# Patient Record
Sex: Male | Born: 1950
Health system: Southern US, Community
[De-identification: ages and names within clinical notes are randomized; demographics above are authoritative.]

## PROBLEM LIST (undated history)

## (undated) ENCOUNTER — Emergency Department (HOSPITAL_BASED_OUTPATIENT_CLINIC_OR_DEPARTMENT_OTHER): Admission: EM | Payer: Medicare HMO

## (undated) DIAGNOSIS — F419 Anxiety disorder, unspecified: Secondary | ICD-10-CM

## (undated) DIAGNOSIS — I10 Essential (primary) hypertension: Secondary | ICD-10-CM

## (undated) DIAGNOSIS — Z8673 Personal history of transient ischemic attack (TIA), and cerebral infarction without residual deficits: Secondary | ICD-10-CM

## (undated) DIAGNOSIS — K5792 Diverticulitis of intestine, part unspecified, without perforation or abscess without bleeding: Secondary | ICD-10-CM

## (undated) DIAGNOSIS — B029 Zoster without complications: Secondary | ICD-10-CM

## (undated) DIAGNOSIS — G5601 Carpal tunnel syndrome, right upper limb: Secondary | ICD-10-CM

## (undated) DIAGNOSIS — R519 Headache, unspecified: Secondary | ICD-10-CM

## (undated) DIAGNOSIS — Z8719 Personal history of other diseases of the digestive system: Secondary | ICD-10-CM

## (undated) DIAGNOSIS — K602 Anal fissure, unspecified: Secondary | ICD-10-CM

## (undated) DIAGNOSIS — J309 Allergic rhinitis, unspecified: Secondary | ICD-10-CM

## (undated) DIAGNOSIS — M542 Cervicalgia: Secondary | ICD-10-CM

## (undated) DIAGNOSIS — K219 Gastro-esophageal reflux disease without esophagitis: Secondary | ICD-10-CM

## (undated) DIAGNOSIS — G629 Polyneuropathy, unspecified: Secondary | ICD-10-CM

## (undated) DIAGNOSIS — R51 Headache: Secondary | ICD-10-CM

## (undated) DIAGNOSIS — Z87442 Personal history of urinary calculi: Secondary | ICD-10-CM

## (undated) DIAGNOSIS — E039 Hypothyroidism, unspecified: Secondary | ICD-10-CM

## (undated) DIAGNOSIS — G8929 Other chronic pain: Secondary | ICD-10-CM

## (undated) DIAGNOSIS — G950 Syringomyelia and syringobulbia: Secondary | ICD-10-CM

## (undated) DIAGNOSIS — G4486 Cervicogenic headache: Secondary | ICD-10-CM

## (undated) DIAGNOSIS — K8020 Calculus of gallbladder without cholecystitis without obstruction: Secondary | ICD-10-CM

## (undated) DIAGNOSIS — S83207A Unspecified tear of unspecified meniscus, current injury, left knee, initial encounter: Secondary | ICD-10-CM

## (undated) DIAGNOSIS — M47812 Spondylosis without myelopathy or radiculopathy, cervical region: Secondary | ICD-10-CM

## (undated) DIAGNOSIS — N400 Enlarged prostate without lower urinary tract symptoms: Secondary | ICD-10-CM

## (undated) HISTORY — DX: Diverticulitis of intestine, part unspecified, without perforation or abscess without bleeding: K57.92

## (undated) HISTORY — DX: Hypothyroidism, unspecified: E03.9

## (undated) HISTORY — DX: Anal fissure, unspecified: K60.2

## (undated) HISTORY — DX: Cervicogenic headache: G44.86

## (undated) HISTORY — DX: Zoster without complications: B02.9

## (undated) HISTORY — DX: Headache: R51

## (undated) HISTORY — DX: Anxiety disorder, unspecified: F41.9

## (undated) HISTORY — DX: Calculus of gallbladder without cholecystitis without obstruction: K80.20

## (undated) HISTORY — DX: Other chronic pain: G89.29

## (undated) HISTORY — DX: Carpal tunnel syndrome, right upper limb: G56.01

## (undated) HISTORY — DX: Headache, unspecified: R51.9

## (undated) HISTORY — PX: COLONOSCOPY: SHX174

## (undated) HISTORY — PX: CARDIAC CATHETERIZATION: SHX172

---

## 1990-09-27 HISTORY — PX: HEMORRHOID SURGERY: SHX153

## 2000-01-07 ENCOUNTER — Ambulatory Visit (HOSPITAL_COMMUNITY): Admission: RE | Admit: 2000-01-07 | Discharge: 2000-01-07 | Payer: Self-pay | Admitting: Family Medicine

## 2000-01-07 ENCOUNTER — Encounter: Payer: Self-pay | Admitting: Family Medicine

## 2000-05-10 ENCOUNTER — Observation Stay (HOSPITAL_COMMUNITY): Admission: EM | Admit: 2000-05-10 | Discharge: 2000-05-11 | Payer: Self-pay | Admitting: Emergency Medicine

## 2000-05-10 ENCOUNTER — Encounter: Payer: Self-pay | Admitting: Urology

## 2000-05-10 ENCOUNTER — Encounter: Payer: Self-pay | Admitting: Emergency Medicine

## 2000-05-10 HISTORY — PX: OTHER SURGICAL HISTORY: SHX169

## 2001-04-30 ENCOUNTER — Emergency Department (HOSPITAL_COMMUNITY): Admission: EM | Admit: 2001-04-30 | Discharge: 2001-04-30 | Payer: Self-pay

## 2002-01-22 ENCOUNTER — Encounter: Admission: RE | Admit: 2002-01-22 | Discharge: 2002-02-05 | Payer: Self-pay | Admitting: Orthopedic Surgery

## 2003-01-02 ENCOUNTER — Ambulatory Visit (HOSPITAL_COMMUNITY): Admission: RE | Admit: 2003-01-02 | Discharge: 2003-01-02 | Payer: Self-pay | Admitting: Family Medicine

## 2003-01-02 ENCOUNTER — Encounter: Payer: Self-pay | Admitting: Family Medicine

## 2003-01-30 ENCOUNTER — Encounter (INDEPENDENT_AMBULATORY_CARE_PROVIDER_SITE_OTHER): Payer: Self-pay | Admitting: *Deleted

## 2003-01-30 ENCOUNTER — Ambulatory Visit (HOSPITAL_COMMUNITY): Admission: RE | Admit: 2003-01-30 | Discharge: 2003-01-31 | Payer: Self-pay | Admitting: *Deleted

## 2003-01-30 HISTORY — PX: LAPAROSCOPIC CHOLECYSTECTOMY: SUR755

## 2005-05-03 ENCOUNTER — Ambulatory Visit: Payer: Self-pay | Admitting: Internal Medicine

## 2005-06-15 ENCOUNTER — Ambulatory Visit: Payer: Self-pay | Admitting: Internal Medicine

## 2005-06-29 ENCOUNTER — Ambulatory Visit: Payer: Self-pay | Admitting: Gastroenterology

## 2005-07-14 ENCOUNTER — Ambulatory Visit: Payer: Self-pay | Admitting: Internal Medicine

## 2005-07-14 ENCOUNTER — Ambulatory Visit: Admission: RE | Admit: 2005-07-14 | Discharge: 2005-07-14 | Payer: Self-pay | Admitting: Internal Medicine

## 2005-07-23 ENCOUNTER — Ambulatory Visit: Payer: Self-pay | Admitting: Gastroenterology

## 2005-07-28 LAB — HM COLONOSCOPY

## 2005-08-02 ENCOUNTER — Ambulatory Visit: Payer: Self-pay | Admitting: Gastroenterology

## 2006-05-06 ENCOUNTER — Ambulatory Visit: Payer: Self-pay | Admitting: Cardiology

## 2009-03-24 ENCOUNTER — Encounter: Admission: RE | Admit: 2009-03-24 | Discharge: 2009-06-22 | Payer: Self-pay | Admitting: Family Medicine

## 2010-01-15 ENCOUNTER — Ambulatory Visit: Payer: Self-pay | Admitting: Surgery

## 2010-01-15 ENCOUNTER — Encounter (INDEPENDENT_AMBULATORY_CARE_PROVIDER_SITE_OTHER): Payer: Self-pay | Admitting: Emergency Medicine

## 2010-01-15 ENCOUNTER — Ambulatory Visit: Payer: Self-pay | Admitting: Cardiology

## 2010-01-15 ENCOUNTER — Observation Stay (HOSPITAL_COMMUNITY): Admission: EM | Admit: 2010-01-15 | Discharge: 2010-01-16 | Payer: Self-pay | Admitting: Emergency Medicine

## 2010-01-16 ENCOUNTER — Encounter (INDEPENDENT_AMBULATORY_CARE_PROVIDER_SITE_OTHER): Payer: Self-pay | Admitting: Emergency Medicine

## 2010-01-16 HISTORY — PX: TRANSTHORACIC ECHOCARDIOGRAM: SHX275

## 2010-02-17 ENCOUNTER — Observation Stay (HOSPITAL_COMMUNITY): Admission: EM | Admit: 2010-02-17 | Discharge: 2010-02-19 | Payer: Self-pay | Admitting: Emergency Medicine

## 2010-02-17 ENCOUNTER — Ambulatory Visit: Payer: Self-pay | Admitting: Cardiovascular Disease

## 2010-08-03 ENCOUNTER — Ambulatory Visit (HOSPITAL_COMMUNITY)
Admission: RE | Admit: 2010-08-03 | Discharge: 2010-08-03 | Payer: Self-pay | Source: Home / Self Care | Admitting: Nephrology

## 2010-11-24 DIAGNOSIS — R519 Headache, unspecified: Secondary | ICD-10-CM | POA: Insufficient documentation

## 2010-11-24 DIAGNOSIS — G479 Sleep disorder, unspecified: Secondary | ICD-10-CM | POA: Insufficient documentation

## 2010-11-24 DIAGNOSIS — I679 Cerebrovascular disease, unspecified: Secondary | ICD-10-CM | POA: Insufficient documentation

## 2010-11-24 DIAGNOSIS — R51 Headache: Secondary | ICD-10-CM | POA: Insufficient documentation

## 2010-11-24 DIAGNOSIS — G950 Syringomyelia and syringobulbia: Secondary | ICD-10-CM | POA: Insufficient documentation

## 2010-11-24 DIAGNOSIS — M47812 Spondylosis without myelopathy or radiculopathy, cervical region: Secondary | ICD-10-CM | POA: Insufficient documentation

## 2010-11-24 DIAGNOSIS — R209 Unspecified disturbances of skin sensation: Secondary | ICD-10-CM | POA: Insufficient documentation

## 2010-12-11 ENCOUNTER — Emergency Department (HOSPITAL_COMMUNITY): Payer: 59

## 2010-12-11 ENCOUNTER — Emergency Department (HOSPITAL_COMMUNITY)
Admission: EM | Admit: 2010-12-11 | Discharge: 2010-12-12 | Disposition: A | Discharge status: Home / Self Care | Payer: 59 | Attending: Emergency Medicine | Admitting: Emergency Medicine

## 2010-12-11 DIAGNOSIS — R279 Unspecified lack of coordination: Secondary | ICD-10-CM | POA: Insufficient documentation

## 2010-12-11 DIAGNOSIS — R42 Dizziness and giddiness: Secondary | ICD-10-CM | POA: Insufficient documentation

## 2010-12-11 DIAGNOSIS — Z8673 Personal history of transient ischemic attack (TIA), and cerebral infarction without residual deficits: Secondary | ICD-10-CM | POA: Insufficient documentation

## 2010-12-11 DIAGNOSIS — Z79899 Other long term (current) drug therapy: Secondary | ICD-10-CM | POA: Insufficient documentation

## 2010-12-11 DIAGNOSIS — E039 Hypothyroidism, unspecified: Secondary | ICD-10-CM | POA: Insufficient documentation

## 2010-12-11 DIAGNOSIS — I10 Essential (primary) hypertension: Secondary | ICD-10-CM | POA: Insufficient documentation

## 2010-12-11 DIAGNOSIS — Z7982 Long term (current) use of aspirin: Secondary | ICD-10-CM | POA: Insufficient documentation

## 2010-12-11 DIAGNOSIS — K219 Gastro-esophageal reflux disease without esophagitis: Secondary | ICD-10-CM | POA: Insufficient documentation

## 2010-12-11 HISTORY — DX: Essential (primary) hypertension: I10

## 2010-12-11 LAB — CBC
HCT: 45.4 % (ref 39.0–52.0)
Hemoglobin: 15.9 g/dL (ref 13.0–17.0)
MCH: 32.2 pg (ref 26.0–34.0)
MCHC: 35 g/dL (ref 30.0–36.0)
MCV: 91.9 fL (ref 78.0–100.0)
Platelets: 193 10*3/uL (ref 150–400)
RBC: 4.94 MIL/uL (ref 4.22–5.81)
RDW: 12.3 % (ref 11.5–15.5)
WBC: 6.5 K/uL (ref 4.0–10.5)

## 2010-12-11 LAB — BASIC METABOLIC PANEL
BUN: 12 mg/dL (ref 6–23)
CO2: 29 mEq/L (ref 19–32)
Chloride: 107 mEq/L (ref 96–112)
Creatinine, Ser: 1.28 mg/dL (ref 0.4–1.5)
Glucose, Bld: 111 mg/dL — ABNORMAL HIGH (ref 70–99)

## 2010-12-11 LAB — DIFFERENTIAL
Basophils Absolute: 0.1 K/uL (ref 0.0–0.1)
Basophils Relative: 1 % (ref 0–1)
Eosinophils Absolute: 0.2 10*3/uL (ref 0.0–0.7)
Eosinophils Relative: 2 % (ref 0–5)
Lymphocytes Relative: 31 % (ref 12–46)
Lymphs Abs: 2 10*3/uL (ref 0.7–4.0)
Monocytes Absolute: 0.7 K/uL (ref 0.1–1.0)
Monocytes Relative: 10 % (ref 3–12)
Neutro Abs: 3.7 K/uL (ref 1.7–7.7)
Neutrophils Relative %: 56 % (ref 43–77)

## 2010-12-11 LAB — APTT: aPTT: 28 s (ref 24–37)

## 2010-12-11 LAB — BASIC METABOLIC PANEL WITH GFR
Calcium: 9.1 mg/dL (ref 8.4–10.5)
GFR calc Af Amer: >60 mL/min (ref >60)
GFR calc non Af Amer: 58 mL/min — ABNORMAL LOW (ref >60)
Potassium: 3.8 meq/L (ref 3.5–5.1)
Sodium: 141 meq/L (ref 135–145)

## 2010-12-11 LAB — PROTIME-INR
INR: 0.99 (ref 0.00–1.49)
Prothrombin Time: 13.3 s (ref 11.6–15.2)

## 2010-12-12 ENCOUNTER — Encounter (HOSPITAL_COMMUNITY): Payer: Self-pay | Admitting: Radiology

## 2010-12-12 MED ORDER — IOHEXOL 350 MG/ML SOLN
50.0000 mL | Freq: Once | INTRAVENOUS | Status: AC | PRN
  Administered 2010-12-12: 50 mL via INTRAVENOUS

## 2010-12-14 LAB — BASIC METABOLIC PANEL
BUN: 14 mg/dL (ref 6–23)
CO2: 28 mEq/L (ref 19–32)
Calcium: 9 mg/dL (ref 8.4–10.5)
Calcium: 9.3 mg/dL (ref 8.4–10.5)
Creatinine, Ser: 1.36 mg/dL (ref 0.4–1.5)
Creatinine, Ser: 1.39 mg/dL (ref 0.4–1.5)
GFR calc non Af Amer: 52 mL/min — ABNORMAL LOW (ref >60)
GFR calc non Af Amer: 54 mL/min — ABNORMAL LOW (ref >60)
Glucose, Bld: 101 mg/dL — ABNORMAL HIGH (ref 70–99)
Glucose, Bld: 122 mg/dL — ABNORMAL HIGH (ref 70–99)
Potassium: 4 mEq/L (ref 3.5–5.1)
Sodium: 139 mEq/L (ref 135–145)
Sodium: 142 mEq/L (ref 135–145)

## 2010-12-14 LAB — DIFFERENTIAL
Basophils Absolute: 0 10*3/uL (ref 0.0–0.1)
Basophils Relative: 1 % (ref 0–1)
Monocytes Absolute: 0.6 10*3/uL (ref 0.1–1.0)
Neutro Abs: 5.4 10*3/uL (ref 1.7–7.7)
Neutrophils Relative %: 65 % (ref 43–77)

## 2010-12-14 LAB — CBC
HCT: 48.4 % (ref 39.0–52.0)
Hemoglobin: 16.5 g/dL (ref 13.0–17.0)
Hemoglobin: 16.6 g/dL (ref 13.0–17.0)
Hemoglobin: 17.6 g/dL — ABNORMAL HIGH (ref 13.0–17.0)
MCHC: 34.9 g/dL (ref 30.0–36.0)
MCHC: 35.2 g/dL (ref 30.0–36.0)
MCV: 99 fL (ref 78.0–100.0)
RBC: 4.78 MIL/uL (ref 4.22–5.81)
RDW: 12.6 % (ref 11.5–15.5)
WBC: 8.9 10*3/uL (ref 4.0–10.5)

## 2010-12-14 LAB — TROPONIN I: Troponin I: 0.01 ng/mL (ref 0.00–0.06)

## 2010-12-14 LAB — POCT CARDIAC MARKERS: CKMB, poc: <1 ng/mL — ABNORMAL LOW (ref 1.0–8.0)

## 2010-12-14 LAB — PROTIME-INR: INR: 1.01 (ref 0.00–1.49)

## 2010-12-14 LAB — TSH: TSH: 10.241 u[IU]/mL — ABNORMAL HIGH (ref 0.350–4.500)

## 2010-12-14 LAB — CARDIAC PANEL(CRET KIN+CKTOT+MB+TROPI)
Relative Index: 0.8 (ref 0.0–2.5)
Relative Index: 0.9 (ref 0.0–2.5)
Total CK: 120 U/L (ref 7–232)
Troponin I: 0.01 ng/mL (ref 0.00–0.06)

## 2010-12-14 LAB — LIPID PANEL
Cholesterol: 168 mg/dL (ref 0–200)
HDL: 36 mg/dL — ABNORMAL LOW (ref >39)

## 2010-12-14 LAB — HEPARIN LEVEL (UNFRACTIONATED): Heparin Unfractionated: 0.31 IU/mL (ref 0.30–0.70)

## 2010-12-14 LAB — CK TOTAL AND CKMB (NOT AT ARMC): Relative Index: 0.8 (ref 0.0–2.5)

## 2010-12-15 LAB — COMPREHENSIVE METABOLIC PANEL
AST: 19 U/L (ref 0–37)
BUN: 10 mg/dL (ref 6–23)
CO2: 27 mEq/L (ref 19–32)
Chloride: 104 mEq/L (ref 96–112)
Creatinine, Ser: 1.37 mg/dL (ref 0.4–1.5)
GFR calc non Af Amer: 53 mL/min — ABNORMAL LOW (ref >60)
Glucose, Bld: 102 mg/dL — ABNORMAL HIGH (ref 70–99)
Total Bilirubin: 1.5 mg/dL — ABNORMAL HIGH (ref 0.3–1.2)

## 2010-12-15 LAB — URINALYSIS, ROUTINE W REFLEX MICROSCOPIC
Hgb urine dipstick: NEGATIVE
Nitrite: NEGATIVE
Specific Gravity, Urine: 1.01 (ref 1.005–1.030)
Urobilinogen, UA: 0.2 mg/dL (ref 0.0–1.0)
pH: 7 (ref 5.0–8.0)

## 2010-12-15 LAB — DIFFERENTIAL
Basophils Absolute: 0 10*3/uL (ref 0.0–0.1)
Basophils Relative: 0 % (ref 0–1)
Lymphocytes Relative: 17 % (ref 12–46)
Monocytes Relative: 9 % (ref 3–12)
Neutro Abs: 7.3 10*3/uL (ref 1.7–7.7)
Neutrophils Relative %: 74 % (ref 43–77)

## 2010-12-15 LAB — CK TOTAL AND CKMB (NOT AT ARMC)
CK, MB: 2.1 ng/mL (ref 0.3–4.0)
Relative Index: 1.5 (ref 0.0–2.5)
Total CK: 139 U/L (ref 7–232)

## 2010-12-15 LAB — BASIC METABOLIC PANEL
CO2: 33 mEq/L — ABNORMAL HIGH (ref 19–32)
Calcium: 10.2 mg/dL (ref 8.4–10.5)
Chloride: 113 mEq/L — ABNORMAL HIGH (ref 96–112)
Creatinine, Ser: 1.34 mg/dL (ref 0.4–1.5)
GFR calc non Af Amer: 55 mL/min — ABNORMAL LOW (ref >60)
Potassium: 4.4 mEq/L (ref 3.5–5.1)

## 2010-12-15 LAB — CBC
Hemoglobin: 16.7 g/dL (ref 13.0–17.0)
MCHC: 34.9 g/dL (ref 30.0–36.0)
RBC: 4.91 MIL/uL (ref 4.22–5.81)

## 2010-12-15 LAB — GLUCOSE, CAPILLARY: Glucose-Capillary: 119 mg/dL — ABNORMAL HIGH (ref 70–99)

## 2010-12-15 LAB — LIPID PANEL
Cholesterol: 157 mg/dL (ref 0–200)
LDL Cholesterol: 98 mg/dL (ref 0–99)
Triglycerides: 123 mg/dL (ref <150)

## 2010-12-15 LAB — APTT: aPTT: 27 seconds (ref 24–37)

## 2010-12-15 LAB — TROPONIN I: Troponin I: 0.01 ng/mL (ref 0.00–0.06)

## 2011-01-18 ENCOUNTER — Other Ambulatory Visit (INDEPENDENT_AMBULATORY_CARE_PROVIDER_SITE_OTHER): Payer: 59

## 2011-01-18 ENCOUNTER — Encounter: Payer: Self-pay | Admitting: Endocrinology

## 2011-01-18 ENCOUNTER — Ambulatory Visit (INDEPENDENT_AMBULATORY_CARE_PROVIDER_SITE_OTHER): Payer: 59 | Admitting: Endocrinology

## 2011-01-18 DIAGNOSIS — E039 Hypothyroidism, unspecified: Secondary | ICD-10-CM

## 2011-01-18 DIAGNOSIS — I10 Essential (primary) hypertension: Secondary | ICD-10-CM

## 2011-01-18 DIAGNOSIS — K219 Gastro-esophageal reflux disease without esophagitis: Secondary | ICD-10-CM | POA: Insufficient documentation

## 2011-01-18 DIAGNOSIS — G459 Transient cerebral ischemic attack, unspecified: Secondary | ICD-10-CM | POA: Insufficient documentation

## 2011-01-18 NOTE — Patient Instructions (Addendum)
A thyroid blood test are being ordered for you today.  please call [phone] to hear your test results. Here is a letter to request a 24-hr urine to check for 'adrenaline."  Please try to do on a day when you are most likely to have the symptoms, or high-blood pressure.  You may need to do this several times in order to find the problem, if you have it.  Return here as needed. (update: i left message on phone-tree:  rx as we discussed)

## 2011-01-18 NOTE — Progress Notes (Signed)
  Subjective:    Patient ID: Jacob Kerr, male    DOB: [birth year], 60 y.o.   MRN: [id number]  HPI Pt states approx 20 years h/o hypothyroidism.  The synthroid was most recently increased approx 1 month ago.  Pt states 1 year ago, he had a 20-minute episode of visual loss in both eyes, and assoc htn.  The Crist Infante has not recurred, but he continues to have labile htn.  Past Medical History  Diagnosis Date  . Hypertension   . TIA (transient ischemic attack)    Past Surgical History  Procedure Date  . Cholecystectomy 1998?  Marland Kitchen Hemorrhoid surgery 1992  . Kidney stone surgery 2001    reports that he has never smoked. He does not have any smokeless tobacco history on file. He reports that he drinks alcohol. He reports that he does not use illicit drugs. 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 Allergies  Allergen Reactions  . Sudafed (Pseudoephedrine Hcl)     Increased heart rate    Review of Systems denies flushing, pallor, n/v, syncope, diarrhea, weight loss, sob, birthmarks, palpitations, fever, sob, easy bruising, and rhinorrhea.  He has intermittent headache (no help with labetalol).  He had a syncopal episode a few mos ago.  He has intermittent chest pain (cardiac w/u was neg).  He has anxiety and excessive diaphoresis.  He has chronic low-back pain.       Objective:   Physical Exam VS: see vs page GEN: no distress HEAD: head: no deformity eyes: no periorbital swelling, no proptosis external nose and ears are normal mouth: no lesion seen NECK: supple, thyroid is not enlarged CHEST WALL: no deformity BREASTS:  No gynecomastia CV: reg rate and rhythm, no murmur ABD: abdomen is soft, nontender.  no hepatosplenomegaly.  not distended.  no hernia MUSCULOSKELETAL: muscle bulk  and strength are grossly normal.  no obvious joint swelling.  gait is normal and steady EXTEMITIES: no deformity.  no ulcer on the feet.  feet are of normal color and temp.  no edema PULSES: dorsalis pedis intact bilat.  no carotid bruit NEURO:  cn 2-12 grossly intact.   readily moves all 4's.  sensation is intact to touch on the feet.  No tremor.  SKIN:  Normal texture and temperature.  No rash or suspicious lesion is visible.  There is mild vitiligo on the hands.  Not diaphoretic. NODES:  None palpable at the neck PSYCH: alert, oriented x3.  Does not appear anxious nor depressed  Lab Results  Component Value Date   TSH 0.92 01/18/2011      Assessment & Plan:  Hypothyroidism.   Well-replaced. Htn, labile.  Possibly due to anxiety. Headache, uncertain etiology

## 2011-02-12 NOTE — Op Note (Signed)
[name], Jacob Kerr               ACCOUNT NO.:  [id number]   MEDICAL RECORD NO.:  [id number]          PATIENT TYPE:  OUT   LOCATION:  CARD                         FACILITY:  [facility]   PHYSICIAN:  Oley Balm. Sung Amabile, M.D. [facility] OF BIRTH:  06/08/1951   DATE OF PROCEDURE:  07/14/2005  DATE OF DISCHARGE:  07/14/2005                                 OPERATIVE REPORT   PROCEDURE:  Cardiopulmonary stress test.   INDICATION FOR TESTING:  Dyspnea.   DESCRIPTION OF PROCEDURE:  Cardiopulmonary stress testing was for performed  on a graded treadmill. Testing was stopped due to chest pain and heart rate  goal. Effort was maximal. At peak exercise, oxygen uptake was 31.2  mL/kg/minute or 101% of predicted maximum indicating normal exercise  tolerance.   At peak exercise, heart rate was 175 beats per minute or 104% of predicted  maximum indicating that cardiovascular limitation was reached. Oxygen pulse  was normal suggesting normal stroke volume. Blood pressure response was  normal. EKG tracings revealed no arrhythmias. There were nonspecific ST and  T-wave abnormalities noted during exercise.   At peak exercise, minute ventilation was 105 liters per minute or 71% of  maximum voluntary ventilation indicating that ventilatory reserve remained.  Gas exchange parameters revealed no abnormalities. Baseline spirometry  revealed mild obstruction. Postexercise spirometry revealed no exercise-  induced bronchospasm.   SUMMARY:  Normal exercise tolerance. Nonspecific EKG tracings changes as  noted above. Mild obstruction on spirometry. Otherwise this is a normal  cardiopulmonary stress test. However, it is notable that the patient did  experience chest pain during testing. Clinical correlation is recommended.           ______________________________  Oley Balm. Sung Amabile, M.D. [facility]     DBS/MEDQ  D:  08/24/2005  T:  08/25/2005  Job:  [id number]   cc:   Charlaine Dalton. Sherene Sires, M.D. [facility]  [address]  [place]  [place] [address]   Cardiopulmonary Dept.

## 2011-02-12 NOTE — Op Note (Signed)
   NAMEMASIAH, Jacob Kerr                         ACCOUNT NO.:  [id number]   MEDICAL RECORD NO.:  [id number]                   PATIENT TYPE:  OIB   LOCATION:  5712                                 FACILITY:  [facility]   PHYSICIAN:  Vikki Ports, M.D.         DATE OF BIRTH:  Sep 17, 1951   DATE OF PROCEDURE:  01/30/2003  DATE OF DISCHARGE:                                 OPERATIVE REPORT   PREOPERATIVE DIAGNOSIS:  1. Symptomatic cholelithiasis.  2. INTRAVENOUS PYELOGRAM DYE ALLERGY.   POSTOPERATIVE DIAGNOSES:  1. Symptomatic cholelithiasis.  2. INTRAVENOUS PYELOGRAM DYE ALLERGY.   PROCEDURE:  Laparoscopic cholecystectomy without cholangiogram secondary to  the patient's severe allergy.   ANESTHESIA:  General.   SURGEON:  Vikki Ports, M.D.   ASSISTANT:  Anselm Pancoast. Zachery Dakins, M.D.   DESCRIPTION OF PROCEDURE:  The patient was taken to the operating room and  placed in the supine position.  After adequate anesthesia was induced using  endotracheal tube, the abdomen was prepped and draped in the normal sterile  fashion.  Using a transverse infraumbilical incision, I dissected down to  the fascia.  The fascia was opened vertically.  An 0 Vicryl pursestring  suture was placed around the fascial defect and a Hasson trocar was placed  in the abdomen.  A pneumoperitoneum was obtained and under direct  visualization, a 10 mm port was placed in the subxiphoid region and two 5 mm  ports were placed in the right abdomen.  The gallbladder was identified and  retracted cephalad.  Dissection began near the infundibulum of the  gallbladder and carried down to the neck, where the cystic duct was easily  visualized.  A good window was created posterior to it.  Its junction with  the common duct was also visualized.  It was triply clipped and divided.  A  single cystic artery was also identified, dissected free of surrounding  structures, triply clipped, and divided.  A number of  adhesions were taken  down off the gallbladder wall and the gallbladder was moved off the  gallbladder bed using Bovie electrocautery and removed through the umbilical  port.  Adequate hemostasis was ensured.  Incisions were injected using  Marcaine.  The infraumbilical fascial defect was closed with the 0 Vicryl  pursestring suture.  Pneumoperitoneum was released.  Incisions were injected  with Marcaine and closed with 4-0 subcuticular Monocryl.  Steri-Strips and  sterile dressings were applied.  The patient tolerated the procedure well  and went to PACU in gastric outlet obstruction.                                                Vikki Ports, M.D.    KRH/MEDQ  D:  01/30/2003  T:  01/30/2003  Job:  [id number]

## 2011-02-12 NOTE — Op Note (Signed)
[place]  Patient:    Jacob Kerr, Jacob Kerr                      MRN: [id number] Proc. Date: 05/10/00 Adm. Date:  [id number] Attending:  Lauree Chandler                           Operative Report  PREOPERATIVE DIAGNOSIS:  Distal right ureteral calculus.  POSTOPERATIVE DIAGNOSIS:  Distal right ureteral calculus.  OPERATION PERFORMED:  Cystoscopy, right ureteroscopy and ureteroscopic stone manipulation and removal, right retrograde pyelogram with interpretation and right double-J catheter placement.  SURGEON:  Maretta Bees. Vonita Moss, M.D.  ANESTHESIA:  General.  INDICATIONS FOR PROCEDURE:  This 60 year old gentleman has had recurrent right flank pain [place] and presented to the emergency room today with severe right flank pain and a CT scan showed a 3 mm stone in the distal right ureter with hydronephrosis and pyelocaliceal backflow and rupture.  He had low-grade fever and a low white count and the patient was desirous of having the stone removed at this time because of his severe recurrent symptoms.  DESCRIPTION OF PROCEDURE:  The patient was brought to the operating room and placed in lithotomy position after the induction of satisfactory general anesthesia.  The 3 mm stone seen on KUB in the distal right ureter was seen on fluoroscopy.  The cystoscope was inserted under direct vision.  The anterior urethra was unremarkable.  The prostate was small, the bladder was normal.  A metal guide wire with a softened tip was placed up the right ureter past the stone without difficulty.  The ureteral orifice looked fairly small, so I felt that a balloon dilation was necessary, so a 4 cm ureteral balloon dilator was utilized for inflating this intramural and distal ureter for three minutes at 12 atmospheres of pressure.   With the guide wire in position, I then inserted a 6 French short rigid  ureteroscope and negotiated the intramural ureter and came upon his round, black stone at which point I used the Segura stone basket to grasp the stone and remove it intact and the stone was later given to the patients wife.  At this point I injected the contrast through the ureteroscope and there was some fullness of the upper ureter and mild pyelocaliectasis.  With the guide wire still in place, I backloaded the guide wire into the cystoscope and then inserted a 6 French 28 cm double-J catheter that had a full coil in the renal pelvis and a full coil in the bladder.  The string was brought out per urethra and taped to the penis.  The patient was taken to the recovery room in good condition having tolerated the procedure well. DD:  05/10/00 TD:  05/11/00 Job: [id number]

## 2011-02-16 ENCOUNTER — Telehealth: Payer: Self-pay | Admitting: *Deleted

## 2011-02-16 NOTE — Telephone Encounter (Signed)
Message copied by Brenton Grills on Tue Feb 16, 2011  1:36 PM ------      Message from: Cristy Hilts      Created: Fri Feb 12, 2011  4:10 PM                   ----- Message -----         From: Sheffield Slider, RN         Sent: 02/08/2011  11:46 AM           To: Jacelyn Pi, RN                        ----- Message -----         From: Minus Breeding, MD         Sent: 01/18/2011   3:55 PM           To: Doristine Devoid, CMA            please leave message on phone tree--normal

## 2011-02-16 NOTE — Telephone Encounter (Signed)
Pt informed of results via VM (left detailed message of results and to callback office with any questions or concerns.

## 2011-09-28 HISTORY — PX: OTHER SURGICAL HISTORY: SHX169

## 2012-08-30 LAB — HEPATIC FUNCTION PANEL: AST: 19 U/L (ref 14–40)

## 2012-09-02 LAB — HEMOGLOBIN A1C: Hgb A1c MFr Bld: 5.6 % (ref 4.0–6.0)

## 2012-09-02 LAB — LIPID PANEL: Triglycerides: 130 mg/dL (ref 40–160)

## 2012-11-03 ENCOUNTER — Encounter: Payer: Self-pay | Admitting: Nurse Practitioner

## 2012-11-03 DIAGNOSIS — N400 Enlarged prostate without lower urinary tract symptoms: Secondary | ICD-10-CM

## 2012-11-03 DIAGNOSIS — G629 Polyneuropathy, unspecified: Secondary | ICD-10-CM

## 2012-11-03 DIAGNOSIS — K5731 Diverticulosis of large intestine without perforation or abscess with bleeding: Secondary | ICD-10-CM

## 2013-01-01 ENCOUNTER — Ambulatory Visit (INDEPENDENT_AMBULATORY_CARE_PROVIDER_SITE_OTHER): Payer: 59 | Admitting: Family Medicine

## 2013-01-01 ENCOUNTER — Encounter: Payer: Self-pay | Admitting: Family Medicine

## 2013-01-01 ENCOUNTER — Other Ambulatory Visit: Payer: Self-pay | Admitting: *Deleted

## 2013-01-01 VITALS — BP 115/75 | HR 70 | Temp 97.4°F | Ht 71.0 in | Wt 208.0 lb

## 2013-01-01 DIAGNOSIS — R5381 Other malaise: Secondary | ICD-10-CM

## 2013-01-01 DIAGNOSIS — M2550 Pain in unspecified joint: Secondary | ICD-10-CM

## 2013-01-01 DIAGNOSIS — I10 Essential (primary) hypertension: Secondary | ICD-10-CM

## 2013-01-01 DIAGNOSIS — E785 Hyperlipidemia, unspecified: Secondary | ICD-10-CM | POA: Insufficient documentation

## 2013-01-01 DIAGNOSIS — Z125 Encounter for screening for malignant neoplasm of prostate: Secondary | ICD-10-CM

## 2013-01-01 DIAGNOSIS — G609 Hereditary and idiopathic neuropathy, unspecified: Secondary | ICD-10-CM

## 2013-01-01 DIAGNOSIS — K219 Gastro-esophageal reflux disease without esophagitis: Secondary | ICD-10-CM

## 2013-01-01 DIAGNOSIS — E349 Endocrine disorder, unspecified: Secondary | ICD-10-CM

## 2013-01-01 DIAGNOSIS — G629 Polyneuropathy, unspecified: Secondary | ICD-10-CM

## 2013-01-01 DIAGNOSIS — E291 Testicular hypofunction: Secondary | ICD-10-CM

## 2013-01-01 DIAGNOSIS — E559 Vitamin D deficiency, unspecified: Secondary | ICD-10-CM

## 2013-01-01 LAB — POCT CBC
Granulocyte percent: 64.4 %G (ref 37–80)
HCT, POC: 51.1 % (ref 43.5–53.7)
POC Granulocyte: 3.5 (ref 2–6.9)
RDW, POC: 12.9 %
WBC: 5.4 10*3/uL (ref 4.6–10.2)

## 2013-01-01 LAB — BASIC METABOLIC PANEL
Glucose, Bld: 114 mg/dL — ABNORMAL HIGH (ref 70–99)
Potassium: 4.4 mEq/L (ref 3.5–5.3)
Sodium: 139 mEq/L (ref 135–145)

## 2013-01-01 LAB — HEPATIC FUNCTION PANEL
Bilirubin, Direct: 0.2 mg/dL (ref 0.0–0.3)
Indirect Bilirubin: 1.3 mg/dL — ABNORMAL HIGH (ref 0.0–0.9)
Total Bilirubin: 1.5 mg/dL — ABNORMAL HIGH (ref 0.3–1.2)

## 2013-01-01 NOTE — Addendum Note (Signed)
Addended by: Roselyn Reef on: 01/01/2013 09:14 AM   Modules accepted: Orders

## 2013-01-01 NOTE — Progress Notes (Signed)
  Subjective:    Patient ID: Jacob Kerr, male    DOB: October 28, 1950, 62 y.o.   MRN: [id number]  HPI  This patient presents for recheck of multiple medical problems. No one accompanies the patient today.  Patient Active Problem List  Diagnosis  . Unspecified hypothyroidism  . Unspecified essential hypertension  . Unspecified transient cerebral ischemia  . GERD (gastroesophageal reflux disease)  . BPH (benign prostatic hyperplasia)  . Diverticulosis large intestine w/o perforation or abscess w/bleeding  . Peripheral neuropathy  . Vitamin D deficiency  . Hyperlipidemia    In addition, see review of systems  The allergies, current medications, past medical history, surgical history, family and social history are reviewed.  Immunizations reviewed.  Health maintenance reviewed.  The following items are outstanding: Zostavax       Review of Systems  Constitutional: Negative.   HENT: Positive for neck pain.   Eyes: Negative.   Respiratory: Negative.   Cardiovascular: Negative.   Gastrointestinal: Negative.   Genitourinary: Negative.   Musculoskeletal: Positive for back pain (neck) and arthralgias (R shoulder).  Neurological: Negative.   Psychiatric/Behavioral: Negative.   Neck pain 5/10 Androderm helping Minimal neuropathy right arm and foot Right shoulder blade pain     Objective:   Physical Exam BP 115/75  Pulse 70  Temp(Src) 97.4 F (36.3 C) (Oral)  Ht 5\' 11"  (1.803 m)  Wt 208 lb (94.348 kg)  BMI 29.02 kg/m2  The patient appeared well nourished and normally developed, alert and oriented to time and place. Speech, behavior and judgement appear normal. Vital signs as documented.  Head exam is unremarkable. No scleral icterus or pallor noted. Some rhinitis bilaterally Neck is without jugular venous distension, thyromegally, or carotid bruits. Carotid upstrokes are brisk bilaterally. No cervical adenopathy. Lungs are clear anteriorly and posteriorly to  auscultation. Normal respiratory effort.No axillary adenopathy Cardiac exam reveals regular rate and rhythm @ 84/min. First and second heart sounds normal. No murmurs, rubs or gallops.  Abdominal exam reveals normal bowl sounds, no masses, no organomegaly and no aortic enlargement. No inguinal adenopathy.Prostate smooth and slightly enlarged. Testicles normal. No rectal masses..th Extremities are nonedematous and both femoral and pedal pulses are normal. Skin without pallor or jaundice.  Warm and dry, without rash. Neurologic exam reveals normal deep tendon reflexes and normal sensation.           Assessment & Plan:  1. Essential hypertension, benign - BASIC METABOLIC PANEL WITH GFR; Standing - NMR Lipoprofile with Lipids; Standing  2. Other and unspecified hyperlipidemia - Hepatic function panel; Standing  3. Testosterone deficiency  Psa and testosterone today and q 6 months - POCT CBC; Standing  4. Joint pain - Vitamin D 25 hydroxy; Standing  5. Peripheral neuropathy  6. GERD (gastroesophageal reflux disease) CBC  7. Unspecified essential hypertension BMP  8. Vitamin D deficiency  9. Hyperlipidemia NMR

## 2013-01-01 NOTE — Addendum Note (Signed)
Addended by: Monica Becton on: 01/01/2013 09:35 AM   Modules accepted: Orders

## 2013-01-01 NOTE — Addendum Note (Signed)
Addended by: Orma Render F on: 01/01/2013 09:27 AM   Modules accepted: Orders

## 2013-01-01 NOTE — Patient Instructions (Addendum)
FOBT already has at home, will return Continue current meds and therapeutic lifestyle changes Will schedule eye exam Needs Zostavax

## 2013-01-02 LAB — NMR LIPOPROFILE WITH LIPIDS
Cholesterol, Total: 180 mg/dL (ref <200)
LDL (calc): 115 mg/dL — ABNORMAL HIGH (ref <100)
LDL Particle Number: 1685 nmol/L — ABNORMAL HIGH (ref <1000)
LP-IR Score: 64 — ABNORMAL HIGH (ref <=45)
Large VLDL-P: 2.4 nmol/L (ref <=2.7)
Triglycerides: 158 mg/dL — ABNORMAL HIGH (ref <150)
VLDL Size: 43.4 nm (ref <=46.6)

## 2013-01-03 NOTE — Progress Notes (Signed)
Pt notified of results RX phoned into [facility] for Vit D

## 2013-02-28 ENCOUNTER — Telehealth: Payer: Self-pay | Admitting: Neurology

## 2013-02-28 DIAGNOSIS — G950 Syringomyelia and syringobulbia: Secondary | ICD-10-CM

## 2013-02-28 NOTE — Telephone Encounter (Signed)
This patient has a cervical cord syrinx. He has developed some worsening pain in weakness of the right arm and right leg. He also has sensory alteration on the right. The patient will be set up for MRI evaluation of the cervical spinal cord, and he will have a revisit. The patient is using a TENS unit on the right arm and shoulder.

## 2013-02-28 NOTE — Telephone Encounter (Signed)
Patient called wanting to speak with physician concerning having a MRI scan.

## 2013-03-13 ENCOUNTER — Encounter: Payer: Self-pay | Admitting: Family Medicine

## 2013-03-13 ENCOUNTER — Ambulatory Visit (INDEPENDENT_AMBULATORY_CARE_PROVIDER_SITE_OTHER): Payer: 59 | Admitting: Family Medicine

## 2013-03-13 VITALS — BP 138/86 | HR 67 | Temp 97.7°F | Wt 207.0 lb

## 2013-03-13 DIAGNOSIS — L259 Unspecified contact dermatitis, unspecified cause: Secondary | ICD-10-CM

## 2013-03-13 MED ORDER — METHYLPREDNISOLONE SODIUM SUCC 125 MG IJ SOLR: 125.0000 mg | Freq: Once | INTRAMUSCULAR | Status: DC

## 2013-03-13 MED ORDER — HYDROXYZINE HCL 25 MG PO TABS: 25.0000 mg | ORAL_TABLET | Freq: Three times a day (TID) | ORAL | Status: DC | PRN

## 2013-03-13 MED ORDER — METHYLPREDNISOLONE SODIUM SUCC 125 MG IJ SOLR
125.0000 mg | Freq: Once | INTRAMUSCULAR | Status: AC
  Administered 2013-03-13: 125 mg via INTRAMUSCULAR

## 2013-03-13 MED ORDER — METHYLPREDNISOLONE 4 MG PO KIT: PACK | ORAL | Status: DC

## 2013-03-13 NOTE — Progress Notes (Signed)
  Subjective:    Patient ID: ANCHOR DWAN, male    DOB: 1951/08/18, 62 y.o.   MRN: [id number]  HPI This 62 y.o. male presents for evaluation of rash over back and legs after being bitten by mosquitos yesterday.  He is c/o severe pruritis.     Review of Systems  Constitutional: Negative.   HENT: Negative.   Eyes: Negative.   Respiratory: Negative.   Cardiovascular: Negative.   Gastrointestinal: Negative.   Skin: Positive for rash.        Objective:   Physical Exam  Constitutional: He appears well-developed and well-nourished.  HENT:  Head: Normocephalic and atraumatic.  Right Ear: External ear normal.  Left Ear: External ear normal.  Mouth/Throat: Oropharynx is clear and moist.  Eyes: Conjunctivae are normal. Pupils are equal, round, and reactive to light.  Neck: Normal range of motion. Neck supple.  Cardiovascular: Normal rate and regular rhythm.   Pulmonary/Chest: Effort normal and breath sounds normal.  Skin: Rash noted. There is erythema.  Uriticaria appearing rash over back and legs bilatera.          Assessment & Plan:  Contact dermatitis Solumedrol 125mg  IM today,  Start medrol dose pack tomorrow, atarax 25mg  po tid prn pruritis. Follow up prn if sx's persist.

## 2013-03-13 NOTE — Patient Instructions (Signed)

## 2013-03-21 ENCOUNTER — Ambulatory Visit
Admission: RE | Admit: 2013-03-21 | Discharge: 2013-03-21 | Disposition: A | Discharge status: Home / Self Care | Payer: 59 | Source: Ambulatory Visit | Attending: Neurology | Admitting: Neurology

## 2013-03-21 DIAGNOSIS — R209 Unspecified disturbances of skin sensation: Secondary | ICD-10-CM

## 2013-03-21 DIAGNOSIS — G950 Syringomyelia and syringobulbia: Secondary | ICD-10-CM

## 2013-03-25 ENCOUNTER — Telehealth: Payer: Self-pay | Admitting: Neurology

## 2013-03-25 NOTE — Telephone Encounter (Signed)
I called patient. The patient indicates that he is having right arm and right leg discomfort, with pain into the big toe. MRI of the cervical spine shows multilevel spondylosis, with neuroforaminal stenosis that is significant at the C3-4 and C4-5 levels, but multiple levels are involved. The patient reports pain into the medial aspect of the right shoulder blade, and some pressure sensations into the back of the head. The patient likely has some spasm. The patient is being followed by pain Center, and he received an occipital nerve injection which did help some. The patient does have some slight enlargement of a very small spinal cord syrinx, but I doubt this is causing his current symptoms. I suggested an epidural steroid injection, but the patient does not wish to consider this. I'll try to get him in for a revisit.

## 2013-03-28 ENCOUNTER — Ambulatory Visit (INDEPENDENT_AMBULATORY_CARE_PROVIDER_SITE_OTHER): Payer: 59 | Admitting: Neurology

## 2013-03-28 ENCOUNTER — Encounter: Payer: Self-pay | Admitting: Neurology

## 2013-03-28 VITALS — BP 125/76 | HR 64 | Ht 70.25 in | Wt 202.0 lb

## 2013-03-28 DIAGNOSIS — I679 Cerebrovascular disease, unspecified: Secondary | ICD-10-CM

## 2013-03-28 DIAGNOSIS — G950 Syringomyelia and syringobulbia: Secondary | ICD-10-CM

## 2013-03-28 DIAGNOSIS — R209 Unspecified disturbances of skin sensation: Secondary | ICD-10-CM

## 2013-03-28 DIAGNOSIS — R51 Headache: Secondary | ICD-10-CM

## 2013-03-28 DIAGNOSIS — G479 Sleep disorder, unspecified: Secondary | ICD-10-CM

## 2013-03-28 DIAGNOSIS — M47812 Spondylosis without myelopathy or radiculopathy, cervical region: Secondary | ICD-10-CM

## 2013-03-28 MED ORDER — NORTRIPTYLINE HCL 10 MG PO CAPS: ORAL_CAPSULE | ORAL | Status: DC

## 2013-03-28 NOTE — Progress Notes (Signed)
Reason for visit: Neck discomfort  Jacob Kerr is an 62 y.o. male  History of present illness:  Jacob Kerr is a 62 year old right-handed white male with a history of a small spinal cord syrinx at the C4-C7 levels, and cervical spondylosis. The patient reports a two-year history of gradually worsening discomfort in the right neck, shoulder, with pain radiating into the medial shoulder blade area. The patient denies pain down the arm, and he denies weakness of the arm. The patient has some restriction of cervical spine mobility. The patient has pressure sensations in the base of the skull bilaterally, right greater than left. The patient indicates that he is sleeping fairly well however. The patient is noting a gradual worsening of the pain, and he comes in to this office for an evaluation. The patient in the past has not wished to pursue epidural steroid injections of the cervical spine.  Past Medical History  Diagnosis Date  . Hypertension   . TIA (transient ischemic attack)   . Degenerative disc disease   . Diverticulitis   . Gastritis   . Hypothyroidism   . Benign enlargement of prostate   . Nephrolithiasis   . Cerebrovascular disease   . Cervicogenic headache   . Drowsiness     Excessive daytime drowsiness  . Bilateral occipital neuralgia     Past Surgical History  Procedure Laterality Date  . Cholecystectomy  1998?  Marland Kitchen Hemorrhoid surgery  1992  . Kidney stone surgery  2001    Family History  Problem Relation Age of Onset  . Cancer Father     Prostate Cancer  . Heart disease Father   . Stroke Father   . Nephrolithiasis Sister   . Cancer Sister     Lung Cancer  . Hyperlipidemia Other     FH  . Heart disease Other     FH  . Stroke Other     FH  . Kidney disease Other   . Hypertension Other     FH  . Diabetes Other     Parent, Other Relative  . Heart disease Mother   . Cancer Sister     sinus  . COPD Sister   . Hyperlipidemia Sister   . Hyperlipidemia  Sister     Social history:  reports that he has never smoked. He does not have any smokeless tobacco history on file. He reports that he drinks about 0.6 ounces of alcohol per week. He reports that he does not use illicit drugs.  Allergies:  Allergies  Allergen Reactions  . Sudafed (Pseudoephedrine Hcl)     Increased heart rate  . Epinephrine Palpitations    Medications:  Current Outpatient Prescriptions on File Prior to Visit  Medication Sig Dispense Refill  . ACETYLCARN-ALPHA LIPOIC ACID PO Take 1 tablet by mouth daily.      . Acetylcarnitine HCl (ACETYL-L-CARNITINE HCL) POWD Take 1 capsule by mouth daily.      Marland Kitchen amLODipine (NORVASC) 5 MG tablet Take 5 mg by mouth daily.        Marland Kitchen aspirin 81 MG tablet Take 81 mg by mouth daily.        . Coenzyme Q10 (CO Q 10) 100 MG CAPS Take 1 capsule by mouth daily.      Marland Kitchen levothyroxine (SYNTHROID, LEVOTHROID) 175 MCG tablet Take 175 mcg by mouth daily.        Marland Kitchen olmesartan (BENICAR) 40 MG tablet Take 40 mg by mouth daily.        Marland Kitchen  omeprazole (PRILOSEC) 20 MG capsule Take 20 mg by mouth 2 (two) times daily.       Marland Kitchen testosterone (ANDRODERM) 4 MG/24HR PT24 patch Place 1 patch onto the skin daily.       No current facility-administered medications on file prior to visit.    ROS:  Out of a complete 14 system review of symptoms, the patient complains only of the following symptoms, and all other reviewed systems are negative.  Ringing in the ears Confusion, headache, numbness, weakness Neck and shoulder discomfort  Blood pressure 125/76, pulse 64, height 5' 10.25" (1.784 m), weight 202 lb (91.627 kg).  Physical Exam  General: The patient is alert and cooperative at the time of the examination.  Neuromuscular: The patient lacks about 10-15 of lateral rotation of the cervical spine bilaterally.  Skin: No significant peripheral edema is noted.   Neurologic Exam  Cranial nerves: Facial symmetry is present. Speech is normal, no aphasia or  dysarthria is noted. Extraocular movements are full. Visual fields are full.  Motor: The patient has good strength in all 4 extremities.  Coordination: The patient has good finger-nose-finger and heel-to-shin bilaterally.  Gait and station: The patient has a normal gait. Tandem gait is normal. Romberg is negative. No drift is seen.  Reflexes: Deep tendon reflexes are symmetric.   Assessment/Plan:  1. Cervical spondylosis  2. Cervicogenic headache  3. Cervical spinal cord syrinx  The MRI done recently of the cervical spine does show some slight enlargement of the cervical cord syrinx, but this likely is asymptomatic. The patient has evidence of neuroforaminal stenosis on the right at the C3-4 and C4-5 levels which is the likely source of his underlying chronic cervical spasm and cervicogenic headache. The patient will be set up for physical therapy for neuromuscular therapy, and he will be placed on low-dose nortriptyline. The patient will followup in 4 months. The patient may seek treatment as well through a chiropractor. The patient may benefit from cervical traction.  Marlan Palau MD 03/28/2013 9:20 PM  [facility] [address] [place], [place] [address]  Phone [phone] Fax [phone]

## 2013-04-16 ENCOUNTER — Ambulatory Visit: Payer: 59 | Attending: Neurology | Admitting: Physical Therapy

## 2013-04-16 DIAGNOSIS — IMO0001 Reserved for inherently not codable concepts without codable children: Secondary | ICD-10-CM | POA: Insufficient documentation

## 2013-04-16 DIAGNOSIS — M542 Cervicalgia: Secondary | ICD-10-CM | POA: Insufficient documentation

## 2013-04-16 DIAGNOSIS — M2560 Stiffness of unspecified joint, not elsewhere classified: Secondary | ICD-10-CM | POA: Insufficient documentation

## 2013-04-16 DIAGNOSIS — R5381 Other malaise: Secondary | ICD-10-CM | POA: Insufficient documentation

## 2013-04-18 ENCOUNTER — Ambulatory Visit: Payer: 59 | Admitting: *Deleted

## 2013-04-24 ENCOUNTER — Ambulatory Visit: Payer: 59 | Admitting: Physical Therapy

## 2013-04-26 ENCOUNTER — Ambulatory Visit: Payer: 59 | Admitting: Physical Therapy

## 2013-05-01 ENCOUNTER — Ambulatory Visit: Payer: 59 | Attending: Neurology | Admitting: Physical Therapy

## 2013-05-01 DIAGNOSIS — IMO0001 Reserved for inherently not codable concepts without codable children: Secondary | ICD-10-CM | POA: Insufficient documentation

## 2013-05-01 DIAGNOSIS — R5381 Other malaise: Secondary | ICD-10-CM | POA: Insufficient documentation

## 2013-05-01 DIAGNOSIS — M542 Cervicalgia: Secondary | ICD-10-CM | POA: Insufficient documentation

## 2013-05-01 DIAGNOSIS — M2560 Stiffness of unspecified joint, not elsewhere classified: Secondary | ICD-10-CM | POA: Insufficient documentation

## 2013-05-03 ENCOUNTER — Ambulatory Visit: Payer: 59 | Admitting: Physical Therapy

## 2013-05-08 ENCOUNTER — Ambulatory Visit: Payer: 59

## 2013-05-10 ENCOUNTER — Ambulatory Visit: Payer: 59

## 2013-05-14 ENCOUNTER — Ambulatory Visit: Payer: 59 | Admitting: Physical Therapy

## 2013-05-16 ENCOUNTER — Ambulatory Visit: Payer: 59 | Admitting: Physical Therapy

## 2013-05-22 ENCOUNTER — Ambulatory Visit: Payer: 59 | Admitting: Physical Therapy

## 2013-05-24 ENCOUNTER — Ambulatory Visit: Payer: 59

## 2013-06-08 ENCOUNTER — Ambulatory Visit (INDEPENDENT_AMBULATORY_CARE_PROVIDER_SITE_OTHER): Payer: 59 | Admitting: *Deleted

## 2013-06-08 DIAGNOSIS — M549 Dorsalgia, unspecified: Secondary | ICD-10-CM

## 2013-06-08 LAB — POCT UA - MICROSCOPIC ONLY
Bacteria, U Microscopic: NEGATIVE
Casts, Ur, LPF, POC: NEGATIVE
Crystals, Ur, HPF, POC: NEGATIVE
WBC, Ur, HPF, POC: NEGATIVE
Yeast, UA: NEGATIVE

## 2013-06-08 LAB — POCT URINALYSIS DIPSTICK
Bilirubin, UA: NEGATIVE
Glucose, UA: NEGATIVE
Ketones, UA: NEGATIVE
Leukocytes, UA: NEGATIVE
Nitrite, UA: NEGATIVE
Protein, UA: NEGATIVE
Spec Grav, UA: 1.015
Urobilinogen, UA: NEGATIVE
pH, UA: 5

## 2013-06-16 NOTE — Progress Notes (Signed)
Quick Note:  Call patient. Labs normal. No change in plan. ______ 

## 2013-07-02 ENCOUNTER — Other Ambulatory Visit: Payer: Self-pay

## 2013-07-02 NOTE — Telephone Encounter (Signed)
Last seen 03/13/13  B [place]  If approved print for mail order and route to nurse

## 2013-07-04 ENCOUNTER — Encounter: Payer: Self-pay | Admitting: Family Medicine

## 2013-07-04 ENCOUNTER — Ambulatory Visit (INDEPENDENT_AMBULATORY_CARE_PROVIDER_SITE_OTHER): Payer: 59 | Admitting: Family Medicine

## 2013-07-04 VITALS — BP 148/87 | HR 62 | Temp 97.4°F | Ht 70.25 in | Wt 204.0 lb

## 2013-07-04 DIAGNOSIS — R51 Headache: Secondary | ICD-10-CM

## 2013-07-04 DIAGNOSIS — E291 Testicular hypofunction: Secondary | ICD-10-CM

## 2013-07-04 DIAGNOSIS — I10 Essential (primary) hypertension: Secondary | ICD-10-CM

## 2013-07-04 DIAGNOSIS — E785 Hyperlipidemia, unspecified: Secondary | ICD-10-CM

## 2013-07-04 DIAGNOSIS — Z23 Encounter for immunization: Secondary | ICD-10-CM

## 2013-07-04 DIAGNOSIS — E039 Hypothyroidism, unspecified: Secondary | ICD-10-CM

## 2013-07-04 DIAGNOSIS — N400 Enlarged prostate without lower urinary tract symptoms: Secondary | ICD-10-CM

## 2013-07-04 DIAGNOSIS — K219 Gastro-esophageal reflux disease without esophagitis: Secondary | ICD-10-CM

## 2013-07-04 DIAGNOSIS — G629 Polyneuropathy, unspecified: Secondary | ICD-10-CM

## 2013-07-04 DIAGNOSIS — E559 Vitamin D deficiency, unspecified: Secondary | ICD-10-CM

## 2013-07-04 DIAGNOSIS — G609 Hereditary and idiopathic neuropathy, unspecified: Secondary | ICD-10-CM

## 2013-07-04 DIAGNOSIS — E349 Endocrine disorder, unspecified: Secondary | ICD-10-CM | POA: Insufficient documentation

## 2013-07-04 LAB — POCT CBC
Granulocyte percent: 65.2 % (ref 37–80)
HCT, POC: 50.2 % (ref 43.5–53.7)
Hemoglobin: 17.1 g/dL (ref 14.1–18.1)
Lymph, poc: 1.9 (ref 0.6–3.4)
MCH, POC: 32.1 pg — AB (ref 27–31.2)
MCHC: 34.1 g/dL (ref 31.8–35.4)
MCV: 94.2 fL (ref 80–97)
MPV: 8.7 fL (ref 0–99.8)
POC Granulocyte: 3.8 (ref 2–6.9)
POC LYMPH PERCENT: 33.1 % (ref 10–50)
Platelet Count, POC: 221 10*3/uL (ref 142–424)
RBC: 5.3 M/uL (ref 4.69–6.13)
RDW, POC: 13 %
WBC: 5.8 10*3/uL (ref 4.6–10.2)

## 2013-07-04 LAB — POCT URINALYSIS DIPSTICK
Ketones, UA: NEGATIVE
Leukocytes, UA: NEGATIVE
Nitrite, UA: NEGATIVE
Protein, UA: NEGATIVE
pH, UA: 5

## 2013-07-04 LAB — POCT UA - MICROSCOPIC ONLY
Casts, Ur, LPF, POC: NEGATIVE
Crystals, Ur, HPF, POC: NEGATIVE
Mucus, UA: NEGATIVE
Yeast, UA: NEGATIVE

## 2013-07-04 MED ORDER — TESTOSTERONE 4 MG/24HR TD PT24: 1.0000 | MEDICATED_PATCH | Freq: Every day | TRANSDERMAL | Status: DC

## 2013-07-04 NOTE — Progress Notes (Signed)
Subjective:    Patient ID: Jacob Kerr, male    DOB: 01/11/1951, 62 y.o.   MRN: [id number]  HPI Pt here for follow up and management of chronic medical problems. Health maintenance today in addition to having lab work done the patient will get a PSA, prostate,   and he will return his FOBT. He will also get a flu shot and Prevnar today. He is due to shingle shot and this will be done in one month. Patient sees the neurologist about every 6 months for his syringomyelia. He has some right flank pain not associated with kidney stones especially notable when he arises from a sitting position. A recent chest x-ray from December 13 was reviewed and this was completely within normal limits. See also the review of systems     Patient Active Problem List   Diagnosis Date Noted  . Cervicogenic headache 03/28/2013  . Vitamin D deficiency 01/01/2013  . Hyperlipidemia 01/01/2013  . BPH (benign prostatic hyperplasia) 11/03/2012  . Diverticulosis large intestine w/o perforation or abscess w/bleeding 11/03/2012  . Peripheral neuropathy 11/03/2012  . Unspecified hypothyroidism 01/18/2011  . Unspecified essential hypertension 01/18/2011  . Unspecified transient cerebral ischemia 01/18/2011  . GERD (gastroesophageal reflux disease) 01/18/2011  . Cervical spondylosis without myelopathy 11/24/2010  . Headache(784.0) 11/24/2010  . Sleep disturbance, unspecified 11/24/2010  . Syringomyelia and syringobulbia 11/24/2010  . Disturbance of skin sensation 11/24/2010  . Cerebrovascular disease, unspecified 11/24/2010   Outpatient Encounter Prescriptions as of 07/04/2013  Medication Sig Dispense Refill  . ACETYLCARN-ALPHA LIPOIC ACID PO Take 1 tablet by mouth daily.      Marland Kitchen amLODipine (NORVASC) 5 MG tablet Take 5 mg by mouth daily.        Marland Kitchen aspirin 81 MG tablet Take 81 mg by mouth daily.        . Coenzyme Q10 (CO Q 10) 100 MG CAPS Take 1 capsule by mouth daily.      Marland Kitchen levothyroxine (SYNTHROID, LEVOTHROID)  175 MCG tablet Take 175 mcg by mouth daily.        . nortriptyline (PAMELOR) 10 MG capsule One tablet at night for one week, then take 2 tablets at night  60 capsule  3  . olmesartan (BENICAR) 40 MG tablet Take 40 mg by mouth daily.        Marland Kitchen omeprazole (PRILOSEC) 20 MG capsule Take 20 mg by mouth 2 (two) times daily.       . [DISCONTINUED] testosterone (ANDRODERM) 4 MG/24HR PT24 patch Place 1 patch onto the skin daily.      . [DISCONTINUED] Acetylcarnitine HCl (ACETYL-L-CARNITINE HCL) POWD Take 1 capsule by mouth daily.      . [DISCONTINUED] Alpha-Lipoic Acid 100 MG CAPS Take 100 mg by mouth daily.      . [DISCONTINUED] Vitamin D, Ergocalciferol, (DRISDOL) 50000 UNITS CAPS Take 50,000 Units by mouth once a week.       No facility-administered encounter medications on file as of 07/04/2013.    Review of Systems  Constitutional: Negative.   Eyes: Negative.   Respiratory: Negative.   Cardiovascular: Negative.   Gastrointestinal: Negative.   Endocrine: Negative.   Genitourinary: Negative.   Musculoskeletal: Positive for myalgias (right mid-back pain).  Skin: Negative.   Allergic/Immunologic: Negative.   Neurological: Positive for headaches (low head and neck pain- everyday).  Hematological: Negative.   Psychiatric/Behavioral: Negative.        Objective:   Physical Exam  Nursing note and vitals reviewed. Constitutional: He is oriented to  person, place, and time. He appears well-developed and well-nourished. No distress.  HENT:  Head: Normocephalic and atraumatic.  Right Ear: External ear normal.  Left Ear: External ear normal.  Nose: Nose normal.  Mouth/Throat: Oropharynx is clear and moist. No oropharyngeal exudate.  Eyes: Conjunctivae and EOM are normal. Pupils are equal, round, and reactive to light. Right eye exhibits no discharge. Left eye exhibits no discharge. No scleral icterus.  Neck: Normal range of motion. Neck supple. No thyromegaly present.  There were no bruits in the  neck  Cardiovascular: Normal rate, regular rhythm, normal heart sounds and intact distal pulses.  Exam reveals no gallop and no friction rub.   No murmur heard. At 72 per minute  Pulmonary/Chest: Effort normal and breath sounds normal. No respiratory distress. He has no wheezes. He has no rales.  Abdominal: Soft. Bowel sounds are normal. He exhibits no mass. There is no tenderness. There is no rebound and no guarding.  Genitourinary: Rectum normal and penis normal.  The prostate gland was slightly enlarged left greater than right. There were no labs. There were no rectal masses . There was no hernia.  Musculoskeletal: Normal range of motion. He exhibits no edema and no tenderness.  Lymphadenopathy:    He has no cervical adenopathy.  Neurological: He is alert and oriented to person, place, and time. He has normal reflexes. No cranial nerve deficit.  Skin: Skin is warm and dry. No rash noted. No erythema. No pallor.  Psychiatric: He has a normal mood and affect. His behavior is normal. Judgment and thought content normal.   BP 148/87  Pulse 62  Temp(Src) 97.4 F (36.3 C) (Oral)  Ht 5' 10.25" (1.784 m)  Wt 204 lb (92.534 kg)  BMI 29.07 kg/m2 Repeat blood pressure in the right arm was 136/84.       Assessment & Plan:   1. Unspecified essential hypertension   2. Cervicogenic headache   3. Unspecified hypothyroidism   4. GERD (gastroesophageal reflux disease)   5. BPH (benign prostatic hyperplasia)   6. Hyperlipidemia   7. Vitamin D deficiency   8. Peripheral neuropathy   9. Testosterone deficiency    Orders Placed This Encounter  Procedures  . BMP8+EGFR  . Hepatic function panel  . Testosterone,Free and Total  . PSA, total and free  . NMR, lipoprofile  . Vit D  25 hydroxy (rtn osteoporosis monitoring)  . POCT CBC  . POCT urinalysis dipstick  . POCT UA - Microscopic Only   No orders of the defined types were placed in this encounter.   Patient Instructions  Continue  current medications. Continue good therapeutic lifestyle changes.  Fall precautions discussed with patient. Follow up as planned and earlier as needed.  Flu shot and Prevnar today, shingles shot in 1 month Do not forget to get your eye exam Bring back FOBT    Nyra Capes MD

## 2013-07-04 NOTE — Patient Instructions (Addendum)
Continue current medications. Continue good therapeutic lifestyle changes.  Fall precautions discussed with patient. Follow up as planned and earlier as needed.  Flu shot and Prevnar today, shingles shot in 1 month Do not forget to get your eye exam Bring back FOBT

## 2013-07-07 LAB — BMP8+EGFR
CO2: 26 mmol/L (ref 18–29)
Calcium: 9.4 mg/dL (ref 8.6–10.2)
Chloride: 101 mmol/L (ref 97–108)
Creatinine, Ser: 1.22 mg/dL (ref 0.76–1.27)
GFR calc non Af Amer: 64 mL/min/{1.73_m2} (ref >59)
Glucose: 113 mg/dL — ABNORMAL HIGH (ref 65–99)
Potassium: 4.2 mmol/L (ref 3.5–5.2)
Sodium: 141 mmol/L (ref 134–144)

## 2013-07-07 LAB — NMR, LIPOPROFILE
Cholesterol: 175 mg/dL (ref <200)
HDL Particle Number: 29.3 umol/L — ABNORMAL LOW (ref >=30.5)
LDL Size: 20.4 nm — ABNORMAL LOW (ref >20.5)
LDLC SERPL CALC-MCNC: 104 mg/dL — ABNORMAL HIGH (ref <100)
LP-IR Score: 55 — ABNORMAL HIGH (ref <=45)
Small LDL Particle Number: 917 nmol/L — ABNORMAL HIGH (ref <=527)
Triglycerides by NMR: 163 mg/dL — ABNORMAL HIGH (ref <150)

## 2013-07-07 LAB — HEPATIC FUNCTION PANEL
ALT: 30 IU/L (ref 0–44)
AST: 23 IU/L (ref 0–40)
Total Bilirubin: 1.4 mg/dL — ABNORMAL HIGH (ref 0.0–1.2)

## 2013-07-07 LAB — PSA, TOTAL AND FREE: PSA: 0.2 ng/mL (ref 0.0–4.0)

## 2013-07-07 LAB — TESTOSTERONE,FREE AND TOTAL
Testosterone, Free: 7 pg/mL (ref 6.6–18.1)
Testosterone: 435 ng/dL (ref 348–1197)

## 2013-08-28 ENCOUNTER — Ambulatory Visit (INDEPENDENT_AMBULATORY_CARE_PROVIDER_SITE_OTHER): Payer: 59 | Admitting: Family Medicine

## 2013-08-28 ENCOUNTER — Ambulatory Visit (INDEPENDENT_AMBULATORY_CARE_PROVIDER_SITE_OTHER): Payer: 59

## 2013-08-28 VITALS — BP 118/70 | HR 67 | Temp 98.4°F

## 2013-08-28 DIAGNOSIS — M549 Dorsalgia, unspecified: Secondary | ICD-10-CM

## 2013-08-28 DIAGNOSIS — M545 Low back pain, unspecified: Secondary | ICD-10-CM

## 2013-08-28 DIAGNOSIS — M25562 Pain in left knee: Secondary | ICD-10-CM

## 2013-08-28 DIAGNOSIS — M25569 Pain in unspecified knee: Secondary | ICD-10-CM

## 2013-08-28 MED ORDER — PREDNISONE 50 MG PO TABS: 50.0000 mg | ORAL_TABLET | Freq: Every day | ORAL | Status: DC

## 2013-09-04 ENCOUNTER — Other Ambulatory Visit: Payer: Self-pay | Admitting: Family Medicine

## 2013-09-04 ENCOUNTER — Ambulatory Visit (HOSPITAL_COMMUNITY)
Admission: RE | Admit: 2013-09-04 | Discharge: 2013-09-04 | Disposition: A | Discharge status: Home / Self Care | Payer: 59 | Source: Ambulatory Visit | Attending: Family Medicine | Admitting: Family Medicine

## 2013-09-04 DIAGNOSIS — M25562 Pain in left knee: Secondary | ICD-10-CM

## 2013-09-04 DIAGNOSIS — M25569 Pain in unspecified knee: Secondary | ICD-10-CM | POA: Insufficient documentation

## 2013-09-05 NOTE — Progress Notes (Signed)
   Subjective:    Patient ID: Jacob Kerr, male    DOB: [birth year], 62 y.o.   MRN: [id number]  HPI Pt presents today with chief complaint of L knee pain  Has had mild recurrent knee pain over several years.  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  Pain in anterior and posterior knee  No swelling.  Pain worse with knee bending.  No knee locking/giving away.  Neurovascularly intact distally   Has also had some mild LBP Some radiation in L buttocks No distal numbness or paresthesias.  Worse with back flexion and extension.  No bowel/bladder incontinence.   Review of Systems  All other systems reviewed and are negative.       Objective:   Physical Exam  Constitutional: He appears well-developed and well-nourished.  HENT:  Head: Normocephalic and atraumatic.  Eyes: Conjunctivae are normal. Pupils are equal, round, and reactive to light.  Neck: Normal range of motion.  Cardiovascular: Normal rate and regular rhythm.   Pulmonary/Chest: Effort normal and breath sounds normal.  Abdominal: Soft.  Musculoskeletal:       Legs: Mild L anterior knee pain with resisted knee extension Mild painful patellar compression Mild pain with McMurrays maneuver + popliteal TTP, mild   Mild TTP in l-s spine FABER negative     Neurological: He is alert.  Skin: Skin is warm.    [facility] reading (PRIMARY) by  Dr. Alvester Morin  Knee xrays preliminarily negative for any fracture or dislocation. Mild degenerative changes noted.                                         Assessment & Plan:  Back pain - Plan: DG Lumbar Spine 2-3 Views  Knee pain, acute, left - Plan: DG Knee 1-2 Views Left, CANCELED: [place] Misc Soft Tissue  Suspect mild knee sprain with secondary lumbosacral strain.  Will place on short course of prednisone  Will also send for LE [place] to rule baker's cyst as source of pain.  Wells score <1.  Discussed general and MSK red flags.  Follow up as needed.

## 2013-09-07 ENCOUNTER — Ambulatory Visit (INDEPENDENT_AMBULATORY_CARE_PROVIDER_SITE_OTHER): Payer: 59 | Admitting: Neurology

## 2013-09-07 ENCOUNTER — Encounter: Payer: Self-pay | Admitting: Neurology

## 2013-09-07 VITALS — BP 140/80 | Wt 202.0 lb

## 2013-09-07 DIAGNOSIS — R51 Headache: Secondary | ICD-10-CM

## 2013-09-07 DIAGNOSIS — M47812 Spondylosis without myelopathy or radiculopathy, cervical region: Secondary | ICD-10-CM

## 2013-09-07 DIAGNOSIS — G609 Hereditary and idiopathic neuropathy, unspecified: Secondary | ICD-10-CM

## 2013-09-07 DIAGNOSIS — G629 Polyneuropathy, unspecified: Secondary | ICD-10-CM

## 2013-09-07 MED ORDER — CYCLOBENZAPRINE HCL 10 MG PO TABS: 10.0000 mg | ORAL_TABLET | Freq: Every day | ORAL | Status: DC

## 2013-09-07 NOTE — Progress Notes (Signed)
Reason for visit: Cervical spondylosis  Jacob Kerr is an 62 y.o. male  History of present illness:  Jacob Kerr is a 62 year old right-handed white male with a history of cervical spondylosis and a history of a spinal cord syrinx at the C4-C7 levels. The patient has had ongoing neck stiffness, decreased range of movement of the neck, and pain into the right scapular area. The patient denies any pain down the arms. The patient was set up for neuromuscular therapy, and this seemed to help transiently. The patient tries to stretch intermittently. The patient was placed on nortriptyline, but he cannot tolerate the medication. The patient recently went on prednisone for arthritis in the left knee. This did not help his neck discomfort. The patient denies any weakness of the arms or legs, and he denies any change in bowel or bladder control. The patient has not had any falls. The patient is noting some increased gait instability, and he has to be quite careful with walking and with work activities. His job requires that he work above his head, often on a ladder, and this may worsen the pain in the neck and shoulders. Occasionally, when the neck issues worsen, the ringing in his ears worsens, and he may have discomfort into the right maxillary area.  Past Medical History  Diagnosis Date  . Hypertension   . TIA (transient ischemic attack)   . Degenerative disc disease   . Diverticulitis   . Gastritis   . Hypothyroidism   . Benign enlargement of prostate   . Nephrolithiasis   . Cerebrovascular disease   . Cervicogenic headache   . Drowsiness     Excessive daytime drowsiness  . Bilateral occipital neuralgia     Past Surgical History  Procedure Laterality Date  . Cholecystectomy  1998?  Marland Kitchen Hemorrhoid surgery  1992  . Kidney stone surgery  2001    Family History  Problem Relation Age of Onset  . Cancer Father     Prostate Cancer  . Heart disease Father   . Stroke Father   .  Nephrolithiasis Sister   . Cancer Sister     Lung Cancer  . Hyperlipidemia Other     FH  . Heart disease Other     FH  . Stroke Other     FH  . Kidney disease Other   . Hypertension Other     FH  . Diabetes Other     Parent, Other Relative  . Heart disease Mother   . Cancer Sister     sinus  . COPD Sister   . Hyperlipidemia Sister   . Hyperlipidemia Sister     Social history:  reports that he has never smoked. He does not have any smokeless tobacco history on file. He reports that he drinks about 0.6 ounces of alcohol per week. He reports that he does not use illicit drugs.    Allergies  Allergen Reactions  . Sudafed [Pseudoephedrine Hcl]     Increased heart rate  . Epinephrine Palpitations    Medications:  Current Outpatient Prescriptions on File Prior to Visit  Medication Sig Dispense Refill  . ACETYLCARN-ALPHA LIPOIC ACID PO Take 1 tablet by mouth daily.      Marland Kitchen amLODipine (NORVASC) 5 MG tablet Take 5 mg by mouth daily.        Marland Kitchen aspirin 81 MG tablet Take 81 mg by mouth daily.        . Coenzyme Q10 (CO Q 10) 100  MG CAPS Take 1 capsule by mouth daily.      Marland Kitchen levothyroxine (SYNTHROID, LEVOTHROID) 175 MCG tablet Take 175 mcg by mouth daily.        Marland Kitchen olmesartan (BENICAR) 40 MG tablet Take 40 mg by mouth daily.        Marland Kitchen omeprazole (PRILOSEC) 20 MG capsule Take 20 mg by mouth 2 (two) times daily.       Marland Kitchen testosterone (ANDRODERM) 4 MG/24HR PT24 patch Place 1 patch onto the skin daily.  90 patch  0   No current facility-administered medications on file prior to visit.    ROS:  Out of a complete 14 system review of symptoms, the patient complains only of the following symptoms, and all other reviewed systems are negative.  Neck pain, ringing in the ears Dizziness, headache Joint pain, muscle cramps, difficulty walking  Blood pressure 140/80, weight 202 lb (91.627 kg).  Physical Exam  General: The patient is alert and cooperative at the time of the  examination.  Neuromuscular: The patient lacks about 20 of lateral rotation of the cervical spine bilaterally.  Skin: No significant peripheral edema is noted.   Neurologic Exam  Mental status: The patient is oriented x 3.  Cranial nerves: Facial symmetry is present. Speech is normal, no aphasia or dysarthria is noted. Extraocular movements are full. Visual fields are full.  Motor: The patient has good strength in all 4 extremities.  Sensory examination: Soft touch sensation on the face, arms, and legs is symmetric.  Coordination: The patient has good finger-nose-finger and heel-to-shin bilaterally.  Gait and station: The patient has a normal gait. Tandem gait is normal. Romberg is positive, the patient falls backwards. No drift is seen.  Reflexes: Deep tendon reflexes are symmetric.    MRI cervical spine result:  IMPRESSION:  Abnormal MRI cervical spine (without) demonstrating: 1. Syringomyelia extending from C4-5 to C7 (3.4cm in length, 4mm in diameter).  2. At C3-4: disc bulging and uncovertebral joint hypertrophy with severe biforaminal foraminal stenosis  3. At C4-5: uncovertebral joint hypertrophy with severe biforaminal foraminal stenosis  4. At C5-6: disc bulging with moderate left foraminal stenosis  5. At C6-7: facet hypertrophy with moderate biforaminal foraminal stenosis  6. Compared to MRI report from 02/11/10, there has been slight enlargement of syringomyelia.     Assessment/Plan:  One. Cervical spondylosis, chronic neck pain   2. Spinal cord syrinx  3. Mild gait disorder  The patient will be given a trial on Flexeril taking 10 mg at night. The patient could not tolerate nortriptyline. In the future, Amrix may be used. The patient will continue his regular stretching exercises, and he will followup in about 6 months. The patient has not wanted to get an epidural steroid injection in the past, and the use of oral prednisone recently did not help his  neck discomfort.  Marlan Palau MD 09/08/2013 11:34 AM  [facility] [address] [place], [place] [address]  Phone [phone] Fax [phone]

## 2013-09-11 ENCOUNTER — Telehealth: Payer: Self-pay | Admitting: *Deleted

## 2013-09-11 DIAGNOSIS — M25562 Pain in left knee: Secondary | ICD-10-CM

## 2013-09-11 NOTE — Telephone Encounter (Signed)
That would be totally fine Set up for ortho referral

## 2013-09-11 NOTE — Telephone Encounter (Signed)
Continued knee pain and weakness. Has developed popping as well. Wants to know if an MRI or ortho referral would be appropriate at this point.

## 2013-09-12 NOTE — Telephone Encounter (Signed)
Appt with ortho isn't until February.  Is it ok to order an MRI?

## 2013-09-12 NOTE — Telephone Encounter (Signed)
MRI would be fine. I didn't think it would take that long to see ortho.  If he is interested, he can see sports med in [place] prob within the next week or 2.  I would prefer a specialist order MRI so that he can have appropriate follow up on results.  But if he really wants MRI beforehand, that would be ok.

## 2013-09-14 ENCOUNTER — Other Ambulatory Visit: Payer: Self-pay | Admitting: Family Medicine

## 2013-10-12 ENCOUNTER — Ambulatory Visit: Payer: 59 | Admitting: Neurology

## 2013-11-06 ENCOUNTER — Encounter: Payer: Self-pay | Admitting: Family Medicine

## 2013-11-06 ENCOUNTER — Ambulatory Visit (INDEPENDENT_AMBULATORY_CARE_PROVIDER_SITE_OTHER): Payer: 59 | Admitting: Family Medicine

## 2013-11-06 VITALS — BP 152/94 | HR 73 | Temp 99.0°F | Ht 70.25 in | Wt 205.0 lb

## 2013-11-06 DIAGNOSIS — Z20828 Contact with and (suspected) exposure to other viral communicable diseases: Secondary | ICD-10-CM

## 2013-11-06 DIAGNOSIS — R51 Headache: Secondary | ICD-10-CM

## 2013-11-06 LAB — POCT INFLUENZA A/B
INFLUENZA A, POC: NEGATIVE
Influenza B, POC: NEGATIVE

## 2013-11-06 MED ORDER — OSELTAMIVIR PHOSPHATE 75 MG PO CAPS: 75.0000 mg | ORAL_CAPSULE | Freq: Two times a day (BID) | ORAL | Status: DC

## 2013-11-06 NOTE — Progress Notes (Signed)
   Subjective:    Patient ID: Jacob Kerr, male    DOB: [birth year], 63 y.o.   MRN: [id number]  HPI URI Symptoms Onset: 1-2 days  Description: rhinorrhea, nasal congestion, cough, body aches  Modifying factors:  + flu exposure   Symptoms Nasal discharge: yes Fever: no Sore throat: no Cough: yes Wheezing: no Ear pain: no GI symptoms: no Sick contacts: yes  Red Flags  Stiff neck: no Dyspnea: no Rash: no Swallowing difficulty: no  Sinusitis Risk Factors Headache/face pain: no Double sickening: no tooth pain: no  Allergy Risk Factors Sneezing: no Itchy scratchy throat: no Seasonal symptoms: no  Flu Risk Factors Headache: yes muscle aches: mild severe fatigue: mild      Review of Systems  All other systems reviewed and are negative.       Objective:   Physical Exam  Constitutional: He appears well-developed and well-nourished.  HENT:  Head: Normocephalic and atraumatic.  Right Ear: External ear normal.  Left Ear: External ear normal.  +nasal erythema, rhinorrhea bilaterally, + post oropharyngeal erythema    Eyes: Conjunctivae are normal. Pupils are equal, round, and reactive to light.  Neck: Normal range of motion. Neck supple.  Cardiovascular: Normal rate and regular rhythm.   Pulmonary/Chest: Effort normal and breath sounds normal.  Abdominal: Soft. Bowel sounds are normal.  Musculoskeletal: Normal range of motion.  Neurological: He is alert.  Skin: Skin is warm.          Assessment & Plan:  Headache(784.0) - Plan: POCT Influenza A/B  Exposure to the flu - Plan: oseltamivir (TAMIFLU) 75 MG capsule  Likely overall viral source of sxs  Flu negative Given borderline temp, will start on tamiflu given flu expsoure.  Discussed supportive care and infectious/resp red flags.  Follow up as needed.

## 2014-02-18 ENCOUNTER — Other Ambulatory Visit: Payer: Self-pay | Admitting: Orthopedic Surgery

## 2014-03-06 ENCOUNTER — Encounter (HOSPITAL_BASED_OUTPATIENT_CLINIC_OR_DEPARTMENT_OTHER): Payer: Self-pay | Admitting: *Deleted

## 2014-03-07 ENCOUNTER — Encounter (HOSPITAL_BASED_OUTPATIENT_CLINIC_OR_DEPARTMENT_OTHER): Payer: Self-pay | Admitting: *Deleted

## 2014-03-07 NOTE — Progress Notes (Signed)
NPO AFTER MN WITH EXCEPTION CLEAR LIQUIDS UNTIL 0700 (NO CREAM/  MILK PRODUCTS)  ARRIVE AT 1100. NEEDS ISTAT AND EKG. WILL TAKE PRILOSEC , SYNTHROID, AND BENICAR AM DOS W/ SIPS OF WATER.

## 2014-03-08 ENCOUNTER — Ambulatory Visit: Payer: 59 | Admitting: Neurology

## 2014-03-12 DIAGNOSIS — S83289A Other tear of lateral meniscus, current injury, unspecified knee, initial encounter: Secondary | ICD-10-CM | POA: Diagnosis present

## 2014-03-12 NOTE — H&P (Signed)
CC- Jacob Kerr is a 63 y.o. male who presents with left knee pain.  HPI- . Knee Pain: Patient presents with knee pain involving the  left knee. Onset of the symptoms was several months ago. Inciting event: none known. Current symptoms include pain located laterally. Pain is aggravated by lateral movements, pivoting, rising after sitting, standing and walking.  Patient has had prior knee problems. Evaluation to date: MRI: abnormal lateral meniscal tear. Treatment to date: rest.  Past Medical History  Diagnosis Date  . Hypertension   . Hypothyroidism   . Cerebrovascular disease   . Cervicogenic headache   . History of TIA (transient ischemic attack)     04/ 2011---  no residuals  . Acute meniscal tear of left knee   . BPH (benign prostatic hypertrophy)   . History of diverticulitis   . GERD (gastroesophageal reflux disease)   . Chronic neck pain     uses TENS unit  prn  . Decreased range of motion of neck   . Syrinx of spinal cord     C4 -- C7  . Allergic rhinitis   . Peripheral neuropathy   . Cervical spondylosis without myelopathy   . History of kidney stones   . Wears glasses   . H/O hiatal hernia     Past Surgical History  Procedure Laterality Date  . Hemorrhoid surgery  1992  . Laparoscopic cholecystectomy  01-30-2003  . Right ureteroscopic laser lithotripsy stone extraction /  stent placement  05-10-2000  . Cardiac catheterization  02-18-2010   dr Angelena Form    normal coronary arteries/  normal lvsf--  ef 65%  . Transthoracic echocardiogram  01-16-2010    normal lvf/  ef  60%/  mild lae  . Negative sleep study  2013    per pt  . Removal forgein body from ear  AGE 23    Prior to Admission medications   Medication Sig Start Date End Date Taking? Authorizing Provider  amLODipine (NORVASC) 5 MG tablet Take 5 mg by mouth every evening.    Yes Historical Provider, MD  aspirin 81 MG tablet Take 81 mg by mouth daily.     Yes Historical Provider, MD  Coenzyme Q10 (CO Q  10) 100 MG CAPS Take 1 capsule by mouth daily.   Yes Historical Provider, MD  cyclobenzaprine (FLEXERIL) 10 MG tablet Take 1 tablet (10 mg total) by mouth at bedtime. 09/07/13  Yes Kathrynn Ducking, MD  levothyroxine (SYNTHROID, LEVOTHROID) 175 MCG tablet Take 175 mcg by mouth daily before breakfast.    Yes Historical Provider, MD  olmesartan (BENICAR) 40 MG tablet Take 40 mg by mouth every morning.    Yes Historical Provider, MD  omeprazole (PRILOSEC) 20 MG capsule Take 20 mg by mouth every morning.    Yes Historical Provider, MD  testosterone (ANDRODERM) 4 MG/24HR PT24 patch Place 1 patch onto the skin daily. 07/02/13  Yes Lysbeth Penner, FNP   KNEE EXAM antalgic gait, soft tissue tenderness over lateral joint line, no effusion, negative drawer sign, negative pivot-shift, collateral ligaments intact  Physical Examination: General appearance - alert, well appearing, and in no distress Mental status - alert, oriented to person, place, and time Chest - clear to auscultation, no wheezes, rales or rhonchi, symmetric air entry Heart - normal rate, regular rhythm, normal S1, S2, no murmurs, rubs, clicks or gallops Abdomen - soft, nontender, nondistended, no masses or organomegaly Neurological - alert, oriented, normal speech, no focal findings or movement disorder  noted   Asessment/Plan--- Left knee lateral meniscal tear- - Plan left knee arthroscopy with meniscal debridement. Procedure risks and potential comps discussed with patient who elects to proceed. Goals are decreased pain and increased function with a high likelihood of achieving both

## 2014-03-13 ENCOUNTER — Encounter (HOSPITAL_BASED_OUTPATIENT_CLINIC_OR_DEPARTMENT_OTHER)
Admission: RE | Disposition: A | Discharge status: Home / Self Care | Payer: Self-pay | Source: Ambulatory Visit | Attending: Orthopedic Surgery

## 2014-03-13 ENCOUNTER — Ambulatory Visit (HOSPITAL_BASED_OUTPATIENT_CLINIC_OR_DEPARTMENT_OTHER): Payer: 59 | Admitting: Anesthesiology

## 2014-03-13 ENCOUNTER — Ambulatory Visit (HOSPITAL_BASED_OUTPATIENT_CLINIC_OR_DEPARTMENT_OTHER)
Admission: RE | Admit: 2014-03-13 | Discharge: 2014-03-13 | Disposition: A | Discharge status: Home / Self Care | Payer: 59 | Source: Ambulatory Visit | Attending: Orthopedic Surgery | Admitting: Orthopedic Surgery

## 2014-03-13 ENCOUNTER — Encounter (HOSPITAL_BASED_OUTPATIENT_CLINIC_OR_DEPARTMENT_OTHER): Payer: Self-pay | Admitting: *Deleted

## 2014-03-13 ENCOUNTER — Encounter (HOSPITAL_BASED_OUTPATIENT_CLINIC_OR_DEPARTMENT_OTHER): Payer: 59 | Admitting: Anesthesiology

## 2014-03-13 DIAGNOSIS — K219 Gastro-esophageal reflux disease without esophagitis: Secondary | ICD-10-CM | POA: Insufficient documentation

## 2014-03-13 DIAGNOSIS — Z79899 Other long term (current) drug therapy: Secondary | ICD-10-CM | POA: Insufficient documentation

## 2014-03-13 DIAGNOSIS — G609 Hereditary and idiopathic neuropathy, unspecified: Secondary | ICD-10-CM | POA: Insufficient documentation

## 2014-03-13 DIAGNOSIS — E039 Hypothyroidism, unspecified: Secondary | ICD-10-CM | POA: Insufficient documentation

## 2014-03-13 DIAGNOSIS — I10 Essential (primary) hypertension: Secondary | ICD-10-CM | POA: Insufficient documentation

## 2014-03-13 DIAGNOSIS — Z7982 Long term (current) use of aspirin: Secondary | ICD-10-CM | POA: Insufficient documentation

## 2014-03-13 DIAGNOSIS — S83289A Other tear of lateral meniscus, current injury, unspecified knee, initial encounter: Secondary | ICD-10-CM | POA: Diagnosis present

## 2014-03-13 DIAGNOSIS — M47812 Spondylosis without myelopathy or radiculopathy, cervical region: Secondary | ICD-10-CM | POA: Insufficient documentation

## 2014-03-13 DIAGNOSIS — M23302 Other meniscus derangements, unspecified lateral meniscus, unspecified knee: Secondary | ICD-10-CM | POA: Insufficient documentation

## 2014-03-13 DIAGNOSIS — Z8673 Personal history of transient ischemic attack (TIA), and cerebral infarction without residual deficits: Secondary | ICD-10-CM | POA: Insufficient documentation

## 2014-03-13 HISTORY — DX: Polyneuropathy, unspecified: G62.9

## 2014-03-13 HISTORY — DX: Syringomyelia and syringobulbia: G95.0

## 2014-03-13 HISTORY — DX: Gastro-esophageal reflux disease without esophagitis: K21.9

## 2014-03-13 HISTORY — DX: Unspecified tear of unspecified meniscus, current injury, left knee, initial encounter: S83.207A

## 2014-03-13 HISTORY — DX: Cervicalgia: M54.2

## 2014-03-13 HISTORY — DX: Other chronic pain: G89.29

## 2014-03-13 HISTORY — DX: Allergic rhinitis, unspecified: J30.9

## 2014-03-13 HISTORY — DX: Benign prostatic hyperplasia without lower urinary tract symptoms: N40.0

## 2014-03-13 HISTORY — DX: Personal history of transient ischemic attack (TIA), and cerebral infarction without residual deficits: Z86.73

## 2014-03-13 HISTORY — DX: Spondylosis without myelopathy or radiculopathy, cervical region: M47.812

## 2014-03-13 HISTORY — DX: Personal history of urinary calculi: Z87.442

## 2014-03-13 HISTORY — PX: KNEE ARTHROSCOPY: SHX127

## 2014-03-13 HISTORY — DX: Personal history of other diseases of the digestive system: Z87.19

## 2014-03-13 LAB — POCT I-STAT, CHEM 8
BUN: 12 mg/dL (ref 6–23)
CALCIUM ION: 1.27 mmol/L (ref 1.13–1.30)
CHLORIDE: 102 meq/L (ref 96–112)
Creatinine, Ser: 1.2 mg/dL (ref 0.50–1.35)
GLUCOSE: 107 mg/dL — AB (ref 70–99)
HCT: 55 % — ABNORMAL HIGH (ref 39.0–52.0)
Hemoglobin: 18.7 g/dL — ABNORMAL HIGH (ref 13.0–17.0)
Potassium: 4.1 mEq/L (ref 3.7–5.3)
Sodium: 142 mEq/L (ref 137–147)
TCO2: 24 mmol/L (ref 0–100)

## 2014-03-13 SURGERY — ARTHROSCOPY, KNEE
Anesthesia: General | Site: Knee | Laterality: Left

## 2014-03-13 MED ORDER — METHOCARBAMOL 500 MG PO TABS: 500.0000 mg | ORAL_TABLET | Freq: Four times a day (QID) | ORAL | Status: DC

## 2014-03-13 MED ORDER — ONDANSETRON HCL 4 MG/2ML IJ SOLN
INTRAMUSCULAR | Status: DC | PRN
  Administered 2014-03-13: 4 mg via INTRAVENOUS

## 2014-03-13 MED ORDER — LACTATED RINGERS IV SOLN
INTRAVENOUS | Status: DC
  Administered 2014-03-13: 12:00:00 via INTRAVENOUS
  Filled 2014-03-13: qty 1000

## 2014-03-13 MED ORDER — LACTATED RINGERS IV SOLN
INTRAVENOUS | Status: DC
  Filled 2014-03-13: qty 1000

## 2014-03-13 MED ORDER — PROMETHAZINE HCL 25 MG/ML IJ SOLN
6.2500 mg | INTRAMUSCULAR | Status: DC | PRN
  Filled 2014-03-13: qty 1

## 2014-03-13 MED ORDER — DEXAMETHASONE SODIUM PHOSPHATE 10 MG/ML IJ SOLN
10.0000 mg | Freq: Once | INTRAMUSCULAR | Status: AC
  Administered 2014-03-13: 10 mg via INTRAVENOUS
  Filled 2014-03-13: qty 1

## 2014-03-13 MED ORDER — FENTANYL CITRATE 0.05 MG/ML IJ SOLN
INTRAMUSCULAR | Status: AC
  Filled 2014-03-13: qty 4

## 2014-03-13 MED ORDER — HYDROCODONE-ACETAMINOPHEN 5-325 MG PO TABS: 1.0000 | ORAL_TABLET | Freq: Four times a day (QID) | ORAL | Status: DC | PRN

## 2014-03-13 MED ORDER — DEXAMETHASONE SODIUM PHOSPHATE 4 MG/ML IJ SOLN: INTRAMUSCULAR | Status: DC | PRN

## 2014-03-13 MED ORDER — MIDAZOLAM HCL 5 MG/5ML IJ SOLN
INTRAMUSCULAR | Status: DC | PRN
  Administered 2014-03-13: 1 mg via INTRAVENOUS

## 2014-03-13 MED ORDER — HYDROMORPHONE HCL PF 1 MG/ML IJ SOLN
0.2500 mg | INTRAMUSCULAR | Status: DC | PRN
  Filled 2014-03-13: qty 1

## 2014-03-13 MED ORDER — FENTANYL CITRATE 0.05 MG/ML IJ SOLN
INTRAMUSCULAR | Status: DC | PRN
  Administered 2014-03-13: 25 ug via INTRAVENOUS
  Administered 2014-03-13 (×2): 50 ug via INTRAVENOUS

## 2014-03-13 MED ORDER — LIDOCAINE HCL (CARDIAC) 20 MG/ML IV SOLN
INTRAVENOUS | Status: DC | PRN
  Administered 2014-03-13: 100 mg via INTRAVENOUS

## 2014-03-13 MED ORDER — SODIUM CHLORIDE 0.9 % IV SOLN
INTRAVENOUS | Status: DC
  Filled 2014-03-13: qty 1000

## 2014-03-13 MED ORDER — BUPIVACAINE HCL (PF) 0.25 % IJ SOLN
INTRAMUSCULAR | Status: DC | PRN
  Administered 2014-03-13: 20 mL

## 2014-03-13 MED ORDER — CHLORHEXIDINE GLUCONATE 4 % EX LIQD
60.0000 mL | Freq: Once | CUTANEOUS | Status: DC
Start: 2014-03-13 — End: 2014-03-13
  Filled 2014-03-13: qty 60

## 2014-03-13 MED ORDER — SODIUM CHLORIDE 0.9 % IR SOLN
Status: DC | PRN
  Administered 2014-03-13: 3000 mL
  Administered 2014-03-13: 6000 mL

## 2014-03-13 MED ORDER — PROPOFOL 10 MG/ML IV BOLUS
INTRAVENOUS | Status: DC | PRN
  Administered 2014-03-13: 200 mg via INTRAVENOUS

## 2014-03-13 MED ORDER — MIDAZOLAM HCL 2 MG/2ML IJ SOLN
INTRAMUSCULAR | Status: AC
  Filled 2014-03-13: qty 2

## 2014-03-13 MED ORDER — CEFAZOLIN SODIUM-DEXTROSE 2-3 GM-% IV SOLR
2.0000 g | INTRAVENOUS | Status: AC
  Administered 2014-03-13: 2 g via INTRAVENOUS
  Filled 2014-03-13: qty 50

## 2014-03-13 MED ORDER — ACETAMINOPHEN 10 MG/ML IV SOLN
1000.0000 mg | Freq: Once | INTRAVENOUS | Status: AC
  Administered 2014-03-13: 1000 mg via INTRAVENOUS
  Filled 2014-03-13: qty 100

## 2014-03-13 SURGICAL SUPPLY — 35 items
BANDAGE ELASTIC 6 VELCRO ST LF (GAUZE/BANDAGES/DRESSINGS) ×3 IMPLANT
BLADE 4.2CUDA (BLADE) ×3 IMPLANT
CANISTER SUCT LVC 12 LTR MEDI- (MISCELLANEOUS) ×3 IMPLANT
CANISTER SUCTION 2500CC (MISCELLANEOUS) ×3 IMPLANT
CLOTH BEACON ORANGE TIMEOUT ST (SAFETY) ×3 IMPLANT
CUFF TOURN SGL QUICK 34 (TOURNIQUET CUFF) ×2
CUFF TRNQT CYL 34X4X40X1 (TOURNIQUET CUFF) ×1 IMPLANT
DRAPE ARTHROSCOPY W/POUCH 114 (DRAPES) ×3 IMPLANT
DRAPE U-SHAPE 47X51 STRL (DRAPES) ×3 IMPLANT
DRSG ADAPTIC 3X8 NADH LF (GAUZE/BANDAGES/DRESSINGS) ×3 IMPLANT
DRSG EMULSION OIL 3X3 NADH (GAUZE/BANDAGES/DRESSINGS) ×3 IMPLANT
DRSG PAD ABDOMINAL 8X10 ST (GAUZE/BANDAGES/DRESSINGS) ×3 IMPLANT
DURAPREP 26ML APPLICATOR (WOUND CARE) ×3 IMPLANT
GLOVE BIO SURGEON STRL SZ 6 (GLOVE) ×3 IMPLANT
GLOVE BIO SURGEON STRL SZ8 (GLOVE) ×3 IMPLANT
GLOVE INDICATOR 6.5 STRL GRN (GLOVE) ×6 IMPLANT
GLOVE INDICATOR 8.0 STRL GRN (GLOVE) ×3 IMPLANT
GOWN STRL REUS W/TWL LRG LVL3 (GOWN DISPOSABLE) ×6 IMPLANT
IV NS IRRIG 3000ML ARTHROMATIC (IV SOLUTION) ×6 IMPLANT
KNEE WRAP E Z 3 GEL PACK (MISCELLANEOUS) ×3 IMPLANT
PACK ARTHROSCOPY DSU (CUSTOM PROCEDURE TRAY) ×3 IMPLANT
PACK BASIN DAY SURGERY FS (CUSTOM PROCEDURE TRAY) ×3 IMPLANT
PADDING CAST ABS 4INX4YD NS (CAST SUPPLIES) ×2
PADDING CAST ABS 6INX4YD NS (CAST SUPPLIES) ×2
PADDING CAST ABS COTTON 4X4 ST (CAST SUPPLIES) ×1 IMPLANT
PADDING CAST ABS COTTON 6X4 NS (CAST SUPPLIES) ×1 IMPLANT
PADDING CAST COTTON 6X4 STRL (CAST SUPPLIES) ×3 IMPLANT
SET ARTHROSCOPY TUBING (MISCELLANEOUS) ×2
SET ARTHROSCOPY TUBING LN (MISCELLANEOUS) ×1 IMPLANT
SPONGE GAUZE 4X4 12PLY (GAUZE/BANDAGES/DRESSINGS) ×3 IMPLANT
SPONGE GAUZE 4X4 12PLY STER LF (GAUZE/BANDAGES/DRESSINGS) ×3 IMPLANT
SUT ETHILON 4 0 PS 2 18 (SUTURE) ×3 IMPLANT
TOWEL OR 17X24 6PK STRL BLUE (TOWEL DISPOSABLE) ×6 IMPLANT
WAND 90 DEG TURBOVAC W/CORD (SURGICAL WAND) ×3 IMPLANT
WATER STERILE IRR 500ML POUR (IV SOLUTION) ×3 IMPLANT

## 2014-03-13 NOTE — Interval H&P Note (Signed)
History and Physical Interval Note:  03/13/2014 12:42 PM  Jacob Kerr  has presented today for surgery, with the diagnosis of LEFT KNEE LATERAL MENISCUS TEAR  The various methods of treatment have been discussed with the patient and family. After consideration of risks, benefits and other options for treatment, the patient has consented to  Procedure(s): LEFT ARTHROSCOPY KNEE WITH DEBRIDMENT (Left) as a surgical intervention .  The patient's history has been reviewed, patient examined, no change in status, stable for surgery.  I have reviewed the patient's chart and labs.  Questions were answered to the patient's satisfaction.     Gearlean Alf

## 2014-03-13 NOTE — Transfer of Care (Addendum)
Immediate Anesthesia Transfer of Care Note  Patient: Jacob Kerr  Procedure(s) Performed: Procedure(s) (LRB): LEFT ARTHROSCOPY KNEE WITH DEBRIDMENT (Left)  Patient Location: PACU  Anesthesia Type: General  Level of Consciousness:sleepy  Airway & Oxygen Therapy: Patient Spontanous Breathing and Patient connected to face mask oxygen, oral airway remaining.  Post-op Assessment: Report given to PACU RN and Post -op Vital signs reviewed and stable  Post vital signs: Reviewed and stable  Complications: No apparent anesthesia complications

## 2014-03-13 NOTE — Op Note (Signed)
Preoperative diagnosis-  Left knee lateral meniscal tear  Postoperative diagnosis Left- knee lateral meniscal tear   Procedure- Left knee arthroscopy with lateral meniscal debridement    Surgeon- Dione Plover. Aluisio, MD  Anesthesia-General  EBL-  Minimal  Complications- None  Condition- PACU - hemodynamically stable.  Brief clinical note- -Jacob Kerr is a 63 y.o.  male with a several month history of left knee pain and mechanical symptoms. Exam and history suggested lateral meniscal tear confirmed by MRI. The patient presents now for arthroscopy and debridement   Procedure in detail -       After successful administration of General anesthetic, a tourmiquet is placed high on the Left  thigh and the Left lower extremity is prepped and draped in the usual sterile fashion. Time out is performed by the surgical team. Standard superomedial and inferolateral portal sites are marked and incisions made with an 11 blade. The inflow cannula is passed through the superomedial portal and camera through the inferolateral portal and inflow is initiated. Arthroscopic visualization proceeds.      The undersurface of the patella and trochlea are visualized and they are normal. The medial and lateral gutters are visualized and there are  no loose bodies. Flexion and valgus force is applied to the knee and the medial compartment is entered. A spinal needle is passed into the joint through the site marked for the inferomedial portal. A small incision is made and the dilator passed into the joint. The findings for the medial compartment are normal .     The intercondylar notch is visualized and the ACL appears normal. The lateral compartment is entered and the findings are tear of body of lateral meniscus . The tear is debrided to a stable base with baskets and a shaver and sealed off with the Arthrocare. It is probed and found to be stable.     The joint is again inspected and there are no other tears, defects  or loose bodies identified. The arthroscopic equipment is then removed from the inferior portals which are closed with interrupted 4-0 nylon. 20 ml of .25% Marcaine with epinephrine are injected through the inflow cannula and the cannula is then removed and the portal closed with nylon. The incisions are cleaned and dried and a bulky sterile dressing is applied. The patient is then awakened and transported to recovery in stable condition.   03/13/2014, 1:24 PM

## 2014-03-13 NOTE — Discharge Instructions (Addendum)
Arthroscopic Procedure, Knee An arthroscopic procedure can find what is wrong with your knee. PROCEDURE Arthroscopy is a surgical technique that allows your orthopedic surgeon to diagnose and treat your knee injury with accuracy. They will look into your knee through a small instrument. This is almost like a small (pencil sized) telescope. Because arthroscopy affects your knee less than open knee surgery, you can anticipate a more rapid recovery. Taking an active role by following your caregiver's instructions will help with rapid and complete recovery. Use crutches, rest, elevation, ice, and knee exercises as instructed. The length of recovery depends on various factors including type of injury, age, physical condition, medical conditions, and your rehabilitation. Your knee is the joint between the large bones (femur and tibia) in your leg. Cartilage covers these bone ends which are smooth and slippery and allow your knee to bend and move smoothly. Two menisci, thick, semi-lunar shaped pads of cartilage which form a rim inside the joint, help absorb shock and stabilize your knee. Ligaments bind the bones together and support your knee joint. Muscles move the joint, help support your knee, and take stress off the joint itself. Because of this all programs and physical therapy to rehabilitate an injured or repaired knee require rebuilding and strengthening your muscles. AFTER THE PROCEDURE  After the procedure, you will be moved to a recovery area until most of the effects of the medication have worn off. Your caregiver will discuss the test results with you.   Only take over-the-counter or prescription medicines for pain, discomfort, or fever as directed by your caregiver.  SEEK MEDICAL CARE IF:   You have increased bleeding from your wounds.   You see redness, swelling, or have increasing pain in your wounds.   You have pus coming from your wound.   You have an oral temperature above 102 F (38.9  C).   You notice a bad smell coming from the wound or dressing.   You have severe pain with any motion of your knee.  SEEK IMMEDIATE MEDICAL CARE IF:   You develop a rash.   You have difficulty breathing.   You have any allergic problems.  FURTHER INSTRUCTIONS:  You may start showering two days after being discharged home but do not submerge the incisions under water.   Change dressing 48 hours after the procedure and then cover the small incisions with band aids until your follow up visit.  Avoid periods of inactivity such as sitting longer than an hour when not asleep. This helps prevent blood clots.   You may put full weight on your legs and walk as much as is comfortable.   Do not drive while taking narcotics.  Wear the elastic stockings for three weeks following surgery during the day but you may remove then at night.  Make sure you keep all of your appointments after your operation with all of your doctors and caregivers. You should call the office at [phone] and make an appointment for approximately one week after the date of your surgery.  Please pick up a stool softener and laxative for home use as long as you are requiring pain medications.  Continue to use ice on the knee for pain and swelling from surgery. You may notice swelling that will progress down to the foot and ankle.  This is normal after surgery.  Elevate the leg when you are not up walking on it.   RANGE OF MOTION AND STRENGTHENING EXERCISES  Rehabilitation of the knee is  important following a knee injury or an operation. After just a few days of immobilization, the muscles of the thigh which control the knee become weakened and shrink (atrophy). Knee exercises are designed to build up the tone and strength of the thigh muscles and to improve knee motion. Often times heat used for twenty to thirty minutes before working out will loosen up your tissues and help with improving the range of motion but do not  use heat for the first two weeks following surgery. These exercises can be done on a training (exercise) mat, on the floor, on a table or on a bed. Use what ever works the best and is most comfortable for you Knee exercises include:       QUAD STRENGTHENING EXERCISES Strengthening Quadriceps Sets  Tighten muscles on top of thigh by pushing knees down into floor or table. Hold for 20 seconds. Repeat 10 times. Do 2 sessions per day.    Strengthening Terminal Knee Extension  With knee bent over bolster, straighten knee by tightening muscle on top of thigh. Be sure to keep bottom of knee on bolster. Hold for 20 seconds. Repeat 10 times. Do 2 sessions per day.   Straight Leg with Bent Knee  Lie on back with opposite leg bent. Keep involved knee slightly bent at knee and raise leg 4-6". Hold for 10 seconds. Repeat 20 times per set. Do 2 sets per session. Do 2 sessions per day.  Post Anesthesia Home Care Instructions  Activity: Get plenty of rest for the remainder of the day. A responsible adult should stay with you for 24 hours following the procedure.  For the next 24 hours, DO NOT: -Drive a car -Paediatric nurse -Drink alcoholic beverages -Take any medication unless instructed by your physician -Make any legal decisions or sign important papers.  Meals: Start with liquid foods such as gelatin or soup. Progress to regular foods as tolerated. Avoid greasy, spicy, heavy foods. If nausea and/or vomiting occur, drink only clear liquids until the nausea and/or vomiting subsides. Call your physician if vomiting continues.  Special Instructions/Symptoms: Your throat may feel dry or sore from the anesthesia or the breathing tube placed in your throat during surgery. If this causes discomfort, gargle with warm salt water. The discomfort should disappear within 24 hours.

## 2014-03-13 NOTE — Anesthesia Procedure Notes (Signed)
Procedure Name: LMA Insertion Date/Time: 03/13/2014 12:54 PM Performed by: Mechele Claude Pre-anesthesia Checklist: Patient identified, Emergency Drugs available, Suction available and Patient being monitored Patient Re-evaluated:Patient Re-evaluated prior to inductionOxygen Delivery Method: Circle System Utilized Preoxygenation: Pre-oxygenation with 100% oxygen Intubation Type: IV induction Ventilation: Mask ventilation without difficulty LMA: LMA inserted LMA Size: 4.0 Number of attempts: 1 Airway Equipment and Method: bite block Placement Confirmation: positive ETCO2 Tube secured with: Tape Dental Injury: Teeth and Oropharynx as per pre-operative assessment

## 2014-03-13 NOTE — Anesthesia Postprocedure Evaluation (Signed)
  Anesthesia Post-op Note  Patient: Jacob Kerr  Procedure(s) Performed: Procedure(s) (LRB): LEFT ARTHROSCOPY KNEE WITH DEBRIDMENT (Left)  Patient Location: PACU  Anesthesia Type: General  Level of Consciousness: awake and alert   Airway and Oxygen Therapy: Patient Spontanous Breathing  Post-op Pain: mild  Post-op Assessment: Post-op Vital signs reviewed, Patient's Cardiovascular Status Stable, Respiratory Function Stable, Patent Airway and No signs of Nausea or vomiting  Last Vitals:  Filed Vitals:   03/13/14 1329  BP: 110/70  Pulse: 64  Temp: 36.5 C  Resp: 8    Post-op Vital Signs: stable   Complications: No apparent anesthesia complications

## 2014-03-13 NOTE — Anesthesia Preprocedure Evaluation (Signed)
Anesthesia Evaluation  Patient identified by MRN, date of birth, ID band Patient awake    Reviewed: Allergy & Precautions, H&P , NPO status , Patient's Chart, lab work & pertinent test results  Airway Mallampati: II TM Distance: >3 FB Neck ROM: Full    Dental  (+) Chipped, Dental Advisory Given, Caps,    Pulmonary neg pulmonary ROS,  breath sounds clear to auscultation  Pulmonary exam normal       Cardiovascular hypertension, Pt. on medications Rhythm:Regular Rate:Normal     Neuro/Psych Syrinx of spinal cord, peripheral neuropathy. TIA Neuromuscular disease negative neurological ROS  negative psych ROS   GI/Hepatic Neg liver ROS, hiatal hernia, GERD-  Medicated,  Endo/Other  Hypothyroidism   Renal/GU negative Renal ROS  negative genitourinary   Musculoskeletal negative musculoskeletal ROS (+)   Abdominal   Peds  Hematology negative hematology ROS (+)   Anesthesia Other Findings   Reproductive/Obstetrics                           Anesthesia Physical Anesthesia Plan  ASA: II  Anesthesia Plan: General   Post-op Pain Management:    Induction: Intravenous  Airway Management Planned: LMA  Additional Equipment:   Intra-op Plan:   Post-operative Plan: Extubation in OR  Informed Consent: I have reviewed the patients History and Physical, chart, labs and discussed the procedure including the risks, benefits and alternatives for the proposed anesthesia with the patient or authorized representative who has indicated his/her understanding and acceptance.   Dental advisory given  Plan Discussed with: CRNA  Anesthesia Plan Comments:         Anesthesia Quick Evaluation

## 2014-03-14 ENCOUNTER — Encounter (HOSPITAL_BASED_OUTPATIENT_CLINIC_OR_DEPARTMENT_OTHER): Payer: Self-pay | Admitting: Orthopedic Surgery

## 2014-03-26 ENCOUNTER — Other Ambulatory Visit: Payer: Self-pay | Admitting: Family Medicine

## 2014-03-29 ENCOUNTER — Other Ambulatory Visit: Payer: Self-pay | Admitting: Family Medicine

## 2014-04-20 ENCOUNTER — Other Ambulatory Visit: Payer: Self-pay | Admitting: Family Medicine

## 2014-05-20 ENCOUNTER — Ambulatory Visit (INDEPENDENT_AMBULATORY_CARE_PROVIDER_SITE_OTHER): Payer: 59 | Admitting: *Deleted

## 2014-05-20 DIAGNOSIS — Z23 Encounter for immunization: Secondary | ICD-10-CM

## 2014-05-20 MED ORDER — ZOSTER VACCINE LIVE 19400 UNT/0.65ML ~~LOC~~ SOLR: 0.6500 mL | Freq: Once | SUBCUTANEOUS | Status: DC

## 2014-05-20 NOTE — Patient Instructions (Signed)
Shingles Vaccine What You Need to Know WHAT IS SHINGLES?  Shingles is a painful skin rash, often with blisters. It is also called Herpes Zoster or just Zoster.  A shingles rash usually appears on one side of the face or body and lasts from 2 to 4 weeks. Its main symptom is pain, which can be quite severe. Other symptoms of shingles can include fever, headache, chills, and upset stomach. Very rarely, a shingles infection can lead to pneumonia, hearing problems, blindness, brain inflammation (encephalitis), or death.  For about 1 person in 5, severe pain can continue even after the rash clears up. This is called post-herpetic neuralgia.  Shingles is caused by the Varicella Zoster virus. This is the same virus that causes chickenpox. Only someone who has had a case of chickenpox or rarely, has gotten chickenpox vaccine, can get shingles. The virus stays in your body. It can reappear many years later to cause a case of shingles.  You cannot catch shingles from another person with shingles. However, a person who has never had chickenpox (or chickenpox vaccine) could get chickenpox from someone with shingles. This is not very common.  Shingles is far more common in people 50 and older than in younger people. It is also more common in people whose immune systems are weakened because of a disease such as cancer or drugs such as steroids or chemotherapy.  At least 1 million people get shingles per year in the [place]. SHINGLES VACCINE  A vaccine for shingles was licensed in 2006. In clinical trials, the vaccine reduced the risk of shingles by 50%. It can also reduce the pain in people who still get shingles after being vaccinated.  A single dose of shingles vaccine is recommended for adults 60 years of age and older. SOME PEOPLE SHOULD NOT GET SHINGLES VACCINE OR SHOULD WAIT A person should not get shingles vaccine if he or she:  Has ever had a life-threatening allergic reaction to gelatin, the  antibiotic neomycin, or any other component of shingles vaccine. Tell your caregiver if you have any severe allergies.  Has a weakened immune system because of current:  AIDS or another disease that affects the immune system.  Treatment with drugs that affect the immune system, such as prolonged use of high-dose steroids.  Cancer treatment, such as radiation or chemotherapy.  Cancer affecting the bone marrow or lymphatic system, such as leukemia or lymphoma.  Is pregnant, or might be pregnant. Women should not become pregnant until at least 4 weeks after getting shingles vaccine. Someone with a minor illness, such as a cold, may be vaccinated. Anyone with a moderate or severe acute illness should usually wait until he or she recovers before getting the vaccine. This includes anyone with a temperature of 101.3 F (38 C) or higher. WHAT ARE THE RISKS FROM SHINGLES VACCINE?  A vaccine, like any medicine, could possibly cause serious problems, such as severe allergic reactions. However, the risk of a vaccine causing serious harm, or death, is extremely small.  No serious problems have been identified with shingles vaccine. Mild Problems  Redness, soreness, swelling, or itching at the site of the injection (about 1 person in 3).  Headache (about 1 person in 70). Like all vaccines, shingles vaccine is being closely monitored for unusual or severe problems. WHAT IF THERE IS A MODERATE OR SEVERE REACTION? What should I look for? Any unusual condition, such as a severe allergic reaction or a high fever. If a severe allergic reaction   occurred, it would be within a few minutes to an hour after the shot. Signs of a serious allergic reaction can include difficulty breathing, weakness, hoarseness or wheezing, a fast heartbeat, hives, dizziness, paleness, or swelling of the throat. What should I do?  Call your caregiver, or get the person to a caregiver right away.  Tell the caregiver what  happened, the date and time it happened, and when the vaccination was given.  Ask the caregiver to report the reaction by filing a Vaccine Adverse Event Reporting System (VAERS) form. Or, you can file this report through the VAERS web site at www.vaers.hhs.gov or by calling 1-800-822-7967. VAERS does not provide medical advice. HOW CAN I LEARN MORE?  Ask your caregiver. He or she can give you the vaccine package insert or suggest other sources of information.  Contact the Centers for Disease Control and Prevention (CDC):  Call 1-800-232-4636 (1-800-CDC-INFO).  Visit the CDC website at www.cdc.gov/vaccines CDC Shingles Vaccine VIS (07/02/08) Document Released: 07/11/2006 Document Revised: 12/06/2011 Document Reviewed: 01/03/2013 Exit[employer] Patient Information 2015 [employer]. This information is not intended to replace advice given to you by your health care provider. Make sure you discuss any questions you have with your health care provider.  

## 2014-05-20 NOTE — Addendum Note (Signed)
Addended by: Ilean China on: 05/20/2014 11:41 AM   Modules accepted: Orders, Medications

## 2014-05-20 NOTE — Progress Notes (Signed)
Patient tolerated well.

## 2014-05-22 ENCOUNTER — Other Ambulatory Visit: Payer: Self-pay | Admitting: Family Medicine

## 2014-05-24 NOTE — Telephone Encounter (Signed)
Last seen 11/06/13  Dr Ernestina Patches  Last Thyroid level 2012

## 2014-05-25 NOTE — Telephone Encounter (Signed)
Please make sure that this patient gets a thyroid profile. This is okay to refill x6 months

## 2014-05-26 ENCOUNTER — Other Ambulatory Visit: Payer: Self-pay | Admitting: Family Medicine

## 2014-06-28 ENCOUNTER — Other Ambulatory Visit: Payer: Self-pay | Admitting: *Deleted

## 2014-06-28 MED ORDER — SYNTHROID 175 MCG PO TABS: 175.0000 ug | ORAL_TABLET | Freq: Every day | ORAL | Status: DC

## 2014-07-12 ENCOUNTER — Ambulatory Visit (INDEPENDENT_AMBULATORY_CARE_PROVIDER_SITE_OTHER): Payer: 59

## 2014-07-12 DIAGNOSIS — Z23 Encounter for immunization: Secondary | ICD-10-CM

## 2014-07-31 ENCOUNTER — Other Ambulatory Visit: Payer: Self-pay | Admitting: Family Medicine

## 2014-08-03 ENCOUNTER — Other Ambulatory Visit: Payer: Self-pay | Admitting: Family Medicine

## 2014-08-05 NOTE — Telephone Encounter (Signed)
Approved BP meds, next appt is on this Thursday.

## 2014-08-07 ENCOUNTER — Telehealth: Payer: Self-pay | Admitting: Neurology

## 2014-08-07 ENCOUNTER — Ambulatory Visit (INDEPENDENT_AMBULATORY_CARE_PROVIDER_SITE_OTHER): Payer: 59 | Admitting: Family Medicine

## 2014-08-07 ENCOUNTER — Encounter: Payer: Self-pay | Admitting: Family Medicine

## 2014-08-07 VITALS — BP 151/91 | HR 74 | Temp 98.7°F | Ht 72.0 in | Wt 209.4 lb

## 2014-08-07 DIAGNOSIS — H53453 Other localized visual field defect, bilateral: Secondary | ICD-10-CM

## 2014-08-07 DIAGNOSIS — Z0000 Encounter for general adult medical examination without abnormal findings: Secondary | ICD-10-CM

## 2014-08-07 DIAGNOSIS — E291 Testicular hypofunction: Secondary | ICD-10-CM

## 2014-08-07 DIAGNOSIS — M545 Low back pain, unspecified: Secondary | ICD-10-CM

## 2014-08-07 DIAGNOSIS — N400 Enlarged prostate without lower urinary tract symptoms: Secondary | ICD-10-CM

## 2014-08-07 DIAGNOSIS — E349 Endocrine disorder, unspecified: Secondary | ICD-10-CM

## 2014-08-07 DIAGNOSIS — R002 Palpitations: Secondary | ICD-10-CM

## 2014-08-07 DIAGNOSIS — E785 Hyperlipidemia, unspecified: Secondary | ICD-10-CM

## 2014-08-07 DIAGNOSIS — I10 Essential (primary) hypertension: Secondary | ICD-10-CM

## 2014-08-07 NOTE — Telephone Encounter (Signed)
Dr. Laurance Flatten contacted our office today. This patient noted onset of palpitations of the heart that lasted only about 3 or 4 minutes, and during that period time, he had transient visual loss off to the right. No other associated symptoms were noted. Current EKG shows normal sinus rhythm. The patient will need to be worked in on an urgent basis for a possible TIA event. We will try to get him worked in Friday afternoon.

## 2014-08-07 NOTE — Patient Instructions (Addendum)
We spoke with Dr Jannifer Franklin He will see you soon He will arrange MRI and MRA He will arrange cardiac referral/ appt  We will postpone CPE with Dr Laurance Flatten until this issue is taking care of  NO MORE DRIVING UNTIL DR Nelson!!! Please call us sooner if any further problems occur Take blood pressure readings with you when you visit the neurologist

## 2014-08-07 NOTE — Progress Notes (Signed)
Subjective:    Patient ID: Jacob Kerr, male    DOB: 1951-07-26, 63 y.o.   MRN: [id number]  HPI Patient came in today complaining with palpitations and loss of peripheral vision. This lasted only a short while.the patient indicates the palpitations were occurring initially for about 3 minutes and then the visual changes in his peripheral vision lasted for 10 seconds. He was ready to call EMS but then everything got better. There was no motor weakness. He also indicated that he's been seeing an orthopedic specialist because of some injury to his thoracic spine and has had injections in this area about 2 weeks ago. He describes the symptoms today as similar to those that he had several years ago when he had a TIA. A recheck of his blood pressure by me this morning was 134/90 in the right arm. An EKG had been done before I went in the room and it was within normal limits. The patient has put on some weight because of his inactivity secondary to his thoracic spine injury.   Review of Systems  Constitutional: Negative.   HENT: Negative.   Eyes: Positive for visual disturbance.  Respiratory: Negative.   Cardiovascular: Positive for palpitations.  Gastrointestinal: Negative.   Endocrine: Negative.   Genitourinary: Negative.   Musculoskeletal: Negative.  Negative for back pain.  Skin: Negative.   Allergic/Immunologic: Negative.   Neurological: Positive for dizziness.  Hematological: Negative.   Psychiatric/Behavioral: Negative.       Patient Active Problem List   Diagnosis Date Noted  . Lateral meniscal tear 03/12/2014  . Testosterone deficiency 07/04/2013  . Cervicogenic headache 03/28/2013  . Vitamin D deficiency 01/01/2013  . Hyperlipidemia 01/01/2013  . BPH (benign prostatic hyperplasia) 11/03/2012  . Diverticulosis large intestine w/o perforation or abscess w/bleeding 11/03/2012  . Peripheral neuropathy 11/03/2012  . Unspecified hypothyroidism 01/18/2011  . Unspecified  essential hypertension 01/18/2011  . GERD (gastroesophageal reflux disease) 01/18/2011  . Cervical spondylosis without myelopathy 11/24/2010  . Headache(784.0) 11/24/2010  . Sleep disturbance, unspecified 11/24/2010  . Syringomyelia and syringobulbia 11/24/2010  . Disturbance of skin sensation 11/24/2010  . Cerebrovascular disease, unspecified 11/24/2010   Outpatient Encounter Prescriptions as of 08/07/2014  Medication Sig  . amLODipine (NORVASC) 5 MG tablet Take 1 tablet by mouth  daily  . aspirin 81 MG tablet Take 81 mg by mouth daily.    Marland Kitchen BENICAR 40 MG tablet Take 1 tablet by mouth  daily  . Coenzyme Q10 (CO Q 10) 100 MG CAPS Take 1 capsule by mouth daily.  . cyclobenzaprine (FLEXERIL) 10 MG tablet Take 1 tablet (10 mg total) by mouth at bedtime.  Marland Kitchen HYDROcodone-acetaminophen (NORCO) 5-325 MG per tablet Take 1-2 tablets by mouth every 6 (six) hours as needed for moderate pain.  . methocarbamol (ROBAXIN) 500 MG tablet Take 1 tablet (500 mg total) by mouth 4 (four) times daily. As needed for muscle spasm  . omeprazole (PRILOSEC) 20 MG capsule Take 1 capsule by mouth  twice daily  . SYNTHROID 175 MCG tablet Take 1 tablet (175 mcg total) by mouth daily before breakfast.  . testosterone (ANDRODERM) 4 MG/24HR PT24 patch Place 1 patch onto the skin daily.       Objective:   Physical Exam  Constitutional: He is oriented to person, place, and time. He appears well-developed and well-nourished. No distress.  HENT:  Head: Normocephalic and atraumatic.  Right Ear: External ear normal.  Left Ear: External ear normal.  Nose: Nose normal.  Mouth/Throat: Oropharynx  is clear and moist. No oropharyngeal exudate.  The fundi appeared normal bilaterally. Pupils are equal round and reactive to light and accommodation.  Eyes: Conjunctivae and EOM are normal. Pupils are equal, round, and reactive to light. Right eye exhibits no discharge. Left eye exhibits no discharge. No scleral icterus.  Neck:  Normal range of motion. Neck supple. No thyromegaly present.  There are no carotid bruits or anterior cervical adenopathy  Cardiovascular: Normal rate, regular rhythm and normal heart sounds.  Exam reveals no gallop and no friction rub.   No murmur heard. The rhythm was regular at 72/m  Pulmonary/Chest: Effort normal and breath sounds normal. No respiratory distress. He has no wheezes. He has no rales. He exhibits no tenderness.  Lungs were clear anteriorly and posteriorly  Abdominal: Soft. There is no tenderness. There is no rebound and no guarding.  Musculoskeletal: Normal range of motion. He exhibits no edema or tenderness.  The patient had good strength bilaterally and reflexes were equal bilaterally in upper and lower extremity  Lymphadenopathy:    He has no cervical adenopathy.  Neurological: He is alert and oriented to person, place, and time. He has normal reflexes.  Skin: Skin is warm and dry. No rash noted. There is erythema.  Psychiatric: He has a normal mood and affect. His behavior is normal. Judgment and thought content normal.  Nursing note and vitals reviewed.  BP 151/91 mmHg  Pulse 74  Ht 6' (1.829 m)  Wt 209 lb 6.4 oz (94.983 kg)  BMI 28.39 kg/m2        Assessment & Plan:  1. Palpitations - EKG 12-Lead - POCT CBC - BMP8+EGFR - Hepatic function panel - PSA, total and free - Lipid panel - Thyroid Panel With TSH - Vit D  25 hydroxy (rtn osteoporosis monitoring)  2. Peripheral vision loss, bilateral -appointment with neurology after discussion with the neurologist  3. Hyperlipidemia  4. Testosterone deficiency  5. Essential hypertension, benign  6. BPH (benign prostatic hyperplasia)  7. Midline low back pain without sciatica -continue follow-up with orthopedic surgeon  8. Annual physical exam -to be done in the near future - EKG 12-Lead - POCT CBC - BMP8+EGFR - Hepatic function panel - PSA, total and free - Lipid panel - Thyroid Panel With  TSH - Vit D  25 hydroxy (rtn osteoporosis monitoring)  Patient Instructions  We spoke with Dr Jannifer Franklin He will see you soon He will arrange MRI and MRA He will arrange cardiac referral/ appt  We will postpone CPE with Dr Laurance Flatten until this issue is taking care of  NO MORE DRIVING UNTIL DR Margit Hanks!!! Please call us sooner if any further problems occur Take blood pressure readings with you when you visit the neurologist   Arrie Senate MD

## 2014-08-07 NOTE — Telephone Encounter (Signed)
Spoke to patient and he is scheduled for 08-09-14 at 0800.

## 2014-08-08 ENCOUNTER — Telehealth: Payer: Self-pay | Admitting: *Deleted

## 2014-08-08 ENCOUNTER — Encounter: Payer: 59 | Admitting: Family Medicine

## 2014-08-08 LAB — THYROID PANEL WITH TSH
Free Thyroxine Index: 3.2 (ref 1.2–4.9)
T3 UPTAKE RATIO: 27 % (ref 24–39)
T4, Total: 11.9 ug/dL (ref 4.5–12.0)
TSH: 3.19 u[IU]/mL (ref 0.450–4.500)

## 2014-08-08 LAB — LIPID PANEL
CHOL/HDL RATIO: 6 ratio — AB (ref 0.0–5.0)
Cholesterol, Total: 198 mg/dL (ref 100–199)
HDL: 33 mg/dL — ABNORMAL LOW (ref >39)
LDL Calculated: 103 mg/dL — ABNORMAL HIGH (ref 0–99)
Triglycerides: 312 mg/dL — ABNORMAL HIGH (ref 0–149)
VLDL Cholesterol Cal: 62 mg/dL — ABNORMAL HIGH (ref 5–40)

## 2014-08-08 LAB — HEPATIC FUNCTION PANEL
ALK PHOS: 121 IU/L — AB (ref 39–117)
ALT: 25 IU/L (ref 0–44)
AST: 17 IU/L (ref 0–40)
Albumin: 4.6 g/dL (ref 3.6–4.8)
Bilirubin, Direct: 0.17 mg/dL (ref 0.00–0.40)
TOTAL PROTEIN: 6.8 g/dL (ref 6.0–8.5)
Total Bilirubin: 1 mg/dL (ref 0.0–1.2)

## 2014-08-08 LAB — BMP8+EGFR
BUN/Creatinine Ratio: 9 — ABNORMAL LOW (ref 10–22)
BUN: 12 mg/dL (ref 8–27)
CHLORIDE: 105 mmol/L (ref 97–108)
CO2: 25 mmol/L (ref 18–29)
Calcium: 9.7 mg/dL (ref 8.6–10.2)
Creatinine, Ser: 1.3 mg/dL — ABNORMAL HIGH (ref 0.76–1.27)
GFR, EST AFRICAN AMERICAN: 68 mL/min/{1.73_m2} (ref >59)
GFR, EST NON AFRICAN AMERICAN: 58 mL/min/{1.73_m2} — AB (ref >59)
GLUCOSE: 142 mg/dL — AB (ref 65–99)
POTASSIUM: 4.8 mmol/L (ref 3.5–5.2)
SODIUM: 147 mmol/L — AB (ref 134–144)

## 2014-08-08 LAB — VITAMIN D 25 HYDROXY (VIT D DEFICIENCY, FRACTURES): Vit D, 25-Hydroxy: 18.2 ng/mL — ABNORMAL LOW (ref 30.0–100.0)

## 2014-08-08 LAB — PSA, TOTAL AND FREE
PSA, Free Pct: 45 %
PSA, Free: 0.09 ng/mL
PSA: 0.2 ng/mL (ref 0.0–4.0)

## 2014-08-08 MED ORDER — VITAMIN D (ERGOCALCIFEROL) 1.25 MG (50000 UNIT) PO CAPS: 50000.0000 [IU] | ORAL_CAPSULE | ORAL | Status: DC

## 2014-08-08 NOTE — Telephone Encounter (Signed)
-----   Message from Chipper Herb, MD sent at 08/08/2014  1:44 PM EST ----- The blood sugar is elevated at 142. The creatinine is slightly elevated at 1.30. Please make sure that the patient is not taking any NSAIDs.the sodium is slightly elevated at 147. 1 liver function test is slightly elevated at 121 and this is the alkaline phosphatase. We'll continue to monitor this. The PSA remains low and within normal limits On a traditional lipid panel the triglycerides are elevated at 312, I do not think the patient was fasting. The LDL C was slightly elevated at 103. The good cholesterol or the HDL was low. All thyroid function tests were within normal limits The vitamin D level was very low at 18.2, please confirm his current treatment.

## 2014-08-09 ENCOUNTER — Ambulatory Visit (INDEPENDENT_AMBULATORY_CARE_PROVIDER_SITE_OTHER): Payer: 59 | Admitting: Neurology

## 2014-08-09 ENCOUNTER — Encounter: Payer: Self-pay | Admitting: Neurology

## 2014-08-09 VITALS — BP 139/85 | HR 63 | Ht 69.0 in | Wt 207.6 lb

## 2014-08-09 DIAGNOSIS — G459 Transient cerebral ischemic attack, unspecified: Secondary | ICD-10-CM | POA: Insufficient documentation

## 2014-08-09 DIAGNOSIS — G451 Carotid artery syndrome (hemispheric): Secondary | ICD-10-CM

## 2014-08-09 DIAGNOSIS — R51 Headache: Secondary | ICD-10-CM

## 2014-08-09 DIAGNOSIS — G4486 Cervicogenic headache: Secondary | ICD-10-CM

## 2014-08-09 NOTE — Progress Notes (Signed)
Reason for visit: possible TIA  Jacob Kerr is an 63 y.o. male  History of present illness:  Jacob Kerr is a 63 year old right-handed white male with a history of a cervical and thoracic cord syrinx. The patient has had cervicogenic headaches as well. The patient comes in today on an urgent basis for an event that occurred 2 days ago. The patient was operating his motor vehicle at that time, and he felt palpitations of the heart. He felt there was a several second pause in his heart rate, and he developed darkness of vision bilaterally, with a central bright light. The patient felt dizzy at that time. The episode lasted 5-10 seconds, and then resolved. The palpitations of the heart improved, and he went to his primary doctor and an EKG at that time was unremarkable. The patient did not report any focal numbness or weakness of the face, arms, or legs during the event. He was by himself, and he did not speak during the event. He indicates that his cervicogenic headache is improved since the episode. The patient is reporting a lot of neck and mid back pain. The patient has been out of work since June 2015 after a fall at work. The patient reports that he had an epidural steroid injection 2 weeks ago, and the palpitations have worsened since that time. He began having palpitations one month ago. The patient has not had any blackout episodes. He is sent to this office for further evaluation.  Past Medical History  Diagnosis Date  . Hypertension   . Hypothyroidism   . Cerebrovascular disease   . Cervicogenic headache   . History of TIA (transient ischemic attack)     04/ 2011---  no residuals  . Acute meniscal tear of left knee   . BPH (benign prostatic hypertrophy)   . History of diverticulitis   . GERD (gastroesophageal reflux disease)   . Chronic neck pain     uses TENS unit  prn  . Decreased range of motion of neck   . Syrinx of spinal cord     C4 -- C7  . Allergic rhinitis   .  Peripheral neuropathy   . Cervical spondylosis without myelopathy   . History of kidney stones   . Wears glasses   . H/O hiatal hernia     Past Surgical History  Procedure Laterality Date  . Hemorrhoid surgery  1992  . Laparoscopic cholecystectomy  01-30-2003  . Right ureteroscopic laser lithotripsy stone extraction /  stent placement  05-10-2000  . Cardiac catheterization  02-18-2010   dr Angelena Form    normal coronary arteries/  normal lvsf--  ef 65%  . Transthoracic echocardiogram  01-16-2010    normal lvf/  ef  60%/  mild lae  . Negative sleep study  2013    per pt  . Removal forgein body from ear  AGE 4  . Knee arthroscopy Left 03/13/2014    Procedure: LEFT ARTHROSCOPY KNEE WITH DEBRIDMENT;  Surgeon: Gearlean Alf, MD;  Location: [facility];  Service: Orthopedics;  Laterality: Left;    Family History  Problem Relation Age of Onset  . Cancer Father     Prostate Cancer  . Heart disease Father   . Stroke Father   . Nephrolithiasis Sister   . Cancer Sister     Lung Cancer  . Hyperlipidemia Other     FH  . Heart disease Other     FH  . Stroke Other  FH  . Kidney disease Other   . Hypertension Other     FH  . Diabetes Other     Parent, Other Relative  . Heart disease Mother   . Cancer Sister     sinus  . COPD Sister   . Hyperlipidemia Sister   . Hyperlipidemia Sister     Social history:  reports that he has never smoked. He has never used smokeless tobacco. He reports that he drinks alcohol. He reports that he does not use illicit drugs.    Allergies  Allergen Reactions  . Sudafed [Pseudoephedrine Hcl] Other (See Comments)    Increased heart rate  . Epinephrine Palpitations    Medications:  Current Outpatient Prescriptions on File Prior to Visit  Medication Sig Dispense Refill  . amLODipine (NORVASC) 5 MG tablet Take 1 tablet by mouth  daily 90 tablet 1  . aspirin 81 MG tablet Take 81 mg by mouth daily.      Marland Kitchen BENICAR 40 MG tablet Take  1 tablet by mouth  daily 90 tablet 2  . Coenzyme Q10 (CO Q 10) 100 MG CAPS Take 1 capsule by mouth daily.    . cyclobenzaprine (FLEXERIL) 10 MG tablet Take 1 tablet (10 mg total) by mouth at bedtime. 30 tablet 5  . HYDROcodone-acetaminophen (NORCO) 5-325 MG per tablet Take 1-2 tablets by mouth every 6 (six) hours as needed for moderate pain. 40 tablet 0  . omeprazole (PRILOSEC) 20 MG capsule Take 1 capsule by mouth  twice daily 180 capsule 3  . SYNTHROID 175 MCG tablet Take 1 tablet (175 mcg total) by mouth daily before breakfast. 90 tablet 0  . testosterone (ANDRODERM) 4 MG/24HR PT24 patch Place 1 patch onto the skin daily. 90 patch 0  . Vitamin D, Ergocalciferol, (DRISDOL) 50000 UNITS CAPS capsule Take 1 capsule (50,000 Units total) by mouth every 7 (seven) days. 12 capsule 1   No current facility-administered medications on file prior to visit.    ROS:  Out of a complete 14 system review of symptoms, the patient complains only of the following symptoms, and all other reviewed systems are negative.  Decreased activity Ringing in the ears Visual loss Constipation Palpitations of the heart, chest pain Back pain, neck pain Moles Dizziness, headache Agitation  Blood pressure 139/85, pulse 63, height 5\' 9"  (1.753 m), weight 207 lb 9.6 oz (94.167 kg).  Physical Exam  General: The patient is alert and cooperative at the time of the examination.  Skin: No significant peripheral edema is noted.   Neurologic Exam  Mental status: The patient is oriented x 3.  Cranial nerves: Facial symmetry is present. Speech is normal, no aphasia or dysarthria is noted. Extraocular movements are full. Visual fields are full.  Motor: The patient has good strength in all 4 extremities.  Sensory examination: Soft touch sensation is symmetric on the face, arms, and legs.  Coordination: The patient has good finger-nose-finger and heel-to-shin bilaterally.  Gait and station: The patient has a normal  gait. Tandem gait is slightly unsteady. Romberg is positive, the patient leans backwards. No drift is seen.  Reflexes: Deep tendon reflexes are symmetric.   Assessment/Plan:  1. Episode of visual loss, near syncope  2. Palpitations of the heart, possible atrial fibrillation  3. Cervical cord syrinx  4. Cervicogenic headache  The patient likely did not suffer a TIA. The episode appears to be consistent with a transient drop in blood pressure associated with his cardiac rhythm abnormality. The patient had "  tunnel vision", not homonymous visual field deficit that occurred at the time of the heart palpitations. The patient indicates that he felt as if his heart had stopped for several seconds around the time of visual disturbance. The patient actually had palpitations during this evaluation, heart rate was irregularly irregular, likely representing atrial fibrillation. The episode lasted less than 5 minutes. The patient will be set up for carotid Doppler study, MRI of the brain, and MRA of the head. He will be sent to cardiology with Dr. Percival Spanish, who he has had contact with before. The patient will likely need a prolonged cardiac monitor and 2-D echocardiogram. The patient is on aspirin therapy, but he may require anticoagulation. I will contact the patient when the results are available.  Jill Alexanders MD 08/09/2014 2:28 PM  [facility] [address] [place], [place] [address]  Phone [phone] Fax [phone]

## 2014-08-09 NOTE — Patient Instructions (Signed)
Stroke Prevention Some medical conditions and behaviors are associated with an increased chance of having a stroke. You may prevent a stroke by making healthy choices and managing medical conditions. HOW CAN I REDUCE MY RISK OF HAVING A STROKE?   Stay physically active. Get at least 30 minutes of activity on most or all days.  Do not smoke. It may also be helpful to avoid exposure to secondhand smoke.  Limit alcohol use. Moderate alcohol use is considered to be:  No more than 2 drinks per day for men.  No more than 1 drink per day for nonpregnant women.  Eat healthy foods. This involves:  Eating 5 or more servings of fruits and vegetables a day.  Making dietary changes that address high blood pressure (hypertension), high cholesterol, diabetes, or obesity.  Manage your cholesterol levels.  Making food choices that are high in fiber and low in saturated fat, trans fat, and cholesterol may control cholesterol levels.  Take any prescribed medicines to control cholesterol as directed by your health care provider.  Manage your diabetes.  Controlling your carbohydrate and sugar intake is recommended to manage diabetes.  Take any prescribed medicines to control diabetes as directed by your health care provider.  Control your hypertension.  Making food choices that are low in salt (sodium), saturated fat, trans fat, and cholesterol is recommended to manage hypertension.  Take any prescribed medicines to control hypertension as directed by your health care provider.  Maintain a healthy weight.  Reducing calorie intake and making food choices that are low in sodium, saturated fat, trans fat, and cholesterol are recommended to manage weight.  Stop drug abuse.  Avoid taking birth control pills.  Talk to your health care provider about the risks of taking birth control pills if you are over 35 years old, smoke, get migraines, or have ever had a blood clot.  Get evaluated for sleep  disorders (sleep apnea).  Talk to your health care provider about getting a sleep evaluation if you snore a lot or have excessive sleepiness.  Take medicines only as directed by your health care provider.  For some people, aspirin or blood thinners (anticoagulants) are helpful in reducing the risk of forming abnormal blood clots that can lead to stroke. If you have the irregular heart rhythm of atrial fibrillation, you should be on a blood thinner unless there is a good reason you cannot take them.  Understand all your medicine instructions.  Make sure that other conditions (such as anemia or atherosclerosis) are addressed. SEEK IMMEDIATE MEDICAL CARE IF:   You have sudden weakness or numbness of the face, arm, or leg, especially on one side of the body.  Your face or eyelid droops to one side.  You have sudden confusion.  You have trouble speaking (aphasia) or understanding.  You have sudden trouble seeing in one or both eyes.  You have sudden trouble walking.  You have dizziness.  You have a loss of balance or coordination.  You have a sudden, severe headache with no known cause.  You have new chest pain or an irregular heartbeat. Any of these symptoms may represent a serious problem that is an emergency. Do not wait to see if the symptoms will go away. Get medical help at once. Call your local emergency services (911 in [place] Do not drive yourself to the hospital. Document Released: 10/21/2004 Document Revised: 01/28/2014 Document Reviewed: 03/16/2013 [employer] Patient Information 2015 [employer]. This information is not intended to replace advice given   to you by your health care provider. Make sure you discuss any questions you have with your health care provider.  

## 2014-08-13 ENCOUNTER — Encounter: Payer: Self-pay | Admitting: Neurology

## 2014-08-24 ENCOUNTER — Ambulatory Visit
Admission: RE | Admit: 2014-08-24 | Discharge: 2014-08-24 | Disposition: A | Discharge status: Home / Self Care | Payer: 59 | Source: Ambulatory Visit | Attending: Neurology | Admitting: Neurology

## 2014-08-24 DIAGNOSIS — R51 Headache: Secondary | ICD-10-CM

## 2014-08-24 DIAGNOSIS — G4486 Cervicogenic headache: Secondary | ICD-10-CM

## 2014-08-24 DIAGNOSIS — G451 Carotid artery syndrome (hemispheric): Secondary | ICD-10-CM

## 2014-08-29 ENCOUNTER — Telehealth: Payer: Self-pay | Admitting: Neurology

## 2014-08-29 ENCOUNTER — Ambulatory Visit (INDEPENDENT_AMBULATORY_CARE_PROVIDER_SITE_OTHER): Payer: 59

## 2014-08-29 ENCOUNTER — Other Ambulatory Visit: Payer: 59

## 2014-08-29 DIAGNOSIS — R55 Syncope and collapse: Secondary | ICD-10-CM

## 2014-08-29 DIAGNOSIS — G451 Carotid artery syndrome (hemispheric): Secondary | ICD-10-CM

## 2014-08-29 DIAGNOSIS — G4486 Cervicogenic headache: Secondary | ICD-10-CM

## 2014-08-29 DIAGNOSIS — R51 Headache: Secondary | ICD-10-CM

## 2014-08-29 NOTE — Telephone Encounter (Signed)
I called patient. MRI the brain is unchanged from 2012, MRA of the head is unremarkable. The patient has not heard anything about the cardiology referral, I will send another referral.    MRI brain 08/24/2014:  IMPRESSION: Slightly abnormal MRI scan of the brain showing tiny subcortical nonspecific white matter hyperintensities bilaterally with differential discussed above. No enhancing lesions are noted. Overall no significant change compared with prior MRI brain dated 12/11/2010   MRA head 08/24/2014:  IMPRESSION: Unremarkable MRI of the brain showing no significant stenosis of the large and medium-sized intracranial vessels.

## 2014-09-02 ENCOUNTER — Telehealth: Payer: Self-pay | Admitting: Neurology

## 2014-09-02 NOTE — Telephone Encounter (Signed)
The carotid Doppler study is unremarkable. The patient will be seeing cardiology on 10/08/2013.

## 2014-09-23 ENCOUNTER — Other Ambulatory Visit: Payer: 59

## 2014-09-23 ENCOUNTER — Other Ambulatory Visit: Payer: Self-pay | Admitting: Family Medicine

## 2014-09-23 DIAGNOSIS — R7989 Other specified abnormal findings of blood chemistry: Secondary | ICD-10-CM

## 2014-09-23 NOTE — Progress Notes (Signed)
LAB ONLY 

## 2014-09-24 ENCOUNTER — Other Ambulatory Visit: Payer: Self-pay | Admitting: *Deleted

## 2014-09-24 LAB — VITAMIN D 25 HYDROXY (VIT D DEFICIENCY, FRACTURES): Vit D, 25-Hydroxy: 23.6 ng/mL — ABNORMAL LOW (ref 30.0–100.0)

## 2014-09-24 MED ORDER — SYNTHROID 175 MCG PO TABS: 175.0000 ug | ORAL_TABLET | Freq: Every day | ORAL | Status: DC

## 2014-09-26 LAB — BMP8+EGFR
BUN/Creatinine Ratio: 8 — ABNORMAL LOW (ref 10–22)
BUN: 11 mg/dL (ref 8–27)
CO2: 24 mmol/L (ref 18–29)
Calcium: 9.4 mg/dL (ref 8.6–10.2)
Chloride: 101 mmol/L (ref 97–108)
Creatinine, Ser: 1.39 mg/dL — ABNORMAL HIGH (ref 0.76–1.27)
GFR calc non Af Amer: 54 mL/min/{1.73_m2} — ABNORMAL LOW (ref >59)
GFR, EST AFRICAN AMERICAN: 62 mL/min/{1.73_m2} (ref >59)
GLUCOSE: 106 mg/dL — AB (ref 65–99)
POTASSIUM: 4.1 mmol/L (ref 3.5–5.2)
Sodium: 142 mmol/L (ref 134–144)

## 2014-09-26 LAB — SPECIMEN STATUS REPORT

## 2014-09-30 ENCOUNTER — Other Ambulatory Visit: Payer: Self-pay | Admitting: *Deleted

## 2014-09-30 DIAGNOSIS — R7989 Other specified abnormal findings of blood chemistry: Secondary | ICD-10-CM

## 2014-09-30 NOTE — Progress Notes (Signed)
BP today with nurse checking in office was --  142/ 84 p- 73   Per pt and [facility] - set up with nephrology Referral placed

## 2014-10-08 ENCOUNTER — Encounter: Payer: Self-pay | Admitting: Cardiology

## 2014-10-08 ENCOUNTER — Ambulatory Visit (INDEPENDENT_AMBULATORY_CARE_PROVIDER_SITE_OTHER): Payer: 59 | Admitting: Cardiology

## 2014-10-08 VITALS — BP 130/70 | HR 68 | Ht 72.0 in | Wt 208.9 lb

## 2014-10-08 DIAGNOSIS — I499 Cardiac arrhythmia, unspecified: Secondary | ICD-10-CM | POA: Insufficient documentation

## 2014-10-08 MED ORDER — AMLODIPINE BESYLATE 5 MG PO TABS: 7.5000 mg | ORAL_TABLET | Freq: Every day | ORAL | Status: DC

## 2014-10-08 NOTE — Addendum Note (Signed)
Addended by: Diana Eves on: 10/08/2014 05:42 PM   Modules accepted: Orders

## 2014-10-08 NOTE — Progress Notes (Signed)
HPI The patient presents for evaluation of possible atrial fibrillation. He recently had some blurred vision and was referred to Dr. Jannifer Franklin for neurology evaluation. I did review results of an MRI and carotids which were unrevealing. Dr. Jannifer Franklin DID not suggest that he had a TIA. However, his heart rate was irregular in the office and the question was could he have had atrial fibrillation. This was not recorded. He did have significant hypertension at the time of his event. He brings a blood pressure diary today and his systolics are in the 161W with diastolics typically above 90. Patient is very limited by back pain which is severe. He can do minimal activities. He does feel palpitations. However, he does not have chest pressure, neck or arm discomfort. He does not have shortness of breath, PND or orthopnea. He does not have weight gain or edema. He has had no further events such as tunnel vision that prompted his evaluation. He's not had any presyncope or syncope.  Of note I saw him many years ago when he was hospitalized with chest pain. He had no obstructive coronary disease on cath at that time.  Allergies  Allergen Reactions  . Robaxin [Methocarbamol] Other (See Comments)    Seeing "bright lights"  . Sudafed [Pseudoephedrine Hcl] Other (See Comments)    Increased heart rate  . Epinephrine Palpitations    Current Outpatient Prescriptions  Medication Sig Dispense Refill  . amLODipine (NORVASC) 5 MG tablet Take 1 tablet by mouth  daily 90 tablet 1  . aspirin 81 MG tablet Take 81 mg by mouth at bedtime.     Marland Kitchen BENICAR 40 MG tablet Take 1 tablet by mouth  daily 90 tablet 2  . clobetasol cream (TEMOVATE) 0.05 %   3  . Coenzyme Q10 (CO Q 10) 100 MG CAPS Take 1 capsule by mouth daily.    . fluticasone (CUTIVATE) 0.05 % cream   3  . omeprazole (PRILOSEC) 20 MG capsule Take 1 capsule by mouth  twice daily 180 capsule 3  . SYNTHROID 175 MCG tablet Take 1 tablet (175 mcg total) by mouth daily  before breakfast. 90 tablet 2   No current facility-administered medications for this visit.    Past Medical History  Diagnosis Date  . Hypertension   . Hypothyroidism   . Cerebrovascular disease   . Cervicogenic headache   . History of TIA (transient ischemic attack)     04/ 2011---  no residuals  . Acute meniscal tear of left knee   . BPH (benign prostatic hypertrophy)   . History of diverticulitis   . GERD (gastroesophageal reflux disease)   . Chronic neck pain     uses TENS unit  prn  . Decreased range of motion of neck   . Syrinx of spinal cord     C4 -- C7  . Allergic rhinitis   . Peripheral neuropathy   . Cervical spondylosis without myelopathy   . History of kidney stones   . Wears glasses   . H/O hiatal hernia     Past Surgical History  Procedure Laterality Date  . Hemorrhoid surgery  1992  . Laparoscopic cholecystectomy  01-30-2003  . Right ureteroscopic laser lithotripsy stone extraction /  stent placement  05-10-2000  . Cardiac catheterization  02-18-2010   dr Angelena Form    normal coronary arteries/  normal lvsf--  ef 65%  . Transthoracic echocardiogram  01-16-2010    normal lvf/  ef  60%/  mild lae  .  Negative sleep study  2013    per pt  . Removal forgein body from ear  AGE 64  . Knee arthroscopy Left 03/13/2014    Procedure: LEFT ARTHROSCOPY KNEE WITH DEBRIDMENT;  Surgeon: Gearlean Alf, MD;  Location: [facility];  Service: Orthopedics;  Laterality: Left;    Family History  Problem Relation Age of Onset  . Cancer Father     Prostate Cancer  . Heart disease Father   . Stroke Father   . Nephrolithiasis Sister   . Cancer Sister     Lung Cancer  . Hyperlipidemia Other     FH  . Heart disease Other     FH  . Stroke Other     FH  . Kidney disease Other   . Hypertension Other     FH  . Diabetes Other     Parent, Other Relative  . Heart disease Mother   . Cancer Sister     sinus  . COPD Sister   . Hyperlipidemia Sister   .  Hyperlipidemia Sister     History   Social History  . Marital Status: Married    Spouse Name: N/A    Number of Children: 2  . Years of Education: 12   Occupational History  .     Social History Main Topics  . Smoking status: Never Smoker   . Smokeless tobacco: Never Used  . Alcohol Use: Yes     Comment: rare  . Drug Use: No  . Sexual Activity: Not on file   Other Topics Concern  . Not on file   Social History Narrative    ROS:  Positive for headaches, vertigo, severe back pain.  10/08/2014  PHYSICAL EXAM BP 130/70 mmHg  Pulse 68  Ht 6' (1.829 m)  Wt 208 lb 14.4 oz (94.756 kg)  BMI 28.33 kg/m2  GENERAL:  Well appearing HEENT:  Pupils equal round and reactive, fundi not visualized, oral mucosa unremarkable NECK:  No jugular venous distention, waveform within normal limits, carotid upstroke brisk and symmetric, no bruits, no thyromegaly LYMPHATICS:  No cervical, inguinal adenopathy LUNGS:  Clear to auscultation bilaterally BACK:  No CVA tenderness CHEST:  Unremarkable HEART:  PMI not displaced or sustained,S1 and S2 within normal limits, no S3, no S4, no clicks, no rubs, no murmurs ABD:  Flat, positive bowel sounds normal in frequency in pitch, no bruits, no rebound, no guarding, no midline pulsatile mass, no hepatomegaly, no splenomegaly EXT:  2 plus pulses throughout, no edema, no cyanosis no clubbing SKIN:  No rashes no nodules NEURO:  Cranial nerves II through XII grossly intact, motor grossly intact throughout PSYCH:  Cognitively intact, oriented to person place and time  EKG:  Sinus rhythm, rate 68, low-voltage in limb leads, poor anterior R wave progression, lateral T-wave inversion.  This was not different than the EKG last November. 10/08/2014   ASSESSMENT AND PLAN   PALPITATIONS:  There was a question of atrial fibrillation I have no documentation of this. [name] will need a 21 day event monitor.  The patients symptoms necessitate an event  monitor.  The symptoms are too infrequent to be identified on a Holter monitor.    HTN:  His blood pressure is elevated. I will increase his Norvasc to 7.5 mg daily.

## 2014-10-08 NOTE — Patient Instructions (Signed)
Dr Percival Spanish has ordered the following test(s) to be done: 1. Cardiac Event Monitoring Your Doctor has ordered you to wear a heart monitor. You will wear this for 21 days.   TIPS -  REMINDERS 1. The sensor is the lanyard that is worn around your neck every day - this is powered by a battery that needs to be changed every day 2. The monitor is the device that allows you to record symptoms - this will need to be charged daily 3. The sensor & monitor need to be within 100 feet of each other at all times 4. The sensor connects to the electrodes (stickers) - these should be changed every 24-48 hours (you do not have to remove them when you bathe, just make sure they are dry when you connect it back to the sensor 5. If you need more supplies (electrodes, batteries), please call the 1-800 # on the back of the pamphlet and [employer] will mail you more supplies 6. If your skin becomes sensitive, please try the sample pack of sensitive skin electrodes (the white packet in your silver box) and call [employer] to have them mail you more of these type of electrodes 7. When you are finish wearing the monitor, please place all supplies back in the silver box, place the silver box in the pre-packaged UPS bag and drop off at [employer] or call them so they can come pick it up  Your physician has recommended making the following medication changes: INCREASE Amlodipine to 1.5 tablets (7.5 mg total) daily. A new prescription has been sent to [facility].  Dr Percival Spanish wants you to follow-up in [place] after you have completed your monitor. You will receive a reminder letter in the mail one months in advance. If you don't receive a letter, please call our office to schedule the follow-up appointment.   Cardiac Event Monitoring A cardiac event monitor is a small recording device used to help detect abnormal heart rhythms (arrhythmias). The monitor is used to record heart rhythm when noticeable symptoms such as the following  occur:  Fast heartbeats (palpitations), such as heart racing or fluttering.  Dizziness.  Fainting or light-headedness.  Unexplained weakness. The monitor is wired to two electrodes placed on your chest. Electrodes are flat, sticky disks that attach to your skin. The monitor can be worn for up to 30 days. You will wear the monitor at all times, except when bathing.  HOW TO USE YOUR CARDIAC EVENT MONITOR A technician will prepare your chest for the electrode placement. The technician will show you how to place the electrodes, how to work the monitor, and how to replace the batteries. Take time to practice using the monitor before you leave the office. Make sure you understand how to send the information from the monitor to your health care provider. This requires a telephone with a landline, not a cell phone. You need to:  Wear your monitor at all times, except when you are in water:  Do not get the monitor wet.  Take the monitor off when bathing. Do not swim or use a hot tub with it on.  Keep your skin clean. Do not put body lotion or moisturizer on your chest.  Change the electrodes daily or any time they stop sticking to your skin. You might need to use tape to keep them on.  It is possible that your skin under the electrodes could become irritated. To keep this from happening, try to put the electrodes in slightly different  places on your chest. However, they must remain in the area under your left breast and in the upper right section of your chest.  Make sure the monitor is safely clipped to your clothing or in a location close to your body that your health care provider recommends.  Press the button to record when you feel symptoms of heart trouble, such as dizziness, weakness, light-headedness, palpitations, thumping, shortness of breath, unexplained weakness, or a fluttering or racing heart. The monitor is always on and records what happened slightly before you pressed the button,  so do not worry about being too late to get good information.  Keep a diary of your activities, such as walking, doing chores, and taking medicine. It is especially important to note what you were doing when you pushed the button to record your symptoms. This will help your health care provider determine what might be contributing to your symptoms. The information stored in your monitor will be reviewed by your health care provider alongside your diary entries.  Send the recorded information as recommended by your health care provider. It is important to understand that it will take some time for your health care provider to process the results.  Change the batteries as recommended by your health care provider. SEEK IMMEDIATE MEDICAL CARE IF:   You have chest pain.  You have extreme difficulty breathing or shortness of breath.  You develop a very fast heartbeat that persists.  You develop dizziness that does not go away.  You faint or constantly feel you are about to faint. Document Released: 06/22/2008 Document Revised: 01/28/2014 Document Reviewed: 03/12/2013 [facility] Patient Information 2015 [place], [place]. This information is not intended to replace advice given to you by your health care provider. Make sure you discuss any questions you have with your health care provider.

## 2014-10-22 ENCOUNTER — Other Ambulatory Visit (INDEPENDENT_AMBULATORY_CARE_PROVIDER_SITE_OTHER): Payer: 59

## 2014-10-22 DIAGNOSIS — K219 Gastro-esophageal reflux disease without esophagitis: Secondary | ICD-10-CM

## 2014-10-22 DIAGNOSIS — N400 Enlarged prostate without lower urinary tract symptoms: Secondary | ICD-10-CM

## 2014-10-22 DIAGNOSIS — I10 Essential (primary) hypertension: Secondary | ICD-10-CM

## 2014-10-22 DIAGNOSIS — E559 Vitamin D deficiency, unspecified: Secondary | ICD-10-CM

## 2014-10-22 DIAGNOSIS — E785 Hyperlipidemia, unspecified: Secondary | ICD-10-CM

## 2014-10-22 DIAGNOSIS — E349 Endocrine disorder, unspecified: Secondary | ICD-10-CM

## 2014-10-22 NOTE — Progress Notes (Signed)
Lab only 

## 2014-10-22 NOTE — Addendum Note (Signed)
Addended by: Selmer Dominion on: 10/22/2014 02:44 PM   Modules accepted: Orders

## 2014-10-22 NOTE — Addendum Note (Signed)
Addended by: Selmer Dominion on: 10/22/2014 03:11 PM   Modules accepted: Orders

## 2014-10-23 LAB — CBC WITH DIFFERENTIAL/PLATELET
Basophils Absolute: 0 10*3/uL (ref 0.0–0.2)
Basos: 1 %
EOS ABS: 0.1 10*3/uL (ref 0.0–0.4)
Eos: 2 %
HCT: 52 % — ABNORMAL HIGH (ref 37.5–51.0)
HEMOGLOBIN: 18 g/dL — AB (ref 12.6–17.7)
IMMATURE GRANULOCYTES: 0 %
Immature Grans (Abs): 0 10*3/uL (ref 0.0–0.1)
LYMPHS ABS: 1.7 10*3/uL (ref 0.7–3.1)
Lymphs: 22 %
MCH: 32.8 pg (ref 26.6–33.0)
MCHC: 34.6 g/dL (ref 31.5–35.7)
MCV: 95 fL (ref 79–97)
Monocytes Absolute: 0.7 10*3/uL (ref 0.1–0.9)
Monocytes: 9 %
NEUTROS PCT: 66 %
Neutrophils Absolute: 5.1 10*3/uL (ref 1.4–7.0)
PLATELETS: 228 10*3/uL (ref 150–379)
RBC: 5.48 x10E6/uL (ref 4.14–5.80)
RDW: 13.4 % (ref 12.3–15.4)
WBC: 7.8 10*3/uL (ref 3.4–10.8)

## 2014-10-23 LAB — LIPID PANEL
CHOL/HDL RATIO: 4.4 ratio (ref 0.0–5.0)
CHOLESTEROL TOTAL: 179 mg/dL (ref 100–199)
HDL: 41 mg/dL (ref >39)
LDL CALC: 110 mg/dL — AB (ref 0–99)
Triglycerides: 138 mg/dL (ref 0–149)
VLDL Cholesterol Cal: 28 mg/dL (ref 5–40)

## 2014-10-23 LAB — BMP8+EGFR
BUN / CREAT RATIO: 12 (ref 10–22)
BUN: 16 mg/dL (ref 8–27)
CALCIUM: 9.5 mg/dL (ref 8.6–10.2)
CO2: 26 mmol/L (ref 18–29)
Chloride: 102 mmol/L (ref 97–108)
Creatinine, Ser: 1.39 mg/dL — ABNORMAL HIGH (ref 0.76–1.27)
GFR calc non Af Amer: 54 mL/min/{1.73_m2} — ABNORMAL LOW (ref >59)
GFR, EST AFRICAN AMERICAN: 62 mL/min/{1.73_m2} (ref >59)
Glucose: 106 mg/dL — ABNORMAL HIGH (ref 65–99)
POTASSIUM: 4.9 mmol/L (ref 3.5–5.2)
Sodium: 145 mmol/L — ABNORMAL HIGH (ref 134–144)

## 2014-10-23 LAB — HEPATIC FUNCTION PANEL
ALBUMIN: 4.4 g/dL (ref 3.6–4.8)
ALT: 28 IU/L (ref 0–44)
AST: 16 IU/L (ref 0–40)
Alkaline Phosphatase: 109 IU/L (ref 39–117)
BILIRUBIN TOTAL: 1.2 mg/dL (ref 0.0–1.2)
Bilirubin, Direct: 0.22 mg/dL (ref 0.00–0.40)
Total Protein: 6.5 g/dL (ref 6.0–8.5)

## 2014-10-24 ENCOUNTER — Encounter: Payer: Self-pay | Admitting: Family Medicine

## 2014-10-28 DIAGNOSIS — R002 Palpitations: Secondary | ICD-10-CM

## 2014-11-06 ENCOUNTER — Ambulatory Visit (INDEPENDENT_AMBULATORY_CARE_PROVIDER_SITE_OTHER): Payer: 59 | Admitting: Cardiology

## 2014-11-06 ENCOUNTER — Encounter: Payer: Self-pay | Admitting: Cardiology

## 2014-11-06 ENCOUNTER — Encounter: Payer: Self-pay | Admitting: Family Medicine

## 2014-11-06 ENCOUNTER — Ambulatory Visit (HOSPITAL_COMMUNITY)
Admission: RE | Admit: 2014-11-06 | Discharge: 2014-11-06 | Disposition: A | Discharge status: Home / Self Care | Payer: 59 | Source: Ambulatory Visit | Attending: Family Medicine | Admitting: Family Medicine

## 2014-11-06 ENCOUNTER — Ambulatory Visit (INDEPENDENT_AMBULATORY_CARE_PROVIDER_SITE_OTHER): Payer: 59 | Admitting: Family Medicine

## 2014-11-06 VITALS — BP 126/78 | HR 89 | Temp 98.2°F | Ht 72.0 in | Wt 207.0 lb

## 2014-11-06 VITALS — BP 120/84 | HR 92 | Ht 72.0 in | Wt 208.0 lb

## 2014-11-06 DIAGNOSIS — R002 Palpitations: Secondary | ICD-10-CM

## 2014-11-06 DIAGNOSIS — R1030 Lower abdominal pain, unspecified: Secondary | ICD-10-CM

## 2014-11-06 DIAGNOSIS — K5732 Diverticulitis of large intestine without perforation or abscess without bleeding: Secondary | ICD-10-CM | POA: Diagnosis not present

## 2014-11-06 LAB — POCT CBC
GRANULOCYTE PERCENT: 81.1 % — AB (ref 37–80)
HEMATOCRIT: 54.5 % — AB (ref 43.5–53.7)
HEMOGLOBIN: 16.9 g/dL (ref 14.1–18.1)
Lymph, poc: 1.6 (ref 0.6–3.4)
MCH, POC: 30.1 pg (ref 27–31.2)
MCHC: 31 g/dL — AB (ref 31.8–35.4)
MCV: 96.9 fL (ref 80–97)
MPV: 9.2 fL (ref 0–99.8)
PLATELET COUNT, POC: 244 10*3/uL (ref 142–424)
POC GRANULOCYTE: 8.1 — AB (ref 2–6.9)
POC LYMPH PERCENT: 16.2 %L (ref 10–50)
RBC: 5.6 M/uL (ref 4.69–6.13)
RDW, POC: 13.4 %
WBC: 10 10*3/uL (ref 4.6–10.2)

## 2014-11-06 LAB — POCT UA - MICROSCOPIC ONLY
CASTS, UR, LPF, POC: NEGATIVE
Crystals, Ur, HPF, POC: NEGATIVE
Mucus, UA: NEGATIVE
RBC, urine, microscopic: NEGATIVE
YEAST UA: NEGATIVE

## 2014-11-06 LAB — POCT URINALYSIS DIPSTICK
Bilirubin, UA: NEGATIVE
Glucose, UA: NEGATIVE
Ketones, UA: NEGATIVE
Leukocytes, UA: NEGATIVE
Nitrite, UA: NEGATIVE
PH UA: 6
PROTEIN UA: NEGATIVE
RBC UA: NEGATIVE
SPEC GRAV UA: 1.025
Urobilinogen, UA: NEGATIVE

## 2014-11-06 MED ORDER — AMLODIPINE BESYLATE 10 MG PO TABS: 10.0000 mg | ORAL_TABLET | Freq: Every day | ORAL | Status: DC

## 2014-11-06 MED ORDER — AMOXICILLIN-POT CLAVULANATE 875-125 MG PO TABS: 1.0000 | ORAL_TABLET | Freq: Three times a day (TID) | ORAL | Status: DC

## 2014-11-06 NOTE — Patient Instructions (Signed)
Ct of abdomen for LLQ abd pain Avoid milk cheese ice cream and dairy products Avoid high fiber nuts and popcorn Take antibiotic as directed  Call tomorrow and let us know how you're feeling Return to clinic in 5-7 days

## 2014-11-06 NOTE — Patient Instructions (Signed)
Please increase your Amlodipine to 10 mg a day.  Continue all other medications as listed.  Follow up in 6 months with Dr. Percival Spanish in [place].  You will receive a letter in the mail 2 months before you are due.  Please call us when you receive this letter to schedule your follow up appointment.  Thank you for choosing [facility]!!

## 2014-11-06 NOTE — Progress Notes (Signed)
Subjective:    Patient ID: Jacob Kerr, male    DOB: 06-22-1951, 64 y.o.   MRN: [id number]  HPI 64 year old male comes in today with 2 day complaint of lower abdominal pain. He does have a history of diverticulitis.  He did notice some bright red blood in his stool prior to onset of pain. Patient Active Problem List   Diagnosis Date Noted  . Arrhythmia 10/08/2014  . TIA (transient ischemic attack) 08/09/2014  . Lateral meniscal tear 03/12/2014  . Testosterone deficiency 07/04/2013  . Cervicogenic headache 03/28/2013  . Vitamin D deficiency 01/01/2013  . Hyperlipidemia 01/01/2013  . BPH (benign prostatic hyperplasia) 11/03/2012  . Diverticulosis large intestine w/o perforation or abscess w/bleeding 11/03/2012  . Peripheral neuropathy 11/03/2012  . Unspecified hypothyroidism 01/18/2011  . Unspecified essential hypertension 01/18/2011  . GERD (gastroesophageal reflux disease) 01/18/2011  . Cervical spondylosis without myelopathy 11/24/2010  . Headache(784.0) 11/24/2010  . Sleep disturbance, unspecified 11/24/2010  . Syringomyelia and syringobulbia 11/24/2010  . Disturbance of skin sensation 11/24/2010  . Cerebrovascular disease, unspecified 11/24/2010   Outpatient Encounter Prescriptions as of 11/06/2014  Medication Sig  . amLODipine (NORVASC) 10 MG tablet Take 1 tablet (10 mg total) by mouth daily.  Marland Kitchen aspirin 81 MG tablet Take 81 mg by mouth at bedtime.   Marland Kitchen BENICAR 40 MG tablet Take 1 tablet by mouth  daily  . clobetasol cream (TEMOVATE) 7.82 % Apply 1 application topically daily.   . Coenzyme Q10 (CO Q 10) 100 MG CAPS Take 1 capsule by mouth daily.  . fluticasone (CUTIVATE) 0.05 % cream Apply 1 application topically daily.   Marland Kitchen omeprazole (PRILOSEC) 20 MG capsule Take 1 capsule by mouth  twice daily  . SYNTHROID 175 MCG tablet Take 1 tablet (175 mcg total) by mouth daily before breakfast.     Review of Systems  Constitutional: Negative.  Negative for fever and chills.    HENT: Negative.   Eyes: Negative.   Respiratory: Negative.   Cardiovascular: Negative.   Gastrointestinal: Positive for abdominal pain (lower abd pain), diarrhea (intermittent), blood in stool (small amount of bright red blood) and abdominal distention. Negative for nausea, vomiting, constipation, anal bleeding and rectal pain.  Endocrine: Negative.   Genitourinary: Negative.   Musculoskeletal: Negative.   Skin: Negative.   Allergic/Immunologic: Negative.   Neurological: Negative.   Hematological: Negative.   Psychiatric/Behavioral: Negative.        Objective:   Physical Exam  Constitutional: He is oriented to person, place, and time. He appears well-developed and well-nourished. He appears distressed.  HENT:  Head: Normocephalic and atraumatic.  Nose: Nose normal.  Eyes: Conjunctivae and EOM are normal. Pupils are equal, round, and reactive to light. Right eye exhibits no discharge. Left eye exhibits no discharge. No scleral icterus.  Neck: Normal range of motion. Neck supple. No thyromegaly present.  Cardiovascular: Normal rate, regular rhythm and normal heart sounds.   No murmur heard. The heart had a regular rate and rhythm at 72/m  Pulmonary/Chest: Effort normal and breath sounds normal. No respiratory distress. He has no wheezes. He has no rales. He exhibits no tenderness.  Abdominal: Soft. Bowel sounds are normal. He exhibits no mass. There is tenderness. There is guarding. There is no rebound.  There is epigastric. Umbilical and especially left lower quadrant tenderness and suprapubic tenderness  Musculoskeletal: He exhibits tenderness. He exhibits no edema.  Range of motion is limited because of chronic neck and back pain secondary to syrinx  Lymphadenopathy:    He has no cervical adenopathy.  Neurological: He is alert and oriented to person, place, and time.  Skin: Skin is warm and dry. No rash noted. No erythema. No pallor.  Psychiatric: He has a normal mood and  affect. His behavior is normal. Judgment and thought content normal.  Nursing note and vitals reviewed.   BP 126/78 mmHg  Pulse 89  Temp(Src) 98.2 F (36.8 C) (Oral)  Ht 6' (1.829 m)  Wt 207 lb (93.895 kg)  BMI 28.07 kg/m2 Results for orders placed or performed in visit on 11/06/14  POCT CBC  Result Value Ref Range   WBC 10.0 4.6 - 10.2 K/uL   Lymph, poc 1.6 0.6 - 3.4   POC LYMPH PERCENT 16.2 10 - 50 %L   POC Granulocyte 8.1 (A) 2 - 6.9   Granulocyte percent 81.1 (A) 37 - 80 %G   RBC 5.6 4.69 - 6.13 M/uL   Hemoglobin 16.9 14.1 - 18.1 g/dL   HCT, POC 54.5 (A) 43.5 - 53.7 %   MCV 96.9 80 - 97 fL   MCH, POC 30.1 27 - 31.2 pg   MCHC 31.0 (A) 31.8 - 35.4 g/dL   RDW, POC 13.4 %   Platelet Count, POC 244.0 142 - 424 K/uL   MPV 9.2 0 - 99.8 fL  POCT urinalysis dipstick  Result Value Ref Range   Color, UA gold    Clarity, UA clear    Glucose, UA neg    Bilirubin, UA neg    Ketones, UA neg    Spec Grav, UA 1.025    Blood, UA neg    pH, UA 6.0    Protein, UA neg    Urobilinogen, UA negative    Nitrite, UA neg    Leukocytes, UA Negative   POCT UA - Microscopic Only  Result Value Ref Range   WBC, Ur, HPF, POC 1-3    RBC, urine, microscopic neg    Bacteria, U Microscopic occ    Mucus, UA neg    Epithelial cells, urine per micros few    Crystals, Ur, HPF, POC neg    Casts, Ur, LPF, POC neg    Yeast, UA neg          Assessment & Plan:  1. Lower abdominal pain -Get CT scan as planned and have a low fiber diet with lots of fluids, avoid nuts popcorn etc. - POCT CBC - BMP8+EGFR - Lipid panel - POCT urinalysis dipstick - POCT UA - Microscopic Only - CT Abdomen Pelvis Wo Contrast; Future - amoxicillin-clavulanate (AUGMENTIN) 875-125 MG per tablet; Take 1 tablet by mouth 3 (three) times daily.  Dispense: 21 tablet; Refill: 0 - Ambulatory referral to Gastroenterology  Patient Instructions  Ct of abdomen for LLQ abd pain Avoid milk cheese ice cream and dairy  products Avoid high fiber nuts and popcorn Take antibiotic as directed  Call tomorrow and let us know how you're feeling Return to clinic in 5-7 days    Arrie Senate MD

## 2014-11-06 NOTE — Progress Notes (Signed)
HPI The patient presents for evaluation of possible atrial fibrillation and known hypertension.Marland Kitchen He recently had some blurred vision and was referred to Dr. Jannifer Franklin for neurology evaluation.  He had been seeing him Dr. Jannifer Franklin did not suggest that he had a TIA. However, his heart rate was irregular in the office and the question was could he have had atrial fibrillation. This was not recorded. He has worn an event monitor as we have not previously recorded fibrillation.  Allergies  Allergen Reactions  . Robaxin [Methocarbamol] Other (See Comments)    Seeing "bright lights"  . Sudafed [Pseudoephedrine Hcl] Other (See Comments)    Increased heart rate  . Epinephrine Palpitations    Current Outpatient Prescriptions  Medication Sig Dispense Refill  . amLODipine (NORVASC) 5 MG tablet Take 1.5 tablets (7.5 mg total) by mouth daily. 135 tablet 3  . aspirin 81 MG tablet Take 81 mg by mouth at bedtime.     Marland Kitchen BENICAR 40 MG tablet Take 1 tablet by mouth  daily 90 tablet 2  . Coenzyme Q10 (CO Q 10) 100 MG CAPS Take 1 capsule by mouth daily.    Marland Kitchen omeprazole (PRILOSEC) 20 MG capsule Take 1 capsule by mouth  twice daily 180 capsule 3  . SYNTHROID 175 MCG tablet Take 1 tablet (175 mcg total) by mouth daily before breakfast. 90 tablet 2  . clobetasol cream (TEMOVATE) 0.93 % Apply 1 application topically daily.   3  . fluticasone (CUTIVATE) 0.05 % cream Apply 1 application topically daily.   3   No current facility-administered medications for this visit.    Past Medical History  Diagnosis Date  . Hypertension   . Hypothyroidism   . Cervicogenic headache   . History of TIA (transient ischemic attack)     04/ 2011---  no residuals  . Acute meniscal tear of left knee   . BPH (benign prostatic hypertrophy)   . History of diverticulitis   . GERD (gastroesophageal reflux disease)   . Chronic neck pain     uses TENS unit  prn  . Syrinx of spinal cord     C4 -- C7  . Allergic rhinitis   .  Peripheral neuropathy   . Cervical spondylosis without myelopathy   . History of kidney stones   . H/O hiatal hernia     Past Surgical History  Procedure Laterality Date  . Hemorrhoid surgery  1992  . Laparoscopic cholecystectomy  01-30-2003  . Right ureteroscopic laser lithotripsy stone extraction /  stent placement  05-10-2000  . Cardiac catheterization  02-18-2010   dr Angelena Form    normal coronary arteries/  normal lvsf--  ef 65%  . Transthoracic echocardiogram  01-16-2010    normal lvf/  ef  60%/  mild lae  . Negative sleep study  2013    per pt  . Removal forgein body from ear  AGE 53  . Knee arthroscopy Left 03/13/2014    Procedure: LEFT ARTHROSCOPY KNEE WITH DEBRIDMENT;  Surgeon: Gearlean Alf, MD;  Location: [facility];  Service: Orthopedics;  Laterality: Left;   ROS:   Otherwise as stated in the HPI and negative for all other systems.  PHYSICAL EXAM BP 120/84 mmHg  Pulse 92  Ht 6' (1.829 m)  Wt 208 lb (94.348 kg)  BMI 28.20 kg/m2  GENERAL:  Well appearing NECK:  No jugular venous distention, waveform within normal limits, carotid upstroke brisk and symmetric, no bruits, no thyromegaly LUNGS:  Clear to  auscultation bilaterally CHEST:  Unremarkable HEART:  PMI not displaced or sustained,S1 and S2 within normal limits, no S3, no S4, no clicks, no rubs, no murmurs ABD:  Flat, positive bowel sounds normal in frequency in pitch, no bruits, no rebound, no guarding, no midline pulsatile mass, no hepatomegaly, no splenomegaly EXT:  2 plus pulses throughout, no edema, no cyanosis no clubbing     ASSESSMENT AND PLAN   PALPITATIONS:  On his 21 day event monitor I have only a couple of strips. He only gets about once or twice for very brief palpitations. There was a 4 beat run of atrial tachycardia that could represent atrial fibrillation but nothing else. He actually says his palpitations are much improved. At this point I do not think that this obviously  indicates the need for anticoagulation systemically. We discussed this. I think the risks benefits would not favor starting this at this point.   HTN:  His blood pressure is elevated. I will increase his Norvasc to 10 mg daily.

## 2014-11-07 LAB — BMP8+EGFR
BUN/Creatinine Ratio: 9 — ABNORMAL LOW (ref 10–22)
BUN: 11 mg/dL (ref 8–27)
CO2: 27 mmol/L (ref 18–29)
Calcium: 9.1 mg/dL (ref 8.6–10.2)
Chloride: 99 mmol/L (ref 97–108)
Creatinine, Ser: 1.26 mg/dL (ref 0.76–1.27)
GFR calc non Af Amer: 60 mL/min/{1.73_m2} (ref >59)
GFR, EST AFRICAN AMERICAN: 70 mL/min/{1.73_m2} (ref >59)
Glucose: 87 mg/dL (ref 65–99)
Potassium: 4.1 mmol/L (ref 3.5–5.2)
Sodium: 141 mmol/L (ref 134–144)

## 2014-11-07 LAB — LIPID PANEL
CHOL/HDL RATIO: 5.5 ratio — AB (ref 0.0–5.0)
Cholesterol, Total: 177 mg/dL (ref 100–199)
HDL: 32 mg/dL — ABNORMAL LOW (ref >39)
LDL Calculated: 101 mg/dL — ABNORMAL HIGH (ref 0–99)
Triglycerides: 219 mg/dL — ABNORMAL HIGH (ref 0–149)
VLDL Cholesterol Cal: 44 mg/dL — ABNORMAL HIGH (ref 5–40)

## 2014-11-19 ENCOUNTER — Encounter: Payer: Self-pay | Admitting: Cardiology

## 2014-11-19 ENCOUNTER — Ambulatory Visit (INDEPENDENT_AMBULATORY_CARE_PROVIDER_SITE_OTHER): Payer: 59 | Admitting: Physician Assistant

## 2014-11-19 ENCOUNTER — Encounter: Payer: Self-pay | Admitting: Physician Assistant

## 2014-11-19 VITALS — BP 120/80 | HR 80 | Ht 72.0 in | Wt 207.6 lb

## 2014-11-19 DIAGNOSIS — K5732 Diverticulitis of large intestine without perforation or abscess without bleeding: Secondary | ICD-10-CM

## 2014-11-19 DIAGNOSIS — K625 Hemorrhage of anus and rectum: Secondary | ICD-10-CM

## 2014-11-19 MED ORDER — NA SULFATE-K SULFATE-MG SULF 17.5-3.13-1.6 GM/177ML PO SOLN: ORAL | Status: DC

## 2014-11-19 MED ORDER — METRONIDAZOLE 500 MG PO TABS: ORAL_TABLET | ORAL | Status: DC

## 2014-11-19 MED ORDER — AMOXICILLIN-POT CLAVULANATE 875-125 MG PO TABS: 1.0000 | ORAL_TABLET | Freq: Two times a day (BID) | ORAL | Status: DC

## 2014-11-19 NOTE — Progress Notes (Signed)
Patient ID: Jacob GONSALEZ, male   DOB: 10/23/1950, 64 y.o.   MRN: [id number]   Subjective:    Patient ID: Jacob Kerr, male    DOB: 09-19-1951, 64 y.o.   MRN: [id number]  HPI Fender is a pleasant 64 year old white male known remotely to Dr. Jarold Motto. He had undergone colonoscopy last in November 2006 and was noted to have left colon diverticulosis and an otherwise normal exam. Patient has been treated in the past for diverticulitis. Other medical problems include hypothyroidism, hypertension, GERD, BPH, peripheral neuropathy and a cervical spondylosis, prior TIA. Patient had been seen by his primary care provider Dr. Vernon Prey on February 10 after he developed 2 day history of lower abdominal pain which he says was fairly intense. He had labs done showing WBC of 10 hemoglobin of 16.9 and CT of the abdomen and pelvis was done confirming a sigmoid diverticulitis - also had a prominent sigmoid diverticulum versus small  microperforation along the medial aspect of the sigmoid colon. He was treated with a seven-day course of Augmentin. He is referred here because he also had some small-volume rectal bleeding. Patient states that he is 90% better but still has some mild tenderness in his left colon. Also during the time that he was having pain he was passing some small amounts of bright red blood with bowel movements. He says he saw blood in the commode and possibly mixed in with the stool. He did not have any clots or melena. He has not seen any blood now over the past week. He has had no fever or chills no nausea or vomiting in appetite has been fine.He does take a baby aspirin daily, no regular NSAIDs.  Review of Systems Pertinent positive and negative review of systems were noted in the above HPI section.  All other review of systems was otherwise negative.  Outpatient Encounter Prescriptions as of 11/19/2014  Medication Sig  . amLODipine (NORVASC) 10 MG tablet Take 1 tablet (10 mg total) by mouth  daily.  Marland Kitchen aspirin 81 MG tablet Take 81 mg by mouth at bedtime.   Marland Kitchen BENICAR 40 MG tablet Take 1 tablet by mouth  daily  . clobetasol cream (TEMOVATE) 0.05 % Apply 1 application topically daily.   . Coenzyme Q10 (CO Q 10) 100 MG CAPS Take 1 capsule by mouth daily.  . fluticasone (CUTIVATE) 0.05 % cream Apply 1 application topically daily.   Marland Kitchen omeprazole (PRILOSEC) 20 MG capsule Take 1 capsule by mouth  twice daily  . SYNTHROID 175 MCG tablet Take 1 tablet (175 mcg total) by mouth daily before breakfast.  . amoxicillin-clavulanate (AUGMENTIN) 875-125 MG per tablet Take 1 tablet by mouth 2 (two) times daily.  . metroNIDAZOLE (FLAGYL) 500 MG tablet Take 1 tab 2 times daily with food.  . Na Sulfate-K Sulfate-Mg Sulf SOLN Take as directed.  . [DISCONTINUED] amoxicillin-clavulanate (AUGMENTIN) 875-125 MG per tablet Take 1 tablet by mouth 3 (three) times daily.   Allergies  Allergen Reactions  . Robaxin [Methocarbamol] Other (See Comments)    Seeing "bright lights"  . Sudafed [Pseudoephedrine Hcl] Other (See Comments)    Increased heart rate  . Epinephrine Palpitations   Patient Active Problem List   Diagnosis Date Noted  . Arrhythmia 10/08/2014  . TIA (transient ischemic attack) 08/09/2014  . Lateral meniscal tear 03/12/2014  . Testosterone deficiency 07/04/2013  . Cervicogenic headache 03/28/2013  . Vitamin D deficiency 01/01/2013  . Hyperlipidemia 01/01/2013  . BPH (benign prostatic hyperplasia) 11/03/2012  .  Diverticulosis large intestine w/o perforation or abscess w/bleeding 11/03/2012  . Peripheral neuropathy 11/03/2012  . Unspecified hypothyroidism 01/18/2011  . Unspecified essential hypertension 01/18/2011  . GERD (gastroesophageal reflux disease) 01/18/2011  . Cervical spondylosis without myelopathy 11/24/2010  . Headache(784.0) 11/24/2010  . Sleep disturbance, unspecified 11/24/2010  . Syringomyelia and syringobulbia 11/24/2010  . Disturbance of skin sensation 11/24/2010  .  Cerebrovascular disease, unspecified 11/24/2010   History   Social History  . Marital Status: Married    Spouse Name: N/A  . Number of Children: 2  . Years of Education: 12   Occupational History  . Retired    Social History Main Topics  . Smoking status: Never Smoker   . Smokeless tobacco: Never Used  . Alcohol Use: Yes     Comment: rare  . Drug Use: No  . Sexual Activity: Not on file   Other Topics Concern  . Not on file   Social History Narrative   Lives at home with wife.     Mr. Pride family history includes COPD in an other family member; Cancer in his father, sister, and sister; Heart disease in his father; Hyperlipidemia in his sister and sister; Nephrolithiasis in his sister; Stroke in his father.      Objective:    Filed Vitals:   11/19/14 1014  BP: 120/80  Pulse: 80    Physical Exam  well-developed white male in no acute distress, pleasant blood pressure 120/80 pulse 80 height 6 foot weight 207. HEENT; nontraumatic normocephalic EOMI PERRLA sclera anicteric, Supple; no JVD, Cardiovascular; regular rate and rhythm with S1-S2 no murmur or gallop, Pulmonary; clear bilaterally, Abdomen ;soft bowel sounds are present mass or hepatosplenomegaly, he is tender in the left lower quadrant and mildly in the suprapubic area there is no guarding or rebound, Rectal;exam not done, 70s no clubbing cyanosis or edema skin warm and dry, Psych ;mood and affect appropriate       Assessment & Plan:   #1 64 yo male with partially treated acute diverticulitis- Ct suggested possible small microperforation 210/16. He is much improved but has some residual tenderness #2 hematochezia-small volume- ?related to diverticuiltis /segmental  colitis (SCAD),r/o occult lesion #3 colon neoplasia surveillance-last colon 11/06 #4GERD #5BPH #6HTN #7 hx TIA #8 cervical  Myelopathy  Plan; we will treat with another ten-day course of antibiotics. Augmentin 875 by mouth twice a day 10 days  and Flagyl 500 mg by mouth twice a day 10 days. Schedule for colonoscopy with Dr. Arlyce Dice in 4-5 weeks to allow time for diverticulitis 2 completely resolved. Procedure was discussed in detail with patient and he is agreeable to proceed. He is advised to call should he develop any worsening of symptoms in the interim and also to call if his pain is not 100% resolved prior to the colonoscopy and at that time it will need a more prolonged course of antibiotics and perhaps repeat imaging.  Cc;Dr Vernon Prey  Bricen Victory S Ronte Parker PA-C 11/19/2014

## 2014-11-19 NOTE — Patient Instructions (Signed)
We sent prescriptions to [facility]. 1. Flagyl  ( Metronidazole) 500 mg 2. Augmentin ( Amonxicillin-clavulanate) 875-125 mg.   Call us after finished with antibiotics if symptoms are not resolved or if pain gets worse.   You have been scheduled for a colonoscopy. Please follow written instructions given to you at your visit today.  Please pick up your prep kit at the pharmacy within the next 1-3 days. If you use inhalers (even only as needed), please bring them with you on the day of your procedure. Your physician has requested that you go to www.startemmi.com and enter the access code given to you at your visit today. This web site gives a general overview about your procedure. However, you should still follow specific instructions given to you by our office regarding your preparation for the procedure.

## 2014-11-25 NOTE — Progress Notes (Signed)
Reviewed and agree with management. Kaytlynne Neace D. Christa Fasig, M.D., FACG  

## 2014-11-28 ENCOUNTER — Telehealth: Payer: Self-pay | Admitting: Physician Assistant

## 2014-11-28 NOTE — Telephone Encounter (Signed)
Pt states he is having pain again and the bleeding has started again. Pt scheduled to see Nicoletta Ba PA tomorrow at 2pm. Pt aware of appt.

## 2014-11-29 ENCOUNTER — Ambulatory Visit (INDEPENDENT_AMBULATORY_CARE_PROVIDER_SITE_OTHER): Payer: 59 | Admitting: Physician Assistant

## 2014-11-29 ENCOUNTER — Encounter: Payer: Self-pay | Admitting: Physician Assistant

## 2014-11-29 ENCOUNTER — Ambulatory Visit (INDEPENDENT_AMBULATORY_CARE_PROVIDER_SITE_OTHER)
Admission: RE | Admit: 2014-11-29 | Discharge: 2014-11-29 | Disposition: A | Discharge status: Home / Self Care | Payer: 59 | Source: Ambulatory Visit | Attending: Cardiology | Admitting: Cardiology

## 2014-11-29 ENCOUNTER — Other Ambulatory Visit (INDEPENDENT_AMBULATORY_CARE_PROVIDER_SITE_OTHER): Payer: 59

## 2014-11-29 VITALS — BP 136/84 | HR 76 | Ht 70.75 in | Wt 208.0 lb

## 2014-11-29 DIAGNOSIS — R1011 Right upper quadrant pain: Secondary | ICD-10-CM

## 2014-11-29 DIAGNOSIS — K5792 Diverticulitis of intestine, part unspecified, without perforation or abscess without bleeding: Secondary | ICD-10-CM

## 2014-11-29 DIAGNOSIS — K625 Hemorrhage of anus and rectum: Secondary | ICD-10-CM

## 2014-11-29 DIAGNOSIS — R110 Nausea: Secondary | ICD-10-CM

## 2014-11-29 LAB — SEDIMENTATION RATE: SED RATE: 6 mm/h (ref 0–22)

## 2014-11-29 LAB — COMPREHENSIVE METABOLIC PANEL
ALK PHOS: 95 U/L (ref 39–117)
ALT: 38 U/L (ref 0–53)
AST: 26 U/L (ref 0–37)
Albumin: 4.3 g/dL (ref 3.5–5.2)
BILIRUBIN TOTAL: 1.1 mg/dL (ref 0.2–1.2)
BUN: 13 mg/dL (ref 6–23)
CO2: 30 mEq/L (ref 19–32)
Calcium: 9.1 mg/dL (ref 8.4–10.5)
Chloride: 104 mEq/L (ref 96–112)
Creatinine, Ser: 1.21 mg/dL (ref 0.40–1.50)
GFR: 64.33 mL/min (ref >60.00)
Glucose, Bld: 101 mg/dL — ABNORMAL HIGH (ref 70–99)
Potassium: 4.2 mEq/L (ref 3.5–5.1)
Sodium: 138 mEq/L (ref 135–145)
Total Protein: 6.8 g/dL (ref 6.0–8.3)

## 2014-11-29 LAB — CBC WITH DIFFERENTIAL/PLATELET
BASOS PCT: 0.2 % (ref 0.0–3.0)
Basophils Absolute: 0 10*3/uL (ref 0.0–0.1)
EOS ABS: 0.1 10*3/uL (ref 0.0–0.7)
Eosinophils Relative: 1.9 % (ref 0.0–5.0)
HCT: 48.5 % (ref 39.0–52.0)
Hemoglobin: 17.1 g/dL — ABNORMAL HIGH (ref 13.0–17.0)
LYMPHS PCT: 20.7 % (ref 12.0–46.0)
Lymphs Abs: 1.4 10*3/uL (ref 0.7–4.0)
MCHC: 35.3 g/dL (ref 30.0–36.0)
MCV: 92.6 fl (ref 78.0–100.0)
MONOS PCT: 11.2 % (ref 3.0–12.0)
Monocytes Absolute: 0.8 10*3/uL (ref 0.1–1.0)
NEUTROS PCT: 66 % (ref 43.0–77.0)
Neutro Abs: 4.6 10*3/uL (ref 1.4–7.7)
Platelets: 211 10*3/uL (ref 150.0–400.0)
RBC: 5.24 Mil/uL (ref 4.22–5.81)
RDW: 13.1 % (ref 11.5–15.5)
WBC: 6.9 10*3/uL (ref 4.0–10.5)

## 2014-11-29 MED ORDER — IOHEXOL 300 MG/ML  SOLN
100.0000 mL | Freq: Once | INTRAMUSCULAR | Status: AC | PRN
  Administered 2014-11-29: 100 mL via INTRAVENOUS

## 2014-11-29 NOTE — Patient Instructions (Signed)
Please go to the basement level to have your labs drawn.  Drink 1 bottle of contrast now at 2:45 PM.  Drink 2nd bottle of contrast at 3:30 PM   Arrive at 4:00  To [facility] [address] across from [facility].  [address].  Have nothing by mouth until after the test.  They will call Amy Esterwood PA-C with the results before they let you go home.

## 2014-11-29 NOTE — Progress Notes (Addendum)
Patient ID: CUAUHTEMOC DEBERARDINIS, male   DOB: 1951-09-05, 64 y.o.   MRN: [id number]   Subjective:    Patient ID: MARCHEL KOSMICKI, male    DOB: 20-Jul-1951, 64 y.o.   MRN: [id number]  HPI Rontae is a pleasant 64 year old white male known to Dr. Arlyce Dice. He was seen in the office on 11/19/2014 with an acute episode of diverticulitis. He had initially been seen by Dr. Jari Sportsman on February 10 2 day history of lower abdominal pain which was fairly intense. He had undergone CT of the abdomen and pelvis which confirmed a sigmoid diverticulitis and also was noted to have a prominent sigmoid diverticulum versus a small microperforation along the medial aspect of the sigmoid colon. Dr. Christell Constant gave him a seven-day course of Augmentin. He was then referred here because he was also having small-volume rectal bleeding. At the time he was seen in the office he was 90% better as far as his abdominal discomfort some small amounts of bright red blood with his bowel movements.. We treated him with an additional 10 day course of Augmentin and also added Flagyl 500 mg twice daily for 10 days to assure complete treatment of significant diverticulitis. There was question of a segmental colitis associated with a diverticulitis versus occult lesion. His last colonoscopy was done in 2006. He was therefore also scheduled for colonoscopy with Dr. Arlyce Dice which is scheduled for early April. He was asked to call should he have any problems in the interim. He comes in today stating that she still taking the antibiotics. He had gone for couple of days without antibiotics because he had gone out of town but is currently taking the Augmentin and Flagyl over the past 4 days. He says about 2 days ago he developed some right upper quadrant discomfort which bothered him all day long yesterday. He says he feels bloated and uncomfortable, his appetite has been decreased ,he has not had any nausea or vomiting. No fever or chills. He stabbing fairly normal  bowel movements without melena he still seeing intermittent small-volume bright red blood.  Review of Systems Pertinent positive and negative review of systems were noted in the above HPI section.  All other review of systems was otherwise negative.  Outpatient Encounter Prescriptions as of 11/29/2014  Medication Sig  . amLODipine (NORVASC) 10 MG tablet Take 1 tablet (10 mg total) by mouth daily.  Marland Kitchen amoxicillin-clavulanate (AUGMENTIN) 875-125 MG per tablet Take 1 tablet by mouth 2 (two) times daily.  Marland Kitchen aspirin 81 MG tablet Take 81 mg by mouth at bedtime.   Marland Kitchen BENICAR 40 MG tablet Take 1 tablet by mouth  daily  . clobetasol cream (TEMOVATE) 0.05 % Apply 1 application topically daily.   . Coenzyme Q10 (CO Q 10) 100 MG CAPS Take 1 capsule by mouth daily.  . fluticasone (CUTIVATE) 0.05 % cream Apply 1 application topically daily.   . metroNIDAZOLE (FLAGYL) 500 MG tablet Take 1 tab 2 times daily with food.  . Na Sulfate-K Sulfate-Mg Sulf SOLN Take as directed.  Marland Kitchen omeprazole (PRILOSEC) 20 MG capsule Take 1 capsule by mouth  twice daily  . SYNTHROID 175 MCG tablet Take 1 tablet (175 mcg total) by mouth daily before breakfast.   Allergies  Allergen Reactions  . Robaxin [Methocarbamol] Other (See Comments)    Seeing "bright lights"  . Sudafed [Pseudoephedrine Hcl] Other (See Comments)    Increased heart rate  . Epinephrine Palpitations   Patient Active Problem List   Diagnosis Date  Noted  . Arrhythmia 10/08/2014  . TIA (transient ischemic attack) 08/09/2014  . Lateral meniscal tear 03/12/2014  . Testosterone deficiency 07/04/2013  . Cervicogenic headache 03/28/2013  . Vitamin D deficiency 01/01/2013  . Hyperlipidemia 01/01/2013  . BPH (benign prostatic hyperplasia) 11/03/2012  . Diverticulosis large intestine w/o perforation or abscess w/bleeding 11/03/2012  . Peripheral neuropathy 11/03/2012  . Unspecified hypothyroidism 01/18/2011  . Unspecified essential hypertension 01/18/2011  . GERD  (gastroesophageal reflux disease) 01/18/2011  . Cervical spondylosis without myelopathy 11/24/2010  . Headache(784.0) 11/24/2010  . Sleep disturbance, unspecified 11/24/2010  . Syringomyelia and syringobulbia 11/24/2010  . Disturbance of skin sensation 11/24/2010  . Cerebrovascular disease, unspecified 11/24/2010   History   Social History  . Marital Status: Married    Spouse Name: N/A  . Number of Children: 2  . Years of Education: 12   Occupational History  . Retired    Social History Main Topics  . Smoking status: Never Smoker   . Smokeless tobacco: Never Used  . Alcohol Use: Yes     Comment: rare  . Drug Use: No  . Sexual Activity: Not on file   Other Topics Concern  . Not on file   Social History Narrative   Lives at home with wife.     Mr. Dumitrescu family history includes COPD in an other family member; Cancer in his sister and sister; Colon polyps in his brother and sister; Heart disease in his father; Hyperlipidemia in his sister and sister; Nephrolithiasis in his sister; Prostate cancer in his father; Stroke in his father. There is no history of Colon cancer or Esophageal cancer.      Objective:    Filed Vitals:   11/29/14 1352  BP: 136/84  Pulse: 76    Physical Exam  well-developed older white male in no acute distress, pleasant blood pressure 136/84 pulse 76 height 5 foot 10 weight 208. HEENT; nontraumatic normocephalic EOMI PERRLA sclera anicteric, Neck; supple no JVD, Cardiovascular; regular rate and rhythm with S1-S2 no murmur or gallop, Pulmonary ;clear bilaterally abdomen soft bowel sounds are present he has mild tenderness in the left lower quadrant and is also tender in the right upper quadrant right mid quadrant there is no guarding or rebound no palpable mass or hepatosplenomegaly sounds are present, Rectal; exam not done, Ext; no clubbing cyanosis or edema skin warm and dry, Psych ;mood and affect appropriate       Assessment & Plan:   #1 64  yo male with acute sigmoid diverticulitis- on prolonged course of antibiotics because of possible microperforation on initial CT. He had been improving until 2 days ago when developed new RUQ pain which is constant and associated with decrease in appetite. Need to r/o complication from diverticulitis, r/o GB disease or other intra abdominal l inflammatory process. Will check urgent CT today Cbc, CMET, sed rate, UA He will complete course of Augmentin and flagyl  Ct will be reviewed today and decide  further course of action  Addendum;  CT scan done this afternoon shows resolved diverticulitis of sigmoid colon, and an area of epiploic  appendagitis along ascending colon. i discussed with py- he will finish current course of antibiotics, use aleve or ibuprofen for pain. Offered an analgesic which he declines. Will check on him in a few days- should be fine to proceed with colonoscopy in a few weeks. He knows to call for problems, and Dr. Arlyce Dice on call this weekend  Oneka Parada Oswald Hillock PA-C 11/29/2014  Cc: Ernestina Penna, MD

## 2014-12-04 NOTE — Progress Notes (Signed)
Reviewed and agree with management. Deneise Getty D. Addylynn Balin, M.D., FACG  

## 2014-12-18 ENCOUNTER — Encounter: Payer: Self-pay | Admitting: Gastroenterology

## 2014-12-19 ENCOUNTER — Telehealth: Payer: Self-pay | Admitting: *Deleted

## 2014-12-19 NOTE — Telephone Encounter (Signed)
Fax received from Ileana Roup, PT, at [facility] regarding procedure clearance:  Reads: "Requesting approval to use Perianal Biofeedback on patient for pelvic floor muscle retraining."  If OK, sign fax & return."  Will await Dr. Rosezella Florida advice on this.

## 2014-12-21 NOTE — Telephone Encounter (Signed)
The patient has no high risk findings that would preclude him from proceeding with the planned procedure.  No further preop testing is indicated.

## 2014-12-23 ENCOUNTER — Encounter: Payer: Self-pay | Admitting: *Deleted

## 2014-12-23 NOTE — Telephone Encounter (Signed)
Clearance letter faxed to intended recipient.

## 2014-12-24 ENCOUNTER — Other Ambulatory Visit (HOSPITAL_COMMUNITY): Payer: Self-pay | Admitting: Nephrology

## 2014-12-24 DIAGNOSIS — N183 Chronic kidney disease, stage 3 unspecified: Secondary | ICD-10-CM

## 2014-12-30 ENCOUNTER — Encounter: Payer: Self-pay | Admitting: Gastroenterology

## 2014-12-30 ENCOUNTER — Ambulatory Visit (AMBULATORY_SURGERY_CENTER): Payer: 59 | Admitting: Gastroenterology

## 2014-12-30 VITALS — BP 127/73 | HR 63 | Temp 95.8°F | Resp 13 | Ht 70.75 in | Wt 208.0 lb

## 2014-12-30 DIAGNOSIS — K5733 Diverticulitis of large intestine without perforation or abscess with bleeding: Secondary | ICD-10-CM

## 2014-12-30 DIAGNOSIS — K648 Other hemorrhoids: Secondary | ICD-10-CM

## 2014-12-30 DIAGNOSIS — K5730 Diverticulosis of large intestine without perforation or abscess without bleeding: Secondary | ICD-10-CM

## 2014-12-30 LAB — GLUCOSE, CAPILLARY: GLUCOSE-CAPILLARY: 103 mg/dL — AB (ref 70–99)

## 2014-12-30 MED ORDER — SODIUM CHLORIDE 0.9 % IV SOLN: 500.0000 mL | INTRAVENOUS | Status: DC

## 2014-12-30 NOTE — Progress Notes (Signed)
Report to PACU, RN, vss, BBS= Clear.  

## 2014-12-30 NOTE — Progress Notes (Signed)
PT TO BATHROOM TO TRY AND EXPEL FLATUS . wENDY mYERS WITH PT . GIVING PT CRACKERS & JUICE

## 2014-12-30 NOTE — Progress Notes (Signed)
PT FELT BETTER AFTER EATING AND DRINKING ,TO WHEELCHAIR BY [facility] MYERS AND PT DISCHARGED.

## 2014-12-30 NOTE — Progress Notes (Signed)
BLD SUGAR  103 ,PT SAYS HE IS FEELING BETTER.

## 2014-12-30 NOTE — Patient Instructions (Addendum)
YOU HAD AN ENDOSCOPIC PROCEDURE TODAY AT [facility]:   Refer to the procedure report that was given to you for any specific questions about what was found during the examination.  If the procedure report does not answer your questions, please call your gastroenterologist to clarify.  If you requested that your care partner not be given the details of your procedure findings, then the procedure report has been included in a sealed envelope for you to review at your convenience later.  YOU SHOULD EXPECT: Some feelings of bloating in the abdomen. Passage of more gas than usual.  Walking can help get rid of the air that was put into your GI tract during the procedure and reduce the bloating. If you had a lower endoscopy (such as a colonoscopy or flexible sigmoidoscopy) you may notice spotting of blood in your stool or on the toilet paper. If you underwent a bowel prep for your procedure, you may not have a normal bowel movement for a few days.  Please Note:  You might notice some irritation and congestion in your nose or some drainage.  This is from the oxygen used during your procedure.  There is no need for concern and it should clear up in a day or so.  SYMPTOMS TO REPORT IMMEDIATELY:   Following lower endoscopy (colonoscopy or flexible sigmoidoscopy):  Excessive amounts of blood in the stool  Significant tenderness or worsening of abdominal pains  Swelling of the abdomen that is new, acute  Fever of 100F or higher    For urgent or emergent issues, a gastroenterologist can be reached at any hour by calling [phone].   DIET: Your first meal following the procedure should be a small meal and then it is ok to progress to your normal diet. Heavy or fried foods are harder to digest and may make you feel nauseous or bloated.  Likewise, meals heavy in dairy and vegetables can increase bloating.  Drink plenty of fluids but you should avoid alcoholic beverages for 24  hours.  ACTIVITY:  You should plan to take it easy for the rest of today and you should NOT DRIVE or use heavy machinery until tomorrow (because of the sedation medicines used during the test).    FOLLOW UP: Our staff will call the number listed on your records the next business day following your procedure to check on you and address any questions or concerns that you may have regarding the information given to you following your procedure. If we do not reach you, we will leave a message.  However, if you are feeling well and you are not experiencing any problems, there is no need to return our call.  We will assume that you have returned to your regular daily activities without incident.  If any biopsies were taken you will be contacted by phone or by letter within the next 1-3 weeks.  Please call us at [phone] if you have not heard about the biopsies in 3 weeks.    SIGNATURES/CONFIDENTIALITY: You and/or your care partner have signed paperwork which will be entered into your electronic medical record.  These signatures attest to the fact that that the information above on your After Visit Summary has been reviewed and is understood.  Full responsibility of the confidentiality of this discharge information lies with you and/or your care-partner.   INFORMATION ON DIVERTICULOSIS ,HEMORRHOIDS ,& HIGH FIBER DIET GIVEN TO YOU TODAY

## 2014-12-30 NOTE — Progress Notes (Signed)
DIZZY AFTER DRESSED SELF ,BACK TO STRETCHER TO SEE IF COULD EXPEL FLATUS

## 2014-12-30 NOTE — Progress Notes (Signed)
1445 PASSED FLATUS IN BR REMAINS DIZZY . DAUGHTER SAYS SOMETIMES BLD SUGAR RUNS LOW SO EATING  JUICE PEANUTBUTTER & CRACKERS IN CONSULTATION RM AND W. MYERS RN TO CHECK BLD SUGAR NOW

## 2014-12-30 NOTE — Op Note (Signed)
[place]  [address] [employer] [place], [id number]   COLONOSCOPY PROCEDURE REPORT  PATIENT: Jacob Kerr, Jacob Kerr  MR#: [id number] BIRTHDATE: 09/06/1951 , 63  yrs. old GENDER: male ENDOSCOPIST: Inda Castle, MD REFERRED WN:UUVOZD Laurance Flatten, M.D. PROCEDURE DATE:  12/30/2014 PROCEDURE:   Colonoscopy, diagnostic First Screening Colonoscopy - Avg.  risk and is 50 yrs.  old or older - No.  Prior Negative Screening - Now for repeat screening. 10 or more years since last screening  History of Adenoma - Now for follow-up colonoscopy & has been > or = to 3 yrs.  N/A ASA CLASS:   Class II INDICATIONS:Colorectal Neoplasm Risk Assessment for this procedure is average risk.   Recurrent diverticulitis MEDICATIONS: Monitored anesthesia care and Propofol 200 mg IV  DESCRIPTION OF PROCEDURE:   After the risks benefits and alternatives of the procedure were thoroughly explained, informed consent was obtained.  The digital rectal exam revealed no abnormalities of the rectum.   The [id number]  endoscope was introduced through the anus and advanced to the ileum. No adverse events experienced.   The quality of the prep was (Suprep was used) good.  The instrument was then slowly withdrawn as the colon was fully examined.      COLON FINDINGS: There was severe diverticulosis noted in the descending colon and sigmoid colon with associated muscular hypertrophy.   There was mild diverticulosis noted in the transverse colon.   Hemorrhoids were found.  Retroflexed views revealed no abnormalities. The time to cecum = 6.6 Withdrawal time = 6.5   The scope was withdrawn and the procedure completed. COMPLICATIONS: There were no immediate complications.  ENDOSCOPIC IMPRESSION: 1.   There was severe diverticulosis noted in the descending colon and sigmoid colon 2.   Mild diverticulosis was noted in the transverse colon 3.   Hemorrhoids  RECOMMENDATIONS: Colonoscopy 10 years  eSigned:   Inda Castle, MD 12/30/2014 1:55 PM   cc:

## 2014-12-31 ENCOUNTER — Telehealth: Payer: Self-pay | Admitting: *Deleted

## 2014-12-31 NOTE — Telephone Encounter (Signed)
  Follow up Call-  Call back number 12/30/2014  Post procedure Call Back phone  # [phone]  Permission to leave phone message Yes     Patient questions:  Do you have a fever, pain , or abdominal swelling? No. Pain Score  0 *  Have you tolerated food without any problems? Yes.    Have you been able to return to your normal activities? Yes.    Do you have any questions about your discharge instructions: Diet   No. Medications  No. Follow up visit  No.  Do you have questions or concerns about your Care? No.  Actions: * If pain score is 4 or above: No action needed, pain <4.

## 2015-01-01 ENCOUNTER — Ambulatory Visit (HOSPITAL_COMMUNITY)
Admission: RE | Admit: 2015-01-01 | Discharge: 2015-01-01 | Disposition: A | Discharge status: Home / Self Care | Payer: 59 | Source: Ambulatory Visit | Attending: Nephrology | Admitting: Nephrology

## 2015-01-01 DIAGNOSIS — N189 Chronic kidney disease, unspecified: Secondary | ICD-10-CM | POA: Insufficient documentation

## 2015-01-01 DIAGNOSIS — N183 Chronic kidney disease, stage 3 unspecified: Secondary | ICD-10-CM

## 2015-02-07 ENCOUNTER — Ambulatory Visit (INDEPENDENT_AMBULATORY_CARE_PROVIDER_SITE_OTHER): Payer: 59 | Admitting: Neurology

## 2015-02-07 ENCOUNTER — Encounter: Payer: Self-pay | Admitting: Neurology

## 2015-02-07 VITALS — BP 150/91 | HR 64 | Ht 72.0 in | Wt 197.2 lb

## 2015-02-07 DIAGNOSIS — G4486 Cervicogenic headache: Secondary | ICD-10-CM

## 2015-02-07 DIAGNOSIS — M47812 Spondylosis without myelopathy or radiculopathy, cervical region: Secondary | ICD-10-CM | POA: Diagnosis not present

## 2015-02-07 DIAGNOSIS — R51 Headache: Secondary | ICD-10-CM | POA: Diagnosis not present

## 2015-02-07 MED ORDER — TRAMADOL HCL 50 MG PO TABS: 50.0000 mg | ORAL_TABLET | Freq: Four times a day (QID) | ORAL | Status: DC | PRN

## 2015-02-07 MED ORDER — DULOXETINE HCL 30 MG PO CPEP: ORAL_CAPSULE | ORAL | Status: DC

## 2015-02-07 NOTE — Progress Notes (Signed)
Reason for visit: Cervicogenic headache  Jacob Kerr is an 64 y.o. male  History of present illness:  [name] is a 64 year old right-handed white male with a history of cervical spondylosis and cervical cord syrinx. The patient has had ongoing discomfort in the neck and shoulders that has not abated. The patient was on Flexeril, and he had come off this medication as it resulted in cardiac rhythm abnormalities and near syncopal events. The patient has not had any further events of palpitations since he came off the medication. In the past, epidural steroid injections of the low back resulted in significant side effects from the steroids itself. The patient has not been able to tolerate Lyrica. The patient returns to this office for further evaluation.  Past Medical History  Diagnosis Date  . Hypertension   . Hypothyroidism   . Cervicogenic headache   . History of TIA (transient ischemic attack)     04/ 2011---  no residuals  . Acute meniscal tear of left knee   . BPH (benign prostatic hypertrophy)   . History of diverticulitis   . GERD (gastroesophageal reflux disease)   . Chronic neck pain     uses TENS unit  prn  . Syrinx of spinal cord     C4 -- C7  . Allergic rhinitis   . Peripheral neuropathy   . Cervical spondylosis without myelopathy   . History of kidney stones   . H/O hiatal hernia   . Diverticulitis     Past Surgical History  Procedure Laterality Date  . Hemorrhoid surgery  1992  . Laparoscopic cholecystectomy  01-30-2003  . Right ureteroscopic laser lithotripsy stone extraction /  stent placement  05-10-2000  . Cardiac catheterization  02-18-2010   dr Angelena Form    normal coronary arteries/  normal lvsf--  ef 65%  . Transthoracic echocardiogram  01-16-2010    normal lvf/  ef  60%/  mild lae  . Negative sleep study  2013    per pt  . Removal forgein body from ear  AGE 21  . Knee arthroscopy Left 03/13/2014    Procedure: LEFT ARTHROSCOPY KNEE WITH  DEBRIDMENT;  Surgeon: Gearlean Alf, MD;  [facility]  Service: Orthopedics;  Laterality: Left;  . Colonoscopy      Family History  Problem Relation Age of Onset  . Prostate cancer Father   . Heart disease Father   . Stroke Father   . Nephrolithiasis Sister   . Cancer Sister     Intestinal  . Cancer Sister     Sinus  . COPD    . Hyperlipidemia Sister   . Hyperlipidemia Sister   . Colon cancer Neg Hx   . Colon polyps Brother     x2  . Colon polyps Sister   . Esophageal cancer Neg Hx     Social history:  reports that he has never smoked. He has never used smokeless tobacco. He reports that he drinks alcohol. He reports that he does not use illicit drugs.    Allergies  Allergen Reactions  . Robaxin [Methocarbamol] Other (See Comments)    Seeing "bright lights"  . Sudafed [Pseudoephedrine Hcl] Other (See Comments)    Increased heart rate  . Epinephrine Palpitations    Medications:  Prior to Admission medications   Medication Sig Start Date End Date Taking? Authorizing Provider  amLODipine (NORVASC) 10 MG tablet Take 1 tablet (10 mg total) by mouth daily. Patient taking differently:  Take 5 mg by mouth daily.  11/06/14  Yes Minus Breeding, MD  aspirin 81 MG tablet Take 81 mg by mouth at bedtime.    Yes Historical Provider, MD  BENICAR 40 MG tablet Take 1 tablet by mouth  daily 08/01/14  Yes Chipper Herb, MD  clobetasol cream (TEMOVATE) 0.96 % Apply 1 application topically daily.  07/23/14  Yes Historical Provider, MD  Coenzyme Q10 (CO Q 10) 100 MG CAPS Take 1 capsule by mouth daily.   Yes Historical Provider, MD  fluticasone (CUTIVATE) 0.05 % cream Apply 1 application topically daily.  07/24/14  Yes Historical Provider, MD  metroNIDAZOLE (FLAGYL) 500 MG tablet Take 1 tab 2 times daily with food. 11/19/14  Yes Amy S Esterwood, PA-C  Na Sulfate-K Sulfate-Mg Sulf SOLN Take as directed. 11/19/14  Yes Amy S Esterwood, PA-C  omeprazole (PRILOSEC) 20 MG  capsule Take 1 capsule by mouth  twice daily 04/20/14  Yes Chipper Herb, MD  SYNTHROID 175 MCG tablet Take 1 tablet (175 mcg total) by mouth daily before breakfast. 09/24/14  Yes Chipper Herb, MD    ROS:  Out of a complete 14 system review of symptoms, the patient complains only of the following symptoms, and all other reviewed systems are negative.  Ringing in the ears Abdominal pain, constipation Difficulty urinating, testicular pain Joint swelling, back pain, neck pain Dizziness, headache  Blood pressure 150/91, pulse 64, height 6' (1.829 m), weight 197 lb 3.2 oz (89.449 kg).  Physical Exam  General: The patient is alert and cooperative at the time of the examination.  Neuromuscular: The patient lacks about 20 of full lateral rotation of the cervical spine bilaterally.  Skin: No significant peripheral edema is noted.   Neurologic Exam  Mental status: The patient is alert and oriented x 3 at the time of the examination. The patient has apparent normal recent and remote memory, with an apparently normal attention span and concentration ability.   Cranial nerves: Facial symmetry is present. Speech is normal, no aphasia or dysarthria is noted. Extraocular movements are full. Visual fields are full.  Motor: The patient has good strength in all 4 extremities.  Sensory examination: Soft touch sensation is symmetric on the face, arms, and legs.  Coordination: The patient has good finger-nose-finger and heel-to-shin bilaterally.  Gait and station: The patient has a normal gait. Tandem gait is normal. Romberg is negative. No drift is seen.  Reflexes: Deep tendon reflexes are symmetric.   MRI brain 08/24/2014:  IMPRESSION: Slightly abnormal MRI scan of the brain showing tiny subcortical nonspecific white matter hyperintensities bilaterally with differential discussed above. No enhancing lesions are noted. Overall no significant change compared with prior MRI brain dated  12/11/2010   MRA head 08/24/2014:  IMPRESSION: Unremarkable MRI of the brain showing no significant stenosis of the large and medium-sized intracranial vessels.   Assessment/Plan:  1. Cervical spondylosis, cervical cord syrinx  2. Cervicogenic headache  The patient is still having significant ongoing neuromuscular discomfort from the neck and shoulders. The patient will be placed on Cymbalta taking 30 mg daily for 2 weeks, then go to 30 g twice daily. The patient will be given Ultram to take if needed for the discomfort. He will follow-up in 6 months. The patient will contact me if he is not doing well.  Jill Alexanders MD 02/08/2015 6:00 PM  [facility] [address] [place], [place] [address]  Phone [phone] Fax [phone]

## 2015-02-11 ENCOUNTER — Telehealth: Payer: Self-pay | Admitting: Neurology

## 2015-02-11 NOTE — Telephone Encounter (Signed)
I called the ins.  Spoke with [place].  She said the medication is covered, however it is rejecting Refill Too Soon, says they will pay for meds again tomorrow if pharmacy reprocesses the claim.  I called the pharmacy back.  Spoke with the pharmacist.  Relayed info from ins.  He verbalized understanding and will call back if anything further is needed.

## 2015-02-11 NOTE — Telephone Encounter (Addendum)
Brook with [employer] about Rx Cymbalta 30 mg. She is calling because the patient's orders are to take 1 tablet/day for first 2 weeks then increase to 2 tablets/day thereafter. The insurance will not pay for 2 tablets per day.  Rx is written for 30 mg tablets so can [facility] the Rx after first 2 weeks for 60 mg tablets to accomodate.  Please call Brook [phone].

## 2015-02-12 ENCOUNTER — Telehealth: Payer: Self-pay | Admitting: Neurology

## 2015-02-12 NOTE — Telephone Encounter (Signed)
ERROR

## 2015-03-19 DIAGNOSIS — Z0289 Encounter for other administrative examinations: Secondary | ICD-10-CM

## 2015-03-21 ENCOUNTER — Other Ambulatory Visit: Payer: Self-pay

## 2015-03-21 MED ORDER — SYNTHROID 175 MCG PO TABS: 175.0000 ug | ORAL_TABLET | Freq: Every day | ORAL | Status: DC

## 2015-05-21 ENCOUNTER — Encounter: Payer: Self-pay | Admitting: Family Medicine

## 2015-05-21 ENCOUNTER — Ambulatory Visit (INDEPENDENT_AMBULATORY_CARE_PROVIDER_SITE_OTHER): Payer: 59

## 2015-05-21 ENCOUNTER — Ambulatory Visit (INDEPENDENT_AMBULATORY_CARE_PROVIDER_SITE_OTHER): Payer: 59 | Admitting: Family Medicine

## 2015-05-21 VITALS — BP 145/87 | HR 66 | Temp 97.5°F | Ht 72.0 in | Wt 194.0 lb

## 2015-05-21 DIAGNOSIS — E038 Other specified hypothyroidism: Secondary | ICD-10-CM | POA: Diagnosis not present

## 2015-05-21 DIAGNOSIS — N400 Enlarged prostate without lower urinary tract symptoms: Secondary | ICD-10-CM

## 2015-05-21 DIAGNOSIS — M542 Cervicalgia: Secondary | ICD-10-CM | POA: Diagnosis not present

## 2015-05-21 DIAGNOSIS — M546 Pain in thoracic spine: Secondary | ICD-10-CM | POA: Diagnosis not present

## 2015-05-21 DIAGNOSIS — I10 Essential (primary) hypertension: Secondary | ICD-10-CM

## 2015-05-21 DIAGNOSIS — E034 Atrophy of thyroid (acquired): Secondary | ICD-10-CM | POA: Diagnosis not present

## 2015-05-21 DIAGNOSIS — E349 Endocrine disorder, unspecified: Secondary | ICD-10-CM

## 2015-05-21 DIAGNOSIS — M549 Dorsalgia, unspecified: Secondary | ICD-10-CM

## 2015-05-21 DIAGNOSIS — Z0000 Encounter for general adult medical examination without abnormal findings: Secondary | ICD-10-CM | POA: Diagnosis not present

## 2015-05-21 DIAGNOSIS — E785 Hyperlipidemia, unspecified: Secondary | ICD-10-CM | POA: Diagnosis not present

## 2015-05-21 DIAGNOSIS — E291 Testicular hypofunction: Secondary | ICD-10-CM | POA: Diagnosis not present

## 2015-05-21 DIAGNOSIS — K219 Gastro-esophageal reflux disease without esophagitis: Secondary | ICD-10-CM | POA: Diagnosis not present

## 2015-05-21 DIAGNOSIS — G8929 Other chronic pain: Secondary | ICD-10-CM

## 2015-05-21 LAB — POCT UA - MICROSCOPIC ONLY
Bacteria, U Microscopic: NEGATIVE
CRYSTALS, UR, HPF, POC: NEGATIVE
Casts, Ur, LPF, POC: NEGATIVE
Mucus, UA: NEGATIVE
RBC, urine, microscopic: NEGATIVE
WBC, Ur, HPF, POC: NEGATIVE
Yeast, UA: NEGATIVE

## 2015-05-21 LAB — POCT URINALYSIS DIPSTICK
Bilirubin, UA: NEGATIVE
Blood, UA: NEGATIVE
Glucose, UA: NEGATIVE
Ketones, UA: NEGATIVE
Leukocytes, UA: NEGATIVE
Nitrite, UA: NEGATIVE
Protein, UA: NEGATIVE
SPEC GRAV UA: 1.01
UROBILINOGEN UA: NEGATIVE
pH, UA: 7

## 2015-05-21 MED ORDER — TRAMADOL HCL 50 MG PO TABS: 50.0000 mg | ORAL_TABLET | Freq: Four times a day (QID) | ORAL | Status: DC | PRN

## 2015-05-21 NOTE — Addendum Note (Signed)
Addended by: Zannie Cove on: 05/21/2015 05:22 PM   Modules accepted: Orders

## 2015-05-21 NOTE — Patient Instructions (Addendum)
Continue current medications. Continue good therapeutic lifestyle changes which include good diet and exercise. Fall precautions discussed with patient. If an FOBT was given today- please return it to our front desk. If you are over 64 years old - you may need Prevnar 43 or the adult Pneumonia vaccine.  **Flu shots will be available soon--- please call and schedule a FLU-CLINIC appointment**  After your visit with [place] today you will receive a survey in the mail or online from [employer] regarding your care with [place]. Please take a moment to fill this out. Your feedback is very important to [place] as you can help [place] better understand your patient needs as well as improve your experience and satisfaction. WE CARE ABOUT YOU!!!   **Please join [place] SEPT.22, 2016 from 5:00 to 7:00pm for our OPEN HOUSE! Come out and meet our NEW providers**   continue to follow-up with neurology as needed  Continue to watch sodium intake  Continue current medications  Drink plenty of water

## 2015-05-21 NOTE — Progress Notes (Signed)
 Subjective:    Patient ID: Jacob Kerr, male    DOB: 09/08/1951, 63 y.o.   MRN: [id number]  HPI Patient is here today for annual wellness exam and follow up of chronic medical problems which includes hypertension and hyperlipidemia. He is taking medications regularly. The biggest problem today for this patient is his back spasms. He has some chronic neck and back pain with cervical spondylosis and syringomyelia. He also has hypertension. He does have a history of a TIA. He is requesting a refill today on his tramadol. He will return to the office nonfasting for lab work will be given an FOBT also to return. We will get a chest x-ray today. He is going to check with his insurance regarding the Prevnar vaccine. The patient denies chest pain or shortness of breath. He has occasional diarrhea he associates this with his neck and back pain. He denies any blood in the stool except when he has bouts with his diverticulitis. He is passing his water without problems. He does have headaches secondary to the cervical syrinx that he has. He continues to see the neurologist periodically. Family history is positive for diabetes prostate cancer abdominal cancer but not intestinal cancer and heart disease.      Patient Active Problem List   Diagnosis Date Noted  . Arrhythmia 10/08/2014  . TIA (transient ischemic attack) 08/09/2014  . Lateral meniscal tear 03/12/2014  . Testosterone deficiency 07/04/2013  . Cervicogenic headache 03/28/2013  . Vitamin D deficiency 01/01/2013  . Hyperlipidemia 01/01/2013  . BPH (benign prostatic hyperplasia) 11/03/2012  . Diverticulosis large intestine w/o perforation or abscess w/bleeding 11/03/2012  . Peripheral neuropathy 11/03/2012  . Unspecified hypothyroidism 01/18/2011  . Unspecified essential hypertension 01/18/2011  . GERD (gastroesophageal reflux disease) 01/18/2011  . Cervical spondylosis without myelopathy 11/24/2010  . Headache(784.0) 11/24/2010  . Sleep  disturbance, unspecified 11/24/2010  . Syringomyelia and syringobulbia 11/24/2010  . Disturbance of skin sensation 11/24/2010  . Cerebrovascular disease, unspecified 11/24/2010   Outpatient Encounter Prescriptions as of 05/21/2015  Medication Sig  . amLODipine (NORVASC) 10 MG tablet Take 1 tablet (10 mg total) by mouth daily. (Patient taking differently: Take 5 mg by mouth daily. )  . aspirin 81 MG tablet Take 81 mg by mouth at bedtime.   . BENICAR 40 MG tablet Take 1 tablet by mouth  daily  . clobetasol cream (TEMOVATE) 0.05 % Apply 1 application topically daily.   . Coenzyme Q10 (CO Q 10) 100 MG CAPS Take 1 capsule by mouth daily.  . fluticasone (CUTIVATE) 0.05 % cream Apply 1 application topically daily.   . Na Sulfate-K Sulfate-Mg Sulf SOLN Take as directed.  . nystatin (MYCOSTATIN/NYSTOP) 100000 UNIT/GM POWD   . omeprazole (PRILOSEC) 20 MG capsule Take 1 capsule by mouth  twice daily  . SYNTHROID 175 MCG tablet Take 1 tablet (175 mcg total) by mouth daily before breakfast.  . traMADol (ULTRAM) 50 MG tablet Take 1 tablet (50 mg total) by mouth every 6 (six) hours as needed.  . [DISCONTINUED] DULoxetine (CYMBALTA) 30 MG capsule One tablet daily for 2 weeks, then take one tablet twice a day  . [DISCONTINUED] metroNIDAZOLE (FLAGYL) 500 MG tablet Take 1 tab 2 times daily with food.   No facility-administered encounter medications on file as of 05/21/2015.     Review of Systems  Constitutional: Negative.   HENT: Negative.   Eyes: Negative.   Respiratory: Negative.   Cardiovascular: Negative.   Gastrointestinal: Negative.   Endocrine: Negative.     Genitourinary: Negative.   Musculoskeletal: Positive for back pain (spasms).  Skin: Negative.   Allergic/Immunologic: Negative.   Neurological: Negative.   Hematological: Negative.   Psychiatric/Behavioral: Negative.        Objective:   Physical Exam  Constitutional: He is oriented to person, place, and time. He appears well-developed  and well-nourished. No distress.  The patient is alert and pleasant and was obviously somewhat uncomfortable sitting on the table because of his chronic neck and back pain  HENT:  Head: Normocephalic and atraumatic.  Right Ear: External ear normal.  Left Ear: External ear normal.  Nose: Nose normal.  Mouth/Throat: Oropharynx is clear and moist. No oropharyngeal exudate.  Nasal congestion bilaterally  Eyes: Conjunctivae and EOM are normal. Pupils are equal, round, and reactive to light. Right eye exhibits no discharge. Left eye exhibits no discharge. No scleral icterus.  Neck: Normal range of motion. Neck supple. No thyromegaly present.  Slightly limited range of motion from side to side  Cardiovascular: Normal rate, regular rhythm, normal heart sounds and intact distal pulses.   No murmur heard. Heart has a regular rate and rhythm at 72/m  Pulmonary/Chest: Effort normal and breath sounds normal. No respiratory distress. He has no wheezes. He has no rales. He exhibits no tenderness.  Clear anteriorly and posteriorly  Abdominal: Soft. Bowel sounds are normal. He exhibits no mass. There is no tenderness. There is no rebound and no guarding.  Slight epigastric tenderness without organ enlargement masses or bruits  Genitourinary: Rectum normal and penis normal.  The prostate was slightly enlarged without lumps or masses. The rectum was negative for masses. There is no inguinal adenopathy. There were no inguinal hernias bilaterally. And the testicles were normal.  Musculoskeletal: Normal range of motion. He exhibits no edema or tenderness.  The patient has somewhat cautious movements with his upper torso and neck.  Lymphadenopathy:    He has no cervical adenopathy.  Neurological: He is alert and oriented to person, place, and time. He has normal reflexes. No cranial nerve deficit.  Skin: Skin is warm and dry. Rash noted. No erythema. No pallor.  The patient has tinea cruris which is improving  from using nystatin powder. He also has vitiligo and the groin area and the bottom of his feet.  Psychiatric: He has a normal mood and affect. His behavior is normal. Judgment and thought content normal.  Nursing note and vitals reviewed.  BP 145/87 mmHg  Pulse 66  Temp(Src) 97.5 F (36.4 C) (Oral)  Ht 6' (1.829 m)  Wt 194 lb (87.998 kg)  BMI 26.31 kg/m2  [facility] reading (PRIMARY) by  Dr.Moore-chest x-ray--   degenerative changes thoracic spine                                     Assessment & Plan:  1. Annual physical exam -The patient will check with his insurance regarding the Prevnar vaccine - BMP8+EGFR; Future - CBC with Differential/Platelet; Future - Hepatic function panel; Future - NMR, lipoprofile; Future - Vit D  25 hydroxy (rtn osteoporosis monitoring); Future - Thyroid Panel With TSH; Future - PSA; Future - POCT UA - Microscopic Only; Future - POCT urinalysis dipstick; Future - DG Chest 2 View; Future  2. Hyperlipidemia -He will continue with as aggressive therapeutic lifestyle changes as possible which include diet and exercise - CBC with Differential/Platelet; Future - NMR, lipoprofile; Future - DG Chest 2 View;  Future  3. Testosterone deficiency -He has no specific complaints regarding the testosterone deficiency today. - CBC with Differential/Platelet; Future  4. Essential hypertension, benign -His blood pressure is slightly elevated today but we will make no change in treatment as it has been stable over a period of time. -Continue with Benicar and amlodipine and monitor blood pressures at home  - BMP8+EGFR; Future - CBC with Differential/Platelet; Future - Hepatic function panel; Future - DG Chest 2 View; Future  5. BPH (benign prostatic hyperplasia) -The prostate is minimally enlarged and no symptoms have been relegated to this. - CBC with Differential/Platelet; Future - PSA; Future - POCT UA - Microscopic Only; Future - POCT urinalysis dipstick;  Future  6. Gastroesophageal reflux disease, esophagitis presence not specified -He will continue with his omeprazole - CBC with Differential/Platelet; Future - Hepatic function panel; Future  7. Chronic neck pain -He'll continue to follow up as needed with the neurologist and will given a prescription for tramadol to take as needed for neck pain  8. Chronic high back pain -Continue with pain medication as needed and obviously avoid irritating physical activity  9. Hypothyroidism -Continue with current treatment pending results of lab work  Meds ordered this encounter  Medications  . nystatin (MYCOSTATIN/NYSTOP) 100000 UNIT/GM POWD    Sig:   . traMADol (ULTRAM) 50 MG tablet    Sig: Take 1 tablet (50 mg total) by mouth every 6 (six) hours as needed.    Dispense:  40 tablet    Refill:  1   Patient Instructions  Continue current medications. Continue good therapeutic lifestyle changes which include good diet and exercise. Fall precautions discussed with patient. If an FOBT was given today- please return it to our front desk. If you are over 25 years old - you may need Prevnar 6 or the adult Pneumonia vaccine.  **Flu shots will be available soon--- please call and schedule a FLU-CLINIC appointment**  After your visit with [place] today you will receive a survey in the mail or online from [employer] regarding your care with [place]. Please take a moment to fill this out. Your feedback is very important to [place] as you can help [place] better understand your patient needs as well as improve your experience and satisfaction. WE CARE ABOUT YOU!!!   **Please join [place] SEPT.[email or web address], 2016 from 5:00 to 7:00pm for our OPEN HOUSE! Come out and meet our NEW providers**   continue to follow-up with neurology as needed  Continue to watch sodium intake  Continue current medications  Drink plenty of water   Arrie Senate MD

## 2015-05-22 ENCOUNTER — Other Ambulatory Visit: Payer: 59

## 2015-05-22 NOTE — Addendum Note (Signed)
Addended by: Earlene Plater on: 05/22/2015 08:49 AM   Modules accepted: Orders

## 2015-05-22 NOTE — Progress Notes (Signed)
Lab only 

## 2015-05-22 NOTE — Addendum Note (Signed)
Addended by: Earlene Plater on: 05/22/2015 08:47 AM   Modules accepted: Orders

## 2015-05-23 ENCOUNTER — Other Ambulatory Visit: Payer: Self-pay | Admitting: *Deleted

## 2015-05-23 LAB — THYROID PANEL WITH TSH
FREE THYROXINE INDEX: 3.3 (ref 1.2–4.9)
T3 UPTAKE RATIO: 27 % (ref 24–39)
T4 TOTAL: 12.1 ug/dL — AB (ref 4.5–12.0)
TSH: 0.284 u[IU]/mL — AB (ref 0.450–4.500)

## 2015-05-23 LAB — CBC WITH DIFFERENTIAL/PLATELET
BASOS ABS: 0 10*3/uL (ref 0.0–0.2)
Basos: 1 %
EOS (ABSOLUTE): 0.1 10*3/uL (ref 0.0–0.4)
EOS: 3 %
Hematocrit: 51.1 % — ABNORMAL HIGH (ref 37.5–51.0)
Hemoglobin: 17.5 g/dL (ref 12.6–17.7)
IMMATURE GRANULOCYTES: 0 %
Immature Grans (Abs): 0 10*3/uL (ref 0.0–0.1)
LYMPHS ABS: 1.5 10*3/uL (ref 0.7–3.1)
Lymphs: 26 %
MCH: 32.5 pg (ref 26.6–33.0)
MCHC: 34.2 g/dL (ref 31.5–35.7)
MCV: 95 fL (ref 79–97)
MONOS ABS: 0.6 10*3/uL (ref 0.1–0.9)
Monocytes: 10 %
NEUTROS PCT: 60 %
Neutrophils Absolute: 3.5 10*3/uL (ref 1.4–7.0)
PLATELETS: 224 10*3/uL (ref 150–379)
RBC: 5.38 x10E6/uL (ref 4.14–5.80)
RDW: 13.9 % (ref 12.3–15.4)
WBC: 5.7 10*3/uL (ref 3.4–10.8)

## 2015-05-23 LAB — BMP8+EGFR
BUN/Creatinine Ratio: 10 (ref 10–22)
BUN: 13 mg/dL (ref 8–27)
CO2: 25 mmol/L (ref 18–29)
Calcium: 9.3 mg/dL (ref 8.6–10.2)
Chloride: 99 mmol/L (ref 97–108)
Creatinine, Ser: 1.29 mg/dL — ABNORMAL HIGH (ref 0.76–1.27)
GFR calc Af Amer: 68 mL/min/{1.73_m2} (ref >59)
GFR, EST NON AFRICAN AMERICAN: 59 mL/min/{1.73_m2} — AB (ref >59)
GLUCOSE: 117 mg/dL — AB (ref 65–99)
Potassium: 4.2 mmol/L (ref 3.5–5.2)
SODIUM: 138 mmol/L (ref 134–144)

## 2015-05-23 LAB — HEPATIC FUNCTION PANEL
ALBUMIN: 4.3 g/dL (ref 3.6–4.8)
ALT: 24 IU/L (ref 0–44)
AST: 17 IU/L (ref 0–40)
Alkaline Phosphatase: 102 IU/L (ref 39–117)
Bilirubin Total: 1.9 mg/dL — ABNORMAL HIGH (ref 0.0–1.2)
Bilirubin, Direct: 0.37 mg/dL (ref 0.00–0.40)
Total Protein: 6.5 g/dL (ref 6.0–8.5)

## 2015-05-23 LAB — PSA: Prostate Specific Ag, Serum: 0.2 ng/mL (ref 0.0–4.0)

## 2015-05-23 LAB — TESTOSTERONE,FREE AND TOTAL
Testosterone, Free: 12.6 pg/mL (ref 6.6–18.1)
Testosterone: 587 ng/dL (ref 348–1197)

## 2015-05-23 LAB — VITAMIN D 25 HYDROXY (VIT D DEFICIENCY, FRACTURES): VIT D 25 HYDROXY: 35.8 ng/mL (ref 30.0–100.0)

## 2015-05-23 LAB — NMR, LIPOPROFILE
Cholesterol: 167 mg/dL (ref 100–199)
HDL CHOLESTEROL BY NMR: 36 mg/dL — AB (ref >39)
HDL PARTICLE NUMBER: 29.3 umol/L — AB (ref >=30.5)
LDL PARTICLE NUMBER: 1353 nmol/L — AB (ref <1000)
LDL Size: 20.2 nm (ref >20.5)
LDL-C: 99 mg/dL (ref 0–99)
LP-IR Score: 71 — ABNORMAL HIGH (ref <=45)
Small LDL Particle Number: 909 nmol/L — ABNORMAL HIGH (ref <=527)
Triglycerides by NMR: 162 mg/dL — ABNORMAL HIGH (ref 0–149)

## 2015-05-23 MED ORDER — LEVOTHYROXINE SODIUM 150 MCG PO TABS: ORAL_TABLET | ORAL | Status: DC

## 2015-05-27 ENCOUNTER — Other Ambulatory Visit: Payer: Self-pay | Admitting: *Deleted

## 2015-05-27 MED ORDER — LEVOTHYROXINE SODIUM 150 MCG PO TABS: ORAL_TABLET | ORAL | Status: DC

## 2015-06-22 ENCOUNTER — Other Ambulatory Visit: Payer: Self-pay | Admitting: Family Medicine

## 2015-07-04 ENCOUNTER — Other Ambulatory Visit: Payer: Self-pay | Admitting: Family Medicine

## 2015-07-31 ENCOUNTER — Ambulatory Visit (INDEPENDENT_AMBULATORY_CARE_PROVIDER_SITE_OTHER): Payer: 59

## 2015-07-31 DIAGNOSIS — Z23 Encounter for immunization: Secondary | ICD-10-CM

## 2015-08-11 ENCOUNTER — Encounter: Payer: Self-pay | Admitting: Neurology

## 2015-08-11 ENCOUNTER — Ambulatory Visit (INDEPENDENT_AMBULATORY_CARE_PROVIDER_SITE_OTHER): Payer: 59 | Admitting: Neurology

## 2015-08-11 VITALS — BP 143/86 | HR 66 | Ht 72.0 in | Wt 197.5 lb

## 2015-08-11 DIAGNOSIS — G6289 Other specified polyneuropathies: Secondary | ICD-10-CM | POA: Diagnosis not present

## 2015-08-11 DIAGNOSIS — R51 Headache: Secondary | ICD-10-CM

## 2015-08-11 DIAGNOSIS — M47812 Spondylosis without myelopathy or radiculopathy, cervical region: Secondary | ICD-10-CM | POA: Diagnosis not present

## 2015-08-11 DIAGNOSIS — G4486 Cervicogenic headache: Secondary | ICD-10-CM

## 2015-08-11 MED ORDER — TIZANIDINE HCL 2 MG PO TABS: 2.0000 mg | ORAL_TABLET | Freq: Two times a day (BID) | ORAL | Status: DC

## 2015-08-11 NOTE — Progress Notes (Signed)
Reason for visit: Cervical spondylosis  Jacob Kerr is an 64 y.o. male  History of present illness:  Jacob Kerr is a 64 year old right-handed white male with a history of cervical spondylosis and a cervical cord syrinx. The patient has had some tingling sensations and shock sensations down to the shoulder blades at times, he reports no weakness of the extremities. He has chronic neuromuscular discomfort, he has to move on a regular basis in order to reduce the pain. He has been sensitive to several medications including Flexeril, Cymbalta, amitriptyline, and Lyrica without good tolerance. The patient is on Ultram on a regular basis, taking at least 2 a day. The patient is stretching with yoga, he gets massages on regular basis. This does help transiently. He has not had any further events of atrial fibrillation. He returns for an evaluation.  Past Medical History  Diagnosis Date  . Hypertension   . Hypothyroidism   . Cervicogenic headache   . History of TIA (transient ischemic attack)     04/ 2011---  no residuals  . Acute meniscal tear of left knee   . BPH (benign prostatic hypertrophy)   . History of diverticulitis   . GERD (gastroesophageal reflux disease)   . Chronic neck pain     uses TENS unit  prn  . Syrinx of spinal cord (HCC)     C4 -- C7  . Allergic rhinitis   . Peripheral neuropathy ([place])   . Cervical spondylosis without myelopathy   . History of kidney stones   . H/O hiatal hernia   . Diverticulitis     Past Surgical History  Procedure Laterality Date  . Hemorrhoid surgery  1992  . Laparoscopic cholecystectomy  01-30-2003  . Right ureteroscopic laser lithotripsy stone extraction /  stent placement  05-10-2000  . Cardiac catheterization  02-18-2010   dr Angelena Form    normal coronary arteries/  normal lvsf--  ef 65%  . Transthoracic echocardiogram  01-16-2010    normal lvf/  ef  60%/  mild lae  . Negative sleep study  2013    per pt  . Removal forgein body  from ear  AGE 75  . Knee arthroscopy Left 03/13/2014    Procedure: LEFT ARTHROSCOPY KNEE WITH DEBRIDMENT;  Surgeon: Gearlean Alf, MD;  Location: [facility];  Service: Orthopedics;  Laterality: Left;  . Colonoscopy      Family History  Problem Relation Age of Onset  . Prostate cancer Father   . Heart disease Father   . Stroke Father   . Nephrolithiasis Sister   . Cancer Sister     Intestinal  . Cancer Sister     Sinus  . COPD    . Hyperlipidemia Sister   . Hyperlipidemia Sister   . Colon cancer Neg Hx   . Colon polyps Brother     x2  . Colon polyps Sister   . Esophageal cancer Neg Hx     Social history:  reports that he has never smoked. He has never used smokeless tobacco. He reports that he drinks alcohol. He reports that he does not use illicit drugs.    Allergies  Allergen Reactions  . Robaxin [Methocarbamol] Other (See Comments)    Seeing "bright lights"  . Sudafed [Pseudoephedrine Hcl] Other (See Comments)    Increased heart rate  . Epinephrine Palpitations    Medications:  Prior to Admission medications   Medication Sig Start Date End Date Taking? Authorizing Provider  amLODipine (NORVASC) 10 MG tablet Take 1 tablet (10 mg total) by mouth daily. Patient taking differently: Take 5 mg by mouth daily.  11/06/14  Yes Minus Breeding, MD  aspirin 81 MG tablet Take 81 mg by mouth at bedtime.    Yes Historical Provider, MD  BENICAR 40 MG tablet Take 1 tablet by mouth  daily 07/04/15  Yes Chipper Herb, MD  clobetasol cream (TEMOVATE) [id number] % Apply 1 application topically daily.  07/23/14  Yes Historical Provider, MD  Coenzyme Q10 (CO Q 10) 100 MG CAPS Take 1 capsule by mouth daily.   Yes Historical Provider, MD  fluticasone (CUTIVATE) 0.05 % cream Apply 1 application topically daily.  07/24/14  Yes Historical Provider, MD  ketoconazole (NIZORAL) 2 % cream APPLY TO AFFECTED AREA TO GROIN UP TO TWICE DAILY AS NEEDED 05/29/15  Yes Historical Provider, MD    nystatin (MYCOSTATIN/NYSTOP) 100000 UNIT/GM POWD  05/19/15  Yes Historical Provider, MD  omeprazole (PRILOSEC) 20 MG capsule Take 1 capsule by mouth  twice daily 06/23/15  Yes Mary-Margaret Hassell Done, FNP  SYNTHROID 175 MCG tablet Take 1 tablet (175 mcg total) by mouth daily before breakfast. 03/21/15  Yes Chipper Herb, MD  traMADol (ULTRAM) 50 MG tablet Take 1 tablet (50 mg total) by mouth every 6 (six) hours as needed. 05/21/15  Yes Chipper Herb, MD    ROS:  Out of a complete 14 system review of symptoms, the patient complains only of the following symptoms, and all other reviewed systems are negative.  Ringing in the ears Muscle discomfort Dizziness Urinary incontinence  Blood pressure 143/86, pulse 66, height 6' (1.829 m), weight 197 lb 8 oz (89.585 kg).  Physical Exam  General: The patient is alert and cooperative at the time of the examination.  Skin: No significant peripheral edema is noted.   Neurologic Exam  Mental status: The patient is alert and oriented x 3 at the time of the examination. The patient has apparent normal recent and remote memory, with an apparently normal attention span and concentration ability.   Cranial nerves: Facial symmetry is present. Speech is normal, no aphasia or dysarthria is noted. Extraocular movements are full. Visual fields are full.  Motor: The patient has good strength in all 4 extremities.  Sensory examination: Soft touch sensation is symmetric on the face, arms, and legs.  Coordination: The patient has good finger-nose-finger and heel-to-shin bilaterally.  Gait and station: The patient has a normal gait. Tandem gait is normal. Romberg is unsteady, the patient tends to go backwards. No drift is seen.  Reflexes: Deep tendon reflexes are symmetric.   Assessment/Plan:  1. Cervical spondylosis  2. Cervical cord syrinx  3. Chronic neuromuscular discomfort  The patient will be given a brief trial on tizanidine, if he tolerates  this, he will contact our office. He will follow-up in one year, sooner if needed. He will continue the neuromuscular therapies.  Jill Alexanders MD 08/11/2015 7:48 PM  [facility] [address] [place], [place] [address]  Phone [phone] Fax [phone]

## 2015-08-22 ENCOUNTER — Other Ambulatory Visit: Payer: Self-pay | Admitting: Family Medicine

## 2015-09-02 ENCOUNTER — Other Ambulatory Visit: Payer: Self-pay | Admitting: Cardiology

## 2015-11-27 ENCOUNTER — Other Ambulatory Visit: Payer: Self-pay | Admitting: Nurse Practitioner

## 2015-12-01 ENCOUNTER — Other Ambulatory Visit: Payer: Self-pay

## 2015-12-01 MED ORDER — LOSARTAN POTASSIUM 100 MG PO TABS: 100.0000 mg | ORAL_TABLET | Freq: Every day | ORAL | Status: DC

## 2015-12-01 NOTE — Telephone Encounter (Signed)
Change to losartan 100 mg and discontinue Benicar 40. Monitor blood pressures regularly

## 2015-12-01 NOTE — Telephone Encounter (Signed)
Benicar is now $600 for 3 month supply of generic. Namebrand is $700. Can we try Losartan 100mg ? 30 day supply to [facility].

## 2015-12-02 ENCOUNTER — Other Ambulatory Visit: Payer: Self-pay | Admitting: *Deleted

## 2015-12-02 NOTE — Telephone Encounter (Signed)
Patient aware -benacar to expensive - change to losartan per Dr. Laurance Flatten Rx sent to pharmacy

## 2015-12-16 ENCOUNTER — Telehealth: Payer: Self-pay | Admitting: Gastroenterology

## 2015-12-16 NOTE — Telephone Encounter (Signed)
OK, lets see how the visit with PCP goes

## 2015-12-16 NOTE — Telephone Encounter (Signed)
Doc of Day Patient has complaints of abdominal discomfort for the past 3 weeks. It is worsening. He feels sore in the left lower quadrant. He was experiencing loose bowels (not watery diarrhea), but today he "had to sit a while". He says it feels different from the last time. He made an appointment with his primary care for tomorrow.  No recent ATB's Afebrile No nausea

## 2015-12-16 NOTE — Telephone Encounter (Signed)
Patient agrees with this plan.  

## 2015-12-17 ENCOUNTER — Encounter: Payer: Self-pay | Admitting: Family Medicine

## 2015-12-17 ENCOUNTER — Other Ambulatory Visit: Payer: Self-pay | Admitting: *Deleted

## 2015-12-17 ENCOUNTER — Ambulatory Visit (HOSPITAL_COMMUNITY)
Admission: RE | Admit: 2015-12-17 | Discharge: 2015-12-17 | Disposition: A | Discharge status: Home / Self Care | Payer: 59 | Source: Ambulatory Visit | Attending: Family Medicine | Admitting: Family Medicine

## 2015-12-17 ENCOUNTER — Telehealth: Payer: Self-pay

## 2015-12-17 ENCOUNTER — Ambulatory Visit (INDEPENDENT_AMBULATORY_CARE_PROVIDER_SITE_OTHER): Payer: 59 | Admitting: Family Medicine

## 2015-12-17 VITALS — BP 120/66 | HR 68 | Temp 97.6°F | Ht 72.0 in | Wt 192.0 lb

## 2015-12-17 DIAGNOSIS — R748 Abnormal levels of other serum enzymes: Secondary | ICD-10-CM | POA: Diagnosis not present

## 2015-12-17 DIAGNOSIS — R1032 Left lower quadrant pain: Secondary | ICD-10-CM | POA: Diagnosis not present

## 2015-12-17 DIAGNOSIS — I10 Essential (primary) hypertension: Secondary | ICD-10-CM

## 2015-12-17 DIAGNOSIS — K5730 Diverticulosis of large intestine without perforation or abscess without bleeding: Secondary | ICD-10-CM | POA: Insufficient documentation

## 2015-12-17 DIAGNOSIS — Z1211 Encounter for screening for malignant neoplasm of colon: Secondary | ICD-10-CM

## 2015-12-17 DIAGNOSIS — K5732 Diverticulitis of large intestine without perforation or abscess without bleeding: Secondary | ICD-10-CM

## 2015-12-17 DIAGNOSIS — R7989 Other specified abnormal findings of blood chemistry: Secondary | ICD-10-CM

## 2015-12-17 LAB — POCT I-STAT CREATININE: CREATININE: 1.2 mg/dL (ref 0.61–1.24)

## 2015-12-17 MED ORDER — SULFAMETHOXAZOLE-TRIMETHOPRIM 800-160 MG PO TABS: 1.0000 | ORAL_TABLET | Freq: Two times a day (BID) | ORAL | Status: DC

## 2015-12-17 MED ORDER — IOHEXOL 300 MG/ML  SOLN
100.0000 mL | Freq: Once | INTRAMUSCULAR | Status: AC | PRN
  Administered 2015-12-17: 100 mL via INTRAVENOUS

## 2015-12-17 MED ORDER — METRONIDAZOLE 500 MG PO TABS: 500.0000 mg | ORAL_TABLET | Freq: Three times a day (TID) | ORAL | Status: DC

## 2015-12-17 NOTE — Progress Notes (Signed)
Subjective:    Patient ID: Jacob Kerr, male    DOB: 10/30/1950, 65 y.o.   MRN: [id number]  HPI Patient here today for LLQ abdominal pain. This started about 3 weeks ago and at that time he noticed bright red blood in stool. None has been noticed since.He continues to have some left lower quadrant pain. About 1 year ago he did have a colonoscopy which showed severe descending diverticulosis and sigmoid diverticulosis. He had some in the transverse colon also. The patient has had the bleeding episode only wants. He has had continued left lower quadrant pain since this episode of bleeding that is made worse by certain movements and activity. A previous CT scan and colonoscopy was reviewed with the patient he was given copies of these reports. Both the colonoscopy and the CT scan confirmed the presence of severe diverticulosis in the left lower quadrant and transverse colon area. The patient denies any chest pain or shortness of breath. His bowel habits have also changed somewhat and they may be loose or may be formed and are not consistent with his normal bowel habits. He has not seen any more blood in the stool.    Patient Active Problem List   Diagnosis Date Noted  . Arrhythmia 10/08/2014  . TIA (transient ischemic attack) 08/09/2014  . Lateral meniscal tear 03/12/2014  . Testosterone deficiency 07/04/2013  . Cervicogenic headache 03/28/2013  . Vitamin D deficiency 01/01/2013  . Hyperlipidemia 01/01/2013  . BPH (benign prostatic hyperplasia) 11/03/2012  . Diverticulosis large intestine w/o perforation or abscess w/bleeding 11/03/2012  . Peripheral neuropathy ([place]) 11/03/2012  . Unspecified hypothyroidism 01/18/2011  . Unspecified essential hypertension 01/18/2011  . GERD (gastroesophageal reflux disease) 01/18/2011  . Cervical spondylosis without myelopathy 11/24/2010  . Headache(784.0) 11/24/2010  . Sleep disturbance, unspecified 11/24/2010  . Syringomyelia and syringobulbia ([place])  11/24/2010  . Disturbance of skin sensation 11/24/2010  . Cerebrovascular disease, unspecified 11/24/2010   Outpatient Encounter Prescriptions as of 12/17/2015  Medication Sig  . amLODipine (NORVASC) 10 MG tablet Take 1 tablet by mouth  daily  . aspirin 81 MG tablet Take 81 mg by mouth at bedtime.   . clobetasol cream (TEMOVATE) 8.32 % Apply 1 application topically daily.   . Coenzyme Q10 (CO Q 10) 100 MG CAPS Take 1 capsule by mouth daily.  . fluticasone (CUTIVATE) 0.05 % cream Apply 1 application topically daily.   Marland Kitchen ketoconazole (NIZORAL) 2 % cream APPLY TO AFFECTED AREA TO GROIN UP TO TWICE DAILY AS NEEDED  . losartan (COZAAR) 100 MG tablet Take 1 tablet (100 mg total) by mouth daily.  Marland Kitchen nystatin (MYCOSTATIN/NYSTOP) 100000 UNIT/GM POWD   . omeprazole (PRILOSEC) 20 MG capsule Take 1 capsule by mouth  twice daily  . SYNTHROID 175 MCG tablet Take 1 tablet by mouth  daily before breakfast  . tiZANidine (ZANAFLEX) 2 MG tablet Take 1 tablet (2 mg total) by mouth 2 (two) times daily.  . traMADol (ULTRAM) 50 MG tablet Take 1 tablet (50 mg total) by mouth every 6 (six) hours as needed.   No facility-administered encounter medications on file as of 12/17/2015.      Review of Systems  Constitutional: Negative.   HENT: Negative.   Eyes: Negative.   Respiratory: Negative.   Cardiovascular: Negative.   Gastrointestinal: Positive for abdominal pain (LLQ pain) and blood in stool (3 weeks ago).  Endocrine: Negative.   Genitourinary: Negative.   Musculoskeletal: Negative.   Skin: Negative.   Allergic/Immunologic: Negative.  Neurological: Negative.   Hematological: Negative.   Psychiatric/Behavioral: Negative.        Objective:   Physical Exam  Constitutional: He is oriented to person, place, and time. He appears well-developed and well-nourished. No distress.  HENT:  Head: Normocephalic and atraumatic.  Mouth/Throat: Oropharynx is clear and moist.  Eyes: Conjunctivae and EOM are  normal. Pupils are equal, round, and reactive to light. Right eye exhibits no discharge. Left eye exhibits no discharge. No scleral icterus.  Neck: Normal range of motion.  Cardiovascular: Normal rate, regular rhythm and normal heart sounds.   No murmur heard. Pulmonary/Chest: Effort normal and breath sounds normal. No respiratory distress. He has no wheezes. He has no rales.  Abdominal: Soft. Bowel sounds are normal. He exhibits no mass. There is tenderness. There is rebound. There is no guarding.  Musculoskeletal: Normal range of motion. He exhibits no edema.  Neurological: He is alert and oriented to person, place, and time.  Skin: Skin is warm and dry. No rash noted.  Psychiatric: He has a normal mood and affect. His behavior is normal. Judgment and thought content normal.  Nursing note and vitals reviewed.   BP 120/66 mmHg  Pulse 68  Temp(Src) 97.6 F (36.4 C) (Oral)  Ht 6' (1.829 m)  Wt 192 lb (87.091 kg)  BMI 26.03 kg/m2       Assessment & Plan:  1. Left lower quadrant pain -If pain worsens the patient should return to the office immediately or go to the emergency room - BMP8+EGFR - CBC with Differential/Platelet - CT Abdomen Pelvis W Contrast; Future - metroNIDAZOLE (FLAGYL) 500 MG tablet; Take 1 tablet (500 mg total) by mouth 3 (three) times daily.  Dispense: 30 tablet; Refill: 0 - sulfamethoxazole-trimethoprim (BACTRIM DS,SEPTRA DS) 800-160 MG tablet; Take 1 tablet by mouth 2 (two) times daily.  Dispense: 20 tablet; Refill: 0  2. Diverticulitis of colon -We will get CT scan of the abdomen to make sure there is no abscess in the area of the described pain - metroNIDAZOLE (FLAGYL) 500 MG tablet; Take 1 tablet (500 mg total) by mouth 3 (three) times daily.  Dispense: 30 tablet; Refill: 0 - sulfamethoxazole-trimethoprim (BACTRIM DS,SEPTRA DS) 800-160 MG tablet; Take 1 tablet by mouth 2 (two) times daily.  Dispense: 20 tablet; Refill: 0  3. Elevated serum  creatinine -Continue current blood pressure treatment  4. Essential hypertension, benign -Pressure is good today and no change in treatment  Patient Instructions  We will arrange for you to have a CT scan of the abdomen because of the amount of tenderness in the continued pain that you have had for the past 3 weeks. Stay away from roughage, peanuts, and any seedy type food Call immediately if pain worsens Take antibiotics as directed Return to clinic in 7-14 days Drink plenty of fluids   Arrie Senate MD

## 2015-12-17 NOTE — Addendum Note (Signed)
Addended by: Zannie Cove on: 12/17/2015 02:48 PM   Modules accepted: Orders

## 2015-12-17 NOTE — Telephone Encounter (Signed)
Patient did go to see the PCP. He has had labs and will have CT. Also atb's.

## 2015-12-17 NOTE — Patient Instructions (Signed)
We will arrange for you to have a CT scan of the abdomen because of the amount of tenderness in the continued pain that you have had for the past 3 weeks. Stay away from roughage, peanuts, and any seedy type food Call immediately if pain worsens Take antibiotics as directed Return to clinic in 7-14 days Drink plenty of fluids

## 2015-12-17 NOTE — Telephone Encounter (Signed)
Spoke with CT at [facility], CT abdomen is clear, no evidence of active diverticulitis, does show some diverticulosis, patient informed to go ahead with Flagyl & Bactrim and to let us know if pain worsens or does not get better.

## 2015-12-18 LAB — CBC WITH DIFFERENTIAL/PLATELET
BASOS ABS: 0 10*3/uL (ref 0.0–0.2)
Basos: 0 %
EOS (ABSOLUTE): 0.1 10*3/uL (ref 0.0–0.4)
EOS: 2 %
Hematocrit: 49.2 % (ref 37.5–51.0)
Hemoglobin: 17 g/dL (ref 12.6–17.7)
IMMATURE GRANULOCYTES: 0 %
Immature Grans (Abs): 0 10*3/uL (ref 0.0–0.1)
LYMPHS ABS: 1.4 10*3/uL (ref 0.7–3.1)
Lymphs: 23 %
MCH: 32.8 pg (ref 26.6–33.0)
MCHC: 34.6 g/dL (ref 31.5–35.7)
MCV: 95 fL (ref 79–97)
MONOCYTES: 10 %
MONOS ABS: 0.6 10*3/uL (ref 0.1–0.9)
Neutrophils Absolute: 4 10*3/uL (ref 1.4–7.0)
Neutrophils: 65 %
PLATELETS: 205 10*3/uL (ref 150–379)
RBC: 5.19 x10E6/uL (ref 4.14–5.80)
RDW: 13.2 % (ref 12.3–15.4)
WBC: 6.1 10*3/uL (ref 3.4–10.8)

## 2015-12-18 LAB — BMP8+EGFR
BUN / CREAT RATIO: 12 (ref 10–22)
BUN: 15 mg/dL (ref 8–27)
CHLORIDE: 102 mmol/L (ref 96–106)
CO2: 24 mmol/L (ref 18–29)
Calcium: 9.3 mg/dL (ref 8.6–10.2)
Creatinine, Ser: 1.22 mg/dL (ref 0.76–1.27)
GFR calc non Af Amer: 62 mL/min/{1.73_m2} (ref >59)
GFR, EST AFRICAN AMERICAN: 72 mL/min/{1.73_m2} (ref >59)
Glucose: 111 mg/dL — ABNORMAL HIGH (ref 65–99)
Potassium: 4.5 mmol/L (ref 3.5–5.2)
SODIUM: 143 mmol/L (ref 134–144)

## 2015-12-20 LAB — FECAL OCCULT BLOOD, IMMUNOCHEMICAL: FECAL OCCULT BLD: NEGATIVE

## 2015-12-25 ENCOUNTER — Ambulatory Visit: Payer: 59 | Admitting: Family Medicine

## 2015-12-31 ENCOUNTER — Other Ambulatory Visit: Payer: Self-pay | Admitting: *Deleted

## 2015-12-31 MED ORDER — LOSARTAN POTASSIUM 50 MG PO TABS: 50.0000 mg | ORAL_TABLET | Freq: Every day | ORAL | Status: DC

## 2016-01-23 ENCOUNTER — Telehealth: Payer: Self-pay | Admitting: Gastroenterology

## 2016-01-23 NOTE — Telephone Encounter (Signed)
Patient reports spells of BRB after a bowel movement. He varies from normal to loose bowel movements. He is not is acute pain. He has a discomfort in the RLQ if he pushes on the area. He has no fevers. The symptoms have been there for a week. The symptoms are not getting worse. He has Anusol suppositories. He will use these. Reviewed signs and symptoms that would necessitate going to the ER. He states he is very familiar with this. Should I bring him in with an APP?

## 2016-01-24 ENCOUNTER — Other Ambulatory Visit: Payer: Self-pay | Admitting: Family Medicine

## 2016-01-25 NOTE — Telephone Encounter (Signed)
Based on his complaints could be small volume bleed from internal hemorrhoids. Please schedule for office visit for evaluation and if it is hemorrhoidal, will have to consider band ligation.

## 2016-01-26 NOTE — Telephone Encounter (Signed)
Appointment made for the patient on the first available.

## 2016-01-29 ENCOUNTER — Ambulatory Visit (INDEPENDENT_AMBULATORY_CARE_PROVIDER_SITE_OTHER): Payer: 59 | Admitting: Physician Assistant

## 2016-01-29 ENCOUNTER — Encounter: Payer: Self-pay | Admitting: Physician Assistant

## 2016-01-29 ENCOUNTER — Other Ambulatory Visit (INDEPENDENT_AMBULATORY_CARE_PROVIDER_SITE_OTHER): Payer: 59

## 2016-01-29 ENCOUNTER — Encounter: Payer: Self-pay | Admitting: Gastroenterology

## 2016-01-29 VITALS — BP 110/70 | HR 84 | Ht 70.75 in | Wt 189.2 lb

## 2016-01-29 DIAGNOSIS — K5730 Diverticulosis of large intestine without perforation or abscess without bleeding: Secondary | ICD-10-CM | POA: Diagnosis not present

## 2016-01-29 DIAGNOSIS — K921 Melena: Secondary | ICD-10-CM

## 2016-01-29 DIAGNOSIS — R1032 Left lower quadrant pain: Secondary | ICD-10-CM

## 2016-01-29 LAB — CBC WITH DIFFERENTIAL/PLATELET
BASOS ABS: 0 10*3/uL (ref 0.0–0.1)
BASOS PCT: 0.6 % (ref 0.0–3.0)
EOS ABS: 0.1 10*3/uL (ref 0.0–0.7)
EOS PCT: 1 % (ref 0.0–5.0)
HEMATOCRIT: 48.2 % (ref 39.0–52.0)
HEMOGLOBIN: 16.5 g/dL (ref 13.0–17.0)
Lymphocytes Relative: 18.7 % (ref 12.0–46.0)
Lymphs Abs: 1.5 10*3/uL (ref 0.7–4.0)
MCHC: 34.3 g/dL (ref 30.0–36.0)
MCV: 94.4 fl (ref 78.0–100.0)
MONO ABS: 0.8 10*3/uL (ref 0.1–1.0)
MONOS PCT: 9.6 % (ref 3.0–12.0)
Neutro Abs: 5.5 10*3/uL (ref 1.4–7.7)
Neutrophils Relative %: 70.1 % (ref 43.0–77.0)
Platelets: 203 10*3/uL (ref 150.0–400.0)
RBC: 5.1 Mil/uL (ref 4.22–5.81)
RDW: 13.9 % (ref 11.5–15.5)
WBC: 7.9 10*3/uL (ref 4.0–10.5)

## 2016-01-29 LAB — HIGH SENSITIVITY CRP: CRP HIGH SENSITIVITY: 0.71 mg/L (ref 0.000–5.000)

## 2016-01-29 LAB — SEDIMENTATION RATE: SED RATE: 0 mm/h (ref 0–22)

## 2016-01-29 NOTE — Patient Instructions (Signed)
Please go to the basement level to have your labs drawn.  You have been scheduled for a flexible sigmoidoscopy. Please follow the written instructions given to you at your visit today.  If you use inhalers (even only as needed), please bring them with you on the day of your procedure. Your physician has requested that you go to www.startemmi.com and enter the access code given to you at your visit today. This web site gives a general overview about your procedure. However, you should still follow specific instructions given to you by our office regarding your preparation for the procedure.

## 2016-01-29 NOTE — Progress Notes (Signed)
Patient ID: Jacob Kerr, male   DOB: [birth year], 65 y.o.   MRN: [id number]   Subjective:    Patient ID: Jacob Kerr, male    DOB: Jul 01, 1951, 65 y.o.   MRN: [id number]  HPI Jacob Kerr is a pleasant 65 year old white male previously known to Dr. Arlyce Kerr who has history of recurrent diverticulitis, GERD, BPH, history of TIA, hypertension and peripheral neuropathy. He comes in today with complaints of left lower quadrant pain and intermittent rectal bleeding. He had undergone colonoscopy in April 2016 which showed severe diverticulosis in the descending and sigmoid colon and mild diverticulosis of the transverse colon as well as small internal hemorrhoids. Patient says he's been having trouble again since March 2017. He has been to his PCP Dr. Vernon Kerr in mid March with left lower quadrant pain which was similar to his prior episodes of diverticulitis. He was treated with a course of Bactrim and Flagyl and underwent CT of the abdomen and pelvis on 12/17/2015. This showed significant diverticulosis but no diverticulitis. He finished the course of antibiotics and says perhaps the antibiotics made the discomfort a little bit more tolerable but he's had ongoing symptoms since. He has constant left lower quadrant discomfort ,has been having narrower caliber stools alternating with loose stools and bleeding intermittently since March. He says the blood is usually on the bowel movement or mixed at the end of the bowel movement., and is always bright red. His had no complaints of rectal discomfort or other hemorrhoidal symptoms.  He mentions his weight is down about 16 pounds over the past 8 months but he has retired, says he doesn't get is hungry as he use to infrequently skips lunch. He worries about cancer and shows me 8 From his brother where they were discussing his sister's death secondary to "mesenteric adenocarcinoma". He wants to make sure that he doesn't have cancer.  Review of Systems Pertinent  positive and negative review of systems were noted in the above HPI section.  All other review of systems was otherwise negative.  Outpatient Encounter Prescriptions as of 01/29/2016  Medication Sig  . amLODipine (NORVASC) 10 MG tablet Take 1 tablet by mouth  daily  . aspirin 81 MG tablet Take 81 mg by mouth at bedtime.   . clobetasol cream (TEMOVATE) 0.05 % Apply 1 application topically daily.   . Coenzyme Q10 (CO Q 10) 100 MG CAPS Take 1 capsule by mouth daily.  . fluticasone (CUTIVATE) 0.05 % cream Apply 1 application topically daily.   Marland Kitchen ketoconazole (NIZORAL) 2 % cream APPLY TO AFFECTED AREA TO GROIN UP TO TWICE DAILY AS NEEDED  . losartan (COZAAR) 50 MG tablet Take 50 mg by mouth daily.  Marland Kitchen omeprazole (PRILOSEC) 20 MG capsule Take 1 capsule by mouth  twice daily  . SYNTHROID 175 MCG tablet Take 1 tablet by mouth  daily before breakfast  . traMADol (ULTRAM) 50 MG tablet Take 1 tablet (50 mg total) by mouth every 6 (six) hours as needed.  . [DISCONTINUED] losartan (COZAAR) 50 MG tablet Take 1 tablet (50 mg total) by mouth daily.  . [DISCONTINUED] losartan (COZAAR) 100 MG tablet TAKE 1 TABLET (100 MG TOTAL) BY MOUTH DAILY.  . [DISCONTINUED] metroNIDAZOLE (FLAGYL) 500 MG tablet Take 1 tablet (500 mg total) by mouth 3 (three) times daily.  . [DISCONTINUED] nystatin (MYCOSTATIN/NYSTOP) 100000 UNIT/GM POWD   . [DISCONTINUED] sulfamethoxazole-trimethoprim (BACTRIM DS,SEPTRA DS) 800-160 MG tablet Take 1 tablet by mouth 2 (two) times daily.  . [DISCONTINUED] tiZANidine (ZANAFLEX)  2 MG tablet Take 1 tablet (2 mg total) by mouth 2 (two) times daily.   No facility-administered encounter medications on file as of 01/29/2016.   Allergies  Allergen Reactions  . Robaxin [Methocarbamol] Other (See Comments)    Seeing "bright lights"  . Sudafed [Pseudoephedrine Hcl] Other (See Comments)    Increased heart rate  . Epinephrine Palpitations   Patient Active Problem List   Diagnosis Date Noted  .  Arrhythmia 10/08/2014  . TIA (transient ischemic attack) 08/09/2014  . Lateral meniscal tear 03/12/2014  . Testosterone deficiency 07/04/2013  . Cervicogenic headache 03/28/2013  . Vitamin D deficiency 01/01/2013  . Hyperlipidemia 01/01/2013  . BPH (benign prostatic hyperplasia) 11/03/2012  . Diverticulosis large intestine w/o perforation or abscess w/bleeding 11/03/2012  . Peripheral neuropathy (HCC) 11/03/2012  . Unspecified hypothyroidism 01/18/2011  . Unspecified essential hypertension 01/18/2011  . GERD (gastroesophageal reflux disease) 01/18/2011  . Cervical spondylosis without myelopathy 11/24/2010  . Headache(784.0) 11/24/2010  . Sleep disturbance, unspecified 11/24/2010  . Syringomyelia and syringobulbia (HCC) 11/24/2010  . Disturbance of skin sensation 11/24/2010  . Cerebrovascular disease, unspecified 11/24/2010   Social History   Social History  . Marital Status: Married    Spouse Name: N/A  . Number of Children: 2  . Years of Education: 12   Occupational History  . Retired    Social History Main Topics  . Smoking status: Never Smoker   . Smokeless tobacco: Never Used  . Alcohol Use: 0.0 oz/week    0 Standard drinks or equivalent per week     Comment: occasionally  . Drug Use: No  . Sexual Activity: Not on file   Other Topics Concern  . Not on file   Social History Narrative   Lives at home with wife.       Patient is right handed.   Patient drinks 1-2 cups of caffeine daily.    [name] family history includes Cancer in his sister and sister; Colon polyps in his brother and sister; Heart disease in his father; Hyperlipidemia in his sister and sister; Nephrolithiasis in his sister; Prostate cancer in his father; Stroke in his father. There is no history of Colon cancer or Esophageal cancer.      Objective:    Filed Vitals:   01/29/16 1328  BP: 110/70  Pulse: 84    Physical Exam  well-developed older white male in no acute distress, blood  pressure 110/70 pulse 84 height 5 foot 10 weight 189. HEENT; nontraumatic normocephalic EOMI PERRLA sclera anicteric, Cardiovascular; regular rate and rhythm with S1-S2 no murmur or gallop, Pulmonary ;clear bilaterally, Abdomen ;soft, bowel sounds are present he has mild tenderness in the left lower quadrant there is no palpable mass or hepatosplenomegaly, Rectal; exam not done today, Extremities; no clubbing cyanosis or edema skin warm and dry, Neuropsych; mood and affect appropriate     Assessment & Plan:   #1 65 yo Male with known severe diverticulosis and prior episodes of diverticulitis who has been having ongoing left lower quadrant discomfort and intermittent rectal bleeding over the past 2 months. This has been associated with narrow caliber stools. CT on 12/17/2015 did not show any active diverticulitis. Am concerned he may have SCAD(segmental colitis associated with diverticulosis)  #2 previously documented small internal hemorrhoids  Plan; we'll check CBC with differential, sedimentation rate and CRP Will schedule for flexible sigmoidoscopy with Dr. Myrtie Neither. Procedure discussed in detail with patient and he is agreeable to proceed Further plans pending findings of above.  Sumner Boesch S Deontrey Massi PA-C 01/29/2016   Cc: Ernestina Penna, MD

## 2016-01-30 NOTE — Progress Notes (Signed)
Thank you for sending this case to me. I have reviewed the entire note, and the outlined plan seems appropriate.  

## 2016-02-02 ENCOUNTER — Ambulatory Visit (AMBULATORY_SURGERY_CENTER): Payer: 59 | Admitting: Gastroenterology

## 2016-02-02 ENCOUNTER — Encounter: Payer: Self-pay | Admitting: Gastroenterology

## 2016-02-02 VITALS — BP 117/75 | HR 63 | Temp 98.4°F | Resp 12 | Ht 70.75 in | Wt 189.0 lb

## 2016-02-02 DIAGNOSIS — R1032 Left lower quadrant pain: Secondary | ICD-10-CM

## 2016-02-02 DIAGNOSIS — K649 Unspecified hemorrhoids: Secondary | ICD-10-CM | POA: Diagnosis not present

## 2016-02-02 MED ORDER — AMOXICILLIN-POT CLAVULANATE 875-125 MG PO TABS: 1.0000 | ORAL_TABLET | Freq: Two times a day (BID) | ORAL | Status: DC

## 2016-02-02 MED ORDER — SODIUM CHLORIDE 0.9 % IV SOLN: 500.0000 mL | INTRAVENOUS | Status: DC

## 2016-02-02 NOTE — Op Note (Signed)
[facility] Patient Name: Jacob Kerr Procedure Date: 02/02/2016 1:59 PM MRN: [id number] Endoscopist: Mallie Mussel L. Loletha Carrow , MD Age: 65 Date of Birth: 1950/11/17 Gender: Male Procedure:                Flexible Sigmoidoscopy Indications:              Abdominal pain in the left lower quadrant, Rectal                            hemorrhage Medicines:                Monitored Anesthesia Care Procedure:                Pre-Anesthesia Assessment:                           - Prior to the procedure, a History and Physical                            was performed, and patient medications and                            allergies were reviewed. The patient's tolerance of                            previous anesthesia was also reviewed. The risks                            and benefits of the procedure and the sedation                            options and risks were discussed with the patient.                            All questions were answered, and informed consent                            was obtained. Prior Anticoagulants: The patient has                            taken no previous anticoagulant or antiplatelet                            agents. ASA Grade Assessment: II - A patient with                            mild systemic disease. After reviewing the risks                            and benefits, the patient was deemed in                            satisfactory condition to undergo the procedure.  After obtaining informed consent, the scope was                            passed under direct vision. The Model CF-HQ190L                            [id number]) scope was introduced through the anus                            and advanced to the the left transverse colon. The                            flexible sigmoidoscopy was accomplished without                            difficulty. The patient tolerated the procedure                            well.  The quality of the bowel preparation was good. Scope In: Scope Out: Findings:                 The perianal and digital rectal examinations were                            normal.                           Many small and large-mouthed diverticula were found                            in the left colon. There was no colitis.                           Internal hemorrhoids were found during                            retroflexion. The hemorrhoids were medium-sized and                            Grade I (internal hemorrhoids that do not prolapse).                           The exam was otherwise without abnormality. Complications:            No immediate complications. Estimated Blood Loss:     Estimated blood loss: none. Impression:               - Diverticulosis in the left colon.                           - Internal hemorrhoids (which are the source of                            recent bleeding).                           -  The examination was otherwise normal.                           - No specimens collected. Recommendation:           - Augmentin (amoxicillin/clavulanate) 875 mg PO BID                            for 7 days for possible low-grade diverticulitis                            not evident on CT scan.                           - Alighn, Culturelle, or other OTC probiotic once                            at bedtime for 14 days - the duration of antibiotic                            treatment and 7 days afterwards.                           - Consider in-office hemorrhoidal banding therapy.  L. Loletha Carrow, MD 02/02/2016 2:30:31 PM This report has been signed electronically.

## 2016-02-02 NOTE — Patient Instructions (Signed)
Augmentin sent to your pharmacy Align, Culturelle or other OTC probiotic once at bedtime for 14 days. Consider in office hemorrhoidal banding therapy.  YOU HAD AN ENDOSCOPIC PROCEDURE TODAY AT [facility]:   Refer to the procedure report that was given to you for any specific questions about what was found during the examination.  If the procedure report does not answer your questions, please call your gastroenterologist to clarify.  If you requested that your care partner not be given the details of your procedure findings, then the procedure report has been included in a sealed envelope for you to review at your convenience later.  YOU SHOULD EXPECT: Some feelings of bloating in the abdomen. Passage of more gas than usual.  Walking can help get rid of the air that was put into your GI tract during the procedure and reduce the bloating. If you had a lower endoscopy (such as a colonoscopy or flexible sigmoidoscopy) you may notice spotting of blood in your stool or on the toilet paper. If you underwent a bowel prep for your procedure, you may not have a normal bowel movement for a few days.  Please Note:  You might notice some irritation and congestion in your nose or some drainage.  This is from the oxygen used during your procedure.  There is no need for concern and it should clear up in a day or so.  SYMPTOMS TO REPORT IMMEDIATELY:   Following lower endoscopy (colonoscopy or flexible sigmoidoscopy):  Excessive amounts of blood in the stool  Significant tenderness or worsening of abdominal pains  Swelling of the abdomen that is new, acute  Fever of 100F or higher   For urgent or emergent issues, a gastroenterologist can be reached at any hour by calling [phone].   DIET: Your first meal following the procedure should be a small meal and then it is ok to progress to your normal diet. Heavy or fried foods are harder to digest and may make you feel nauseous or bloated.   Likewise, meals heavy in dairy and vegetables can increase bloating.  Drink plenty of fluids but you should avoid alcoholic beverages for 24 hours.  ACTIVITY:  You should plan to take it easy for the rest of today and you should NOT DRIVE or use heavy machinery until tomorrow (because of the sedation medicines used during the test).    FOLLOW UP: Our staff will call the number listed on your records the next business day following your procedure to check on you and address any questions or concerns that you may have regarding the information given to you following your procedure. If we do not reach you, we will leave a message.  However, if you are feeling well and you are not experiencing any problems, there is no need to return our call.  We will assume that you have returned to your regular daily activities without incident.  If any biopsies were taken you will be contacted by phone or by letter within the next 1-3 weeks.  Please call us at [phone] if you have not heard about the biopsies in 3 weeks.    SIGNATURES/CONFIDENTIALITY: You and/or your care partner have signed paperwork which will be entered into your electronic medical record.  These signatures attest to the fact that that the information above on your After Visit Summary has been reviewed and is understood.  Full responsibility of the confidentiality of this discharge information lies with you and/or your care-partner.

## 2016-02-02 NOTE — Progress Notes (Signed)
To pacu vss patent aw report to rn 

## 2016-02-03 ENCOUNTER — Telehealth: Payer: Self-pay | Admitting: *Deleted

## 2016-02-03 ENCOUNTER — Ambulatory Visit: Payer: 59 | Admitting: Physician Assistant

## 2016-02-03 NOTE — Telephone Encounter (Signed)
  Follow up Call-  Call back number 02/02/2016 12/30/2014  Post procedure Call Back phone  # [phone] cell [phone]  Permission to leave phone message Yes Yes     Patient questions:  Do you have a fever, pain , or abdominal swelling? No. Pain Score  0 *  Have you tolerated food without any problems? Yes.    Have you been able to return to your normal activities? Yes.    Do you have any questions about your discharge instructions: Diet   No. Medications  No. Follow up visit  No.  Do you have questions or concerns about your Care? No.  Actions: * If pain score is 4 or above: No action needed, pain <4.

## 2016-03-13 ENCOUNTER — Other Ambulatory Visit: Payer: Self-pay | Admitting: Family Medicine

## 2016-03-22 ENCOUNTER — Telehealth: Payer: Self-pay | Admitting: Neurology

## 2016-03-22 DIAGNOSIS — M4712 Other spondylosis with myelopathy, cervical region: Secondary | ICD-10-CM

## 2016-03-22 NOTE — Telephone Encounter (Signed)
Patient called regarding nerve ablation between C-2 and C-3, states he's getting cold feet, states he's read there's a high frequency ablation might be a good first step. Would like to talk to someone about this. States mid thoracic back and rib cage, going under right shoulder blade is constant nuisance.

## 2016-03-22 NOTE — Telephone Encounter (Signed)
I called patient. The patient is having discomfort in the base of the skull, into the face. A local anesthetic injection seemed to help him transiently, he has been set up on July 6 for a nerve block at the C2-3 level. He would like another opinion regarding this, possibly using high frequency ablation. He has been to the [facility] pain center in [place] previously, I'll get a referral set up for them to reevaluate him.

## 2016-04-19 ENCOUNTER — Ambulatory Visit (INDEPENDENT_AMBULATORY_CARE_PROVIDER_SITE_OTHER): Payer: 59 | Admitting: Family Medicine

## 2016-04-19 ENCOUNTER — Encounter: Payer: Self-pay | Admitting: Family Medicine

## 2016-04-19 VITALS — BP 134/84 | HR 71 | Temp 97.6°F | Ht 70.75 in | Wt 187.6 lb

## 2016-04-19 DIAGNOSIS — B029 Zoster without complications: Secondary | ICD-10-CM | POA: Diagnosis not present

## 2016-04-19 MED ORDER — VALACYCLOVIR HCL 1 G PO TABS: 1000.0000 mg | ORAL_TABLET | Freq: Three times a day (TID) | ORAL | 0 refills | Status: DC

## 2016-04-19 NOTE — Progress Notes (Signed)
BP 134/84 (BP Location: Left Arm, Patient Position: Sitting, Cuff Size: Large)   Pulse 71   Temp 97.6 F (36.4 C) (Oral)   Ht 5' 10.75" (1.797 m)   Wt 187 lb 9.6 oz (85.1 kg)   BMI 26.35 kg/m    Subjective:    Patient ID: Jacob Kerr, male    DOB: November 02, 1950, 65 y.o.   MRN: [id number]  HPI: Jacob Kerr is a 65 y.o. male presenting on 04/19/2016 for Rash (stinging rash on right side, started 5 days ago with one small bump on side)   HPI Rash Patient has been having a rash that started on his right lower back and then has come around to his abdomen on the right side. The rash is been associated with a lot of tenderness and irritation and itching. He is concerned that he has shingles. He denies any fevers or chills or any rash anywhere else besides this patch. The rash started out as small bumps and then has developed into more of those.  Relevant past medical, surgical, family and social history reviewed and updated as indicated. Interim medical history since our last visit reviewed. Allergies and medications reviewed and updated.  Review of Systems  Constitutional: Negative for fever.  HENT: Negative for ear discharge and ear pain.   Eyes: Negative for discharge and visual disturbance.  Respiratory: Negative for shortness of breath and wheezing.   Cardiovascular: Negative for chest pain and leg swelling.  Gastrointestinal: Negative for abdominal pain, constipation and diarrhea.  Genitourinary: Negative for difficulty urinating.  Musculoskeletal: Negative for back pain and gait problem.  Skin: Positive for rash.  Neurological: Negative for syncope, light-headedness and headaches.  All other systems reviewed and are negative.   Per HPI unless specifically indicated above     Medication List       Accurate as of 04/19/16 10:43 AM. Always use your most recent med list.          amLODipine 10 MG tablet Commonly known as:  NORVASC Take 1 tablet by mouth  daily   aspirin 81 MG tablet Take 81 mg by mouth at bedtime.   clobetasol cream 0.05 % Commonly known as:  TEMOVATE Apply 1 application topically daily. Reported on 02/02/2016   Co Q 10 100 MG Caps Take 1 capsule by mouth daily.   fluticasone 0.05 % cream Commonly known as:  CUTIVATE Apply 1 application topically daily.   ketoconazole 2 % cream Commonly known as:  NIZORAL APPLY TO AFFECTED AREA TO GROIN UP TO TWICE DAILY AS NEEDED   losartan 50 MG tablet Commonly known as:  COZAAR Take 50 mg by mouth daily.   omeprazole 20 MG capsule Commonly known as:  PRILOSEC Take 1 capsule by mouth  twice daily   SYNTHROID 175 MCG tablet Generic drug:  levothyroxine Take 1 tablet by mouth  daily before breakfast   traMADol 50 MG tablet Commonly known as:  ULTRAM Take 1 tablet (50 mg total) by mouth every 6 (six) hours as needed.   valACYclovir 1000 MG tablet Commonly known as:  VALTREX Take 1 tablet (1,000 mg total) by mouth 3 (three) times daily.          Objective:    BP 134/84 (BP Location: Left Arm, Patient Position: Sitting, Cuff Size: Large)   Pulse 71   Temp 97.6 F (36.4 C) (Oral)   Ht 5' 10.75" (1.797 m)   Wt 187 lb 9.6 oz (85.1 kg)   BMI  26.35 kg/m   Wt Readings from Last 3 Encounters:  04/19/16 187 lb 9.6 oz (85.1 kg)  02/02/16 189 lb (85.7 kg)  01/29/16 189 lb 4 oz (85.8 kg)    Physical Exam  Constitutional: He is oriented to person, place, and time. He appears well-developed and well-nourished. No distress.  Eyes: Conjunctivae and EOM are normal. Pupils are equal, round, and reactive to light. Right eye exhibits no discharge. No scleral icterus.  Neck: Neck supple. No thyromegaly present.  Cardiovascular: Normal rate, regular rhythm, normal heart sounds and intact distal pulses.   No murmur heard. Pulmonary/Chest: Effort normal and breath sounds normal. No respiratory distress. He has no wheezes.  Musculoskeletal: Normal range of motion. He exhibits no edema.   Lymphadenopathy:    He has no cervical adenopathy.  Neurological: He is alert and oriented to person, place, and time. Coordination normal.  Skin: Skin is warm and dry. Rash noted. Rash is papular (Rash is on small patch on right lumbar following the nerve around to the right abdomen. Multiple small papules and a few vesicles. No warmth or erythema or drainage noted.) and vesicular. He is not diaphoretic.  Psychiatric: He has a normal mood and affect. His behavior is normal.  Nursing note and vitals reviewed.      Assessment & Plan:   Problem List Items Addressed This Visit    None    Visit Diagnoses    Shingles    -  Primary   Relevant Medications   valACYclovir (VALTREX) 1000 MG tablet       Follow up plan: Return if symptoms worsen or fail to improve.  Counseling provided for all of the vaccine components No orders of the defined types were placed in this encounter.   Caryl Pina, MD [facility] 04/19/2016, 10:43 AM

## 2016-04-27 ENCOUNTER — Other Ambulatory Visit: Payer: Self-pay | Admitting: Family Medicine

## 2016-04-28 ENCOUNTER — Encounter: Payer: Self-pay | Admitting: Gastroenterology

## 2016-04-28 ENCOUNTER — Other Ambulatory Visit: Payer: Self-pay

## 2016-04-28 ENCOUNTER — Ambulatory Visit (INDEPENDENT_AMBULATORY_CARE_PROVIDER_SITE_OTHER): Payer: 59 | Admitting: Gastroenterology

## 2016-04-28 VITALS — BP 128/80 | HR 76 | Ht 70.75 in | Wt 187.4 lb

## 2016-04-28 DIAGNOSIS — K625 Hemorrhage of anus and rectum: Secondary | ICD-10-CM | POA: Diagnosis not present

## 2016-04-28 DIAGNOSIS — K648 Other hemorrhoids: Secondary | ICD-10-CM

## 2016-04-28 DIAGNOSIS — K5731 Diverticulosis of large intestine without perforation or abscess with bleeding: Secondary | ICD-10-CM

## 2016-04-28 DIAGNOSIS — R1032 Left lower quadrant pain: Secondary | ICD-10-CM | POA: Diagnosis not present

## 2016-04-28 DIAGNOSIS — E034 Atrophy of thyroid (acquired): Secondary | ICD-10-CM

## 2016-04-28 MED ORDER — HYOSCYAMINE SULFATE 0.125 MG SL SUBL: 0.1250 mg | SUBLINGUAL_TABLET | Freq: Four times a day (QID) | SUBLINGUAL | 2 refills | Status: DC | PRN

## 2016-04-28 NOTE — Progress Notes (Signed)
[facility] Progress Note  Chief Complaint: Left lower quadrant pain  Subjective  History:  Please see [place] office note from 01/29/2016, and my sigmoidoscopy report from shortly after that. Briefly, this patient has chronic left lower quadrant pain and diverticulosis. He continues to have intermittent left lower quadrant pain that might last hours or days. There was no better after a trial of antibiotics in May. CT scans shortly before that showed no inflammation of the sigmoid. Sigmoidoscopy showed no colitis. He is having chronic troubles with lumbar muscular pain for which she has seen the sports medicine physician and has started some yogurt and other stretching techniques. He wonders if perhaps his hunched over positioning from the back pain may be leading to his LLQ pain. Bowel habits tend to be more loose or soft when he is having the left lower quadrant pain, and he has rectal bleeding perhaps once a month from his internal hemorrhoids that were seen on sigmoidoscopy.  ROS: Cardiovascular:  no chest pain Respiratory: no dyspnea  The patient's Past Medical, Family and Social History were reviewed and are on file in the EMR.  Objective:  Med list reviewed  Vital signs in last 24 hrs: Vitals:   04/28/16 0822  BP: 128/80  Pulse: 76    Physical Exam He is clearly uncomfortable from his back, frequency standing and stretching during the encounter.  HEENT: sclera anicteric, oral mucosa moist without lesions  Neck: supple, no thyromegaly, JVD or lymphadenopathy  Cardiac: RRR without murmurs, S1S2 heard, no peripheral edema  Pulm: clear to auscultation bilaterally, normal RR and effort noted  Abdomen: soft, no tenderness, with active bowel sounds. No guarding or palpable hepatosplenomegaly.  Skin; warm and dry, no jaundice or rash    @ASSESSMENTPLANBEGIN @ Assessment: Encounter Diagnoses  Name Primary?  . Diverticulosis large intestine w/o perforation or  abscess w/bleeding Yes  . LLQ abdominal pain   . Rectal bleed   . Internal bleeding hemorrhoids     I think he has diverticular spasm/functional pain, since he has not improved with trial of antibiotics for possible subclinical diverticulitis, nor does he have SCAD on sigmoidoscopy. His intermittent bleeding is hemorrhoidal  Plan:  Trial of Levsin I gave him a brochure to consider hemorrhoidal banding  Total time 25 minutes, over half spent in counseling and coordination of care.  Topics discussed: Diverticulosis and associated pain, and description of hemorrhoidal banding technique and outcome.  Nelida Meuse III

## 2016-04-28 NOTE — Patient Instructions (Addendum)
We have sent the following medications to your pharmacy for you to pick up at your convenience: Levsin  If you are age 65 or older, your body mass index should be between 23-30. Your Body mass index is 26.32 kg/m. If this is out of the aforementioned range listed, please consider follow up with your Primary Care Provider.  If you are age 96 or younger, your body mass index should be between 19-25. Your Body mass index is 26.32 kg/m. If this is out of the aformentioned range listed, please consider follow up with your Primary Care Provider.   Thank you for choosing [facility]  Dr Wilfrid Lund III

## 2016-05-05 ENCOUNTER — Other Ambulatory Visit: Payer: 59

## 2016-05-05 DIAGNOSIS — E034 Atrophy of thyroid (acquired): Secondary | ICD-10-CM

## 2016-05-06 ENCOUNTER — Telehealth: Payer: Self-pay | Admitting: *Deleted

## 2016-05-06 LAB — THYROID PANEL WITH TSH
Free Thyroxine Index: 2.3 (ref 1.2–4.9)
T3 UPTAKE RATIO: 27 % (ref 24–39)
T4, Total: 8.4 ug/dL (ref 4.5–12.0)
TSH: 3.28 u[IU]/mL (ref 0.450–4.500)

## 2016-05-06 MED ORDER — SYNTHROID 175 MCG PO TABS: ORAL_TABLET | ORAL | 3 refills | Status: DC

## 2016-05-06 NOTE — Telephone Encounter (Signed)
-----   Message from Chipper Herb, MD sent at 05/06/2016  7:10 AM EDT ----- All thyroid function tests including the TSH are within normal limits. This means that the patient is taking appropriate amounts of thyroid and he should continue with current treatment. He should probably have the thyroid profile repeated in 6 months to confirm stability.

## 2016-05-06 NOTE — Telephone Encounter (Signed)
Pt notified of results Verbalizes understanding Refill sent into mail order per pt request Okayed per Dr Laurance Flatten

## 2016-05-26 ENCOUNTER — Telehealth: Payer: Self-pay | Admitting: Gastroenterology

## 2016-05-26 NOTE — Telephone Encounter (Signed)
The pt has been added to 06/22/16 11 am for banding #1, he is aware and will call should he want to change the appt;

## 2016-05-28 HISTORY — PX: RADIOFREQUENCY ABLATION NERVES: SUR1070

## 2016-06-02 DIAGNOSIS — G588 Other specified mononeuropathies: Secondary | ICD-10-CM | POA: Insufficient documentation

## 2016-06-04 ENCOUNTER — Other Ambulatory Visit: Payer: Self-pay | Admitting: Family Medicine

## 2016-06-22 ENCOUNTER — Encounter: Payer: Self-pay | Admitting: Gastroenterology

## 2016-06-22 ENCOUNTER — Ambulatory Visit (INDEPENDENT_AMBULATORY_CARE_PROVIDER_SITE_OTHER): Payer: 59 | Admitting: Gastroenterology

## 2016-06-22 VITALS — BP 136/80 | HR 72 | Ht 70.75 in | Wt 185.0 lb

## 2016-06-22 DIAGNOSIS — K648 Other hemorrhoids: Secondary | ICD-10-CM

## 2016-06-22 DIAGNOSIS — K594 Anal spasm: Secondary | ICD-10-CM | POA: Diagnosis not present

## 2016-06-22 MED ORDER — AMBULATORY NON FORMULARY MEDICATION: 0 refills | Status: DC

## 2016-06-22 NOTE — Progress Notes (Signed)
PROCEDURE NOTE: The patient presents with symptomatic grade 2 hemorrhoids, requesting rubber band ligation of his/her hemorrhoidal disease. All risks, benefits and alternative forms of therapy were described and informed consent was obtained.   The anorectum was pre-medicated with 0.125% NTG and 5% lidocaine The decision was made to band the LL internal hemorrhoids, and the [place] was used to perform band ligation without complication. Digital anorectal examination was then performed to assure proper positioning of the band, and to adjust the banded tissue as required. The patient was discharged home without pain or other issues. Dietary and behavioral recommendations were given and along with follow-up instructions.   The following adjunctive treatments were recommended:  NTG ointment for anal spasm (possible related to neuro disease0  The patient will return 3 weeks  for follow-up and possible additional banding as required.  (If so, perform on right lateral aspect) No complications were encountered and the patient tolerated the procedure well.

## 2016-06-22 NOTE — Patient Instructions (Signed)
If you are age 65 or older, your body mass index should be between 23-30. Your Body mass index is 25.98 kg/m. If this is out of the aforementioned range listed, please consider follow up with your Primary Care Provider.  If you are age 54 or younger, your body mass index should be between 19-25. Your Body mass index is 25.98 kg/m. If this is out of the aformentioned range listed, please consider follow up with your Primary Care Provider.   HEMORRHOID BANDING PROCEDURE    FOLLOW-UP CARE   1. The procedure you have had should have been relatively painless since the banding of the area involved does not have nerve endings and there is no pain sensation.  The rubber band cuts off the blood supply to the hemorrhoid and the band may fall off as soon as 48 hours after the banding (the band may occasionally be seen in the toilet bowl following a bowel movement). You may notice a temporary feeling of fullness in the rectum which should respond adequately to plain Tylenol or Motrin.  2. Following the banding, avoid strenuous exercise that evening and resume full activity the next day.  A sitz bath (soaking in a warm tub) or bidet is soothing, and can be useful for cleansing the area after bowel movements.     3. To avoid constipation, take two tablespoons of natural wheat bran, natural oat bran, flax, Benefiber or any over the counter fiber supplement and increase your water intake to 7-8 glasses daily.    4. Unless you have been prescribed anorectal medication, do not put anything inside your rectum for two weeks: No suppositories, enemas, fingers, etc.  5. Occasionally, you may have more bleeding than usual after the banding procedure.  This is often from the untreated hemorrhoids rather than the treated one.  Don't be concerned if there is a tablespoon or so of blood.  If there is more blood than this, lie flat with your bottom higher than your head and apply an ice pack to the area. If the bleeding  does not stop within a half an hour or if you feel faint, call our office at [phone] or go to the emergency room.  6. Problems are not common; however, if there is a substantial amount of bleeding, severe pain, chills, fever or difficulty passing urine (very rare) or other problems, you should call us at [phone] or report to the nearest emergency room.  7. Do not stay seated continuously for more than 2-3 hours for a day or two after the procedure.  Tighten your buttock muscles 10-15 times every two hours and take 10-15 deep breaths every 1-2 hours.  Do not spend more than a few minutes on the toilet if you cannot empty your bowel; instead re-visit the toilet at a later time.    Patient Drug Education for Nitroglycerin Ointment  Nitroglycerin ointment (NTG) is used to help heal anal fissures. The ointment relaxes the smooth muscle around the anus and promotes blood flow which helps heal the fissure (tear). The NTG reduces anal canal pressure, which diminishes pain and spasm. We use a diluted concentration of NTG (.125%) compared to the 2% that is typically used for heart patients, and this is why you need to obtain the medication from a pharmacy which will compound your prescription.  The NTG ointment should be applied 3 times per day, or as directed.  A pea-sized drop should be placed on the tip of your finger  and then gently placed inside the anus. The finger should be inserted 1/3 - 1/2 its length and may be covered with a plastic glove or finger cot. You may use Vaseline to help coat the finger or dilute the ointment. If you are advised to mix the NTG with steroid ointment, limit the steroids to one to two weeks.  The first few applications should be taken lying down, as mild light-headedness or a brief headache may occur.  It may take several weeks for the fissure to begin healing, and you will need to continue taking the medication after resolution of your symptoms.  It is  important to continue the treatment for the entire time period - up to 3 months or as directed. It takes up to two years for the healing tissue to regain the normal skin strength. You will be advised to add fiber to your diet, increase water intake to 7-8 glasses per day, take relaxing baths or sitz baths, and avoid prolonged sitting and straining on the commode. Local anesthetic ointment may be added.  Initially, the anal fissure is very inflamed, which allows more of the NTG to get into the blood. This allows for a higher incidence of the most common side effect - a headache. It is usually brief and mild, but may require Tylenol or Advil. You may dilute the NTG further with Vaseline to decrease the headaches. As the treatment progresses and the fissure begins to heal, the headaches will tend to dissipate. Other side effects include lightheadedness, flushing, dizziness, nervousness, nausea, and vomiting. If any of these side effects persist or worsen, notify us promptly. Stop using the NTG and notify us immediately if you develop the rare side effects of severe dizziness, fainting, fast/pounding heartbeat, paleness, sweating, blurred vision, dry mouth, dark urine, bluish lips/skin/nails, unusual tiredness, severe weakness, irregular heartbeat, seizures, or chest pain. Serious allergic reactions are unusual, but seek immediate medical attention if you develop a rash, swelling, dizziness, or trouble breathing.  Tell us if you are allergic to nitrates, have severe anemia, low blood pressure, dehydration, chronic heart failure, cardiomyopathy, recent heart attack, increased pressure in the brain, or exposure to nitrates while on the job. Do not use NTG while driving or working around machinery if you are drowsy, dizzy, have lightheadedness, or blurred vision. Limit alcoholic beverages. To minimize dizziness and lightheadedness, get up slowly when rising from a sitting or lying position. The elderly may be more  prone to dizziness and falling. While there are not adequate studies to confirm the safety of NTG in pregnant or breast feeding women, it has been used without incident so far. We recommend waiting at least one hour after applying the NTG ointment before breast feeding.  Do not use NTG ointment if you are taking drugs for sexual problems [e.g., sildenafil (Viagra), tadalafil (Cialis), vardenafil (Levitra)]. Use caution before taking cough-and-cold products, diet aids, or NSAIDs preparations because they may contain ingredients that could increase your blood pressure, cause a fast heartbeat, or increase chest pain (e.g., pseudoephedrine, phenylephrine, chlorpheniramine, diphenhydramine, clemastine, ibuprofen, and naproxen). Tell us if you drink alcohol, take alteplase, migraine drugs (ergotamine), water pills/diuretics such as furosemide or hydrochlorothiazide, or other drugs for high blood pressure (beta blockers, calcium channel blockers, ACE inhibitors).  Store the NTG at room temperature and keep away from light and moisture. Close the container tightly after each use. Do not store in the bathroom. Keep away from children and pets. If you have any questions or problems please  call us at [phone].  Thank you for choosing [facility]  Dr Wilfrid Lund III

## 2016-07-08 ENCOUNTER — Encounter: Payer: Self-pay | Admitting: Gastroenterology

## 2016-07-08 ENCOUNTER — Ambulatory Visit (INDEPENDENT_AMBULATORY_CARE_PROVIDER_SITE_OTHER): Payer: 59 | Admitting: Gastroenterology

## 2016-07-08 VITALS — BP 138/72 | HR 78 | Ht 71.0 in | Wt 182.0 lb

## 2016-07-08 DIAGNOSIS — K649 Unspecified hemorrhoids: Secondary | ICD-10-CM | POA: Diagnosis not present

## 2016-07-08 NOTE — Progress Notes (Signed)
PROCEDURE NOTE: The patient presents with symptomatic grade 2 hemorrhoids, requesting rubber band ligation of his/her hemorrhoidal disease. All risks, benefits and alternative forms of therapy were described and informed consent was obtained.  DRE revealed: no fissure or tenderness   The anorectum was pre-medicated with 0.125% NTG and lubricant. The decision was made to band the Left lateral internal hemorrhoids (due to prior surgery), and the Mon[place] was used to perform band ligation without complication. Digital anorectal examination was then performed to assure proper positioning of the band, and to adjust the banded tissue as required. The patient was discharged home without pain or other issues. Dietary and behavioral recommendations were given and along with follow-up instructions.   The following adjunctive treatments were recommended:  none  The patient will return as needed  for follow-up and possible additional banding as required. No complications were encountered and the patient tolerated the procedure well.

## 2016-07-08 NOTE — Patient Instructions (Signed)
HEMORRHOID BANDING PROCEDURE    FOLLOW-UP CARE   1. The procedure you have had should have been relatively painless since the banding of the area involved does not have nerve endings and there is no pain sensation.  The rubber band cuts off the blood supply to the hemorrhoid and the band may fall off as soon as 48 hours after the banding (the band may occasionally be seen in the toilet bowl following a bowel movement). You may notice a temporary feeling of fullness in the rectum which should respond adequately to plain Tylenol or Motrin.  2. Following the banding, avoid strenuous exercise that evening and resume full activity the next day.  A sitz bath (soaking in a warm tub) or bidet is soothing, and can be useful for cleansing the area after bowel movements.     3. To avoid constipation, take two tablespoons of natural wheat bran, natural oat bran, flax, Benefiber or any over the counter fiber supplement and increase your water intake to 7-8 glasses daily.    4. Unless you have been prescribed anorectal medication, do not put anything inside your rectum for two weeks: No suppositories, enemas, fingers, etc.  5. Occasionally, you may have more bleeding than usual after the banding procedure.  This is often from the untreated hemorrhoids rather than the treated one.  Don't be concerned if there is a tablespoon or so of blood.  If there is more blood than this, lie flat with your bottom higher than your head and apply an ice pack to the area. If the bleeding does not stop within a half an hour or if you feel faint, call our office at [phone] or go to the emergency room.  6. Problems are not common; however, if there is a substantial amount of bleeding, severe pain, chills, fever or difficulty passing urine (very rare) or other problems, you should call us at [phone] or report to the nearest emergency room.  7. Do not stay seated continuously for more than 2-3 hours for a day or two  after the procedure.  Tighten your buttock muscles 10-15 times every two hours and take 10-15 deep breaths every 1-2 hours.  Do not spend more than a few minutes on the toilet if you cannot empty your bowel; instead re-visit the toilet at a later time.   Thank you for choosing [facility]  Dr Wilfrid Lund III

## 2016-08-10 ENCOUNTER — Encounter: Payer: Self-pay | Admitting: Neurology

## 2016-08-10 ENCOUNTER — Ambulatory Visit (INDEPENDENT_AMBULATORY_CARE_PROVIDER_SITE_OTHER): Payer: 59 | Admitting: Neurology

## 2016-08-10 VITALS — Ht 71.0 in | Wt 190.5 lb

## 2016-08-10 DIAGNOSIS — G6289 Other specified polyneuropathies: Secondary | ICD-10-CM

## 2016-08-10 DIAGNOSIS — G950 Syringomyelia and syringobulbia: Secondary | ICD-10-CM

## 2016-08-10 NOTE — Progress Notes (Signed)
Reason for visit: Cervical syrinx, cervicogenic headache  Jacob Kerr is an 65 y.o. male  History of present illness:  [name] is a 65 year old right-handed white male with a history of chronic neck pain, the patient has a history of a cervical cord syrinx. He has been followed through a pain center, he is seen through the [facility]. The patient has gained good improvement. He is also undergoing regular stretching exercises, he takes yoga with some benefit. The patient still has headaches but they are more of a pressure sensation in the head. The patient has had positional vertigo that has responded to the Epley maneuvers. Overall, the patient believes that he is doing better. He returns for an evaluation. He has gone off the Ultram completely.  Past Medical History:  Diagnosis Date  . Acute meniscal tear of left knee   . Allergic rhinitis   . BPH (benign prostatic hypertrophy)   . Cervical spondylosis without myelopathy   . Cervicogenic headache   . Chronic neck pain    uses TENS unit  prn  . Diverticulitis   . GERD (gastroesophageal reflux disease)   . H/O hiatal hernia   . History of diverticulitis   . History of kidney stones   . History of TIA (transient ischemic attack)    04/ 2011---  no residuals  . Hypertension   . Hypothyroidism   . Peripheral neuropathy ([place])   . Shingles   . Syrinx of spinal cord ([place])    C4 -- C7    Past Surgical History:  Procedure Laterality Date  . CARDIAC CATHETERIZATION  02-18-2010   dr Angelena Form   normal coronary arteries/  normal lvsf--  ef 65%  . COLONOSCOPY    . [place]  . KNEE ARTHROSCOPY Left 03/13/2014   Procedure: LEFT ARTHROSCOPY KNEE WITH DEBRIDMENT;  Surgeon: Gearlean Alf, MD;  Location: [facility];  Service: Orthopedics;  Laterality: Left;  . LAPAROSCOPIC CHOLECYSTECTOMY  01-30-2003  . NEGATIVE SLEEP STUDY  2013   per pt  . RADIOFREQUENCY ABLATION NERVES  05/2016  .  REMOVAL FORGEIN BODY FROM EAR  AGE 3  . RIGHT URETEROSCOPIC LASER LITHOTRIPSY STONE EXTRACTION /  STENT PLACEMENT  05-10-2000  . TRANSTHORACIC ECHOCARDIOGRAM  01-16-2010   normal lvf/  ef  60%/  mild lae    Family History  Problem Relation Age of Onset  . Prostate cancer Father   . Heart disease Father   . Stroke Father   . Nephrolithiasis Sister   . Cancer Sister     Intestinal  . Hyperlipidemia Sister   . Hyperlipidemia Sister   . Cancer Sister     Sinus  . COPD    . Colon polyps Brother     x2  . Colon polyps Sister   . Colon cancer Neg Hx   . Esophageal cancer Neg Hx     Social history:  reports that he has never smoked. He has never used smokeless tobacco. He reports that he drinks alcohol. He reports that he does not use drugs.    Allergies  Allergen Reactions  . Robaxin [Methocarbamol] Other (See Comments)    Seeing "bright lights"  . Sudafed [Pseudoephedrine Hcl] Other (See Comments)    Increased heart rate  . Epinephrine Palpitations    Medications:  Prior to Admission medications   Medication Sig Start Date End Date Taking? Authorizing Provider  amLODipine (NORVASC) 10 MG tablet Take 1 tablet by  mouth  daily 09/03/15  Yes Minus Breeding, MD  aspirin 81 MG tablet Take 81 mg by mouth at bedtime.    Yes Historical Provider, MD  Coenzyme Q10 (CO Q 10) 100 MG CAPS Take 1 capsule by mouth daily.   Yes Historical Provider, MD  hyoscyamine (LEVSIN SL) 0.125 MG SL tablet Place 1 tablet (0.125 mg total) under the tongue every 6 (six) hours as needed for cramping. 04/28/16  Yes Nelida Meuse III, MD  losartan (COZAAR) 50 MG tablet Take 50 mg by mouth daily.   Yes Historical Provider, MD  NON FORMULARY Nugenix Testosterone Booster / [place]   Yes Historical Provider, MD  omeprazole (PRILOSEC) 20 MG capsule Take 1 capsule by mouth  twice daily 06/04/16  Yes Chipper Herb, MD  Probiotic Product (ACIDOPHILUS/GOAT MILK) CAPS Take 1 capsule by mouth daily.   Yes Historical Provider,  MD  Probiotic Product (PROBIOTIC PO) Take by mouth.   Yes Historical Provider, MD  SYNTHROID 175 MCG tablet Take 1 tablet by mouth  daily before breakfast 05/06/16  Yes Chipper Herb, MD  UNABLE TO FIND Med Name: CBD oil   Yes Historical Provider, MD  AMBULATORY NON FORMULARY MEDICATION Nitroglycerine ointment 0.125 %  Apply a pea sized amount internally four times daily. Dispense 30 GM zero refill Patient not taking: Reported on 08/10/2016 06/22/16   Nelida Meuse III, MD    ROS:  Out of a complete 14 system review of symptoms, the patient complains only of the following symptoms, and all other reviewed systems are negative.  Decreased activity Ringing in the ears joint pain, back pain, neck pain Headache   Height 5\' 11"  (1.803 m), weight 190 lb 8 oz (86.4 kg).  Physical Exam  General: The patient is alert and cooperative at the time of the examination.  Neuromuscular: The patient lacks about 10 of full lateral rotation of the cervical spine bilaterally.  Skin: No significant peripheral edema is noted.   Neurologic Exam  Mental status: The patient is alert and oriented x 3 at the time of the examination. The patient has apparent normal recent and remote memory, with an apparently normal attention span and concentration ability.   Cranial nerves: Facial symmetry is present. Speech is normal, no aphasia or dysarthria is noted. Extraocular movements are full. Visual fields are full.  Motor: The patient has good strength in all 4 extremities.  Sensory examination: Soft touch sensation is symmetric on the face, arms, and legs.  Coordination: The patient has good finger-nose-finger and heel-to-shin bilaterally.  Gait and station: The patient has a normal gait. Tandem gait is normal. Romberg is negative. No drift is seen.  Reflexes: Deep tendon reflexes are symmetric.   Assessment/Plan:  1. Cervical spondylosis, cervical cord syrinx   2. Cervicogenic headache   The  last MRI of the cervical spine was done in 2014, we will recheck in about 6 months. The patient otherwise is getting no medications through this office, he will follow-up if needed.    Jill Alexanders MD 08/10/2016 10:28 AM  [facility] [address] [place], [place] [address]  Phone [phone] Fax [phone]

## 2016-08-18 ENCOUNTER — Ambulatory Visit (INDEPENDENT_AMBULATORY_CARE_PROVIDER_SITE_OTHER): Payer: 59

## 2016-08-18 DIAGNOSIS — Z23 Encounter for immunization: Secondary | ICD-10-CM | POA: Diagnosis not present

## 2016-08-30 ENCOUNTER — Encounter: Payer: Self-pay | Admitting: Family Medicine

## 2016-08-30 ENCOUNTER — Ambulatory Visit (INDEPENDENT_AMBULATORY_CARE_PROVIDER_SITE_OTHER): Payer: Medicare Other | Admitting: Family Medicine

## 2016-08-30 VITALS — BP 133/84 | HR 66 | Temp 96.9°F | Ht 71.0 in | Wt 194.6 lb

## 2016-08-30 DIAGNOSIS — I10 Essential (primary) hypertension: Secondary | ICD-10-CM

## 2016-08-30 DIAGNOSIS — R3911 Hesitancy of micturition: Secondary | ICD-10-CM

## 2016-08-30 DIAGNOSIS — E785 Hyperlipidemia, unspecified: Secondary | ICD-10-CM

## 2016-08-30 DIAGNOSIS — N401 Enlarged prostate with lower urinary tract symptoms: Secondary | ICD-10-CM | POA: Diagnosis not present

## 2016-08-30 DIAGNOSIS — K5731 Diverticulosis of large intestine without perforation or abscess with bleeding: Secondary | ICD-10-CM

## 2016-08-30 NOTE — Patient Instructions (Signed)
Great to meet you!  Your labs will be set up for you to come in fasting when you are ready

## 2016-08-30 NOTE — Progress Notes (Signed)
   HPI  Patient presents today here for routine checkup of chronic medical conditions.  Hypertension Good medication compliance, no chest pain, dyspnea, palpitations, leg edema.  Diverticulosis Rectal bleeding turns out to be due to hemorrhoids which were banded earlier this year. No rectal bleeding, left lower quadrant mild discomfort with eating certain foods, responds moderately to hyoscyamine. No fever, chills, sweats.  Benign prostatic hyperplasia Has been given Rapaflo by urology, has not tried the medication Reports some urinary hesitancy   PMH: Smoking status noted ROS: Per HPI  Objective: BP 133/84   Pulse 66   Temp (!) 96.9 F (36.1 C) (Oral)   Ht '5\' 11"'$  (1.803 m)   Wt 194 lb 9.6 oz (88.3 kg)   BMI 27.14 kg/m  Gen: NAD, alert, cooperative with exam HEENT: NCAT, EOMI, PERRL, TMs normal bilaterally, oropharynx clear, nares clear CV: RRR, good S1/S2, no murmur Resp: CTABL, no wheezes, non-labored Abd: SNTND, BS present, no guarding or organomegaly Ext: No edema, warm Neuro: Alert and oriented, 2+ symmetric patellar tendon reflexes, normal gait, strength 5/5 and sensation intact in bilateral lower extremities  Assessment and plan:  # Hypertension Well-controlled on amlodipine and losartan, continue Labs, fasting  # Diverticulosis Left lower quadrant discomfort intermittent for about one month, no fevers, chills, sweats. Responding moderately to hyoscyamine, continue No concern for acute diverticulitis at this time  # Hyperlipidemia Repeat labs  # BPH Encouraged to try Rapaflo, PSA including labs.     Orders Placed This Encounter  Procedures  . Lipid panel    Standing Status:   Future    Standing Expiration Date:   08/30/2017  . CMP14+EGFR    Standing Status:   Future    Standing Expiration Date:   08/30/2017  . CBC with Differential/Platelet    Standing Status:   Future    Standing Expiration Date:   08/30/2017  . PSA    Standing Status:    Future    Standing Expiration Date:   08/30/2017    Meds ordered this encounter  Medications  . Alpha-Lipoic Acid 600 MG CAPS    Sig: Take by mouth.    Laroy Apple, MD [facility] 08/30/2016, 10:21 AM

## 2016-08-31 DIAGNOSIS — L80 Vitiligo: Secondary | ICD-10-CM | POA: Diagnosis not present

## 2016-09-01 ENCOUNTER — Other Ambulatory Visit: Payer: Medicare Other

## 2016-09-01 DIAGNOSIS — N401 Enlarged prostate with lower urinary tract symptoms: Secondary | ICD-10-CM | POA: Diagnosis not present

## 2016-09-01 DIAGNOSIS — I10 Essential (primary) hypertension: Secondary | ICD-10-CM

## 2016-09-01 DIAGNOSIS — E785 Hyperlipidemia, unspecified: Secondary | ICD-10-CM

## 2016-09-01 DIAGNOSIS — R300 Dysuria: Secondary | ICD-10-CM | POA: Diagnosis not present

## 2016-09-01 DIAGNOSIS — R739 Hyperglycemia, unspecified: Secondary | ICD-10-CM | POA: Diagnosis not present

## 2016-09-01 DIAGNOSIS — R3911 Hesitancy of micturition: Secondary | ICD-10-CM | POA: Diagnosis not present

## 2016-09-01 LAB — URINALYSIS, COMPLETE
Bilirubin, UA: NEGATIVE
Glucose, UA: NEGATIVE
KETONES UA: NEGATIVE
LEUKOCYTES UA: NEGATIVE
Nitrite, UA: NEGATIVE
Protein, UA: NEGATIVE
RBC, UA: NEGATIVE
SPEC GRAV UA: 1.015 (ref 1.005–1.030)
Urobilinogen, Ur: 0.2 mg/dL (ref 0.2–1.0)
pH, UA: 7 (ref 5.0–7.5)

## 2016-09-01 LAB — MICROSCOPIC EXAMINATION
BACTERIA UA: NONE SEEN
EPITHELIAL CELLS (NON RENAL): NONE SEEN /HPF (ref 0–10)
RBC, UA: NONE SEEN /hpf (ref 0–?)
Renal Epithel, UA: NONE SEEN /hpf
WBC, UA: NONE SEEN /hpf (ref 0–?)

## 2016-09-02 DIAGNOSIS — L80 Vitiligo: Secondary | ICD-10-CM | POA: Diagnosis not present

## 2016-09-02 LAB — CBC WITH DIFFERENTIAL/PLATELET
BASOS ABS: 0 10*3/uL (ref 0.0–0.2)
BASOS: 1 %
EOS (ABSOLUTE): 0.1 10*3/uL (ref 0.0–0.4)
Eos: 3 %
HEMOGLOBIN: 17.4 g/dL (ref 13.0–17.7)
Hematocrit: 50.2 % (ref 37.5–51.0)
Immature Grans (Abs): 0 10*3/uL (ref 0.0–0.1)
Immature Granulocytes: 0 %
LYMPHS ABS: 1.4 10*3/uL (ref 0.7–3.1)
Lymphs: 27 %
MCH: 33.3 pg — AB (ref 26.6–33.0)
MCHC: 34.7 g/dL (ref 31.5–35.7)
MCV: 96 fL (ref 79–97)
MONOCYTES: 11 %
Monocytes Absolute: 0.6 10*3/uL (ref 0.1–0.9)
NEUTROS ABS: 3 10*3/uL (ref 1.4–7.0)
Neutrophils: 58 %
Platelets: 231 10*3/uL (ref 150–379)
RBC: 5.23 x10E6/uL (ref 4.14–5.80)
RDW: 13.1 % (ref 12.3–15.4)
WBC: 5.2 10*3/uL (ref 3.4–10.8)

## 2016-09-02 LAB — LIPID PANEL
CHOLESTEROL TOTAL: 168 mg/dL (ref 100–199)
Chol/HDL Ratio: 3.9 ratio units (ref 0.0–5.0)
HDL: 43 mg/dL (ref >39)
LDL Calculated: 90 mg/dL (ref 0–99)
TRIGLYCERIDES: 174 mg/dL — AB (ref 0–149)
VLDL Cholesterol Cal: 35 mg/dL (ref 5–40)

## 2016-09-02 LAB — CMP14+EGFR
A/G RATIO: 2.1 (ref 1.2–2.2)
ALBUMIN: 4.4 g/dL (ref 3.6–4.8)
ALT: 19 IU/L (ref 0–44)
AST: 17 IU/L (ref 0–40)
Alkaline Phosphatase: 97 IU/L (ref 39–117)
BILIRUBIN TOTAL: 1.3 mg/dL — AB (ref 0.0–1.2)
BUN / CREAT RATIO: 11 (ref 10–24)
BUN: 13 mg/dL (ref 8–27)
CHLORIDE: 100 mmol/L (ref 96–106)
CO2: 27 mmol/L (ref 18–29)
Calcium: 9.4 mg/dL (ref 8.6–10.2)
Creatinine, Ser: 1.21 mg/dL (ref 0.76–1.27)
GFR calc non Af Amer: 63 mL/min/{1.73_m2} (ref >59)
GFR, EST AFRICAN AMERICAN: 73 mL/min/{1.73_m2} (ref >59)
GLOBULIN, TOTAL: 2.1 g/dL (ref 1.5–4.5)
Glucose: 115 mg/dL — ABNORMAL HIGH (ref 65–99)
POTASSIUM: 4.5 mmol/L (ref 3.5–5.2)
Sodium: 141 mmol/L (ref 134–144)
TOTAL PROTEIN: 6.5 g/dL (ref 6.0–8.5)

## 2016-09-02 LAB — PSA: PROSTATE SPECIFIC AG, SERUM: 0.2 ng/mL (ref 0.0–4.0)

## 2016-09-04 LAB — HGB A1C W/O EAG: HEMOGLOBIN A1C: 5.6 % (ref 4.8–5.6)

## 2016-09-04 LAB — SPECIMEN STATUS REPORT

## 2016-09-07 DIAGNOSIS — L80 Vitiligo: Secondary | ICD-10-CM | POA: Diagnosis not present

## 2016-09-09 ENCOUNTER — Telehealth: Payer: Self-pay | Admitting: Family Medicine

## 2016-09-09 DIAGNOSIS — L80 Vitiligo: Secondary | ICD-10-CM | POA: Diagnosis not present

## 2016-09-09 NOTE — Telephone Encounter (Signed)
No samples- Patient aware.  

## 2016-09-14 DIAGNOSIS — L80 Vitiligo: Secondary | ICD-10-CM | POA: Diagnosis not present

## 2016-09-16 DIAGNOSIS — L80 Vitiligo: Secondary | ICD-10-CM | POA: Diagnosis not present

## 2016-09-24 DIAGNOSIS — L80 Vitiligo: Secondary | ICD-10-CM | POA: Diagnosis not present

## 2016-09-27 HISTORY — PX: HERNIA REPAIR: SHX51

## 2016-10-08 ENCOUNTER — Other Ambulatory Visit: Payer: Self-pay | Admitting: Cardiology

## 2016-10-11 DIAGNOSIS — N486 Induration penis plastica: Secondary | ICD-10-CM | POA: Diagnosis not present

## 2016-10-15 DIAGNOSIS — G588 Other specified mononeuropathies: Secondary | ICD-10-CM | POA: Diagnosis not present

## 2016-10-15 DIAGNOSIS — G894 Chronic pain syndrome: Secondary | ICD-10-CM | POA: Diagnosis not present

## 2016-10-15 DIAGNOSIS — Z79899 Other long term (current) drug therapy: Secondary | ICD-10-CM | POA: Diagnosis not present

## 2016-10-15 DIAGNOSIS — G950 Syringomyelia and syringobulbia: Secondary | ICD-10-CM | POA: Diagnosis not present

## 2016-10-15 DIAGNOSIS — M546 Pain in thoracic spine: Secondary | ICD-10-CM | POA: Diagnosis not present

## 2016-10-15 DIAGNOSIS — Z5181 Encounter for therapeutic drug level monitoring: Secondary | ICD-10-CM | POA: Diagnosis not present

## 2016-10-18 DIAGNOSIS — R102 Pelvic and perineal pain: Secondary | ICD-10-CM | POA: Diagnosis not present

## 2016-10-18 DIAGNOSIS — M6281 Muscle weakness (generalized): Secondary | ICD-10-CM | POA: Diagnosis not present

## 2016-10-18 DIAGNOSIS — N50819 Testicular pain, unspecified: Secondary | ICD-10-CM | POA: Diagnosis not present

## 2016-10-18 DIAGNOSIS — M62838 Other muscle spasm: Secondary | ICD-10-CM | POA: Diagnosis not present

## 2016-10-20 DIAGNOSIS — G8929 Other chronic pain: Secondary | ICD-10-CM | POA: Insufficient documentation

## 2016-10-26 DIAGNOSIS — L738 Other specified follicular disorders: Secondary | ICD-10-CM | POA: Diagnosis not present

## 2016-10-26 DIAGNOSIS — L80 Vitiligo: Secondary | ICD-10-CM | POA: Diagnosis not present

## 2016-10-26 DIAGNOSIS — L700 Acne vulgaris: Secondary | ICD-10-CM | POA: Diagnosis not present

## 2016-11-01 DIAGNOSIS — N50819 Testicular pain, unspecified: Secondary | ICD-10-CM | POA: Diagnosis not present

## 2016-11-01 DIAGNOSIS — R102 Pelvic and perineal pain: Secondary | ICD-10-CM | POA: Diagnosis not present

## 2016-11-01 DIAGNOSIS — R350 Frequency of micturition: Secondary | ICD-10-CM | POA: Diagnosis not present

## 2016-11-01 DIAGNOSIS — M6281 Muscle weakness (generalized): Secondary | ICD-10-CM | POA: Diagnosis not present

## 2016-11-01 DIAGNOSIS — M62838 Other muscle spasm: Secondary | ICD-10-CM | POA: Diagnosis not present

## 2016-11-02 DIAGNOSIS — L80 Vitiligo: Secondary | ICD-10-CM | POA: Diagnosis not present

## 2016-11-04 DIAGNOSIS — L80 Vitiligo: Secondary | ICD-10-CM | POA: Diagnosis not present

## 2016-11-09 DIAGNOSIS — R350 Frequency of micturition: Secondary | ICD-10-CM | POA: Diagnosis not present

## 2016-11-09 DIAGNOSIS — M62838 Other muscle spasm: Secondary | ICD-10-CM | POA: Diagnosis not present

## 2016-11-09 DIAGNOSIS — M6281 Muscle weakness (generalized): Secondary | ICD-10-CM | POA: Diagnosis not present

## 2016-11-09 DIAGNOSIS — N50819 Testicular pain, unspecified: Secondary | ICD-10-CM | POA: Diagnosis not present

## 2016-11-09 DIAGNOSIS — L80 Vitiligo: Secondary | ICD-10-CM | POA: Diagnosis not present

## 2016-11-09 DIAGNOSIS — R102 Pelvic and perineal pain: Secondary | ICD-10-CM | POA: Diagnosis not present

## 2016-11-11 DIAGNOSIS — L80 Vitiligo: Secondary | ICD-10-CM | POA: Diagnosis not present

## 2016-11-17 DIAGNOSIS — L80 Vitiligo: Secondary | ICD-10-CM | POA: Diagnosis not present

## 2016-11-17 DIAGNOSIS — R102 Pelvic and perineal pain: Secondary | ICD-10-CM | POA: Diagnosis not present

## 2016-11-17 DIAGNOSIS — M6281 Muscle weakness (generalized): Secondary | ICD-10-CM | POA: Diagnosis not present

## 2016-11-17 DIAGNOSIS — M62838 Other muscle spasm: Secondary | ICD-10-CM | POA: Diagnosis not present

## 2016-11-17 DIAGNOSIS — R350 Frequency of micturition: Secondary | ICD-10-CM | POA: Diagnosis not present

## 2016-11-18 ENCOUNTER — Other Ambulatory Visit: Payer: Self-pay | Admitting: Family Medicine

## 2016-11-18 ENCOUNTER — Other Ambulatory Visit: Payer: Self-pay | Admitting: Cardiology

## 2016-11-18 NOTE — Telephone Encounter (Signed)
Rx(s) sent to pharmacy electronically.  

## 2016-11-19 DIAGNOSIS — L80 Vitiligo: Secondary | ICD-10-CM | POA: Diagnosis not present

## 2016-11-22 DIAGNOSIS — L80 Vitiligo: Secondary | ICD-10-CM | POA: Diagnosis not present

## 2016-11-23 DIAGNOSIS — M6281 Muscle weakness (generalized): Secondary | ICD-10-CM | POA: Diagnosis not present

## 2016-11-23 DIAGNOSIS — R102 Pelvic and perineal pain: Secondary | ICD-10-CM | POA: Diagnosis not present

## 2016-11-23 DIAGNOSIS — N50819 Testicular pain, unspecified: Secondary | ICD-10-CM | POA: Diagnosis not present

## 2016-11-23 DIAGNOSIS — M62838 Other muscle spasm: Secondary | ICD-10-CM | POA: Diagnosis not present

## 2016-11-24 DIAGNOSIS — L80 Vitiligo: Secondary | ICD-10-CM | POA: Diagnosis not present

## 2016-11-26 DIAGNOSIS — G894 Chronic pain syndrome: Secondary | ICD-10-CM | POA: Diagnosis not present

## 2016-11-26 DIAGNOSIS — G588 Other specified mononeuropathies: Secondary | ICD-10-CM | POA: Diagnosis not present

## 2016-11-26 DIAGNOSIS — G950 Syringomyelia and syringobulbia: Secondary | ICD-10-CM | POA: Diagnosis not present

## 2016-11-26 DIAGNOSIS — M546 Pain in thoracic spine: Secondary | ICD-10-CM | POA: Diagnosis not present

## 2016-11-29 DIAGNOSIS — L80 Vitiligo: Secondary | ICD-10-CM | POA: Diagnosis not present

## 2016-12-01 DIAGNOSIS — R102 Pelvic and perineal pain: Secondary | ICD-10-CM | POA: Diagnosis not present

## 2016-12-01 DIAGNOSIS — M6281 Muscle weakness (generalized): Secondary | ICD-10-CM | POA: Diagnosis not present

## 2016-12-01 DIAGNOSIS — M62838 Other muscle spasm: Secondary | ICD-10-CM | POA: Diagnosis not present

## 2016-12-01 DIAGNOSIS — L80 Vitiligo: Secondary | ICD-10-CM | POA: Diagnosis not present

## 2016-12-01 DIAGNOSIS — R350 Frequency of micturition: Secondary | ICD-10-CM | POA: Diagnosis not present

## 2016-12-01 DIAGNOSIS — N50819 Testicular pain, unspecified: Secondary | ICD-10-CM | POA: Diagnosis not present

## 2016-12-08 DIAGNOSIS — M62838 Other muscle spasm: Secondary | ICD-10-CM | POA: Diagnosis not present

## 2016-12-08 DIAGNOSIS — R102 Pelvic and perineal pain: Secondary | ICD-10-CM | POA: Diagnosis not present

## 2016-12-08 DIAGNOSIS — M6281 Muscle weakness (generalized): Secondary | ICD-10-CM | POA: Diagnosis not present

## 2016-12-08 DIAGNOSIS — R350 Frequency of micturition: Secondary | ICD-10-CM | POA: Diagnosis not present

## 2016-12-08 DIAGNOSIS — L400 Psoriasis vulgaris: Secondary | ICD-10-CM | POA: Diagnosis not present

## 2016-12-08 DIAGNOSIS — N50819 Testicular pain, unspecified: Secondary | ICD-10-CM | POA: Diagnosis not present

## 2016-12-10 DIAGNOSIS — L80 Vitiligo: Secondary | ICD-10-CM | POA: Diagnosis not present

## 2016-12-13 DIAGNOSIS — L80 Vitiligo: Secondary | ICD-10-CM | POA: Diagnosis not present

## 2016-12-15 DIAGNOSIS — M62838 Other muscle spasm: Secondary | ICD-10-CM | POA: Diagnosis not present

## 2016-12-15 DIAGNOSIS — R102 Pelvic and perineal pain: Secondary | ICD-10-CM | POA: Diagnosis not present

## 2016-12-15 DIAGNOSIS — N50819 Testicular pain, unspecified: Secondary | ICD-10-CM | POA: Diagnosis not present

## 2016-12-15 DIAGNOSIS — R350 Frequency of micturition: Secondary | ICD-10-CM | POA: Diagnosis not present

## 2016-12-15 DIAGNOSIS — L80 Vitiligo: Secondary | ICD-10-CM | POA: Diagnosis not present

## 2016-12-15 DIAGNOSIS — M6281 Muscle weakness (generalized): Secondary | ICD-10-CM | POA: Diagnosis not present

## 2016-12-17 DIAGNOSIS — Z7982 Long term (current) use of aspirin: Secondary | ICD-10-CM | POA: Diagnosis not present

## 2016-12-17 DIAGNOSIS — M47814 Spondylosis without myelopathy or radiculopathy, thoracic region: Secondary | ICD-10-CM | POA: Diagnosis not present

## 2016-12-17 DIAGNOSIS — G580 Intercostal neuropathy: Secondary | ICD-10-CM | POA: Diagnosis not present

## 2016-12-17 DIAGNOSIS — Z8673 Personal history of transient ischemic attack (TIA), and cerebral infarction without residual deficits: Secondary | ICD-10-CM | POA: Diagnosis not present

## 2016-12-17 DIAGNOSIS — E039 Hypothyroidism, unspecified: Secondary | ICD-10-CM | POA: Diagnosis not present

## 2016-12-17 DIAGNOSIS — I10 Essential (primary) hypertension: Secondary | ICD-10-CM | POA: Diagnosis not present

## 2016-12-17 DIAGNOSIS — Z79899 Other long term (current) drug therapy: Secondary | ICD-10-CM | POA: Diagnosis not present

## 2016-12-17 DIAGNOSIS — Z888 Allergy status to other drugs, medicaments and biological substances status: Secondary | ICD-10-CM | POA: Diagnosis not present

## 2016-12-22 DIAGNOSIS — L80 Vitiligo: Secondary | ICD-10-CM | POA: Diagnosis not present

## 2016-12-24 DIAGNOSIS — L80 Vitiligo: Secondary | ICD-10-CM | POA: Diagnosis not present

## 2016-12-27 DIAGNOSIS — R102 Pelvic and perineal pain: Secondary | ICD-10-CM | POA: Diagnosis not present

## 2016-12-27 DIAGNOSIS — N50819 Testicular pain, unspecified: Secondary | ICD-10-CM | POA: Diagnosis not present

## 2016-12-27 DIAGNOSIS — M6281 Muscle weakness (generalized): Secondary | ICD-10-CM | POA: Diagnosis not present

## 2016-12-27 DIAGNOSIS — M62838 Other muscle spasm: Secondary | ICD-10-CM | POA: Diagnosis not present

## 2016-12-27 DIAGNOSIS — L80 Vitiligo: Secondary | ICD-10-CM | POA: Diagnosis not present

## 2016-12-29 DIAGNOSIS — L80 Vitiligo: Secondary | ICD-10-CM | POA: Diagnosis not present

## 2017-01-03 DIAGNOSIS — L80 Vitiligo: Secondary | ICD-10-CM | POA: Diagnosis not present

## 2017-01-05 DIAGNOSIS — L80 Vitiligo: Secondary | ICD-10-CM | POA: Diagnosis not present

## 2017-01-10 DIAGNOSIS — L80 Vitiligo: Secondary | ICD-10-CM | POA: Diagnosis not present

## 2017-01-12 DIAGNOSIS — L80 Vitiligo: Secondary | ICD-10-CM | POA: Diagnosis not present

## 2017-01-12 DIAGNOSIS — R350 Frequency of micturition: Secondary | ICD-10-CM | POA: Diagnosis not present

## 2017-01-12 DIAGNOSIS — N401 Enlarged prostate with lower urinary tract symptoms: Secondary | ICD-10-CM | POA: Diagnosis not present

## 2017-01-12 DIAGNOSIS — N5201 Erectile dysfunction due to arterial insufficiency: Secondary | ICD-10-CM | POA: Diagnosis not present

## 2017-01-12 DIAGNOSIS — N486 Induration penis plastica: Secondary | ICD-10-CM | POA: Diagnosis not present

## 2017-01-12 DIAGNOSIS — R102 Pelvic and perineal pain: Secondary | ICD-10-CM | POA: Diagnosis not present

## 2017-01-13 DIAGNOSIS — M62838 Other muscle spasm: Secondary | ICD-10-CM | POA: Diagnosis not present

## 2017-01-13 DIAGNOSIS — M546 Pain in thoracic spine: Secondary | ICD-10-CM | POA: Diagnosis not present

## 2017-01-13 DIAGNOSIS — G588 Other specified mononeuropathies: Secondary | ICD-10-CM | POA: Diagnosis not present

## 2017-01-13 DIAGNOSIS — M6281 Muscle weakness (generalized): Secondary | ICD-10-CM | POA: Diagnosis not present

## 2017-01-13 DIAGNOSIS — R102 Pelvic and perineal pain: Secondary | ICD-10-CM | POA: Diagnosis not present

## 2017-01-13 DIAGNOSIS — G950 Syringomyelia and syringobulbia: Secondary | ICD-10-CM | POA: Diagnosis not present

## 2017-01-13 DIAGNOSIS — G894 Chronic pain syndrome: Secondary | ICD-10-CM | POA: Diagnosis not present

## 2017-01-13 DIAGNOSIS — N50819 Testicular pain, unspecified: Secondary | ICD-10-CM | POA: Diagnosis not present

## 2017-01-17 DIAGNOSIS — L80 Vitiligo: Secondary | ICD-10-CM | POA: Diagnosis not present

## 2017-01-19 DIAGNOSIS — L80 Vitiligo: Secondary | ICD-10-CM | POA: Diagnosis not present

## 2017-01-24 DIAGNOSIS — R102 Pelvic and perineal pain: Secondary | ICD-10-CM | POA: Diagnosis not present

## 2017-01-24 DIAGNOSIS — M6281 Muscle weakness (generalized): Secondary | ICD-10-CM | POA: Diagnosis not present

## 2017-01-24 DIAGNOSIS — M62838 Other muscle spasm: Secondary | ICD-10-CM | POA: Diagnosis not present

## 2017-01-24 DIAGNOSIS — N50819 Testicular pain, unspecified: Secondary | ICD-10-CM | POA: Diagnosis not present

## 2017-01-24 DIAGNOSIS — R350 Frequency of micturition: Secondary | ICD-10-CM | POA: Diagnosis not present

## 2017-01-24 DIAGNOSIS — L80 Vitiligo: Secondary | ICD-10-CM | POA: Diagnosis not present

## 2017-01-26 DIAGNOSIS — L80 Vitiligo: Secondary | ICD-10-CM | POA: Diagnosis not present

## 2017-01-28 DIAGNOSIS — M542 Cervicalgia: Secondary | ICD-10-CM | POA: Diagnosis not present

## 2017-01-31 DIAGNOSIS — N50819 Testicular pain, unspecified: Secondary | ICD-10-CM | POA: Diagnosis not present

## 2017-01-31 DIAGNOSIS — M6281 Muscle weakness (generalized): Secondary | ICD-10-CM | POA: Diagnosis not present

## 2017-01-31 DIAGNOSIS — M62838 Other muscle spasm: Secondary | ICD-10-CM | POA: Diagnosis not present

## 2017-01-31 DIAGNOSIS — R152 Fecal urgency: Secondary | ICD-10-CM | POA: Diagnosis not present

## 2017-01-31 DIAGNOSIS — L80 Vitiligo: Secondary | ICD-10-CM | POA: Diagnosis not present

## 2017-01-31 DIAGNOSIS — R102 Pelvic and perineal pain: Secondary | ICD-10-CM | POA: Diagnosis not present

## 2017-02-02 DIAGNOSIS — L80 Vitiligo: Secondary | ICD-10-CM | POA: Diagnosis not present

## 2017-02-03 ENCOUNTER — Telehealth: Payer: Self-pay | Admitting: Neurology

## 2017-02-03 DIAGNOSIS — G950 Syringomyelia and syringobulbia: Secondary | ICD-10-CM

## 2017-02-03 NOTE — Telephone Encounter (Signed)
I called the patient. He has had a spinal cord syrinx in the cervical and thoracic area, we will schedule a follow-up evaluation. The study done in 2014 suggested an enlargement in the syrinx as compared to a prior study done in 2011.  The patient is amenable to having this study done.

## 2017-02-07 DIAGNOSIS — L80 Vitiligo: Secondary | ICD-10-CM | POA: Diagnosis not present

## 2017-02-09 DIAGNOSIS — L80 Vitiligo: Secondary | ICD-10-CM | POA: Diagnosis not present

## 2017-02-14 DIAGNOSIS — M6281 Muscle weakness (generalized): Secondary | ICD-10-CM | POA: Diagnosis not present

## 2017-02-14 DIAGNOSIS — N50819 Testicular pain, unspecified: Secondary | ICD-10-CM | POA: Diagnosis not present

## 2017-02-14 DIAGNOSIS — R102 Pelvic and perineal pain: Secondary | ICD-10-CM | POA: Diagnosis not present

## 2017-02-14 DIAGNOSIS — M62838 Other muscle spasm: Secondary | ICD-10-CM | POA: Diagnosis not present

## 2017-02-14 DIAGNOSIS — R152 Fecal urgency: Secondary | ICD-10-CM | POA: Diagnosis not present

## 2017-02-14 DIAGNOSIS — R350 Frequency of micturition: Secondary | ICD-10-CM | POA: Diagnosis not present

## 2017-02-16 DIAGNOSIS — L80 Vitiligo: Secondary | ICD-10-CM | POA: Diagnosis not present

## 2017-02-22 DIAGNOSIS — M62838 Other muscle spasm: Secondary | ICD-10-CM | POA: Diagnosis not present

## 2017-02-22 DIAGNOSIS — R102 Pelvic and perineal pain: Secondary | ICD-10-CM | POA: Diagnosis not present

## 2017-02-22 DIAGNOSIS — N50819 Testicular pain, unspecified: Secondary | ICD-10-CM | POA: Diagnosis not present

## 2017-02-22 DIAGNOSIS — R152 Fecal urgency: Secondary | ICD-10-CM | POA: Diagnosis not present

## 2017-02-22 DIAGNOSIS — M6281 Muscle weakness (generalized): Secondary | ICD-10-CM | POA: Diagnosis not present

## 2017-02-23 DIAGNOSIS — L80 Vitiligo: Secondary | ICD-10-CM | POA: Diagnosis not present

## 2017-02-28 ENCOUNTER — Ambulatory Visit
Admission: RE | Admit: 2017-02-28 | Discharge: 2017-02-28 | Disposition: A | Discharge status: Home / Self Care | Payer: Medicare Other | Source: Ambulatory Visit | Attending: Neurology | Admitting: Neurology

## 2017-02-28 DIAGNOSIS — M5124 Other intervertebral disc displacement, thoracic region: Secondary | ICD-10-CM | POA: Diagnosis not present

## 2017-02-28 DIAGNOSIS — M50222 Other cervical disc displacement at C5-C6 level: Secondary | ICD-10-CM | POA: Diagnosis not present

## 2017-02-28 DIAGNOSIS — L80 Vitiligo: Secondary | ICD-10-CM | POA: Diagnosis not present

## 2017-02-28 DIAGNOSIS — G950 Syringomyelia and syringobulbia: Secondary | ICD-10-CM

## 2017-02-28 DIAGNOSIS — M50221 Other cervical disc displacement at C4-C5 level: Secondary | ICD-10-CM | POA: Diagnosis not present

## 2017-03-01 ENCOUNTER — Telehealth: Payer: Self-pay | Admitting: Neurology

## 2017-03-01 MED ORDER — DULOXETINE HCL 30 MG PO CPEP: 30.0000 mg | ORAL_CAPSULE | Freq: Every day | ORAL | 3 refills | Status: DC

## 2017-03-01 NOTE — Telephone Encounter (Signed)
I called patient. The patient continues to have pain in the right shoulder blade and arm, burning discomfort in the rib cage on the right. Some this may be related to the syrinx, the patient has possible impingement of the right C5 nerve root. He uses a TENS unit, in the past amitriptyline, Lyrica, and gabapentin have not been well tolerated. We will try low-dose Cymbalta to see if this helps.  The patient has significant discomfort with the right arm and has difficulty using it. He has had some nerve ablation procedures around the shoulder blade with some benefit.  I will call in a prescription for the Cymbalta, he will call me if he believes that he can tolerate the drug and wishes to go up on the dose.   MRI cervical 02/28/17:  IMPRESSION:  This MRI of the cervical spine shows the following: 1.    There is a syrinx extending from C5-C7 with a maximal diameter of 3 mm that is unchanged when compared to the 12/26/2015 MRI. 2.    At C4-C5 there is a right disc osteophyte complex causing moderate right foraminal narrowing that could lead to right C5 nerve root compression.  This appears stable when compared to 12/26/2015 MRI. 3.    There are milder degenerative changes at C3-C4, C5-C6 and C6-C7 that did not lead to significant foraminal narrowing or nerve root compression.   MRI thoracic 02/28/17:  IMPRESSION:  This MRI of the thoracic spine without contrast shows the following: 1.    There is a small syrinx extending from T3-T4 to T6-T7 with a maximum diameter under 2 mm at its widest adjacent to T5-T6. It is unchanged when compared to the 12/26/2015 MRI. 2.    There is scoliosis convex to the right. 3.    There is multilevel degenerative changes as detailed above causing foraminal narrowing to the right in the upper thoracic spine and foraminal narrowing to the left in the lower thoracic spine.   The degenerative changes are unchanged when compared 12/26/2015 MRI.

## 2017-03-02 DIAGNOSIS — L80 Vitiligo: Secondary | ICD-10-CM | POA: Diagnosis not present

## 2017-03-07 DIAGNOSIS — L80 Vitiligo: Secondary | ICD-10-CM | POA: Diagnosis not present

## 2017-03-09 ENCOUNTER — Other Ambulatory Visit: Payer: Self-pay | Admitting: Neurology

## 2017-03-09 ENCOUNTER — Encounter: Payer: Self-pay | Admitting: Neurology

## 2017-03-09 DIAGNOSIS — L80 Vitiligo: Secondary | ICD-10-CM | POA: Diagnosis not present

## 2017-03-14 DIAGNOSIS — L80 Vitiligo: Secondary | ICD-10-CM | POA: Diagnosis not present

## 2017-03-16 DIAGNOSIS — L80 Vitiligo: Secondary | ICD-10-CM | POA: Diagnosis not present

## 2017-03-17 DIAGNOSIS — G894 Chronic pain syndrome: Secondary | ICD-10-CM | POA: Diagnosis not present

## 2017-03-17 DIAGNOSIS — M5030 Other cervical disc degeneration, unspecified cervical region: Secondary | ICD-10-CM | POA: Diagnosis not present

## 2017-03-17 DIAGNOSIS — G950 Syringomyelia and syringobulbia: Secondary | ICD-10-CM | POA: Diagnosis not present

## 2017-03-21 DIAGNOSIS — R152 Fecal urgency: Secondary | ICD-10-CM | POA: Diagnosis not present

## 2017-03-21 DIAGNOSIS — M6281 Muscle weakness (generalized): Secondary | ICD-10-CM | POA: Diagnosis not present

## 2017-03-21 DIAGNOSIS — M62838 Other muscle spasm: Secondary | ICD-10-CM | POA: Diagnosis not present

## 2017-03-21 DIAGNOSIS — R102 Pelvic and perineal pain: Secondary | ICD-10-CM | POA: Diagnosis not present

## 2017-03-21 DIAGNOSIS — N50819 Testicular pain, unspecified: Secondary | ICD-10-CM | POA: Diagnosis not present

## 2017-03-21 DIAGNOSIS — L80 Vitiligo: Secondary | ICD-10-CM | POA: Diagnosis not present

## 2017-03-23 DIAGNOSIS — L80 Vitiligo: Secondary | ICD-10-CM | POA: Diagnosis not present

## 2017-03-28 DIAGNOSIS — L80 Vitiligo: Secondary | ICD-10-CM | POA: Diagnosis not present

## 2017-03-31 DIAGNOSIS — L80 Vitiligo: Secondary | ICD-10-CM | POA: Diagnosis not present

## 2017-04-04 DIAGNOSIS — L738 Other specified follicular disorders: Secondary | ICD-10-CM | POA: Diagnosis not present

## 2017-04-04 DIAGNOSIS — L80 Vitiligo: Secondary | ICD-10-CM | POA: Diagnosis not present

## 2017-04-06 DIAGNOSIS — L80 Vitiligo: Secondary | ICD-10-CM | POA: Diagnosis not present

## 2017-04-07 ENCOUNTER — Other Ambulatory Visit: Payer: Self-pay | Admitting: Family Medicine

## 2017-04-11 DIAGNOSIS — L80 Vitiligo: Secondary | ICD-10-CM | POA: Diagnosis not present

## 2017-04-13 DIAGNOSIS — L80 Vitiligo: Secondary | ICD-10-CM | POA: Diagnosis not present

## 2017-04-18 DIAGNOSIS — L80 Vitiligo: Secondary | ICD-10-CM | POA: Diagnosis not present

## 2017-04-23 ENCOUNTER — Telehealth: Payer: Medicare Other | Admitting: Physician Assistant

## 2017-04-23 NOTE — Progress Notes (Signed)
You will need to have your wife submit an e-visit through her own MyChart account. We cannot and will not treat without the message coming through her account as it is linked to her medical record that we will need to review. If she does not have a MyChart there are sign up instructions on the home page -- [email or web address]. Otherwise, she will need to seek care at an Urgent Care or ER facility this weekend to get appropriate treatment and to prevent infection from moving to the kidney.  You will not be charged for this visit.

## 2017-05-17 NOTE — Progress Notes (Signed)
[facility] [address] [place], [place] [address] Phone: [phone] Subjective:    I'm seeing this patient by the request  of:    CC: Neck and back pain  NOI:[id number]  Jacob Kerr is a 66 y.o. male coming in with complaint of neck and back pain. Has had this now for multiple years. Patient unfortunately did have a cerebrovascular accident and since then has had chronic headaches pain. Has seen numerous different people over the course of the years. Patient has had numerous imaging. Most recently patient has not shown any significant progression but does have a syrinx in the cervical and thoracic areas. Continues to have pain and has failed multiple different medications as well as formal physical therapy. They'll discuss the possibility of surgery, spinal cord stimulator, or even potentially different injections with patient does not want to do. Patient has had difficulty with different medications including Cymbalta as well as gabapentin and Lyrica.   patient did previously have an MRI of the thoracic and cervical spine. These were dated on 02/28/2017. Independently visualized by me. Thoracic spine shows the patient does a small syrinx from T3-T7 no significant compression noted. No significant changes from previous MRI in 2017. Moderate degenerative disc disease at multiple levels. Cervical spine does show that patient does have significant right foraminal narrowing secondary to degenerative changes at C5 causing compression Of Has Mild Degenerative Changes.  Past Medical History:  Diagnosis Date  . Acute meniscal tear of left knee   . Allergic rhinitis   . BPH (benign prostatic hypertrophy)   . Cervical spondylosis without myelopathy   . Cervicogenic headache   . Chronic neck pain    uses TENS unit  prn  . Diverticulitis   . GERD (gastroesophageal reflux disease)   . H/O hiatal hernia   . History of diverticulitis   . History of kidney stones   .  History of TIA (transient ischemic attack)    04/ 2011---  no residuals  . Hypertension   . Hypothyroidism   . Peripheral neuropathy ([place])   . Shingles   . Syrinx of spinal cord ([place])    C4 -- C7   Past Surgical History:  Procedure Laterality Date  . CARDIAC CATHETERIZATION  02-18-2010   dr Angelena Form   normal coronary arteries/  normal lvsf--  ef 65%  . COLONOSCOPY    . [place]  . KNEE ARTHROSCOPY Left 03/13/2014   Procedure: LEFT ARTHROSCOPY KNEE WITH DEBRIDMENT;  Surgeon: Gearlean Alf, MD;  Location: [facility];  Service: Orthopedics;  Laterality: Left;  . LAPAROSCOPIC CHOLECYSTECTOMY  01-30-2003  . NEGATIVE SLEEP STUDY  2013   per pt  . RADIOFREQUENCY ABLATION NERVES  05/2016  . REMOVAL FORGEIN BODY FROM EAR  AGE 46  . RIGHT URETEROSCOPIC LASER LITHOTRIPSY STONE EXTRACTION /  STENT PLACEMENT  05-10-2000  . TRANSTHORACIC ECHOCARDIOGRAM  01-16-2010   normal lvf/  ef  60%/  mild lae   Social History   Social History  . Marital status: Married    Spouse name: N/A  . Number of children: 2  . Years of education: 12   Occupational History  . Retired Chief of Staff   Social History Main Topics  . Smoking status: Never Smoker  . Smokeless tobacco: Never Used  . Alcohol use 0.0 oz/week     Comment: occasionally  . Drug use: No  . Sexual activity: Not on file   Other Topics  Concern  . Not on file   Social History Narrative   Lives at home with wife.       Patient is right handed.   Patient drinks 1-2 cups of caffeine daily.   Allergies  Allergen Reactions  . Cymbalta [Duloxetine Hcl]     Depression  . Robaxin [Methocarbamol] Other (See Comments)    Seeing "bright lights"  . Sudafed [Pseudoephedrine Hcl] Other (See Comments)    Increased heart rate  . Epinephrine Palpitations   Family History  Problem Relation Age of Onset  . Prostate cancer Father   . Heart disease Father   . Stroke Father   . Nephrolithiasis Sister   .  Cancer Sister        Intestinal  . Hyperlipidemia Sister   . Hyperlipidemia Sister   . Cancer Sister        Sinus  . COPD Unknown   . Colon polyps Brother        x2  . Colon polyps Sister   . Colon cancer Neg Hx   . Esophageal cancer Neg Hx      Past medical history, social, surgical and family history all reviewed in electronic medical record.  No pertanent information unless stated regarding to the chief complaint.   Review of Systems:Review of systems updated and as accurate as of 05/17/17  No headache, visual changes, nausea, vomiting, diarrhea, constipation, dizziness, abdominal pain, skin rash, fevers, chills, night sweats, weight loss, swollen lymph nodes, body aches, joint swelling, muscle aches, chest pain, shortness of breath, mood changes.   Objective  There were no vitals taken for this visit. Systems examined below as of 05/17/17   General: No apparent distress alert and oriented x3 mood and affect normal, dressed appropriately.  HEENT: Pupils equal, extraocular movements intact  Respiratory: Patient's speak in full sentences and does not appear short of breath  Cardiovascular: No lower extremity edema, non tender, no erythema  Skin: Warm dry intact with no signs of infection or rash on extremities or on axial skeleton.  Abdomen: Soft nontender  Neuro: Cranial nerves II through XII are intact, neurovascularly intact in all extremities with 2+ DTRs and 2+ pulses.  Lymph: No lymphadenopathy of posterior or anterior cervical chain or axillae bilaterally.  Gait normal with good balance and coordination.  MSK:  Non tender with full range of motion and good stability and symmetric strength and tone of shoulders, elbows, wrist, hip, knee and ankles bilaterally.  Back Exam:  Inspection: Unremarkable  Motion: Flexion 40 deg worsening pain in the mid back, Extension 25 deg, Side Bending to 45 deg bilaterally,  Rotation to 45 deg bilaterally  SLR laying: Negative  XSLR  laying: Negative  Palpable tenderness: Pain more over the thoracolumbar juncture as well as at the inferior aspect of the scapulas. FABER: negative. Sensory change: Gross sensation intact to all lumbar and sacral dermatomes.  Reflexes: 2+ at both patellar tendons, 2+ at achilles tendons, Babinski's downgoing.  Strength at foot  Plantar-flexion: 5/5 Dorsi-flexion: 5/5 Eversion: 5/5 Inversion: 5/5  Leg strength  Quad: 5/5 Hamstring: 5/5 Hip flexor: 5/5 Hip abductors: 5/5  Gait unremarkable.  Neck: Inspection mild lordosis. No palpable stepoffs. Negative Spurling's maneuver. Lacks last 10 of extension Grip strength and sensation normal in bilateral hands Strength good C4 to T1 distribution No sensory change to C4 to T1 Negative Hoffman sign bilaterally Reflexes normal  Osteopathic findings C7 flexed rotated and side bent left T3 extended rotated and side bent right  T5 extended rotated and side bent left L2 flexed rotated and side bent right Sacrum right on right     Impression and Recommendations:     This case required medical decision making of moderate complexity.      Note: This dictation was prepared with Dragon dictation along with smaller phrase technology. Any transcriptional errors that result from this process are unintentional.

## 2017-05-18 ENCOUNTER — Other Ambulatory Visit: Payer: Medicare Other

## 2017-05-18 ENCOUNTER — Encounter: Payer: Self-pay | Admitting: Family Medicine

## 2017-05-18 ENCOUNTER — Ambulatory Visit (INDEPENDENT_AMBULATORY_CARE_PROVIDER_SITE_OTHER): Payer: Medicare Other | Admitting: Family Medicine

## 2017-05-18 ENCOUNTER — Other Ambulatory Visit (INDEPENDENT_AMBULATORY_CARE_PROVIDER_SITE_OTHER): Payer: Medicare Other

## 2017-05-18 VITALS — BP 136/84 | HR 82 | Ht 72.0 in | Wt 199.0 lb

## 2017-05-18 DIAGNOSIS — G950 Syringomyelia and syringobulbia: Secondary | ICD-10-CM

## 2017-05-18 DIAGNOSIS — M2550 Pain in unspecified joint: Secondary | ICD-10-CM

## 2017-05-18 DIAGNOSIS — R51 Headache: Secondary | ICD-10-CM

## 2017-05-18 DIAGNOSIS — M999 Biomechanical lesion, unspecified: Secondary | ICD-10-CM

## 2017-05-18 DIAGNOSIS — G4486 Cervicogenic headache: Secondary | ICD-10-CM

## 2017-05-18 LAB — IBC PANEL
Iron: 172 ug/dL — ABNORMAL HIGH (ref 42–165)
SATURATION RATIOS: 38.8 % (ref 20.0–50.0)
TRANSFERRIN: 317 mg/dL (ref 212.0–360.0)

## 2017-05-18 LAB — SEDIMENTATION RATE: SED RATE: 1 mm/h (ref 0–20)

## 2017-05-18 MED ORDER — VENLAFAXINE HCL ER 37.5 MG PO CP24: 37.5000 mg | ORAL_CAPSULE | Freq: Every day | ORAL | 1 refills | Status: DC

## 2017-05-18 NOTE — Assessment & Plan Note (Signed)
I do believe the patient does have more of a cervicogenic headache likely from patient's syrinx. Discussed with patient at great length. We discussed icing regimen and home exercises. We discussed different invasive procedures such as possible aspiration versus surgical intervention as well as spinal cord stimulator. Patient once to avoid any type of invasive management at this time. We discussed over-the-counter medications, we discussed other laboratory workup to see if anything else is contributing to some of his headaches. Patient will be started on Effexor at a low dose to see if this will help with some of the nerve pain as well as chronic headaches. We'll consider partial treatment for intracranial hypertension. Patient come back and see me again in 3-4 weeks.

## 2017-05-18 NOTE — Assessment & Plan Note (Addendum)
stated above

## 2017-05-18 NOTE — Assessment & Plan Note (Signed)
Decision today to treat with OMT was based on Physical Exam  After verbal consent patient was treated with  ME, FPR techniques in cervical, thoracic, lumbar and sacral areas discussed with him and great length that I do not know if this will make any significant benefit. Patient will also try it with the possibility of it being a low risk procedure.  Patient tolerated the procedure well with improvement in symptoms  Patient given exercises, stretches and lifestyle modifications  See medications in patient instructions if given  Patient will follow up in 4 weeks

## 2017-05-18 NOTE — Patient Instructions (Addendum)
Good to see you  This will be difficult we will give it our best  effexor 37.5 mg could help maybe with some of the nerve pain. Expect some increase energy and we will watch how you do  We will get labs and see what is going on.  Exercises 3 times a week.  Over the counter try  Vitamin D 2000 IU daily  Turmeric 500mg  daily  Tart cherry extract any dose at night See me again in 4 weeks.

## 2017-05-19 LAB — CALCIUM, IONIZED: Calcium, Ion: 5.2 mg/dL (ref 4.8–5.6)

## 2017-05-19 LAB — PTH, INTACT AND CALCIUM
Calcium: 9.1 mg/dL (ref 8.6–10.3)
PTH: 54 pg/mL (ref 14–64)

## 2017-05-19 LAB — VITAMIN D 25 HYDROXY (VIT D DEFICIENCY, FRACTURES): VITD: 36.74 ng/mL (ref 30.00–100.00)

## 2017-05-19 LAB — TSH: TSH: 0.57 u[IU]/mL (ref 0.35–4.50)

## 2017-05-19 LAB — VITAMIN B12: Vitamin B-12: 220 pg/mL (ref 211–911)

## 2017-05-25 ENCOUNTER — Encounter: Payer: Self-pay | Admitting: Family Medicine

## 2017-06-01 ENCOUNTER — Other Ambulatory Visit: Payer: Self-pay | Admitting: Family Medicine

## 2017-06-03 ENCOUNTER — Other Ambulatory Visit: Payer: Self-pay | Admitting: *Deleted

## 2017-06-03 MED ORDER — AMLODIPINE BESYLATE 10 MG PO TABS: 10.0000 mg | ORAL_TABLET | Freq: Every day | ORAL | 0 refills | Status: DC

## 2017-06-06 ENCOUNTER — Other Ambulatory Visit: Payer: Self-pay | Admitting: Family Medicine

## 2017-06-15 ENCOUNTER — Ambulatory Visit (INDEPENDENT_AMBULATORY_CARE_PROVIDER_SITE_OTHER): Payer: Medicare Other | Admitting: Family Medicine

## 2017-06-15 ENCOUNTER — Encounter: Payer: Self-pay | Admitting: Family Medicine

## 2017-06-15 VITALS — BP 132/88 | Ht 72.0 in | Wt 196.0 lb

## 2017-06-15 DIAGNOSIS — M999 Biomechanical lesion, unspecified: Secondary | ICD-10-CM | POA: Diagnosis not present

## 2017-06-15 DIAGNOSIS — G950 Syringomyelia and syringobulbia: Secondary | ICD-10-CM

## 2017-06-15 MED ORDER — ACETAZOLAMIDE 125 MG PO TABS: 125.0000 mg | ORAL_TABLET | Freq: Two times a day (BID) | ORAL | 1 refills | Status: DC

## 2017-06-15 NOTE — Progress Notes (Signed)
[name] [address] [place], [place] [address] Phone: [phone] Subjective:    I'm seeing this patient by the request  of:    CC: Headache upper back pain  [id number]  Jacob Kerr is a 66 y.o. male coming in for follow-up for back pain. He states that he is able to get one more hour of sleep than he usually has been. He does feel ok while doing work but becomes stiff when sedentary. He said that he has been using CBD oil which he feels may be helping. Would like to know if he should continue prescriptions that we gave him. Has a syrinx.         Past Medical History:  Diagnosis Date  . Acute meniscal tear of left knee   . Allergic rhinitis   . BPH (benign prostatic hypertrophy)   . Cervical spondylosis without myelopathy   . Cervicogenic headache   . Chronic neck pain    uses TENS unit  prn  . Diverticulitis   . GERD (gastroesophageal reflux disease)   . H/O hiatal hernia   . History of diverticulitis   . History of kidney stones   . History of TIA (transient ischemic attack)    04/ 2011---  no residuals  . Hypertension   . Hypothyroidism   . Peripheral neuropathy   . Shingles   . Syrinx of spinal cord ([place])    C4 -- C7   Past Surgical History:  Procedure Laterality Date  . CARDIAC CATHETERIZATION  02-18-2010   dr Angelena Form   normal coronary arteries/  normal lvsf--  ef 65%  . COLONOSCOPY    . [place]  . KNEE ARTHROSCOPY Left 03/13/2014   Procedure: LEFT ARTHROSCOPY KNEE WITH DEBRIDMENT;  Surgeon: Gearlean Alf, MD;  Location: [facility];  Service: Orthopedics;  Laterality: Left;  . LAPAROSCOPIC CHOLECYSTECTOMY  01-30-2003  . NEGATIVE SLEEP STUDY  2013   per pt  . RADIOFREQUENCY ABLATION NERVES  05/2016  . REMOVAL FORGEIN BODY FROM EAR  AGE 47  . RIGHT URETEROSCOPIC LASER LITHOTRIPSY STONE EXTRACTION /  STENT PLACEMENT  05-10-2000  . TRANSTHORACIC ECHOCARDIOGRAM  01-16-2010   normal  lvf/  ef  60%/  mild lae   Social History   Social History  . Marital status: Married    Spouse name: N/A  . Number of children: 2  . Years of education: 12   Occupational History  . Retired Chief of Staff   Social History Main Topics  . Smoking status: Never Smoker  . Smokeless tobacco: Never Used  . Alcohol use 0.0 oz/week     Comment: occasionally  . Drug use: No  . Sexual activity: Not on file   Other Topics Concern  . Not on file   Social History Narrative   Lives at home with wife.       Patient is right handed.   Patient drinks 1-2 cups of caffeine daily.   Allergies  Allergen Reactions  . Cymbalta [Duloxetine Hcl]     Depression  . Robaxin [Methocarbamol] Other (See Comments)    Seeing "bright lights"  . Sudafed [Pseudoephedrine Hcl] Other (See Comments)    Increased heart rate  . Epinephrine Palpitations   Family History  Problem Relation Age of Onset  . Prostate cancer Father   . Heart disease Father   . Stroke Father   . Nephrolithiasis Sister   . Cancer Sister  Intestinal  . Hyperlipidemia Sister   . Hyperlipidemia Sister   . Cancer Sister        Sinus  . COPD Unknown   . Colon polyps Brother        x2  . Colon polyps Sister   . Colon cancer Neg Hx   . Esophageal cancer Neg Hx      Past medical history, social, surgical and family history all reviewed in electronic medical record.  No pertanent information unless stated regarding to the chief complaint.   Review of Systems:Review of systems updated and as accurate as of 06/15/17  No  visual changes, nausea, vomiting, diarrhea, constipation, dizziness, abdominal pain, skin rash, fevers, chills, night sweats, weight loss, swollen lymph nodes, body aches, joint swelling,  chest pain, shortness of breath, mood changes. Positive for headaches, muscle aches  Objective  Height 6' (1.829 m). Systems examined below as of 06/15/17   General: No apparent distress alert and oriented x3 mood  and affect normal, dressed appropriately.  HEENT: Pupils equal, extraocular movements intact  Respiratory: Patient's speak in full sentences and does not appear short of breath  Cardiovascular: No lower extremity edema, non tender, no erythema  Skin: Warm dry intact with no signs of infection or rash on extremities or on axial skeleton.  Abdomen: Soft nontender  Neuro: Cranial nerves II through XII are intact, neurovascularly intact in all extremities with 2+ DTRs and 2+ pulses.  Lymph: No lymphadenopathy of posterior or anterior cervical chain or axillae bilaterally.  Gait normal with good balance and coordination.  MSK:  Non tender with full range of motion and good stability and symmetric strength and tone of shoulders, elbows, wrist, hip, knee and ankles bilaterally.  Neck: Inspection mild loss in lordosis. No palpable stepoffs. Negative Spurling's maneuver. Decreased range of motion lacking the last 5 of side bending bilaterally Grip strength and sensation normal in bilateral hands Strength good C4 to T1 distribution No sensory change to C4 to T1 Negative Hoffman sign bilaterally Reflexes normal Severe tenderness to palpation in the upper thoracic spine paraspinal musculature.  Osteopathic findings C2 flexed rotated and side bent right C4 flexed rotated and side bent left T4 extended rotated and side bent right inhaled rib     Impression and Recommendations:     This case required medical decision making of moderate complexity.      Note: This dictation was prepared with Dragon dictation along with smaller phrase technology. Any transcriptional errors that result from this process are unintentional.

## 2017-06-15 NOTE — Patient Instructions (Signed)
Good to see you  Diamox 2 times a day and lets see if it helps continue the effexor Keep up on the exercises  See em again in 4 weeks

## 2017-06-15 NOTE — Assessment & Plan Note (Signed)
Patient does have asyrinx. Patient has done fairly well with the Effexor. We'll consider increasing the next follow-up. Patient will continue the other treatment options at this time the patient will also try a very low dose of dimox and see if this is beneficial. His lungs patient as well as continue with conservative therapy and see how to do. Patient will follow-up again in 4 weeks.

## 2017-06-15 NOTE — Assessment & Plan Note (Signed)
Decision today to treat with OMT was based on Physical Exam  After verbal consent patient was treated with HVLA, ME, FPR techniques in cervical, thoracic, rib lumbar and sacral areas  Patient tolerated the procedure well with improvement in symptoms  Patient given exercises, stretches and lifestyle modifications  See medications in patient instructions if given  Patient will follow up in 4 weeks 

## 2017-07-13 ENCOUNTER — Encounter: Payer: Self-pay | Admitting: Family Medicine

## 2017-07-13 ENCOUNTER — Ambulatory Visit: Payer: Medicare Other | Admitting: Family Medicine

## 2017-07-13 VITALS — BP 150/90 | HR 72 | Ht 72.0 in | Wt 185.0 lb

## 2017-07-13 DIAGNOSIS — M9981 Other biomechanical lesions of cervical region: Secondary | ICD-10-CM

## 2017-07-13 DIAGNOSIS — G950 Syringomyelia and syringobulbia: Secondary | ICD-10-CM

## 2017-07-13 DIAGNOSIS — G4486 Cervicogenic headache: Secondary | ICD-10-CM

## 2017-07-13 DIAGNOSIS — M999 Biomechanical lesion, unspecified: Secondary | ICD-10-CM | POA: Diagnosis not present

## 2017-07-13 DIAGNOSIS — R51 Headache: Secondary | ICD-10-CM | POA: Diagnosis not present

## 2017-07-13 MED ORDER — VENLAFAXINE HCL ER 75 MG PO CP24: 75.0000 mg | ORAL_CAPSULE | Freq: Every day | ORAL | 1 refills | Status: DC

## 2017-07-13 NOTE — Assessment & Plan Note (Signed)
She is doing remarkably well. Continue same medications but increased Effexor to 75 mg. Warned of side effects. Anticipated patient continued to make improvement. Follow-up again in 4-6 weeks

## 2017-07-13 NOTE — Assessment & Plan Note (Signed)
Patient made significant improvement with the diamox. Patient will do well with conservative therapy now.

## 2017-07-13 NOTE — Patient Instructions (Signed)
Effexor 75 mg daily  Ice is your friend Stay active I am glad some improvement See me again in 4 weeks

## 2017-07-13 NOTE — Assessment & Plan Note (Signed)
Decision today to treat with OMT was based on Physical Exam  After verbal consent patient was treated with HVLA, ME, FPR techniques in cervical, thoracic, rib, lumbar and sacral areas  Patient tolerated the procedure well with improvement in symptoms  Patient given exercises, stretches and lifestyle modifications  See medications in patient instructions if given  Patient will follow up in 4 weeks 

## 2017-07-13 NOTE — Progress Notes (Signed)
[name] [address] [place], [place] [address] Phone: [phone] Subjective:     CC: Headaches, neck pain  XIP:[id number]  Nameer R Evetts is a 66 y.o. male coming in with complaint of headache and neck pain. Patient has been seen previously and does have a syrinx in the cervical and thoracic spine. Patient did started on the Effexor previously with some mild improvement. In addition of this patient feels diamox has helped out considerably as well. Patient is having significantly decreased amount of headaches as well as severity. Feels like he does not have as much chronic pain.     Past Medical History:  Diagnosis Date  . Acute meniscal tear of left knee   . Allergic rhinitis   . BPH (benign prostatic hypertrophy)   . Cervical spondylosis without myelopathy   . Cervicogenic headache   . Chronic neck pain    uses TENS unit  prn  . Diverticulitis   . GERD (gastroesophageal reflux disease)   . H/O hiatal hernia   . History of diverticulitis   . History of kidney stones   . History of TIA (transient ischemic attack)    04/ 2011---  no residuals  . Hypertension   . Hypothyroidism   . Peripheral neuropathy   . Shingles   . Syrinx of spinal cord ([place])    C4 -- C7   Past Surgical History:  Procedure Laterality Date  . CARDIAC CATHETERIZATION  02-18-2010   dr Angelena Form   normal coronary arteries/  normal lvsf--  ef 65%  . COLONOSCOPY    . [place]  . KNEE ARTHROSCOPY Left 03/13/2014   Procedure: LEFT ARTHROSCOPY KNEE WITH DEBRIDMENT;  Surgeon: Gearlean Alf, MD;  Location: [facility];  Service: Orthopedics;  Laterality: Left;  . LAPAROSCOPIC CHOLECYSTECTOMY  01-30-2003  . NEGATIVE SLEEP STUDY  2013   per pt  . RADIOFREQUENCY ABLATION NERVES  05/2016  . REMOVAL FORGEIN BODY FROM EAR  AGE 31  . RIGHT URETEROSCOPIC LASER LITHOTRIPSY STONE EXTRACTION /  STENT PLACEMENT  05-10-2000  . TRANSTHORACIC ECHOCARDIOGRAM   01-16-2010   normal lvf/  ef  60%/  mild lae   Social History   Social History  . Marital status: Married    Spouse name: N/A  . Number of children: 2  . Years of education: 12   Occupational History  . Retired Chief of Staff   Social History Main Topics  . Smoking status: Never Smoker  . Smokeless tobacco: Never Used  . Alcohol use 0.0 oz/week     Comment: occasionally  . Drug use: No  . Sexual activity: Not Asked   Other Topics Concern  . None   Social History Narrative   Lives at home with wife.       Patient is right handed.   Patient drinks 1-2 cups of caffeine daily.   Allergies  Allergen Reactions  . Cymbalta [Duloxetine Hcl]     Depression  . Robaxin [Methocarbamol] Other (See Comments)    Seeing "bright lights"  . Sudafed [Pseudoephedrine Hcl] Other (See Comments)    Increased heart rate  . Epinephrine Palpitations   Family History  Problem Relation Age of Onset  . Prostate cancer Father   . Heart disease Father   . Stroke Father   . Nephrolithiasis Sister   . Cancer Sister        Intestinal  . Hyperlipidemia Sister   . Hyperlipidemia Sister   .  Cancer Sister        Sinus  . COPD Unknown   . Colon polyps Brother        x2  . Colon polyps Sister   . Colon cancer Neg Hx   . Esophageal cancer Neg Hx      Past medical history, social, surgical and family history all reviewed in electronic medical record.  No pertanent information unless stated regarding to the chief complaint.   Review of Systems:Review of systems updated and as accurate as of 07/13/17  No  visual changes, nausea, vomiting, diarrhea, constipation, dizziness, abdominal pain, skin rash, fevers, chills, night sweats, weight loss, swollen lymph nodes, body aches, joint swelling,  chest pain, shortness of breath, mood changes. Positive headache and muscle aches  Objective  Blood pressure (!) 150/90, pulse 72, height 6' (1.829 m), weight 185 lb (83.9 kg), SpO2 96 %. Systems examined  below as of 07/13/17   General: No apparent distress alert and oriented x3 mood and affect normal, dressed appropriately.  HEENT: Pupils equal, extraocular movements intact  Respiratory: Patient's speak in full sentences and does not appear short of breath  Cardiovascular: No lower extremity edema, non tender, no erythema  Skin: Warm dry intact with no signs of infection or rash on extremities or on axial skeleton.  Abdomen: Soft nontender  Neuro: Cranial nerves II through XII are intact, neurovascularly intact in all extremities with 2+ DTRs and 2+ pulses.  Lymph: No lymphadenopathy of posterior or anterior cervical chain or axillae bilaterally.  Gait normal with good balance and coordination.  MSK:  Non tender with full range of motion and good stability and symmetric strength and tone of shoulders, elbows, wrist, hip, knee and ankles bilaterally.  Neck: Inspection continued tightness noted improvement.. No palpable stepoffs. Negative Spurling's maneuver. Lacking the last 5 of extension as well as side bending bilaterally. Mild crepitus with range of motion Grip strength and sensation normal in bilateral hands Strength good C4 to T1 distribution No sensory change to C4 to T1 Negative Hoffman sign bilaterally Reflexes normal continued tightness between the shoulder blades of the thoracic spine as well  Osteopathic findings  C2 flexed rotated and side bent right C4 flexed rotated and side bent left C5 flexed rotated and side bent left T3 extended rotated and side bent right inhaled third rib T9 extended rotated and side bent left L1 flexed rotated and side bent right Sacrum right on right    Impression and Recommendations:     This case required medical decision making of moderate complexity.      Note: This dictation was prepared with Dragon dictation along with smaller phrase technology. Any transcriptional errors that result from this process are unintentional.

## 2017-07-18 ENCOUNTER — Other Ambulatory Visit: Payer: Self-pay | Admitting: *Deleted

## 2017-07-18 MED ORDER — LOSARTAN POTASSIUM 50 MG PO TABS: 50.0000 mg | ORAL_TABLET | Freq: Every day | ORAL | 0 refills | Status: DC

## 2017-08-08 ENCOUNTER — Other Ambulatory Visit: Payer: Self-pay | Admitting: *Deleted

## 2017-08-08 MED ORDER — VENLAFAXINE HCL ER 75 MG PO CP24: 75.0000 mg | ORAL_CAPSULE | Freq: Every day | ORAL | 0 refills | Status: DC

## 2017-08-08 NOTE — Telephone Encounter (Signed)
Refill done.  

## 2017-08-10 ENCOUNTER — Encounter: Payer: Self-pay | Admitting: Family Medicine

## 2017-08-10 ENCOUNTER — Ambulatory Visit (INDEPENDENT_AMBULATORY_CARE_PROVIDER_SITE_OTHER): Payer: Medicare Other | Admitting: Family Medicine

## 2017-08-10 VITALS — BP 152/98 | HR 81 | Wt 185.0 lb

## 2017-08-10 DIAGNOSIS — M47812 Spondylosis without myelopathy or radiculopathy, cervical region: Secondary | ICD-10-CM

## 2017-08-10 DIAGNOSIS — M999 Biomechanical lesion, unspecified: Secondary | ICD-10-CM | POA: Diagnosis not present

## 2017-08-10 MED ORDER — VENLAFAXINE HCL ER 37.5 MG PO CP24: 37.5000 mg | ORAL_CAPSULE | Freq: Every day | ORAL | 1 refills | Status: DC

## 2017-08-10 NOTE — Patient Instructions (Signed)
Good to see you  Alvera Singh is your friend.  Effexor lets back it back down to 37.5 mg  Continue the other medications and the vitamins See me again in 4 weeks

## 2017-08-10 NOTE — Assessment & Plan Note (Signed)
Decision today to treat with OMT was based on Physical Exam  After verbal consent patient was treated with HVLA, ME, FPR techniques in cervical, thoracic, rib areas  Patient tolerated the procedure well with improvement in symptoms  Patient given exercises, stretches and lifestyle modifications  See medications in patient instructions if given  Patient will follow up in 4 weeks 

## 2017-08-10 NOTE — Assessment & Plan Note (Signed)
Patient is not making improvement with the higher dose of the Effexor and go back down to 37.5 mg.  We will see if that gets patient back to where he was doing well.  Continue all other medications.  Encourage patient to do the exercises on a more regular basis as well.  Follow-up again 4 weeks

## 2017-08-10 NOTE — Progress Notes (Signed)
[facility] [address] [place], [place] [address] Phone: [phone] Subjective:    I'm seeing this patient by the request  of:    CC: neck and back pain   YKD:[id number]  Jacob Kerr is a 66 y.o. male coming in with complaint of neck and back pain.  Patient does have a diagnosis of a syrinx in the cervical and thoracic spine.  Started on Effexor and was making improvement as well as with the Diamox.  Patient was not having as much pain.  We will increase patient's Effexor to 75 mg.  He has had [place] different nerve pain from time to time.  And possibly worsening headaches at this time.  Does not feel like the medication has made as much improvement.    Past Medical History:  Diagnosis Date  . Acute meniscal tear of left knee   . Allergic rhinitis   . BPH (benign prostatic hypertrophy)   . Cervical spondylosis without myelopathy   . Cervicogenic headache   . Chronic neck pain    uses TENS unit  prn  . Diverticulitis   . GERD (gastroesophageal reflux disease)   . H/O hiatal hernia   . History of diverticulitis   . History of kidney stones   . History of TIA (transient ischemic attack)    04/ 2011---  no residuals  . Hypertension   . Hypothyroidism   . Peripheral neuropathy   . Shingles   . Syrinx of spinal cord ([place])    C4 -- C7   Past Surgical History:  Procedure Laterality Date  . CARDIAC CATHETERIZATION  02-18-2010   dr Angelena Form   normal coronary arteries/  normal lvsf--  ef 65%  . COLONOSCOPY    . [place]  . LAPAROSCOPIC CHOLECYSTECTOMY  01-30-2003  . NEGATIVE SLEEP STUDY  2013   per pt  . RADIOFREQUENCY ABLATION NERVES  05/2016  . REMOVAL FORGEIN BODY FROM EAR  AGE 41  . RIGHT URETEROSCOPIC LASER LITHOTRIPSY STONE EXTRACTION /  STENT PLACEMENT  05-10-2000  . TRANSTHORACIC ECHOCARDIOGRAM  01-16-2010   normal lvf/  ef  60%/  mild lae   Social History   Socioeconomic History  . Marital status: Married   Spouse name: None  . Number of children: 2  . Years of education: 5  . Highest education level: None  Social Needs  . Financial resource strain: None  . Food insecurity - worry: None  . Food insecurity - inability: None  . Transportation needs - medical: None  . Transportation needs - non-medical: None  Occupational History  . Occupation: Retired    Fish farm manager: Chief of Staff  Tobacco Use  . Smoking status: Never Smoker  . Smokeless tobacco: Never Used  Substance and Sexual Activity  . Alcohol use: Yes    Alcohol/week: 0.0 oz    Comment: occasionally  . Drug use: No  . Sexual activity: None  Other Topics Concern  . None  Social History Narrative   Lives at home with wife.       Patient is right handed.   Patient drinks 1-2 cups of caffeine daily.   Allergies  Allergen Reactions  . Cymbalta [Duloxetine Hcl]     Depression  . Robaxin [Methocarbamol] Other (See Comments)    Seeing "bright lights"  . Sudafed [Pseudoephedrine Hcl] Other (See Comments)    Increased heart rate  . Epinephrine Palpitations   Family History  Problem Relation Age of Onset  .  Prostate cancer Father   . Heart disease Father   . Stroke Father   . Nephrolithiasis Sister   . Cancer Sister        Intestinal  . Hyperlipidemia Sister   . Hyperlipidemia Sister   . Cancer Sister        Sinus  . COPD Unknown   . Colon polyps Brother        x2  . Colon polyps Sister   . Colon cancer Neg Hx   . Esophageal cancer Neg Hx      Past medical history, social, surgical and family history all reviewed in electronic medical record.  No pertanent information unless stated regarding to the chief complaint.   Review of Systems:Review of systems updated and as accurate as of 08/10/17  No , visual changes, nausea, vomiting, diarrhea, constipation, , abdominal pain, skin rash, fevers, chills, night sweats, weight loss, swollen lymph nodes, body aches, joint swelling, chest pain, shortness of breath, mood  changes.  Headaches, dizziness, muscle aches  Objective  Blood pressure (!) 152/98, pulse 81, weight 185 lb (83.9 kg), SpO2 98 %. Systems examined below as of 08/10/17   General: No apparent distress alert and oriented x3 mood and affect normal, dressed appropriately.  HEENT: Pupils equal, extraocular movements intact  Respiratory: Patient's speak in full sentences and does not appear short of breath  Cardiovascular: No lower extremity edema, non tender, no erythema  Skin: Warm dry intact with no signs of infection or rash on extremities or on axial skeleton.  Abdomen: Soft nontender  Neuro: Cranial nerves II through XII are intact, neurovascularly intact in all extremities with 2+ DTRs and 2+ pulses.  Lymph: No lymphadenopathy of posterior or anterior cervical chain or axillae bilaterally.  Gait normal with good balance and coordination.  MSK:  Non tender with full range of motion and good stability and symmetric strength and tone of shoulders, elbows, wrist, hip, knee and ankles bilaterally.  Neck: Inspection mild loss of lordosis. No palpable stepoffs. Negative Spurling's maneuver. Full neck range of motion patient does have pain in the terminal ranges and does get dizzy with extension Grip strength and sensation normal in bilateral hands Strength good C4 to T1 distribution No sensory change to C4 to T1 Negative Hoffman sign bilaterally Reflexes normal  Osteopathic findings Cervical C2 flexed rotated and side bent right C4 flexed rotated and side bent left C6 flexed rotated and side bent right T4 extended rotated and side bent right inhaled rib     Impression and Recommendations:     This case required medical decision making of moderate complexity.      Note: This dictation was prepared with Dragon dictation along with smaller phrase technology. Any transcriptional errors that result from this process are unintentional.

## 2017-08-19 ENCOUNTER — Other Ambulatory Visit: Payer: Self-pay | Admitting: Cardiology

## 2017-08-22 DIAGNOSIS — L80 Vitiligo: Secondary | ICD-10-CM | POA: Diagnosis not present

## 2017-08-25 DIAGNOSIS — L80 Vitiligo: Secondary | ICD-10-CM | POA: Diagnosis not present

## 2017-08-30 ENCOUNTER — Ambulatory Visit: Payer: Medicare Other | Admitting: Family Medicine

## 2017-08-30 ENCOUNTER — Encounter: Payer: Self-pay | Admitting: Family Medicine

## 2017-08-30 VITALS — BP 138/82 | HR 79 | Temp 97.2°F | Ht 73.0 in | Wt 200.8 lb

## 2017-08-30 DIAGNOSIS — K4090 Unilateral inguinal hernia, without obstruction or gangrene, not specified as recurrent: Secondary | ICD-10-CM | POA: Diagnosis not present

## 2017-08-30 DIAGNOSIS — L80 Vitiligo: Secondary | ICD-10-CM | POA: Diagnosis not present

## 2017-08-30 NOTE — Progress Notes (Signed)
   HPI  Patient presents today here with left knee pain.  Patient explains that for the last year so he has had intermittent left-sided groin pain intermittently.  He states he has been told he had a hernia previously when he had an episode of acute diverticulitis.  We could not find these findings on a review of CT scans.  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  He states that it does seem to get worse with lifting his grandson for extended period of time. Patient states that his testicle always returns to the proper place whenever he lays down in a warm bath or pulls it down. He denies any persistent severe pain.  PMH: Smoking status noted ROS: Per HPI  Objective: BP 138/82   Pulse 79   Temp (!) 97.2 F (36.2 C) (Oral)   Ht 6\' 1"  (1.854 m)   Wt 200 lb 12.8 oz (91.1 kg)   BMI 26.49 kg/m  Gen: NAD, alert, cooperative with exam HEENT: NCAT CV: RRR, good S1/S2, no murmur Resp: CTABL, no wheezes, non-labored Ext: No edema, warm Neuro: Alert and oriented, No gross deficits GU-  Left-sided inguinal hernia palpated with no signs of incarceration  Assessment and plan:  #Left-sided inguinal hernia. Recommended referral to general surgery, patient is moderately symptomatic however they are persistent symptoms. He is interested in surgery if necessary. Discussed signs of incarceration and red flags to seek emergency medical care.      Orders Placed This Encounter  Procedures  . Ambulatory referral to General Surgery    Referral Priority:   Routine    Referral Type:   Surgical    Referral Reason:   Specialty Services Required    Referred to Provider:   [name], MD    Requested Specialty:   General Surgery    Number of Visits Requested:   [name], MD [facility] 08/30/2017, 8:52 AM

## 2017-08-30 NOTE — Patient Instructions (Signed)
Great to see you!  Come back as needed  I believe you have an inguinal hernia. I have sent a referral to a surgeon for you.    Inguinal Hernia, Adult An inguinal hernia is when fat or the intestines push through the area where the leg meets the lower belly (groin) and make a rounded lump (bulge). This condition happens over time. There are three types of inguinal hernias. These types include:  Hernias that can be pushed back into the belly (are reducible).  Hernias that cannot be pushed back into the belly (are incarcerated).  Hernias that cannot be pushed back into the belly and lose their blood supply (get strangulated). This type needs emergency surgery.  Follow these instructions at home: Lifestyle  Drink enough fluid to keep your urine (pee) clear or pale yellow.  Eat plenty of fruits, vegetables, and whole grains. These have a lot of fiber. Talk with your doctor if you have questions.  Avoid lifting heavy objects.  Avoid standing for long periods of time.  Do not use tobacco products. These include cigarettes, chewing tobacco, or e-cigarettes. If you need help quitting, ask your doctor.  Try to stay at a healthy weight. General instructions  Do not try to force the hernia back in.  Watch your hernia for any changes in color or size. Let your doctor know if there are any changes.  Take over-the-counter and prescription medicines only as told by your doctor.  Keep all follow-up visits as told by your doctor. This is important. Contact a doctor if:  You have a fever.  You have new symptoms.  Your symptoms get worse. Get help right away if:  The area where the legs meets the lower belly has: ? Pain that gets worse suddenly. ? A bulge that gets bigger suddenly and does not go down. ? A bulge that turns red or purple. ? A bulge that is painful to the touch.  You are a man and your scrotum: ? Suddenly feels painful. ? Suddenly changes in size.  You feel sick  to your stomach (nauseous) and this feeling does not go away.  You throw up (vomit) and this keeps happening.  You feel your heart beating a lot more quickly than normal.  You cannot poop (have a bowel movement) or pass gas. This information is not intended to replace advice given to you by your health care provider. Make sure you discuss any questions you have with your health care provider. Document Released: 10/14/2006 Document Revised: 02/19/2016 Document Reviewed: 07/24/2014 Elsevier Interactive Patient Education  2018 [employer].

## 2017-09-01 DIAGNOSIS — L80 Vitiligo: Secondary | ICD-10-CM | POA: Diagnosis not present

## 2017-09-07 ENCOUNTER — Ambulatory Visit: Payer: Medicare Other | Admitting: Family Medicine

## 2017-09-12 ENCOUNTER — Telehealth: Payer: Self-pay | Admitting: *Deleted

## 2017-09-12 MED ORDER — AMLODIPINE BESYLATE 10 MG PO TABS: 10.0000 mg | ORAL_TABLET | Freq: Every day | ORAL | 1 refills | Status: DC

## 2017-09-12 NOTE — Telephone Encounter (Signed)
Refill of norvasc

## 2017-09-13 ENCOUNTER — Ambulatory Visit (INDEPENDENT_AMBULATORY_CARE_PROVIDER_SITE_OTHER): Payer: Medicare Other | Admitting: Family Medicine

## 2017-09-13 VITALS — BP 147/95 | HR 70 | Temp 97.0°F | Ht 73.0 in | Wt 195.0 lb

## 2017-09-13 DIAGNOSIS — R0789 Other chest pain: Secondary | ICD-10-CM

## 2017-09-13 LAB — TROPONIN I

## 2017-09-13 MED ORDER — LORAZEPAM 0.5 MG PO TABS
0.5000 mg | ORAL_TABLET | Freq: Two times a day (BID) | ORAL | 0 refills | Status: DC | PRN
Start: 2017-09-13 — End: 2017-09-30

## 2017-09-13 NOTE — Progress Notes (Signed)
   HPI  Patient presents today chest tightness.  Patient states that this "has been brewing for a few days".  He states that this morning he had an episode of chest tightness and feeling like the room was closing in on him.  He called his daughter who is a triage nurse in our clinic who came to him and gave him a half of a Klonopin.  He had good improvement of symptoms with that.  He still has some sensation of feeling odd, however does not endorse any chest pain or chest tightness still.  He states the chest tightness radiated up to his bilateral neck. Not associated with shortness of breath or exertion. He also began to improve whenever his grandson was in his lap, and people were celebrating his birthday, it is his birthday.  Daughter states that on the phone he was tearful and stated that he had chest tightness.  PMH: Smoking status noted ROS: Per HPI  Objective: BP (!) 147/95   Pulse 70   Temp (!) 97 F (36.1 C) (Oral)   Ht 6\' 1"  (1.854 m)   Wt 195 lb (88.5 kg)   BMI 25.73 kg/m  Gen: NAD, alert, cooperative with exam HEENT: NCAT CV: RRR, good S1/S2, no murmur Resp: CTABL, no wheezes, non-labored Ext: No edema, warm Neuro: Alert and oriented, No gross deficits  Assessment and plan:  #Chest tightness Likely anxiety attack Patient has had similar attack previously but very infrequently. Good response to benzodiazepine, given a small amount of Ativan. Single troponin given symptoms for a few days, however his most discrete episode was this morning.  I generally do not check outpatient troponins, however given that he has very good follow-up and access to our clinic having family that were care I think it safe. EKG was within normal limits.  Continue Effexor, if this becomes more frequent would consider titration or even changing agents as he stated that he did not tolerate titration recently.     Orders Placed This Encounter  Procedures  . Troponin I  . EKG  12-Lead    Meds ordered this encounter  Medications  . LORazepam (ATIVAN) 0.5 MG tablet    Sig: Take 1 tablet (0.5 mg total) by mouth 2 (two) times daily as needed for anxiety.    Dispense:  20 tablet    Refill:  0    Laroy Apple, MD [facility] 09/13/2017, 12:08 PM

## 2017-09-13 NOTE — Patient Instructions (Addendum)
Great to see  Chest Wall Pain Chest wall pain is pain in or around the bones and muscles of your chest. Sometimes, an injury causes this pain. Sometimes, the cause may not be known. This pain may take several weeks or longer to get better. Follow these instructions at home: Pay attention to any changes in your symptoms. Take these actions to help with your pain:  Rest as told by your doctor.  Avoid activities that cause pain. Try not to use your chest, belly (abdominal), or side muscles to lift heavy things.  If directed, apply ice to the painful area: ? Put ice in a plastic bag. ? Place a towel between your skin and the bag. ? Leave the ice on for 20 minutes, 2-3 times per day.  Take over-the-counter and prescription medicines only as told by your doctor.  Do not use tobacco products, including cigarettes, chewing tobacco, and e-cigarettes. If you need help quitting, ask your doctor.  Keep all follow-up visits as told by your doctor. This is important.  Contact a doctor if:  You have a fever.  Your chest pain gets worse.  You have new symptoms. Get help right away if:  You feel sick to your stomach (nauseous) or you throw up (vomit).  You feel sweaty or light-headed.  You have a cough with phlegm (sputum) or you cough up blood.  You are short of breath. This information is not intended to replace advice given to you by your health care provider. Make sure you discuss any questions you have with your health care provider. Document Released: 03/01/2008 Document Revised: 02/19/2016 Document Reviewed: 12/09/2014 Elsevier Interactive Patient Education  [employer]. you!

## 2017-09-30 ENCOUNTER — Other Ambulatory Visit: Payer: Self-pay | Admitting: *Deleted

## 2017-09-30 ENCOUNTER — Telehealth: Payer: Self-pay | Admitting: *Deleted

## 2017-09-30 MED ORDER — LORAZEPAM 0.5 MG PO TABS: 0.5000 mg | ORAL_TABLET | Freq: Two times a day (BID) | ORAL | 2 refills | Status: DC | PRN

## 2017-09-30 NOTE — Telephone Encounter (Signed)
Has been using Ativan 1 to 2 times a day as needed. It has helped with his anxiety and has helped his sleep tremendously. He is able to sleep a full night and feels rested when he wakes up. He hasn't been able to do that in about 7 years. His chronic pain is also more bearable.    Requesting a refill and has scheduled a follow up appointment with Dr Wendi Snipes on 10/04/17.

## 2017-09-30 NOTE — Telephone Encounter (Signed)
Patient is doing very well with Ativan plus S and RI.  His chest pain has resolved and he is sleeping very well.  His neck pain is also improving.  He is using it mostly once a day and sometimes twice daily.  He has follow-up appointment within 1 week.  I have refilled his Ativan.  Laroy Apple, MD [facility] 09/30/2017, 3:34 PM

## 2017-10-04 ENCOUNTER — Ambulatory Visit: Payer: Medicare Other | Admitting: Family Medicine

## 2017-10-04 ENCOUNTER — Telehealth: Payer: Self-pay | Admitting: *Deleted

## 2017-10-04 ENCOUNTER — Encounter: Payer: Self-pay | Admitting: *Deleted

## 2017-10-04 ENCOUNTER — Encounter: Payer: Self-pay | Admitting: Family Medicine

## 2017-10-04 VITALS — BP 125/84 | HR 84 | Temp 97.3°F | Ht 73.0 in | Wt 203.0 lb

## 2017-10-04 DIAGNOSIS — F419 Anxiety disorder, unspecified: Secondary | ICD-10-CM

## 2017-10-04 DIAGNOSIS — R0789 Other chest pain: Secondary | ICD-10-CM | POA: Diagnosis not present

## 2017-10-04 MED ORDER — VENLAFAXINE HCL ER 75 MG PO CP24: 75.0000 mg | ORAL_CAPSULE | Freq: Every day | ORAL | 1 refills | Status: DC

## 2017-10-04 NOTE — Patient Instructions (Signed)
Great to see you!  Increase effexor to 75 mg, continue ativan as needed  See Dr. Percival Spanish.   Come back in about 2 months

## 2017-10-04 NOTE — Progress Notes (Signed)
   HPI  Patient presents today follow-up for chest tightness and anxiety.  Patient was seen on December 18 for chest tightness which responded well to Klonopin.  He was started on as needed Ativan and has not had another episode.  He states that he has taken Ativan on almost a daily basis and at times taken twice daily. He previously tried to titrate Effexor but was concerned that some hand tingling was a side effect of increasing the dose, however he since learned that it is actually a side effect of another medication.  He would like to titrate at this time.  He continues to have central back pain which has been well explained for musculoskeletal reasons. He had no obstructive coronary disease on previous cath.  PMH: Smoking status noted ROS: Per HPI  Objective: BP 125/84 (BP Location: Left Arm)   Pulse 84   Temp (!) 97.3 F (36.3 C) (Oral)   Ht 6\' 1"  (1.854 m)   Wt 203 lb (92.1 kg)   BMI 26.78 kg/m  Gen: NAD, alert, cooperative with exam HEENT: NCAT CV: RRR, good S1/S2, no murmur Resp: CTABL, no wheezes, non-labored Ext: No edema, warm Neuro: Alert and oriented, No gross deficits  Assessment and plan:  #Chest tightness Resolved, patient doing well with as needed Ativan. Likely anxiety related, however I have recommended that he check in with cardiology for their opinion.  #Anxiety Doing well with as needed Ativan +37.5 mg Effexor, will titrate Effexor today. Follow-up about 2 months    Meds ordered this encounter  Medications  . venlafaxine XR (EFFEXOR-XR) 75 MG 24 hr capsule    Sig: Take 1 capsule (75 mg total) by mouth daily with breakfast.    Dispense:  90 capsule    Refill:  [name], MD [facility] 10/04/2017, 8:20 AM

## 2017-10-04 NOTE — Telephone Encounter (Signed)
Rescheduled pt with [facility] Letter sent with appointment date and time of 11/02/17 at 11:20 and instructions to arrive 30 minutes early for registration

## 2017-10-07 ENCOUNTER — Other Ambulatory Visit: Payer: Self-pay | Admitting: Family Medicine

## 2017-10-10 ENCOUNTER — Encounter: Payer: Self-pay | Admitting: Family Medicine

## 2017-10-10 ENCOUNTER — Other Ambulatory Visit: Payer: Self-pay | Admitting: *Deleted

## 2017-10-10 MED ORDER — ACETAZOLAMIDE 125 MG PO TABS: 125.0000 mg | ORAL_TABLET | Freq: Two times a day (BID) | ORAL | 1 refills | Status: DC

## 2017-10-10 NOTE — Telephone Encounter (Signed)
Refill sent to pharmacy.   

## 2017-10-20 ENCOUNTER — Other Ambulatory Visit: Payer: Self-pay | Admitting: Family Medicine

## 2017-11-02 DIAGNOSIS — K4090 Unilateral inguinal hernia, without obstruction or gangrene, not specified as recurrent: Secondary | ICD-10-CM | POA: Diagnosis not present

## 2017-11-25 DIAGNOSIS — K4090 Unilateral inguinal hernia, without obstruction or gangrene, not specified as recurrent: Secondary | ICD-10-CM | POA: Diagnosis not present

## 2017-12-05 ENCOUNTER — Other Ambulatory Visit: Payer: Self-pay

## 2017-12-05 MED ORDER — LEVOTHYROXINE SODIUM 175 MCG PO TABS: 175.0000 ug | ORAL_TABLET | Freq: Every day | ORAL | 0 refills | Status: DC

## 2017-12-05 MED ORDER — LEVOTHYROXINE SODIUM 175 MCG PO TABS: 175.0000 ug | ORAL_TABLET | Freq: Every day | ORAL | 1 refills | Status: DC

## 2017-12-06 ENCOUNTER — Other Ambulatory Visit: Payer: Self-pay

## 2017-12-06 MED ORDER — LEVOTHYROXINE SODIUM 175 MCG PO TABS: 175.0000 ug | ORAL_TABLET | Freq: Every day | ORAL | 0 refills | Status: DC

## 2017-12-12 ENCOUNTER — Other Ambulatory Visit: Payer: Self-pay

## 2017-12-12 MED ORDER — LEVOTHYROXINE SODIUM 175 MCG PO TABS: 175.0000 ug | ORAL_TABLET | Freq: Every day | ORAL | 0 refills | Status: DC

## 2017-12-23 ENCOUNTER — Encounter: Payer: Self-pay | Admitting: Family Medicine

## 2017-12-23 ENCOUNTER — Ambulatory Visit (INDEPENDENT_AMBULATORY_CARE_PROVIDER_SITE_OTHER): Payer: Medicare Other | Admitting: Family Medicine

## 2017-12-23 VITALS — BP 147/88 | HR 81 | Temp 97.7°F | Ht 73.0 in | Wt 204.6 lb

## 2017-12-23 DIAGNOSIS — J189 Pneumonia, unspecified organism: Secondary | ICD-10-CM

## 2017-12-23 DIAGNOSIS — J181 Lobar pneumonia, unspecified organism: Secondary | ICD-10-CM | POA: Diagnosis not present

## 2017-12-23 DIAGNOSIS — R6883 Chills (without fever): Secondary | ICD-10-CM | POA: Diagnosis not present

## 2017-12-23 LAB — VERITOR FLU A/B WAIVED
Influenza A: NEGATIVE
Influenza B: NEGATIVE

## 2017-12-23 MED ORDER — LEVOFLOXACIN 750 MG PO TABS: 750.0000 mg | ORAL_TABLET | Freq: Every day | ORAL | 0 refills | Status: DC

## 2017-12-23 NOTE — Progress Notes (Signed)
   HPI  Patient presents today with cough.  Patient explains he has been ill about 7 or 8 days, he began to get better a few days ago and then had sudden worsening.  He describes cough productive of brown and yellow sputum, congestion, chest congestion, chills and flushing, and body aches.  Patient denies any difficulty tolerating foods or fluids.  He has had a recent death in the family and is going through a hard time.  PMH: Smoking status noted ROS: Per HPI  Objective: BP (!) 147/88   Pulse 81   Temp 97.7 F (36.5 C) (Oral)   Ht 6\' 1"  (1.854 m)   Wt 204 lb 9.6 oz (92.8 kg)   SpO2 97%   BMI 26.99 kg/m  Gen: NAD, alert, cooperative with exam HEENT: NCAT CV: RRR, good S1/S2, no murmur Resp: Good air movement, nonlabored, right lower lung field with persistent coarse breath sounds, some decreased sounds in the left lower lung field Ext: No edema, warm Neuro: Alert and oriented, No gross deficits  Assessment and plan:  #Community-acquired pneumonia Clinical diagnosis, considering lung exam pleuritic chest pain in the same area of the added coarse breath sounds, and second sickness. Treating with levaquin   Orders Placed This Encounter  Procedures  . Veritor Flu A/B Waived    Order Specific Question:   Source    Answer:   nasal     Laroy Apple, MD [facility] 12/23/2017, 9:44 AM

## 2017-12-23 NOTE — Patient Instructions (Signed)
Great to see you!   Community-Acquired Pneumonia, Adult Pneumonia is an infection of the lungs. There are different types of pneumonia. One type can develop while a person is in a hospital. A different type, called community-acquired pneumonia, develops in people who are not, or have not recently been, in the hospital or other health care facility. What are the causes? Pneumonia may be caused by bacteria, viruses, or funguses. Community-acquired pneumonia is often caused by Streptococcus pneumonia bacteria. These bacteria are often passed from one person to another by breathing in droplets from the cough or sneeze of an infected person. What increases the risk? The condition is more likely to develop in:  People who havechronic diseases, such as chronic obstructive pulmonary disease (COPD), asthma, congestive heart failure, cystic fibrosis, diabetes, or kidney disease.  People who haveearly-stage or late-stage HIV.  People who havesickle cell disease.  People who havehad their spleen removed (splenectomy).  People who havepoor dental hygiene.  People who havemedical conditions that increase the risk of breathing in (aspirating) secretions their own mouth and nose.  People who havea weakened immune system (immunocompromised).  People who smoke.  People whotravel to areas where pneumonia-causing germs commonly exist.  People whoare around animal habitats or animals that have pneumonia-causing germs, including birds, bats, rabbits, cats, and farm animals.  What are the signs or symptoms? Symptoms of this condition include:  Adry cough.  A wet (productive) cough.  Fever.  Sweating.  Chest pain, especially when breathing deeply or coughing.  Rapid breathing or difficulty breathing.  Shortness of breath.  Shaking chills.  Fatigue.  Muscle aches.  How is this diagnosed? Your health care provider will take a medical history and perform a physical exam. You may  also have other tests, including:  Imaging studies of your chest, including X-rays.  Tests to check your blood oxygen level and other blood gases.  Other tests on blood, mucus (sputum), fluid around your lungs (pleural fluid), and urine.  If your pneumonia is severe, other tests may be done to identify the specific cause of your illness. How is this treated? The type of treatment that you receive depends on many factors, such as the cause of your pneumonia, the medicines you take, and other medical conditions that you have. For most adults, treatment and recovery from pneumonia may occur at home. In some cases, treatment must happen in a hospital. Treatment may include:  Antibiotic medicines, if the pneumonia was caused by bacteria.  Antiviral medicines, if the pneumonia was caused by a virus.  Medicines that are given by mouth or through an IV tube.  Oxygen.  Respiratory therapy.  Although rare, treating severe pneumonia may include:  Mechanical ventilation. This is done if you are not breathing well on your own and you cannot maintain a safe blood oxygen level.  Thoracentesis. This procedureremoves fluid around one lung or both lungs to help you breathe better.  Follow these instructions at home:  Take over-the-counter and prescription medicines only as told by your health care provider. ? Only takecough medicine if you are losing sleep. Understand that cough medicine can prevent your body's natural ability to remove mucus from your lungs. ? If you were prescribed an antibiotic medicine, take it as told by your health care provider. Do not stop taking the antibiotic even if you start to feel better.  Sleep in a semi-upright position at night. Try sleeping in a reclining chair, or place a few pillows under your head.  Do not   use tobacco products, including cigarettes, chewing tobacco, and e-cigarettes. If you need help quitting, ask your health care provider.  Drink enough  water to keep your urine clear or pale yellow. This will help to thin out mucus secretions in your lungs. How is this prevented? There are ways that you can decrease your risk of developing community-acquired pneumonia. Consider getting a pneumococcal vaccine if:  You are older than 67 years of age.  You are older than 67 years of age and are undergoing cancer treatment, have chronic lung disease, or have other medical conditions that affect your immune system. Ask your health care provider if this applies to you.  There are different types and schedules of pneumococcal vaccines. Ask your health care provider which vaccination option is best for you. You may also prevent community-acquired pneumonia if you take these actions:  Get an influenza vaccine every year. Ask your health care provider which type of influenza vaccine is best for you.  Go to the dentist on a regular basis.  Wash your hands often. Use hand sanitizer if soap and water are not available.  Contact a health care provider if:  You have a fever.  You are losing sleep because you cannot control your cough with cough medicine. Get help right away if:  You have worsening shortness of breath.  You have increased chest pain.  Your sickness becomes worse, especially if you are an older adult or have a weakened immune system.  You cough up blood. This information is not intended to replace advice given to you by your health care provider. Make sure you discuss any questions you have with your health care provider. Document Released: 09/13/2005 Document Revised: 01/22/2016 Document Reviewed: 01/08/2015 Elsevier Interactive Patient Education  2018 [employer]  

## 2018-01-17 ENCOUNTER — Other Ambulatory Visit: Payer: Self-pay | Admitting: Family Medicine

## 2018-01-23 DIAGNOSIS — R102 Pelvic and perineal pain: Secondary | ICD-10-CM | POA: Diagnosis not present

## 2018-02-07 ENCOUNTER — Other Ambulatory Visit: Payer: Self-pay | Admitting: Family Medicine

## 2018-03-07 ENCOUNTER — Ambulatory Visit (INDEPENDENT_AMBULATORY_CARE_PROVIDER_SITE_OTHER): Payer: Medicare Other | Admitting: Family Medicine

## 2018-03-07 ENCOUNTER — Encounter: Payer: Self-pay | Admitting: Family Medicine

## 2018-03-07 VITALS — BP 141/87 | HR 77 | Temp 97.1°F | Ht 73.0 in | Wt 203.2 lb

## 2018-03-07 DIAGNOSIS — Z0000 Encounter for general adult medical examination without abnormal findings: Secondary | ICD-10-CM | POA: Diagnosis not present

## 2018-03-07 DIAGNOSIS — E349 Endocrine disorder, unspecified: Secondary | ICD-10-CM

## 2018-03-07 DIAGNOSIS — Z23 Encounter for immunization: Secondary | ICD-10-CM

## 2018-03-07 NOTE — Patient Instructions (Addendum)
Great to see you!  Come back within 1-2 weeks for fasting labs between 8 and 10 am.

## 2018-03-07 NOTE — Addendum Note (Signed)
Addended by: Karle Plumber on: 03/07/2018 04:37 PM   Modules accepted: Orders

## 2018-03-07 NOTE — Progress Notes (Signed)
   HPI  Patient presents today here for annual physical exam.  Patient continues to have right-sided back pain with burning type pain around the right rib cage posteriorly. Patient states he feels this is due to his thoracic syrinx  It is active around the house, he is watching his diet moderately.  He had a colonoscopy in 2016. He would like his testosterone checked, he has a history of testosterone deficiency.  Patient is not fasting today.  PMH: Smoking status noted ROS: Per HPI  Objective: BP (!) 141/87   Pulse 77   Temp (!) 97.1 F (36.2 C) (Oral)   Ht '6\' 1"'$  (1.854 m)   Wt 203 lb 3.2 oz (92.2 kg)   BMI 26.81 kg/m  Gen: NAD, alert, cooperative with exam HEENT: NCAT CV: RRR, good S1/S2, no murmur Resp: CTABL, no wheezes, non-labored Abd: SNTND, BS present, no guarding or organomegaly Ext: No edema, warm Neuro: Alert and oriented, No gross deficits  Assessment and plan:  #Annual physical exam Normal exam, weight slightly elevated, however at his age I would not recommend any aggressive weight loss for BMI of 26 Fasting labs, he will return for these Colonoscopy up-to-date Prevnar today   #Testosterone deficiency History of, largely asymptomatic Labs ordered per request, return between 8-10 am   Counseling provided for all vaccines and vaccine components   Orders Placed This Encounter  Procedures  . CMP14+EGFR    Standing Status:   Future    Standing Expiration Date:   03/08/2019  . CBC with Differential/Platelet    Standing Status:   Future    Standing Expiration Date:   03/08/2019  . Lipid panel    Standing Status:   Future    Standing Expiration Date:   03/08/2019  . Hepatitis C antibody    Standing Status:   Future    Standing Expiration Date:   03/08/2019  . Testosterone    Standing Status:   Future    Standing Expiration Date:   03/08/2019     Laroy Apple, MD [facility] 03/07/2018, 2:33 PM

## 2018-03-09 DIAGNOSIS — H524 Presbyopia: Secondary | ICD-10-CM | POA: Diagnosis not present

## 2018-03-09 DIAGNOSIS — H52223 Regular astigmatism, bilateral: Secondary | ICD-10-CM | POA: Diagnosis not present

## 2018-03-09 DIAGNOSIS — H5212 Myopia, left eye: Secondary | ICD-10-CM | POA: Diagnosis not present

## 2018-03-09 DIAGNOSIS — H5201 Hypermetropia, right eye: Secondary | ICD-10-CM | POA: Diagnosis not present

## 2018-03-10 ENCOUNTER — Other Ambulatory Visit: Payer: Medicare Other

## 2018-03-10 DIAGNOSIS — E349 Endocrine disorder, unspecified: Secondary | ICD-10-CM

## 2018-03-10 DIAGNOSIS — Z0000 Encounter for general adult medical examination without abnormal findings: Secondary | ICD-10-CM | POA: Diagnosis not present

## 2018-03-11 LAB — CMP14+EGFR
A/G RATIO: 1.9 (ref 1.2–2.2)
ALT: 39 IU/L (ref 0–44)
AST: 24 IU/L (ref 0–40)
Albumin: 4.3 g/dL (ref 3.6–4.8)
Alkaline Phosphatase: 122 IU/L — ABNORMAL HIGH (ref 39–117)
BUN/Creatinine Ratio: 8 — ABNORMAL LOW (ref 10–24)
BUN: 10 mg/dL (ref 8–27)
Bilirubin Total: 1.2 mg/dL (ref 0.0–1.2)
CALCIUM: 9.4 mg/dL (ref 8.6–10.2)
CO2: 24 mmol/L (ref 20–29)
Chloride: 99 mmol/L (ref 96–106)
Creatinine, Ser: 1.27 mg/dL (ref 0.76–1.27)
GFR, EST AFRICAN AMERICAN: 68 mL/min/{1.73_m2} (ref >59)
GFR, EST NON AFRICAN AMERICAN: 58 mL/min/{1.73_m2} — AB (ref >59)
GLOBULIN, TOTAL: 2.3 g/dL (ref 1.5–4.5)
Glucose: 127 mg/dL — ABNORMAL HIGH (ref 65–99)
POTASSIUM: 4 mmol/L (ref 3.5–5.2)
SODIUM: 141 mmol/L (ref 134–144)
TOTAL PROTEIN: 6.6 g/dL (ref 6.0–8.5)

## 2018-03-11 LAB — LIPID PANEL
Chol/HDL Ratio: 4.5 ratio (ref 0.0–5.0)
Cholesterol, Total: 179 mg/dL (ref 100–199)
HDL: 40 mg/dL (ref >39)
LDL Calculated: 101 mg/dL — ABNORMAL HIGH (ref 0–99)
TRIGLYCERIDES: 188 mg/dL — AB (ref 0–149)
VLDL CHOLESTEROL CAL: 38 mg/dL (ref 5–40)

## 2018-03-11 LAB — CBC WITH DIFFERENTIAL/PLATELET
BASOS: 1 %
Basophils Absolute: 0 10*3/uL (ref 0.0–0.2)
EOS (ABSOLUTE): 0.2 10*3/uL (ref 0.0–0.4)
Eos: 4 %
Hematocrit: 50.9 % (ref 37.5–51.0)
Hemoglobin: 17.8 g/dL — ABNORMAL HIGH (ref 13.0–17.7)
IMMATURE GRANS (ABS): 0 10*3/uL (ref 0.0–0.1)
IMMATURE GRANULOCYTES: 0 %
Lymphocytes Absolute: 1.4 10*3/uL (ref 0.7–3.1)
Lymphs: 27 %
MCH: 33.4 pg — ABNORMAL HIGH (ref 26.6–33.0)
MCHC: 35 g/dL (ref 31.5–35.7)
MCV: 96 fL (ref 79–97)
MONOS ABS: 0.6 10*3/uL (ref 0.1–0.9)
Monocytes: 11 %
NEUTROS PCT: 57 %
Neutrophils Absolute: 3 10*3/uL (ref 1.4–7.0)
Platelets: 230 10*3/uL (ref 150–450)
RBC: 5.33 x10E6/uL (ref 4.14–5.80)
RDW: 14.2 % (ref 12.3–15.4)
WBC: 5.3 10*3/uL (ref 3.4–10.8)

## 2018-03-11 LAB — PSA, TOTAL AND FREE
PSA, Free Pct: 40 %
PSA, Free: 0.12 ng/mL
Prostate Specific Ag, Serum: 0.3 ng/mL (ref 0.0–4.0)

## 2018-03-11 LAB — HEPATITIS C ANTIBODY

## 2018-03-11 LAB — TESTOSTERONE: Testosterone: 448 ng/dL (ref 264–916)

## 2018-03-12 NOTE — Progress Notes (Signed)
[name] [place], [place] [address] Phone: [phone] Subjective:     CC: Back pain follow-up  XVQ:[id number]  JOEY LIERMAN is a 67 y.o. male coming in with complaint of thoracic spine pain. He said that his back pain is worse than last visit. His pain increases with playing with the grandkids and towards the end of the day. He said that his psoas gets so tight that it effects his ability to use the bathroom.  Patient was found to have a syrinx.  Since we have seen patient did have a hernia repair as well as an anxiety attack.  Seen another provider and has increasing Effexor to 150 mg also continued to have discomfort and pain.  Patient was also doing much better with the syrinx when patient was on a carbonic anhydrase inhibitor.  Has not been taking this.     Past Medical History:  Diagnosis Date  . Acute meniscal tear of left knee   . Allergic rhinitis   . BPH (benign prostatic hypertrophy)   . Cervical spondylosis without myelopathy   . Cervicogenic headache   . Chronic neck pain    uses TENS unit  prn  . Diverticulitis   . GERD (gastroesophageal reflux disease)   . H/O hiatal hernia   . History of diverticulitis   . History of kidney stones   . History of TIA (transient ischemic attack)    04/ 2011---  no residuals  . Hypertension   . Hypothyroidism   . Peripheral neuropathy   . Shingles   . Syrinx of spinal cord ([place])    C4 -- C7   Past Surgical History:  Procedure Laterality Date  . CARDIAC CATHETERIZATION  02-18-2010   dr Angelena Form   normal coronary arteries/  normal lvsf--  ef 65%  . COLONOSCOPY    . [place]  . KNEE ARTHROSCOPY Left 03/13/2014   Procedure: LEFT ARTHROSCOPY KNEE WITH DEBRIDMENT;  Surgeon: Gearlean Alf, MD;  Location: [facility];  Service: Orthopedics;  Laterality: Left;  . LAPAROSCOPIC CHOLECYSTECTOMY  01-30-2003  . NEGATIVE SLEEP STUDY  2013   per pt  .  RADIOFREQUENCY ABLATION NERVES  05/2016  . REMOVAL FORGEIN BODY FROM EAR  AGE 45  . RIGHT URETEROSCOPIC LASER LITHOTRIPSY STONE EXTRACTION /  STENT PLACEMENT  05-10-2000  . TRANSTHORACIC ECHOCARDIOGRAM  01-16-2010   normal lvf/  ef  60%/  mild lae   Social History   Socioeconomic History  . Marital status: Married    Spouse name: Not on file  . Number of children: 2  . Years of education: 8  . Highest education level: Not on file  Occupational History  . Occupation: Retired    Fish farm manager: Chief of Staff  Social Needs  . Financial resource strain: Not on file  . Food insecurity:    Worry: Not on file    Inability: Not on file  . Transportation needs:    Medical: Not on file    Non-medical: Not on file  Tobacco Use  . Smoking status: Never Smoker  . Smokeless tobacco: Never Used  Substance and Sexual Activity  . Alcohol use: Yes    Alcohol/week: 0.0 oz    Comment: occasionally  . Drug use: No  . Sexual activity: Not on file  Lifestyle  . Physical activity:    Days per week: Not on file    Minutes per session: Not on file  .  Stress: Not on file  Relationships  . Social connections:    Talks on phone: Not on file    Gets together: Not on file    Attends religious service: Not on file    Active member of club or organization: Not on file    Attends meetings of clubs or organizations: Not on file    Relationship status: Not on file  Other Topics Concern  . Not on file  Social History Narrative   Lives at home with wife.       Patient is right handed.   Patient drinks 1-2 cups of caffeine daily.   Allergies  Allergen Reactions  . Cymbalta [Duloxetine Hcl]     Depression  . Robaxin [Methocarbamol] Other (See Comments)    Seeing "bright lights"  . Sudafed [Pseudoephedrine Hcl] Other (See Comments)    Increased heart rate  . Epinephrine Palpitations   Family History  Problem Relation Age of Onset  . Prostate cancer Father   . Heart disease Father   . Stroke  Father   . Nephrolithiasis Sister   . Cancer Sister        Intestinal  . Hyperlipidemia Sister   . Hyperlipidemia Sister   . Cancer Sister        Sinus  . COPD Unknown   . Colon polyps Brother        x2  . Colon polyps Sister   . Colon cancer Neg Hx   . Esophageal cancer Neg Hx      Past medical history, social, surgical and family history all reviewed in electronic medical record.  No pertanent information unless stated regarding to the chief complaint.   Review of Systems:Review of systems updated and as accurate as of 03/12/18  No visual changes, nausea, vomiting, diarrhea, constipation, dizziness, abdominal pain, skin rash, fevers, chills, night sweats, weight loss, swollen lymph nodes, body aches, joint swelling, chest pain, shortness of breath, mood changes.  Positive headache, muscle aches  Objective  There were no vitals taken for this visit. Systems examined below as of 03/12/18   General: No apparent distress alert and oriented x3 mood and affect normal, dressed appropriately.  HEENT: Pupils equal, extraocular movements intact  Respiratory: Patient's speak in full sentences and does not appear short of breath  Cardiovascular: No lower extremity edema, non tender, no erythema  Skin: Warm dry intact with no signs of infection or rash on extremities or on axial skeleton.  Abdomen: Soft nontender  Neuro: Cranial nerves II through XII are intact, neurovascularly intact in all extremities with 2+ DTRs and 2+ pulses.  Lymph: No lymphadenopathy of posterior or anterior cervical chain or axillae bilaterally.  Gait normal with good balance and coordination.  MSK:  Non tender with full range of motion and good stability and symmetric strength and tone of shoulders, elbows, wrist, hip, knee and ankles bilaterally.  Mild arthritic changes Neck: Inspection loss of lordosis. No palpable stepoffs. Negative Spurling's maneuver. Mild loss of lordosis and had some decreasing in side  bending as well as flexion. Grip strength and sensation normal in bilateral hands Strength good C4 to T1 distribution No sensory change to C4 to T1 Negative Hoffman sign bilaterally Reflexes normal Tightness of the trapezius bilaterally and tightness along the paraspinal musculature in the thoracic spine.  Osteopathic findings C2 flexed rotated and side bent right C6 flexed rotated and side bent left T5 extended rotated and side bent right  L2 flexed rotated and side bent right Sacrum  right on right    Impression and Recommendations:     This case required medical decision making of moderate complexity.      Note: This dictation was prepared with Dragon dictation along with smaller phrase technology. Any transcriptional errors that result from this process are unintentional.

## 2018-03-13 ENCOUNTER — Other Ambulatory Visit: Payer: Self-pay

## 2018-03-13 ENCOUNTER — Encounter: Payer: Self-pay | Admitting: Family Medicine

## 2018-03-13 ENCOUNTER — Ambulatory Visit: Payer: Medicare Other | Admitting: Family Medicine

## 2018-03-13 VITALS — BP 142/100 | HR 90 | Ht 73.0 in | Wt 203.0 lb

## 2018-03-13 DIAGNOSIS — M9903 Segmental and somatic dysfunction of lumbar region: Secondary | ICD-10-CM | POA: Diagnosis not present

## 2018-03-13 DIAGNOSIS — M999 Biomechanical lesion, unspecified: Secondary | ICD-10-CM

## 2018-03-13 DIAGNOSIS — M9901 Segmental and somatic dysfunction of cervical region: Secondary | ICD-10-CM | POA: Diagnosis not present

## 2018-03-13 DIAGNOSIS — M9902 Segmental and somatic dysfunction of thoracic region: Secondary | ICD-10-CM | POA: Diagnosis not present

## 2018-03-13 DIAGNOSIS — M9904 Segmental and somatic dysfunction of sacral region: Secondary | ICD-10-CM

## 2018-03-13 DIAGNOSIS — G950 Syringomyelia and syringobulbia: Secondary | ICD-10-CM | POA: Diagnosis not present

## 2018-03-13 DIAGNOSIS — M5416 Radiculopathy, lumbar region: Secondary | ICD-10-CM

## 2018-03-13 DIAGNOSIS — R739 Hyperglycemia, unspecified: Secondary | ICD-10-CM

## 2018-03-13 MED ORDER — ACETAZOLAMIDE 125 MG PO TABS: 125.0000 mg | ORAL_TABLET | Freq: Two times a day (BID) | ORAL | 1 refills | Status: DC

## 2018-03-13 NOTE — Assessment & Plan Note (Signed)
Decision today to treat with OMT was based on Physical Exam  After verbal consent patient was treated with  ME, FPR techniques in cervical, thoracic, lumbar and sacral areas  Patient tolerated the procedure well with improvement in symptoms  Patient given exercises, stretches and lifestyle modifications  See medications in patient instructions if given  Patient will follow up in 4 weeks 

## 2018-03-13 NOTE — Assessment & Plan Note (Signed)
Patient was doing much better.  Wanting to avoid any type of injections or possible surgical intervention.  Patient discontinued the medications that seem to be helping previously.  Encourage patient to attempt carbonic anhydrase inhibitor.  Discussed icing regimen and home exercises.  Discussed which activities of doing which wants to avoid.  Attempted osteopathic manipulation again.  Follow-up again in 4 weeks

## 2018-03-13 NOTE — Patient Instructions (Addendum)
Good to see you  Jacob Kerr is your friend.  Duxies 3 times a day for 3 days Xray downstairs today  Restart the diamox that seemed to be helping.  Over the counter start  Vitamin D 2000 IU dialy  Tart cherry extract any dose at night See me again in 3-4 weeks

## 2018-03-20 ENCOUNTER — Encounter: Payer: Self-pay | Admitting: Family Medicine

## 2018-03-26 ENCOUNTER — Other Ambulatory Visit: Payer: Self-pay | Admitting: Family Medicine

## 2018-04-07 ENCOUNTER — Telehealth: Payer: Self-pay

## 2018-04-07 MED ORDER — VENLAFAXINE HCL ER 75 MG PO CP24: 75.0000 mg | ORAL_CAPSULE | Freq: Every day | ORAL | 0 refills | Status: DC

## 2018-04-07 NOTE — Telephone Encounter (Signed)
Patient ran out of Effexor too early for insurance to pay for a refill. Hasn't taken more than prescribed. Not sure if the number given was incorrect or what happened. 10 sent in for cash price to [facility].

## 2018-04-12 ENCOUNTER — Ambulatory Visit (INDEPENDENT_AMBULATORY_CARE_PROVIDER_SITE_OTHER)
Admission: RE | Admit: 2018-04-12 | Discharge: 2018-04-12 | Disposition: A | Discharge status: Home / Self Care | Payer: Medicare Other | Source: Ambulatory Visit | Attending: Family Medicine | Admitting: Family Medicine

## 2018-04-12 ENCOUNTER — Encounter: Payer: Self-pay | Admitting: Family Medicine

## 2018-04-12 ENCOUNTER — Ambulatory Visit: Payer: Medicare Other | Admitting: Family Medicine

## 2018-04-12 VITALS — BP 146/98 | HR 71 | Ht 73.0 in | Wt 197.0 lb

## 2018-04-12 DIAGNOSIS — M4807 Spinal stenosis, lumbosacral region: Secondary | ICD-10-CM | POA: Diagnosis not present

## 2018-04-12 DIAGNOSIS — M545 Low back pain, unspecified: Secondary | ICD-10-CM

## 2018-04-12 DIAGNOSIS — M4804 Spinal stenosis, thoracic region: Secondary | ICD-10-CM | POA: Diagnosis not present

## 2018-04-12 DIAGNOSIS — G950 Syringomyelia and syringobulbia: Secondary | ICD-10-CM | POA: Diagnosis not present

## 2018-04-12 NOTE — Patient Instructions (Signed)
I am sorry you are hurting.  Try the duexis 3 times a day for 6 days  Continue the other meds Xray of the lower back today  They will call you on the MRI as well  I will write you when I get the MRI and then discuss what we found and the next steps If worsening pain before we get this done call us or go to emergency room

## 2018-04-12 NOTE — Assessment & Plan Note (Signed)
Patient has had this difficulty before and I am concerned the patient is having enlargement of the thoracic 1 and a potentially new one in the lumbar spine.  Patient's pain is out of proportion to the amount of palpation today.  Patient seems significantly uncomfortable.  Blood pressure within normal range and is not having any chest pain.  Do not think that this is vascular in nature.  Discussed with patient the right great length and I feel that advanced imaging is warranted to further evaluate.  Patient did have some stability of the syrinx between the 2 MRIs a year apart.  We need to make sure that there is not a significant expansion.  Patient is to follow-up after the imaging and we can discuss the possibility of other interventions.  Patient knows if any worsening pain to seek medical attention immediately or call 911.  Patient has understanding that.

## 2018-04-12 NOTE — Progress Notes (Signed)
[name] [place], [place] [address] Phone: [phone] Subjective:    I'm seeing this patient by the request  of:    CC: Back pain follow-up  [id number]  Jacob Kerr is a 67 y.o. male coming in with complaint of back pain. Patient feels like he has developed pain in the lower back that radiates out from the spine. Had severe pain 3 times since we last saw him and it made him have to use the restroom. Patient feels that his back is in a spasm today.  Past medical history significant for syrinx of the cervical and thoracic.  Patient had been doing much better with the Effexor as well as with the Diamox previously.  Patient discontinued this for some time and restarted it.  Patient states that his headaches feels significantly better but unfortunately pain in his thoracic spine has worsened.  Patient has had 2 episodes though as well that the pain was so bad that he did have difficulty with a bowel movement.  Past Medical History:  Diagnosis Date  . Acute meniscal tear of left knee   . Allergic rhinitis   . BPH (benign prostatic hypertrophy)   . Cervical spondylosis without myelopathy   . Cervicogenic headache   . Chronic neck pain    uses TENS unit  prn  . Diverticulitis   . GERD (gastroesophageal reflux disease)   . H/O hiatal hernia   . History of diverticulitis   . History of kidney stones   . History of TIA (transient ischemic attack)    04/ 2011---  no residuals  . Hypertension   . Hypothyroidism   . Peripheral neuropathy   . Shingles   . Syrinx of spinal cord ([place])    C4 -- C7   Past Surgical History:  Procedure Laterality Date  . CARDIAC CATHETERIZATION  02-18-2010   dr Angelena Form   normal coronary arteries/  normal lvsf--  ef 65%  . COLONOSCOPY    . [place]  . KNEE ARTHROSCOPY Left 03/13/2014   Procedure: LEFT ARTHROSCOPY KNEE WITH DEBRIDMENT;  Surgeon: Gearlean Alf, MD;  Location: [facility];  Service: Orthopedics;  Laterality: Left;  . LAPAROSCOPIC CHOLECYSTECTOMY  01-30-2003  . NEGATIVE SLEEP STUDY  2013   per pt  . RADIOFREQUENCY ABLATION NERVES  05/2016  . REMOVAL FORGEIN BODY FROM EAR  AGE 4  . RIGHT URETEROSCOPIC LASER LITHOTRIPSY STONE EXTRACTION /  STENT PLACEMENT  05-10-2000  . TRANSTHORACIC ECHOCARDIOGRAM  01-16-2010   normal lvf/  ef  60%/  mild lae   Social History   Socioeconomic History  . Marital status: Married    Spouse name: Not on file  . Number of children: 2  . Years of education: 67  . Highest education level: Not on file  Occupational History  . Occupation: Retired    Fish farm manager: Chief of Staff  Social Needs  . Financial resource strain: Not on file  . Food insecurity:    Worry: Not on file    Inability: Not on file  . Transportation needs:    Medical: Not on file    Non-medical: Not on file  Tobacco Use  . Smoking status: Never Smoker  . Smokeless tobacco: Never Used  Substance and Sexual Activity  . Alcohol use: Yes    Alcohol/week: 0.0 oz    Comment: occasionally  . Drug use: No  . Sexual activity: Not on file  Lifestyle  .  Physical activity:    Days per week: Not on file    Minutes per session: Not on file  . Stress: Not on file  Relationships  . Social connections:    Talks on phone: Not on file    Gets together: Not on file    Attends religious service: Not on file    Active member of club or organization: Not on file    Attends meetings of clubs or organizations: Not on file    Relationship status: Not on file  Other Topics Concern  . Not on file  Social History Narrative   Lives at home with wife.       Patient is right handed.   Patient drinks 1-2 cups of caffeine daily.   Allergies  Allergen Reactions  . Cymbalta [Duloxetine Hcl]     Depression  . Robaxin [Methocarbamol] Other (See Comments)    Seeing "bright lights"  . Sudafed [Pseudoephedrine Hcl] Other (See Comments)    Increased heart rate    . Epinephrine Palpitations   Family History  Problem Relation Age of Onset  . Prostate cancer Father   . Heart disease Father   . Stroke Father   . Nephrolithiasis Sister   . Cancer Sister        Intestinal  . Hyperlipidemia Sister   . Hyperlipidemia Sister   . Cancer Sister        Sinus  . COPD Unknown   . Colon polyps Brother        x2  . Colon polyps Sister   . Colon cancer Neg Hx   . Esophageal cancer Neg Hx      Past medical history, social, surgical and family history all reviewed in electronic medical record.  No pertanent information unless stated regarding to the chief complaint.   Review of Systems:Review of systems updated and as accurate as of 04/12/18  No headache, visual changes, nausea, vomiting, diarrhea, constipation, dizziness, abdominal pain, skin rash, fevers, chills, night sweats, weight loss, swollen lymph nodes, body aches, joint swelling, muscle aches, chest pain, shortness of breath, mood changes.   Objective  Blood pressure (!) 146/98, pulse 71, height 6\' 1"  (1.854 m), weight 197 lb (89.4 kg), SpO2 92 %. Systems examined below as of 04/12/18   General: No apparent distress alert and oriented x3 mood and affect normal, dressed appropriately.  HEENT: Pupils equal, extraocular movements intact  Respiratory: Patient's speak in full sentences and does not appear short of breath  Cardiovascular: No lower extremity edema, non tender, no erythema  Skin: Warm dry intact with no signs of infection or rash on extremities or on axial skeleton.  Abdomen: Soft nontender  Neuro: Cranial nerves II through XII are intact, neurovascularly intact in all extremities with 2+ DTRs and 2+ pulses.  Lymph: No lymphadenopathy of posterior or anterior cervical chain or axillae bilaterally.  Gait normal with good balance and coordination.  MSK:  Non tender with full range of motion and good stability and symmetric strength and tone of shoulders, elbows, wrist, hip, knee and  ankles bilaterally.  Mild arthritic changes of multiple joints  Patient is very uncomfortable throughout the exam.  Patient continues to change position.  With rotation of the back patient has severe amount of pain between the scapulas.  No bruit noted.  No significant abdominal pain noted on exam today.  Patient's back and lumbar spine seems to be worsening as well to the paraspinal musculature tenderness.  Patient has negative straight leg  test but significant tightness of the hip girdle that is abnormal.     Impression and Recommendations:     This case required medical decision making of moderate complexity.      Note: This dictation was prepared with Dragon dictation along with smaller phrase technology. Any transcriptional errors that result from this process are unintentional.

## 2018-04-19 ENCOUNTER — Other Ambulatory Visit: Payer: Self-pay | Admitting: Family Medicine

## 2018-04-19 ENCOUNTER — Other Ambulatory Visit: Payer: Self-pay | Admitting: Gastroenterology

## 2018-04-23 ENCOUNTER — Other Ambulatory Visit: Payer: Self-pay | Admitting: Family Medicine

## 2018-04-24 ENCOUNTER — Other Ambulatory Visit: Payer: Medicare Other

## 2018-04-24 ENCOUNTER — Ambulatory Visit
Admission: RE | Admit: 2018-04-24 | Discharge: 2018-04-24 | Disposition: A | Discharge status: Home / Self Care | Payer: Medicare Other | Source: Ambulatory Visit | Attending: Family Medicine | Admitting: Family Medicine

## 2018-04-24 DIAGNOSIS — G950 Syringomyelia and syringobulbia: Secondary | ICD-10-CM | POA: Diagnosis not present

## 2018-04-24 DIAGNOSIS — M4807 Spinal stenosis, lumbosacral region: Secondary | ICD-10-CM

## 2018-04-24 DIAGNOSIS — M4804 Spinal stenosis, thoracic region: Secondary | ICD-10-CM

## 2018-04-25 ENCOUNTER — Encounter: Payer: Self-pay | Admitting: Family Medicine

## 2018-04-27 ENCOUNTER — Other Ambulatory Visit: Payer: Medicare Other

## 2018-05-03 ENCOUNTER — Other Ambulatory Visit: Payer: Self-pay

## 2018-05-03 DIAGNOSIS — M5416 Radiculopathy, lumbar region: Secondary | ICD-10-CM

## 2018-05-18 ENCOUNTER — Encounter: Payer: Self-pay | Admitting: Physical Therapy

## 2018-05-18 ENCOUNTER — Other Ambulatory Visit: Payer: Self-pay

## 2018-05-18 ENCOUNTER — Ambulatory Visit: Payer: Medicare Other | Attending: Family Medicine | Admitting: Physical Therapy

## 2018-05-18 DIAGNOSIS — M546 Pain in thoracic spine: Secondary | ICD-10-CM | POA: Diagnosis not present

## 2018-05-18 NOTE — Therapy (Signed)
[facility] [address] [place], [place], [address] Phone: [phone]   Fax:  [phone]  Physical Therapy Evaluation  Patient Details  Name: Jacob Kerr MRN: [id number] Date of Birth: [birth year] Referring Provider: Antoine Primas    Encounter Date: 05/18/2018  PT End of Session - 05/18/18 1503    Visit Number  1    Number of Visits  12    Date for PT Re-Evaluation  06/29/18    PT Start Time  0147    PT Stop Time  0238    PT Time Calculation (min)  51 min    Activity Tolerance  Patient tolerated treatment well    Behavior During Therapy  [facility] for tasks assessed/performed       Past Medical History:  Diagnosis Date  . Acute meniscal tear of left knee   . Allergic rhinitis   . BPH (benign prostatic hypertrophy)   . Cervical spondylosis without myelopathy   . Cervicogenic headache   . Chronic neck pain    uses TENS unit  prn  . Diverticulitis   . GERD (gastroesophageal reflux disease)   . H/O hiatal hernia   . History of diverticulitis   . History of kidney stones   . History of TIA (transient ischemic attack)    04/ 2011---  no residuals  . Hypertension   . Hypothyroidism   . Peripheral neuropathy   . Shingles   . Syrinx of spinal cord ([facility])    C4 -- C7    Past Surgical History:  Procedure Laterality Date  . CARDIAC CATHETERIZATION  02-18-2010   dr Clifton James   normal coronary arteries/  normal lvsf--  ef 65%  . COLONOSCOPY    . HEMORRHOID SURGERY  1992  . KNEE ARTHROSCOPY Left 03/13/2014   Procedure: LEFT ARTHROSCOPY KNEE WITH DEBRIDMENT;  Surgeon: Loanne Drilling, MD;  Location: [facility];  Service: Orthopedics;  Laterality: Left;  . LAPAROSCOPIC CHOLECYSTECTOMY  01-30-2003  . NEGATIVE SLEEP STUDY  2013   per pt  . RADIOFREQUENCY ABLATION NERVES  05/2016  . REMOVAL FORGEIN BODY FROM EAR  AGE 4  . RIGHT URETEROSCOPIC LASER LITHOTRIPSY STONE EXTRACTION /  STENT PLACEMENT  05-10-2000  .  TRANSTHORACIC ECHOCARDIOGRAM  01-16-2010   normal lvf/  ef  60%/  mild lae    There were no vitals filed for this visit.   Subjective Assessment - 05/18/18 1815    Subjective  The patient presents to the clinic today with c/o right sided ribcage pain.  He also reports burning under his right shoulder blade.  He rates his pain at a 6/10 today but it is not uncommon for his pain to rise to a 10/10.  His pain increases with work activites around the house.  He states that this has been ongoing since a fall in June of 2015.  He was also found to have a syrinx in his thoracic spine.  He states that hot baths help decrease his pain.  He has been falling also.  I was highly recommended he use a cane. He stated he hasn't wanted to but 'I think I'm gonna need to."    Pertinent History  Peripheral neuropathy; headaches; left knee scope; neck pain; TIA.    Patient Stated Goals  Lift grandchildren with less pain.    Currently in Pain?  Yes    Pain Score  6     Pain Location  Back    Pain Orientation  Right    Pain Descriptors / Indicators  Burning    Pain Type  Chronic pain    Pain Onset  More than a month ago    Pain Frequency  Constant    Aggravating Factors   See above.    Pain Relieving Factors  See above.         [facility] PT Assessment - 05/18/18 0001      Assessment   Medical Diagnosis  Lumbar radiculopathy.    Referring Provider  Antoine Primas     Onset Date/Surgical Date  --   Ongoing.     Precautions   Precautions  Fall      Restrictions   Weight Bearing Restrictions  No      Balance Screen   Has the patient fallen in the past 6 months  Yes    How many times?  "Almost daily.  I catch myself before falling left.    Has the patient had a decrease in activity level because of a fear of falling?   Yes    Is the patient reluctant to leave their home because of a fear of falling?   Yes      Home Environment   Living Environment  Private residence      Prior Function   Level of  Independence  Independent      Posture/Postural Control   Posture/Postural Control  Postural limitations    Postural Limitations  Rounded Shoulders;Forward head    Posture Comments  Some thoracic convexity to right.      ROM / Strength   AROM / PROM / Strength  AROM;Strength      AROM   Overall AROM Comments  Functional spinal AROM.      Strength   Overall Strength Comments  Normal bilateral LE strength.      Palpation   Palpation comment  Patient c/o pain in the lower thoracic region and with overpressure at T10-12.  He then reports a radiation of pain laterally into his ribcage region and burning under his right scapula.      Special Tests    Special Tests  --   (+) Romberg test.     Ambulation/Gait   Gait Comments  Mild gait axtaxia with a tendency to "drift" leftward.                Objective measurements completed on examination: See above findings.      [facility] Adult PT Treatment/Exercise - 05/18/18 0001      Modalities   Modalities  Electrical Stimulation;Moist Heat      Moist Heat Therapy   Number Minutes Moist Heat  20 Minutes    Moist Heat Location  --   Thoracic region.     Programme researcher, broadcasting/film/video Location  Right lower thoracic region.    Electrical Stimulation Action  IFC    Electrical Stimulation Parameters  80-150 Hz x 20 minutes.    Electrical Stimulation Goals  Pain                  PT Long Term Goals - 05/18/18 1916      PT LONG TERM GOAL #1   Title  Independent with a HEP.    Time  6    Period  Weeks    Status  New      PT LONG TERM GOAL #2   Title  Perform ADL's with pain not > 4/10.    Time  6    Period  Weeks    Status  New      PT LONG TERM GOAL #3   Title  Lift grandchildren with pain not > 4/10.    Time  6    Period  Weeks    Status  New             Plan - 05/18/18 1904    Clinical Impression Statement  The patient presents to OPPT with a CC today of right lower thoracic  pain that radiates into his ribs and he experiences a burning sensation under his right rib cage.  He was diagnosed with a thoracic syrinx sometime ago.  He has times especially with increased work activties in which his pain is severe.  Patient has also been losing his balance to the left and demonstrated a positive Romberg test.  It was highly recommended he use a cane for safety.    History and Personal Factors relevant to plan of care:  Peripheral neuropathy; headaches; left knee scope; neck pain; TIA.    Clinical Presentation  Unstable    Clinical Presentation due to:  Syrinx of thoracic spine.    Clinical Decision Making  Moderate    Rehab Potential  Fair    PT Frequency  2x / week    PT Duration  6 weeks    PT Treatment/Interventions  ADLs/Self Care Home Management;Cryotherapy;Electrical Stimulation;Ultrasound;Traction;Moist Heat;Therapeutic activities;Therapeutic exercise;Balance training;Neuromuscular re-education;Patient/family education;Manual techniques    PT Next Visit Plan  IFC and HMP; core exercises; Combo e'stim/U/S and STW/M.  FALL RISK.    Consulted and Agree with Plan of Care  Patient       Patient will benefit from skilled therapeutic intervention in order to improve the following deficits and impairments:  Abnormal gait, Decreased activity tolerance, Decreased balance, Pain  Visit Diagnosis: Pain in thoracic spine - Plan: PT plan of care cert/re-cert     Problem List Patient Active Problem List   Diagnosis Date Noted  . Anxiety 10/04/2017  . Nonallopathic lesion of rib cage 06/15/2017  . Nonallopathic lesion of cervical region 05/18/2017  . Nonallopathic lesion of thoracic region 05/18/2017  . Nonallopathic lesion of lumbosacral region 05/18/2017  . Arrhythmia 10/08/2014  . TIA (transient ischemic attack) 08/09/2014  . Lateral meniscal tear 03/12/2014  . Testosterone deficiency 07/04/2013  . Cervicogenic headache 03/28/2013  . Vitamin D deficiency 01/01/2013   . Hyperlipidemia 01/01/2013  . BPH (benign prostatic hyperplasia) 11/03/2012  . Diverticulosis large intestine w/o perforation or abscess w/bleeding 11/03/2012  . Peripheral neuropathy 11/03/2012  . Unspecified hypothyroidism 01/18/2011  . Essential hypertension 01/18/2011  . GERD (gastroesophageal reflux disease) 01/18/2011  . Cervical spondylosis without myelopathy 11/24/2010  . Headache(784.0) 11/24/2010  . Sleep disturbance, unspecified 11/24/2010  . Syringomyelia and syringobulbia ([facility]CC) 11/24/2010  . Disturbance of skin sensation 11/24/2010  . Cerebrovascular disease, unspecified 11/24/2010    April Carlyon, [place] MPT 05/18/2018, 7:20 PM  [facility] [address] [place], [place], [address] Phone: [phone]   Fax:  [phone]  Name: Jacob Kerr MRN: [id number] Date of Birth: October 23, 1950

## 2018-05-19 ENCOUNTER — Encounter: Payer: Self-pay | Admitting: Family Medicine

## 2018-05-22 ENCOUNTER — Ambulatory Visit: Payer: Medicare Other | Admitting: Physical Therapy

## 2018-05-22 ENCOUNTER — Encounter: Payer: Self-pay | Admitting: Physical Therapy

## 2018-05-22 DIAGNOSIS — M546 Pain in thoracic spine: Secondary | ICD-10-CM

## 2018-05-22 NOTE — Therapy (Signed)
[facility] [place], [place], [address] Phone: [phone]   Fax:  [phone]  Physical Therapy Treatment  Patient Details  Name: Jacob Kerr MRN: [id number] Date of Birth: Oct 03, 1950 Referring Provider: Hulan Saas    Encounter Date: 05/22/2018  PT End of Session - 05/22/18 1354    Visit Number  2    Number of Visits  12    Date for PT Re-Evaluation  06/29/18    PT Start Time  6734    PT Stop Time  1436    PT Time Calculation (min)  48 min    Activity Tolerance  Patient tolerated treatment well    Behavior During Therapy  [facility] for tasks assessed/performed       Past Medical History:  Diagnosis Date  . Acute meniscal tear of left knee   . Allergic rhinitis   . BPH (benign prostatic hypertrophy)   . Cervical spondylosis without myelopathy   . Cervicogenic headache   . Chronic neck pain    uses TENS unit  prn  . Diverticulitis   . GERD (gastroesophageal reflux disease)   . H/O hiatal hernia   . History of diverticulitis   . History of kidney stones   . History of TIA (transient ischemic attack)    04/ 2011---  no residuals  . Hypertension   . Hypothyroidism   . Peripheral neuropathy   . Shingles   . Syrinx of spinal cord ([place])    C4 -- C7    Past Surgical History:  Procedure Laterality Date  . CARDIAC CATHETERIZATION  02-18-2010   dr Angelena Form   normal coronary arteries/  normal lvsf--  ef 65%  . COLONOSCOPY    . [place]  . KNEE ARTHROSCOPY Left 03/13/2014   Procedure: LEFT ARTHROSCOPY KNEE WITH DEBRIDMENT;  Surgeon: Gearlean Alf, MD;  Location: [facility];  Service: Orthopedics;  Laterality: Left;  . LAPAROSCOPIC CHOLECYSTECTOMY  01-30-2003  . NEGATIVE SLEEP STUDY  2013   per pt  . RADIOFREQUENCY ABLATION NERVES  05/2016  . REMOVAL FORGEIN BODY FROM EAR  AGE 67  . RIGHT URETEROSCOPIC LASER LITHOTRIPSY STONE EXTRACTION /  STENT PLACEMENT  05-10-2000  .  TRANSTHORACIC ECHOCARDIOGRAM  01-16-2010   normal lvf/  ef  60%/  mild lae    There were no vitals filed for this visit.  Subjective Assessment - 05/22/18 1350    Subjective  Patient presented with 5/10 in thoracic spine predominately in R posterior ribcage. Reports that just scrambling eggs this morning caused pain.    Pertinent History  Peripheral neuropathy; headaches; left knee scope; neck pain; TIA.    Patient Stated Goals  Lift grandchildren with 67 pain.    Currently in Pain?  Yes    Pain Score  5     Pain Location  Back    Pain Orientation  Right;Mid    Pain Descriptors / Indicators  Discomfort    Pain Type  Chronic pain    Pain Onset  More than a month ago    Pain Frequency  Constant    Aggravating Factors   OH work         [facility] PT Assessment - 05/22/18 0001      Assessment   Medical Diagnosis  Lumbar radiculopathy.    Next MD Visit  After PT      Precautions   Precautions  Fall      Restrictions   Weight  Bearing Restrictions  No                   [facility] Adult PT Treatment/Exercise - 05/22/18 0001      Exercises   Exercises  Shoulder;Lumbar      Lumbar Exercises: Aerobic   UBE (Upper Arm Bike)  120 rpm x4 min     Nustep  L5 x14 min      Lumbar Exercises: Supine   Bent Knee Raise  20 reps    Bridge  15 reps;3 seconds    Straight Leg Raise  20 reps      Modalities   Modalities  Electrical Stimulation;Moist Heat      Moist Heat Therapy   Number Minutes Moist Heat  15 Minutes    Moist Heat Location  Other (comment)   Thoracic spine     Electrical Stimulation   Electrical Stimulation Location  B thoracic paraspinals    Electrical Stimulation Action  IFC    Electrical Stimulation Parameters  80-150 hz x15 min    Electrical Stimulation Goals  Pain                  PT Long Term Goals - 05/18/18 1916      PT LONG TERM GOAL #1   Title  Independent with a HEP.    Time  6    Period  Weeks    Status  New      PT LONG TERM  GOAL #2   Title  Perform ADL's with pain not > 4/10.    Time  6    Period  Weeks    Status  New      PT LONG TERM GOAL #3   Title  Lift grandchildren with pain not > 4/10.    Time  6    Period  Weeks    Status  New            Plan - 05/22/18 1425    Clinical Impression Statement  Patient presented today with 5/10 pain especially around scapula/posterior ribcage. Patient guided through warm up with Nustep as well as UBE but following UBE he reported greater ribcage pain. Patient instructed with core activation technique for other supine exercises. No complaints of any extreme increased pain or discomfort with supine core exercises. Patient reported stretcing multiple times per day to ease pain and that moist heat helps alleviate pain. Normal modalities response noted following removal of the modalities.    Rehab Potential  Fair    PT Frequency  2x / week    PT Duration  6 weeks    PT Treatment/Interventions  ADLs/Self Care Home Management;Cryotherapy;Electrical Stimulation;Ultrasound;Traction;Moist Heat;Therapeutic activities;Therapeutic exercise;Balance training;Neuromuscular re-education;Patient/family education;Manual techniques    PT Next Visit Plan  IFC and HMP; core exercises; Combo e'stim/U/S and STW/M.  FALL RISK.    Consulted and Agree with Plan of Care  Patient       Patient will benefit from skilled therapeutic intervention in order to improve the following deficits and impairments:  Abnormal gait, Decreased activity tolerance, Decreased balance, Pain  Visit Diagnosis: Pain in thoracic spine     Problem List Patient Active Problem List   Diagnosis Date Noted  . Anxiety 10/04/2017  . Nonallopathic lesion of rib cage 06/15/2017  . Nonallopathic lesion of cervical region 05/18/2017  . Nonallopathic lesion of thoracic region 05/18/2017  . Nonallopathic lesion of lumbosacral region 05/18/2017  . Arrhythmia 10/08/2014  . TIA (transient ischemic attack) 08/09/2014   .  Lateral meniscal tear 03/12/2014  . Testosterone deficiency 07/04/2013  . Cervicogenic headache 03/28/2013  . Vitamin D deficiency 01/01/2013  . Hyperlipidemia 01/01/2013  . BPH (benign prostatic hyperplasia) 11/03/2012  . Diverticulosis large intestine w/o perforation or abscess w/bleeding 11/03/2012  . Peripheral neuropathy 11/03/2012  . Unspecified hypothyroidism 01/18/2011  . Essential hypertension 01/18/2011  . GERD (gastroesophageal reflux disease) 01/18/2011  . Cervical spondylosis without myelopathy 11/24/2010  . Headache(784.0) 11/24/2010  . Sleep disturbance, unspecified 11/24/2010  . Syringomyelia and syringobulbia ([place]) 11/24/2010  . Disturbance of skin sensation 11/24/2010  . Cerebrovascular disease, unspecified 11/24/2010    Standley Brooking, PTA 05/22/2018, 3:10 PM  [facility] [address] [place], [place], [address] Phone: [phone]   Fax:  [phone]  Name: BERGEN MAGNER MRN: [id number] Date of Birth: 09/25/1951

## 2018-05-25 ENCOUNTER — Encounter: Payer: Self-pay | Admitting: Physical Therapy

## 2018-05-25 ENCOUNTER — Ambulatory Visit: Payer: Medicare Other | Admitting: Physical Therapy

## 2018-05-25 DIAGNOSIS — M546 Pain in thoracic spine: Secondary | ICD-10-CM | POA: Diagnosis not present

## 2018-05-25 NOTE — Therapy (Signed)
[facility] [address] [place], [place], [address] Phone: [phone]   Fax:  [phone]  Physical Therapy Treatment  Patient Details  Name: Jacob Kerr MRN: [id number] Date of Birth: [birth year] Referring Provider: Antoine Primas    Encounter Date: 05/25/2018  PT End of Session - 05/25/18 1442    Visit Number  3    Number of Visits  12    Date for PT Re-Evaluation  06/29/18    PT Start Time  1344    PT Stop Time  1455    PT Time Calculation (min)  71 min    Activity Tolerance  Patient tolerated treatment well    Behavior During Therapy  [facility] for tasks assessed/performed       Past Medical History:  Diagnosis Date  . Acute meniscal tear of left knee   . Allergic rhinitis   . BPH (benign prostatic hypertrophy)   . Cervical spondylosis without myelopathy   . Cervicogenic headache   . Chronic neck pain    uses TENS unit  prn  . Diverticulitis   . GERD (gastroesophageal reflux disease)   . H/O hiatal hernia   . History of diverticulitis   . History of kidney stones   . History of TIA (transient ischemic attack)    04/ 2011---  no residuals  . Hypertension   . Hypothyroidism   . Peripheral neuropathy   . Shingles   . Syrinx of spinal cord (HCC)    C4 -- C7    Past Surgical History:  Procedure Laterality Date  . CARDIAC CATHETERIZATION  02-18-2010   dr Clifton James   normal coronary arteries/  normal lvsf--  ef 65%  . COLONOSCOPY    . HEMORRHOID SURGERY  1992  . KNEE ARTHROSCOPY Left 03/13/2014   Procedure: LEFT ARTHROSCOPY KNEE WITH DEBRIDMENT;  Surgeon: Loanne Drilling, MD;  Location: [facility];  Service: Orthopedics;  Laterality: Left;  . LAPAROSCOPIC CHOLECYSTECTOMY  01-30-2003  . NEGATIVE SLEEP STUDY  2013   per pt  . RADIOFREQUENCY ABLATION NERVES  05/2016  . REMOVAL FORGEIN BODY FROM EAR  AGE 67  . RIGHT URETEROSCOPIC LASER LITHOTRIPSY STONE EXTRACTION /  STENT PLACEMENT  05-10-2000  .  TRANSTHORACIC ECHOCARDIOGRAM  01-16-2010   normal lvf/  ef  60%/  mild lae    There were no vitals filed for this visit.  Subjective Assessment - 05/25/18 1347    Subjective  Patient arrived feeling "same"    Pertinent History  Peripheral neuropathy; headaches; left knee scope; neck pain; TIA.    Patient Stated Goals  Lift grandchildren with less pain.    Currently in Pain?  Yes    Pain Score  5     Pain Location  Back    Pain Orientation  Right;Mid    Pain Descriptors / Indicators  Discomfort    Pain Type  Chronic pain    Pain Onset  More than a month ago    Pain Frequency  Intermittent    Aggravating Factors   OH work    Pain Relieving Factors  rest                       [facility] Adult PT Treatment/Exercise - 05/25/18 0001      Exercises   Exercises  Shoulder;Lumbar      Lumbar Exercises: Aerobic   Nustep  L5 x12 min UE/LE monitored      Lumbar Exercises: Standing  Scapular Retraction  Strengthening;Both;20 reps;Theraband;Limitations;10 reps    Theraband Level (Scapular Retraction)  Other (comment)    Scapular Retraction Limitations  pink XTS    Shoulder Extension  Strengthening;10 reps;20 reps;Theraband;Limitations    Theraband Level (Shoulder Extension)  Other (comment)    Shoulder Extension Limitations  pink XTS      Lumbar Exercises: Supine   Bent Knee Raise  20 reps    Bridge with clamshell  20 reps;3 seconds    Straight Leg Raise  20 reps      Moist Heat Therapy   Number Minutes Moist Heat  20 Minutes    Moist Heat Location  Other (comment)   thoracic     Electrical Stimulation   Electrical Stimulation Location  thoracic RT    Electrical Stimulation Action  IFC    Electrical Stimulation Parameters  80-150hz     Electrical Stimulation Goals  Pain      Manual Therapy   Manual Therapy  Myofascial release;Soft tissue mobilization    Manual therapy comments  gentle STW/MFR to right scapula mid thoracic and flank to decrease pain and  tightness                  PT Long Term Goals - 05/25/18 1444      PT LONG TERM GOAL #1   Title  Independent with a HEP.    Time  6    Period  Weeks    Status  On-going      PT LONG TERM GOAL #2   Title  Perform ADL's with pain not > 4/10.    Time  6    Period  Weeks    Status  On-going   no change 05/25/18     PT LONG TERM GOAL #3   Title  Lift grandchildren with pain not > 4/10.    Time  6    Period  Weeks    Status  On-going   no change 05/25/18           Plan - 05/25/18 1444    Clinical Impression Statement  Patient tolerated treatment well today. Patient understands the importance of posture techniques and core actvation exercises. Patient reported no worse symptomes at this time. Patient has tightness in right scapular boarder and palpable pain mid right thoracic area. Patient goals ongoing at this time.     Rehab Potential  Fair    PT Frequency  2x / week    PT Duration  6 weeks    PT Treatment/Interventions  ADLs/Self Care Home Management;Cryotherapy;Electrical Stimulation;Ultrasound;Traction;Moist Heat;Therapeutic activities;Therapeutic exercise;Balance training;Neuromuscular re-education;Patient/family education;Manual techniques    PT Next Visit Plan  IFC and HMP; core exercises; Combo e'stim/U/S and STW/M.  FALL RISK.    Consulted and Agree with Plan of Care  Patient       Patient will benefit from skilled therapeutic intervention in order to improve the following deficits and impairments:  Abnormal gait, Decreased activity tolerance, Decreased balance, Pain  Visit Diagnosis: Pain in thoracic spine     Problem List Patient Active Problem List   Diagnosis Date Noted  . Anxiety 10/04/2017  . Nonallopathic lesion of rib cage 06/15/2017  . Nonallopathic lesion of cervical region 05/18/2017  . Nonallopathic lesion of thoracic region 05/18/2017  . Nonallopathic lesion of lumbosacral region 05/18/2017  . Arrhythmia 10/08/2014  . TIA  (transient ischemic attack) 08/09/2014  . Lateral meniscal tear 03/12/2014  . Testosterone deficiency 07/04/2013  . Cervicogenic headache 03/28/2013  .  Vitamin D deficiency 01/01/2013  . Hyperlipidemia 01/01/2013  . BPH (benign prostatic hyperplasia) 11/03/2012  . Diverticulosis large intestine w/o perforation or abscess w/bleeding 11/03/2012  . Peripheral neuropathy 11/03/2012  . Unspecified hypothyroidism 01/18/2011  . Essential hypertension 01/18/2011  . GERD (gastroesophageal reflux disease) 01/18/2011  . Cervical spondylosis without myelopathy 11/24/2010  . Headache(784.0) 11/24/2010  . Sleep disturbance, unspecified 11/24/2010  . Syringomyelia and syringobulbia (HCC) 11/24/2010  . Disturbance of skin sensation 11/24/2010  . Cerebrovascular disease, unspecified 11/24/2010    Cherry Turlington P, PTA 05/25/2018, 3:01 PM  [facility] [address] [place], [place], [address] Phone: [phone]   Fax:  [phone]  Name: RUY DUNKELBERGER MRN: [id number] Date of Birth: 20-Aug-1951

## 2018-05-26 ENCOUNTER — Other Ambulatory Visit: Payer: Self-pay | Admitting: *Deleted

## 2018-05-26 ENCOUNTER — Encounter: Payer: Self-pay | Admitting: Family Medicine

## 2018-05-26 MED ORDER — AMLODIPINE BESYLATE 10 MG PO TABS: 10.0000 mg | ORAL_TABLET | Freq: Every day | ORAL | 0 refills | Status: DC

## 2018-05-26 MED ORDER — LOSARTAN POTASSIUM 50 MG PO TABS: 50.0000 mg | ORAL_TABLET | Freq: Every day | ORAL | 0 refills | Status: DC

## 2018-05-31 ENCOUNTER — Ambulatory Visit: Payer: Medicare Other | Attending: Family Medicine | Admitting: Physical Therapy

## 2018-05-31 ENCOUNTER — Encounter: Payer: Self-pay | Admitting: Physical Therapy

## 2018-05-31 ENCOUNTER — Encounter: Payer: Self-pay | Admitting: *Deleted

## 2018-05-31 DIAGNOSIS — M546 Pain in thoracic spine: Secondary | ICD-10-CM

## 2018-05-31 NOTE — Therapy (Signed)
or abscess w/bleeding 11/03/2012  . Peripheral neuropathy 11/03/2012  . Unspecified hypothyroidism 01/18/2011  . Essential hypertension 01/18/2011  . GERD (gastroesophageal reflux disease) 01/18/2011  . Cervical spondylosis without myelopathy 11/24/2010  . Headache(784.0) 11/24/2010  . Sleep disturbance, unspecified 11/24/2010  . Syringomyelia and syringobulbia ([place]) 11/24/2010  . Disturbance of skin sensation 11/24/2010  . Cerebrovascular disease, unspecified 11/24/2010    Elysa Womac P, PTA 05/31/2018, 2:21 PM  [facility] [address] [place], [place], [address] Phone: [phone]   Fax:  [phone]  Name: Jacob Kerr MRN: [id number] Date of Birth: January 18, 1951  [facility] [place], [place], [address] Phone: [phone]   Fax:  [phone]  Physical Therapy Treatment  Patient Details  Name: Jacob Kerr MRN: [id number] Date of Birth: 1951/01/23 Referring Provider: Hulan Saas    Encounter Date: 05/31/2018  PT End of Session - 05/31/18 1400    Visit Number  4    Number of Visits  12    Date for PT Re-Evaluation  06/29/18    PT Start Time  1320    PT Stop Time  1419    PT Time Calculation (min)  59 min    Activity Tolerance  Patient tolerated treatment well    Behavior During Therapy  [employer] for tasks assessed/performed       Past Medical History:  Diagnosis Date  . Acute meniscal tear of left knee   . Allergic rhinitis   . BPH (benign prostatic hypertrophy)   . Cervical spondylosis without myelopathy   . Cervicogenic headache   . Chronic neck pain    uses TENS unit  prn  . Diverticulitis   . GERD (gastroesophageal reflux disease)   . H/O hiatal hernia   . History of diverticulitis   . History of kidney stones   . History of TIA (transient ischemic attack)    04/ 2011---  no residuals  . Hypertension   . Hypothyroidism   . Peripheral neuropathy   . Shingles   . Syrinx of spinal cord ([place])    C4 -- C7    Past Surgical History:  Procedure Laterality Date  . CARDIAC CATHETERIZATION  02-18-2010   dr Angelena Form   normal coronary arteries/  normal lvsf--  ef 65%  . COLONOSCOPY    . [place]  . KNEE ARTHROSCOPY Left 03/13/2014   Procedure: LEFT ARTHROSCOPY KNEE WITH DEBRIDMENT;  Surgeon: Gearlean Alf, MD;  Location: [facility];  Service: Orthopedics;  Laterality: Left;  . LAPAROSCOPIC CHOLECYSTECTOMY  01-30-2003  . NEGATIVE SLEEP STUDY  2013   per pt  . RADIOFREQUENCY ABLATION NERVES  05/2016  . REMOVAL FORGEIN BODY FROM EAR  AGE 6  . RIGHT URETEROSCOPIC LASER LITHOTRIPSY STONE EXTRACTION /  STENT PLACEMENT  05-10-2000  .  TRANSTHORACIC ECHOCARDIOGRAM  01-16-2010   normal lvf/  ef  60%/  mild lae    There were no vitals filed for this visit.  Subjective Assessment - 05/31/18 1334    Subjective  Patient arrived feeling the "same" thus far    Pertinent History  Peripheral neuropathy; headaches; left knee scope; neck pain; TIA.    Patient Stated Goals  Lift grandchildren with less pain.    Currently in Pain?  Yes    Pain Score  5     Pain Location  Back    Pain Orientation  Right;Mid    Pain Descriptors / Indicators  Discomfort    Pain Type  Chronic pain    Pain Onset  More than a month ago    Pain Frequency  Intermittent    Aggravating Factors   OH work or any prolong activity    Pain Relieving Factors  at rest                       [facility] Adult PT Treatment/Exercise - 05/31/18 0001      Lumbar Exercises: Aerobic   Nustep  L5-7 x 54min UE/LE, monitored      Lumbar Exercises: Standing   Scapular  [facility] [place], [place], [address] Phone: [phone]   Fax:  [phone]  Physical Therapy Treatment  Patient Details  Name: Jacob Kerr MRN: [id number] Date of Birth: 1951/01/23 Referring Provider: Hulan Saas    Encounter Date: 05/31/2018  PT End of Session - 05/31/18 1400    Visit Number  4    Number of Visits  12    Date for PT Re-Evaluation  06/29/18    PT Start Time  1320    PT Stop Time  1419    PT Time Calculation (min)  59 min    Activity Tolerance  Patient tolerated treatment well    Behavior During Therapy  [employer] for tasks assessed/performed       Past Medical History:  Diagnosis Date  . Acute meniscal tear of left knee   . Allergic rhinitis   . BPH (benign prostatic hypertrophy)   . Cervical spondylosis without myelopathy   . Cervicogenic headache   . Chronic neck pain    uses TENS unit  prn  . Diverticulitis   . GERD (gastroesophageal reflux disease)   . H/O hiatal hernia   . History of diverticulitis   . History of kidney stones   . History of TIA (transient ischemic attack)    04/ 2011---  no residuals  . Hypertension   . Hypothyroidism   . Peripheral neuropathy   . Shingles   . Syrinx of spinal cord ([place])    C4 -- C7    Past Surgical History:  Procedure Laterality Date  . CARDIAC CATHETERIZATION  02-18-2010   dr Angelena Form   normal coronary arteries/  normal lvsf--  ef 65%  . COLONOSCOPY    . [place]  . KNEE ARTHROSCOPY Left 03/13/2014   Procedure: LEFT ARTHROSCOPY KNEE WITH DEBRIDMENT;  Surgeon: Gearlean Alf, MD;  Location: [facility];  Service: Orthopedics;  Laterality: Left;  . LAPAROSCOPIC CHOLECYSTECTOMY  01-30-2003  . NEGATIVE SLEEP STUDY  2013   per pt  . RADIOFREQUENCY ABLATION NERVES  05/2016  . REMOVAL FORGEIN BODY FROM EAR  AGE 6  . RIGHT URETEROSCOPIC LASER LITHOTRIPSY STONE EXTRACTION /  STENT PLACEMENT  05-10-2000  .  TRANSTHORACIC ECHOCARDIOGRAM  01-16-2010   normal lvf/  ef  60%/  mild lae    There were no vitals filed for this visit.  Subjective Assessment - 05/31/18 1334    Subjective  Patient arrived feeling the "same" thus far    Pertinent History  Peripheral neuropathy; headaches; left knee scope; neck pain; TIA.    Patient Stated Goals  Lift grandchildren with less pain.    Currently in Pain?  Yes    Pain Score  5     Pain Location  Back    Pain Orientation  Right;Mid    Pain Descriptors / Indicators  Discomfort    Pain Type  Chronic pain    Pain Onset  More than a month ago    Pain Frequency  Intermittent    Aggravating Factors   OH work or any prolong activity    Pain Relieving Factors  at rest                       [facility] Adult PT Treatment/Exercise - 05/31/18 0001      Lumbar Exercises: Aerobic   Nustep  L5-7 x 54min UE/LE, monitored      Lumbar Exercises: Standing   Scapular

## 2018-06-06 ENCOUNTER — Encounter: Payer: Self-pay | Admitting: Physical Therapy

## 2018-06-06 ENCOUNTER — Ambulatory Visit: Payer: Medicare Other | Admitting: Physical Therapy

## 2018-06-06 DIAGNOSIS — M546 Pain in thoracic spine: Secondary | ICD-10-CM

## 2018-06-06 NOTE — Therapy (Signed)
[facility] [place], [place], [address] Phone: [phone]   Fax:  [phone]  Physical Therapy Treatment  Patient Details  Name: Jacob Kerr MRN: [id number] Date of Birth: May 17, 1951 Referring Provider: Hulan Saas    Encounter Date: 06/06/2018  PT End of Session - 06/06/18 1408    Visit Number  5    Number of Visits  12    Date for PT Re-Evaluation  06/29/18    PT Start Time  2025    PT Stop Time  1449    PT Time Calculation (min)  50 min    Activity Tolerance  Patient tolerated treatment well    Behavior During Therapy  [facility] for tasks assessed/performed       Past Medical History:  Diagnosis Date  . Acute meniscal tear of left knee   . Allergic rhinitis   . BPH (benign prostatic hypertrophy)   . Cervical spondylosis without myelopathy   . Cervicogenic headache   . Chronic neck pain    uses TENS unit  prn  . Diverticulitis   . GERD (gastroesophageal reflux disease)   . H/O hiatal hernia   . History of diverticulitis   . History of kidney stones   . History of TIA (transient ischemic attack)    04/ 2011---  no residuals  . Hypertension   . Hypothyroidism   . Peripheral neuropathy   . Shingles   . Syrinx of spinal cord ([place])    C4 -- C7    Past Surgical History:  Procedure Laterality Date  . CARDIAC CATHETERIZATION  02-18-2010   dr Angelena Form   normal coronary arteries/  normal lvsf--  ef 65%  . COLONOSCOPY    . [place]  . KNEE ARTHROSCOPY Left 03/13/2014   Procedure: LEFT ARTHROSCOPY KNEE WITH DEBRIDMENT;  Surgeon: Gearlean Alf, MD;  Location: [facility];  Service: Orthopedics;  Laterality: Left;  . LAPAROSCOPIC CHOLECYSTECTOMY  01-30-2003  . NEGATIVE SLEEP STUDY  2013   per pt  . RADIOFREQUENCY ABLATION NERVES  05/2016  . REMOVAL FORGEIN BODY FROM EAR  AGE 34  . RIGHT URETEROSCOPIC LASER LITHOTRIPSY STONE EXTRACTION /  STENT PLACEMENT  05-10-2000  .  TRANSTHORACIC ECHOCARDIOGRAM  01-16-2010   normal lvf/  ef  60%/  mild lae    There were no vitals filed for this visit.  Subjective Assessment - 06/06/18 1404    Subjective  Reports that he has been working on his bulldozer today but is still having pain in R rib cage and in low back. Reports that since beginning PT he has not lost bowel control.    Pertinent History  Peripheral neuropathy; headaches; left knee scope; neck pain; TIA.    Patient Stated Goals  Lift grandchildren with less pain.    Currently in Pain?  Yes    Pain Score  7     Pain Location  Back    Pain Orientation  Right;Mid    Pain Descriptors / Indicators  Discomfort    Pain Type  Chronic pain    Pain Onset  More than a month ago         [facility] PT Assessment - 06/06/18 0001      Assessment   Medical Diagnosis  Lumbar radiculopathy.    Next MD Visit  After PT      Precautions   Precautions  Fall      Restrictions   Weight Bearing Restrictions  No                   [facility] Adult PT Treatment/Exercise - 06/06/18 0001      Exercises   Exercises  Shoulder;Lumbar      Lumbar Exercises: Stretches   Other Lumbar Stretch Exercise  Sink stretch 5 reps x20 sec      Lumbar Exercises: Aerobic   Nustep  L5 x15 min      Lumbar Exercises: Standing   Scapular Retraction  Strengthening;Both;20 reps;Theraband;Limitations;10 reps    Theraband Level (Scapular Retraction)  Other (comment)    Scapular Retraction Limitations  Orange XTS    Shoulder Extension  Strengthening;10 reps;20 reps;Theraband;Limitations    Theraband Level (Shoulder Extension)  Other (comment)    Shoulder Extension Limitations  Orange XTS    Other Standing Lumbar Exercises  Thoracic rotation at wall red theraband x20 reps      Lumbar Exercises: Supine   Bridge  20 reps      Modalities   Modalities  Electrical Stimulation;Moist Heat      Moist Heat Therapy   Number Minutes Moist Heat  15 Minutes    Moist Heat Location  Lumbar  Spine   and thoracic spine     Electrical Stimulation   Electrical Stimulation Location  Thoracic paraspinals    Electrical Stimulation Action  IFC    Electrical Stimulation Parameters  80-150 hz x15 min    Electrical Stimulation Goals  Pain                  PT Long Term Goals - 06/06/18 1410      PT LONG TERM GOAL #1   Title  Independent with a HEP.    Time  6    Period  Weeks    Status  On-going      PT LONG TERM GOAL #2   Title  Perform ADL's with pain not > 4/10.    Time  6    Period  Weeks    Status  On-going   no change 05/25/18     PT LONG TERM GOAL #3   Title  Lift grandchildren with pain not > 4/10.    Time  6    Period  Weeks    Status  On-going   7-9/10 at times per patient report 06/06/2018           Plan - 06/06/18 1440    Clinical Impression Statement  Patient tolerated today's treatment fairly well with 7/10 thoracic spine. Patient presented in clinic with continued reports of limitation with activities around his home due to pain and with burning under R scapula. Patient guided through more postural strengthening exercises with patient only requiring rest break during resisted rows due to pain for approximately 1 minute and then returned exercise. No complaints of any pain during lumbar bridges as long as UEs rested on table. Normal modalities response noted following removal of the modalities.    Rehab Potential  Fair    PT Frequency  2x / week    PT Duration  6 weeks    PT Treatment/Interventions  ADLs/Self Care Home Management;Cryotherapy;Electrical Stimulation;Ultrasound;Traction;Moist Heat;Therapeutic activities;Therapeutic exercise;Balance training;Neuromuscular re-education;Patient/family education;Manual techniques    PT Next Visit Plan  IFC and HMP; core exercises; Combo e'stim/U/S and STW/M.  FALL RISK.    Consulted and Agree with Plan of Care  Patient       Patient will benefit from skilled therapeutic intervention in order to  improve  the following deficits and impairments:  Abnormal gait, Decreased activity tolerance, Decreased balance, Pain  Visit Diagnosis: Pain in thoracic spine     Problem List Patient Active Problem List   Diagnosis Date Noted  . Anxiety 10/04/2017  . Nonallopathic lesion of rib cage 06/15/2017  . Nonallopathic lesion of cervical region 05/18/2017  . Nonallopathic lesion of thoracic region 05/18/2017  . Nonallopathic lesion of lumbosacral region 05/18/2017  . Arrhythmia 10/08/2014  . TIA (transient ischemic attack) 08/09/2014  . Lateral meniscal tear 03/12/2014  . Testosterone deficiency 07/04/2013  . Cervicogenic headache 03/28/2013  . Vitamin D deficiency 01/01/2013  . Hyperlipidemia 01/01/2013  . BPH (benign prostatic hyperplasia) 11/03/2012  . Diverticulosis large intestine w/o perforation or abscess w/bleeding 11/03/2012  . Peripheral neuropathy 11/03/2012  . Unspecified hypothyroidism 01/18/2011  . Essential hypertension 01/18/2011  . GERD (gastroesophageal reflux disease) 01/18/2011  . Cervical spondylosis without myelopathy 11/24/2010  . Headache(784.0) 11/24/2010  . Sleep disturbance, unspecified 11/24/2010  . Syringomyelia and syringobulbia ([place]) 11/24/2010  . Disturbance of skin sensation 11/24/2010  . Cerebrovascular disease, unspecified 11/24/2010    Standley Brooking, PTA 06/06/2018, 3:02 PM  [facility] [address] [place], [place], [address] Phone: [phone]   Fax:  [phone]  Name: LAVANCE BEAZER MRN: [id number] Date of Birth: 11/24/1950

## 2018-06-08 ENCOUNTER — Encounter: Payer: Medicare Other | Admitting: Physical Therapy

## 2018-06-09 ENCOUNTER — Other Ambulatory Visit: Payer: Self-pay

## 2018-06-09 ENCOUNTER — Ambulatory Visit (INDEPENDENT_AMBULATORY_CARE_PROVIDER_SITE_OTHER): Payer: Medicare Other | Admitting: Family Medicine

## 2018-06-09 ENCOUNTER — Emergency Department (HOSPITAL_COMMUNITY): Payer: Medicare Other

## 2018-06-09 ENCOUNTER — Emergency Department (HOSPITAL_COMMUNITY)
Admission: EM | Admit: 2018-06-09 | Discharge: 2018-06-09 | Disposition: A | Discharge status: Home / Self Care | Payer: Medicare Other | Attending: Emergency Medicine | Admitting: Emergency Medicine

## 2018-06-09 ENCOUNTER — Encounter: Payer: Medicare Other | Admitting: Physical Therapy

## 2018-06-09 ENCOUNTER — Encounter (HOSPITAL_COMMUNITY): Payer: Self-pay | Admitting: *Deleted

## 2018-06-09 VITALS — BP 134/86 | HR 81 | Temp 98.2°F | Ht 73.0 in | Wt 197.0 lb

## 2018-06-09 DIAGNOSIS — R2681 Unsteadiness on feet: Secondary | ICD-10-CM | POA: Insufficient documentation

## 2018-06-09 DIAGNOSIS — R29810 Facial weakness: Secondary | ICD-10-CM | POA: Insufficient documentation

## 2018-06-09 DIAGNOSIS — Z79899 Other long term (current) drug therapy: Secondary | ICD-10-CM | POA: Diagnosis not present

## 2018-06-09 DIAGNOSIS — R42 Dizziness and giddiness: Secondary | ICD-10-CM | POA: Diagnosis not present

## 2018-06-09 DIAGNOSIS — E039 Hypothyroidism, unspecified: Secondary | ICD-10-CM | POA: Insufficient documentation

## 2018-06-09 DIAGNOSIS — R200 Anesthesia of skin: Secondary | ICD-10-CM

## 2018-06-09 DIAGNOSIS — M546 Pain in thoracic spine: Secondary | ICD-10-CM | POA: Diagnosis not present

## 2018-06-09 DIAGNOSIS — I10 Essential (primary) hypertension: Secondary | ICD-10-CM | POA: Diagnosis not present

## 2018-06-09 DIAGNOSIS — R531 Weakness: Secondary | ICD-10-CM | POA: Diagnosis not present

## 2018-06-09 DIAGNOSIS — Z8673 Personal history of transient ischemic attack (TIA), and cerebral infarction without residual deficits: Secondary | ICD-10-CM | POA: Insufficient documentation

## 2018-06-09 DIAGNOSIS — Z7982 Long term (current) use of aspirin: Secondary | ICD-10-CM | POA: Insufficient documentation

## 2018-06-09 DIAGNOSIS — M6281 Muscle weakness (generalized): Secondary | ICD-10-CM | POA: Diagnosis not present

## 2018-06-09 DIAGNOSIS — M549 Dorsalgia, unspecified: Secondary | ICD-10-CM | POA: Diagnosis not present

## 2018-06-09 LAB — COMPREHENSIVE METABOLIC PANEL
ALK PHOS: 103 U/L (ref 38–126)
ALT: 33 U/L (ref 0–44)
ANION GAP: 10 (ref 5–15)
AST: 26 U/L (ref 15–41)
Albumin: 4 g/dL (ref 3.5–5.0)
BILIRUBIN TOTAL: 1.4 mg/dL — AB (ref 0.3–1.2)
BUN: 12 mg/dL (ref 8–23)
CALCIUM: 9 mg/dL (ref 8.9–10.3)
CO2: 20 mmol/L — ABNORMAL LOW (ref 22–32)
CREATININE: 1.43 mg/dL — AB (ref 0.61–1.24)
Chloride: 108 mmol/L (ref 98–111)
GFR calc non Af Amer: 50 mL/min — ABNORMAL LOW (ref >60)
GFR, EST AFRICAN AMERICAN: 57 mL/min — AB (ref >60)
Glucose, Bld: 112 mg/dL — ABNORMAL HIGH (ref 70–99)
Potassium: 3.5 mmol/L (ref 3.5–5.1)
SODIUM: 138 mmol/L (ref 135–145)
TOTAL PROTEIN: 6.5 g/dL (ref 6.5–8.1)

## 2018-06-09 LAB — DIFFERENTIAL
Abs Immature Granulocytes: 0 10*3/uL (ref 0.0–0.1)
Basophils Absolute: 0.1 10*3/uL (ref 0.0–0.1)
Basophils Relative: 1 %
EOS PCT: 3 %
Eosinophils Absolute: 0.2 10*3/uL (ref 0.0–0.7)
Immature Granulocytes: 0 %
LYMPHS PCT: 24 %
Lymphs Abs: 1.4 10*3/uL (ref 0.7–4.0)
MONO ABS: 0.7 10*3/uL (ref 0.1–1.0)
Monocytes Relative: 12 %
NEUTROS ABS: 3.7 10*3/uL (ref 1.7–7.7)
NEUTROS PCT: 60 %

## 2018-06-09 LAB — CBC
HCT: 50.9 % (ref 39.0–52.0)
Hemoglobin: 17.6 g/dL — ABNORMAL HIGH (ref 13.0–17.0)
MCH: 33.7 pg (ref 26.0–34.0)
MCHC: 34.6 g/dL (ref 30.0–36.0)
MCV: 97.5 fL (ref 78.0–100.0)
PLATELETS: 222 10*3/uL (ref 150–400)
RBC: 5.22 MIL/uL (ref 4.22–5.81)
RDW: 12.3 % (ref 11.5–15.5)
WBC: 6.1 10*3/uL (ref 4.0–10.5)

## 2018-06-09 LAB — I-STAT TROPONIN, ED: TROPONIN I, POC: 0 ng/mL (ref 0.00–0.08)

## 2018-06-09 LAB — PROTIME-INR
INR: 1.01
PROTHROMBIN TIME: 13.2 s (ref 11.4–15.2)

## 2018-06-09 LAB — APTT: aPTT: 28 seconds (ref 24–36)

## 2018-06-09 LAB — I-STAT CHEM 8, ED
BUN: 12 mg/dL (ref 8–23)
CALCIUM ION: 1.17 mmol/L (ref 1.15–1.40)
CHLORIDE: 107 mmol/L (ref 98–111)
Creatinine, Ser: 1.4 mg/dL — ABNORMAL HIGH (ref 0.61–1.24)
GLUCOSE: 108 mg/dL — AB (ref 70–99)
HCT: 52 % (ref 39.0–52.0)
HEMOGLOBIN: 17.7 g/dL — AB (ref 13.0–17.0)
Potassium: 3.4 mmol/L — ABNORMAL LOW (ref 3.5–5.1)
SODIUM: 139 mmol/L (ref 135–145)
TCO2: 20 mmol/L — ABNORMAL LOW (ref 22–32)

## 2018-06-09 MED ORDER — PREDNISONE 10 MG (21) PO TBPK: ORAL_TABLET | Freq: Every day | ORAL | 0 refills | Status: DC

## 2018-06-09 NOTE — ED Triage Notes (Addendum)
Pt in c/o intermittent weakness and tingling, primarily to the right arm and leg, also to central torso and face- symptoms first started appearing in 2015 but would not last long, in the last three weeks symptoms have started appearing more frequently and lasting longer- pt has a spinal syrinx and is being treated by Dr. Tamala Julian for this, they are working on getting him into a clinical trial at [facility] for these symptoms- pt in today due to increased length of symptoms and increased pain under his right shoulder blade and headache

## 2018-06-09 NOTE — ED Provider Notes (Signed)
[facility] EMERGENCY DEPARTMENT Provider Note   CSN: [id number] Arrival date & time: 06/09/18  1237     History   Chief Complaint Chief Complaint  Patient presents with  . Weakness    HPI Jacob Kerr is a 67 y.o. male.  The history is provided by the patient and a relative.  Weakness  Primary symptoms include loss of sensation (numbness over chin), loss of balance (difficulty walking, keeps veering off to the right).  Primary symptoms include no focal weakness, no speech change. This is a new problem. The current episode started 6 to 12 hours ago. The problem has not changed since onset.There was no focality noted. There has been no fever. Pertinent negatives include no shortness of breath, no chest pain, no vomiting, no altered mental status, no confusion and no headaches. Associated medical issues comments: syrinx.    Past Medical History:  Diagnosis Date  . Acute meniscal tear of left knee   . Allergic rhinitis   . BPH (benign prostatic hypertrophy)   . Cervical spondylosis without myelopathy   . Cervicogenic headache   . Chronic neck pain    uses TENS unit  prn  . Diverticulitis   . GERD (gastroesophageal reflux disease)   . H/O hiatal hernia   . History of diverticulitis   . History of kidney stones   . History of TIA (transient ischemic attack)    04/ 2011---  no residuals  . Hypertension   . Hypothyroidism   . Peripheral neuropathy   . Shingles   . Syrinx of spinal cord ([place])    C4 -- C7    Patient Active Problem List   Diagnosis Date Noted  . Anxiety 10/04/2017  . Nonallopathic lesion of rib cage 06/15/2017  . Nonallopathic lesion of cervical region 05/18/2017  . Nonallopathic lesion of thoracic region 05/18/2017  . Nonallopathic lesion of lumbosacral region 05/18/2017  . Arrhythmia 10/08/2014  . TIA (transient ischemic attack) 08/09/2014  . Lateral meniscal tear 03/12/2014  . Testosterone deficiency 07/04/2013  . Cervicogenic  headache 03/28/2013  . Vitamin D deficiency 01/01/2013  . Hyperlipidemia 01/01/2013  . BPH (benign prostatic hyperplasia) 11/03/2012  . Diverticulosis large intestine w/o perforation or abscess w/bleeding 11/03/2012  . Peripheral neuropathy 11/03/2012  . Unspecified hypothyroidism 01/18/2011  . Essential hypertension 01/18/2011  . GERD (gastroesophageal reflux disease) 01/18/2011  . Cervical spondylosis without myelopathy 11/24/2010  . Headache(784.0) 11/24/2010  . Sleep disturbance, unspecified 11/24/2010  . Syringomyelia and syringobulbia ([place]) 11/24/2010  . Disturbance of skin sensation 11/24/2010  . Cerebrovascular disease, unspecified 11/24/2010    Past Surgical History:  Procedure Laterality Date  . CARDIAC CATHETERIZATION  02-18-2010   dr Angelena Form   normal coronary arteries/  normal lvsf--  ef 65%  . COLONOSCOPY    . [place]  . KNEE ARTHROSCOPY Left 03/13/2014   Procedure: LEFT ARTHROSCOPY KNEE WITH DEBRIDMENT;  Surgeon: Gearlean Alf, MD;  Location: [facility];  Service: Orthopedics;  Laterality: Left;  . LAPAROSCOPIC CHOLECYSTECTOMY  01-30-2003  . NEGATIVE SLEEP STUDY  2013   per pt  . RADIOFREQUENCY ABLATION NERVES  05/2016  . REMOVAL FORGEIN BODY FROM EAR  AGE 26  . RIGHT URETEROSCOPIC LASER LITHOTRIPSY STONE EXTRACTION /  STENT PLACEMENT  05-10-2000  . TRANSTHORACIC ECHOCARDIOGRAM  01-16-2010   normal lvf/  ef  60%/  mild lae        Home Medications    Prior to Admission medications  Medication Sig Start Date End Date Taking? Authorizing Provider  acetaZOLAMIDE (DIAMOX) 125 MG tablet Take 1 tablet (125 mg total) by mouth 2 (two) times daily. 03/13/18  Yes Lyndal Pulley, DO  amLODipine (NORVASC) 10 MG tablet Take 1 tablet (10 mg total) by mouth daily. 05/26/18  Yes Ronnie Doss M, DO  aspirin 81 MG tablet Take 81 mg by mouth at bedtime.    Yes [provider]  Coenzyme Q10 (CO Q 10) 100 MG CAPS Take 1 capsule by  mouth daily.   Yes [provider]  hyoscyamine (LEVSIN SL) 0.125 MG SL tablet Place 1 tablet (0.125 mg total) under the tongue every 6 (six) hours as needed for cramping. 04/28/16  Yes Danis, Kirke Corin, MD  levothyroxine (SYNTHROID, LEVOTHROID) 175 MCG tablet TAKE 1 TABLET BY MOUTH  DAILY BEFORE BREAKFAST Patient taking differently: Take 175 mcg by mouth daily before breakfast.  04/20/18  Yes Timmothy Euler, MD  LORazepam (ATIVAN) 0.5 MG tablet Take 1 tablet (0.5 mg total) by mouth 2 (two) times daily as needed for anxiety. 09/30/17  Yes Timmothy Euler, MD  losartan (COZAAR) 50 MG tablet Take 1 tablet (50 mg total) by mouth daily. 05/26/18  Yes Gottschalk, Leatrice Jewels M, DO  NON FORMULARY Take 3 capsules by mouth every morning. Nugenix Testosterone Booster / [place]    Yes [provider]  omeprazole (PRILOSEC) 20 MG capsule TAKE 1 CAPSULE BY MOUTH  TWICE DAILY Patient taking differently: Take 20 mg by mouth 2 (two) times daily before a meal.  10/07/17  Yes Timmothy Euler, MD  UNABLE TO FIND Med Name: CBD oil   Yes [provider]  venlafaxine XR (EFFEXOR-XR) 75 MG 24 hr capsule Take 1 capsule (75 mg total) by mouth daily with breakfast. 04/07/18  Yes Timmothy Euler, MD  Alpha-Lipoic Acid 600 MG CAPS Take by mouth.    [provider]  predniSONE (STERAPRED UNI-PAK 21 TAB) 10 MG (21) TBPK tablet Take by mouth daily. Take 6 tabs by mouth daily  for 2 days, then 5 tabs for 2 days, then 4 tabs for 2 days, then 3 tabs for 2 days, 2 tabs for 2 days, then 1 tab by mouth daily for 2 days 06/09/18   Irven Baltimore, MD    Family History Family History  Problem Relation Age of Onset  . Prostate cancer Father   . Heart disease Father   . Stroke Father   . Nephrolithiasis Sister   . Cancer Sister        Intestinal  . Hyperlipidemia Sister   . Hyperlipidemia Sister   . Cancer Sister        Sinus  . COPD Unknown   . Colon polyps Brother        x2  . Colon polyps  Sister   . Colon cancer Neg Hx   . Esophageal cancer Neg Hx     Social History Social History   Tobacco Use  . Smoking status: Never Smoker  . Smokeless tobacco: Never Used  Substance Use Topics  . Alcohol use: Yes    Alcohol/week: 0.0 standard drinks    Comment: occasionally  . Drug use: No     Allergies   Cymbalta [duloxetine hcl]; Gabapentin; Lyrica [pregabalin]; Robaxin [methocarbamol]; Sudafed [pseudoephedrine hcl]; and Epinephrine   Review of Systems Review of Systems  Constitutional: Negative for chills and fever.  HENT: Negative for congestion and rhinorrhea.   Respiratory: Negative for shortness of  breath.   Cardiovascular: Negative for chest pain.  Gastrointestinal: Negative for abdominal pain and vomiting.  Musculoskeletal: Positive for back pain (behind left shoulder blade).  Skin: Negative for wound.  Neurological: Positive for weakness and loss of balance (difficulty walking, keeps veering off to the right). Negative for speech change, focal weakness and headaches.  Psychiatric/Behavioral: Negative for confusion.     Physical Exam Updated Vital Signs BP (!) 148/89 (BP Location: Right Arm)   Pulse 69   Temp 97.6 F (36.4 C) (Oral)   Resp 16   Ht 6' (1.829 m)   Wt 88.5 kg   SpO2 97%   BMI 26.45 kg/m   Physical Exam  Constitutional: He appears well-developed and well-nourished.  HENT:  Head: Normocephalic and atraumatic.  Eyes: Conjunctivae are normal.  Neck: Neck supple.  Cardiovascular: Normal rate, regular rhythm and intact distal pulses.  No murmur heard. Pulmonary/Chest: Effort normal and breath sounds normal. No respiratory distress.  Abdominal: Soft. There is no tenderness.  Musculoskeletal: He exhibits no edema.  Neurological: He is alert.  Decreased grip strength with right hand, 4 out of 5 strength with right hip flexion, and 5 out of 5 strength with right hip extension. Otherwise 5 out of 5 strength with bilateral upper extremity  flexion and extension, 5 out of 5 strength with left lower extremity in extension.  Numbness over bilateral sides of chin, intact sensation over forehead, otherwise intact cranial nerves.  Romberg positive, patient lost balance and fell over to the left side.  Patient is able to ambulate but did veer off to the right.  Skin: Skin is warm and dry.  Psychiatric: He has a normal mood and affect.  Nursing note and vitals reviewed.    ED Treatments / Results  Labs (all labs ordered are listed, but only abnormal results are displayed) Labs Reviewed  CBC - Abnormal; Notable for the following components:      Result Value   Hemoglobin 17.6 (*)    All other components within normal limits  COMPREHENSIVE METABOLIC PANEL - Abnormal; Notable for the following components:   CO2 20 (*)    Glucose, Bld 112 (*)    Creatinine, Ser 1.43 (*)    Total Bilirubin 1.4 (*)    GFR calc non Af Amer 50 (*)    GFR calc Af Amer 57 (*)    All other components within normal limits  I-STAT CHEM 8, ED - Abnormal; Notable for the following components:   Potassium 3.4 (*)    Creatinine, Ser 1.40 (*)    Glucose, Bld 108 (*)    TCO2 20 (*)    Hemoglobin 17.7 (*)    All other components within normal limits  PROTIME-INR  APTT  DIFFERENTIAL  I-STAT TROPONIN, ED  CBG MONITORING, ED    EKG EKG Interpretation  Date/Time:  Friday June 09 2018 12:44:41 EDT Ventricular Rate:  66 PR Interval:  130 QRS Duration: 102 QT Interval:  380 QTC Calculation: 398 R Axis:   26 Text Interpretation:  Sinus rhythm with Fusion complexes Otherwise normal ECG No significant change since last tracing Confirmed by Merrily Pew [phone]) on 06/09/2018 6:17:03 PM   Radiology Ct Head Wo Contrast  Result Date: 06/09/2018 CLINICAL DATA:  Episode of tingling about the mouth. Dizziness and headache. Episode of tingling in the right hand and toes. EXAM: CT HEAD WITHOUT CONTRAST TECHNIQUE: Contiguous axial images were obtained from  the base of the skull through the vertex without intravenous contrast. COMPARISON:  MRI brain 08/24/2014 FINDINGS: Brain: No acute infarct, hemorrhage, or mass lesion is present. The ventricles are of normal size. No significant extraaxial fluid collection is present. No significant white matter disease is present. The brainstem and cerebellum are normal. Vascular: No hyperdense vessel or unexpected calcification. Skull: Calvarium is intact. No focal lytic or blastic lesions are present. Sinuses/Orbits: The paranasal sinuses the mastoid air cells are clear. Globes and orbits are within normal limits. IMPRESSION: Negative CT of the head. Electronically Signed   By: San Morelle M.D.   On: 06/09/2018 14:15   Mr Brain Wo Contrast  Result Date: 06/09/2018 CLINICAL DATA:  Initial evaluation for intermittent weakness, tingling, primarily affecting the right side. EXAM: MRI HEAD WITHOUT CONTRAST MRI CERVICAL SPINE WITHOUT CONTRAST TECHNIQUE: Multiplanar, multiecho pulse sequences of the brain and surrounding structures, and cervical spine, to include the craniocervical junction and cervicothoracic junction, were obtained without intravenous contrast. COMPARISON:  Prior CT from earlier the same day as well as previous MRIs from 02/28/2017 and 12/26/2015. FINDINGS: MRI HEAD FINDINGS Brain: Cerebral volume within normal limits for patient age. Few scattered subcentimeter T2/FLAIR hyperintensities noted within the subcortical and deep white matter of both cerebral hemispheres, most prominent at the frontal lobes, nonspecific, but mild for age. No abnormal foci of restricted diffusion to suggest acute or subacute ischemia. Gray-white matter differentiation well maintained. No encephalomalacia to suggest chronic infarction. No foci of susceptibility artifact to suggest acute or chronic intracranial hemorrhage. Mass lesion, midline shift or mass effect. No hydrocephalus. No extra-axial fluid collection. Major dural  sinuses are grossly patent. Pituitary gland and suprasellar region are normal. Midline structures intact and normal. Vascular: Major intracranial vascular flow voids well maintained and normal in appearance. Skull and upper cervical spine: Craniocervical junction normal. Visualized upper cervical spine within normal limits. Bone marrow signal intensity normal. No scalp soft tissue abnormality. Sinuses/Orbits: Globes and orbital soft tissues within normal limits. Paranasal sinuses are clear. No mastoid effusion. Inner ear structures normal. Other: None. MRI CERVICAL SPINE FINDINGS Alignment: Straightening with slight reversal of the normal upper cervical lordosis. No listhesis. Vertebrae: Vertebral body heights maintained without evidence for acute or chronic fracture. Bone marrow signal intensity within normal limits. No discrete or worrisome osseous lesions. No abnormal marrow edema. Cord: Small syrinx extending from approximately the C4-5 level through C7 seen. This measures up to 3 mm in maximal diameter at the level of C6. Overall, appearance relatively stable from previous. Signal intensity within the cervical spinal cord otherwise within normal limits. Posterior Fossa, vertebral arteries, paraspinal tissues: Craniocervical junction within normal limits. Paraspinous and prevertebral soft tissues normal. Normal intravascular flow voids present within the vertebral arteries bilaterally. Disc levels: C2-C3: Unremarkable. C3-C4: Small central disc protrusion indents the ventral thecal sac. No significant stenosis or cord deformity. Foramina remain patent. C4-C5: Right eccentric disc bulge with right greater than left uncovertebral hypertrophy. Flattening of the ventral thecal sac without significant spinal stenosis. Moderate right C5 foraminal stenosis, similar to previous exams. Mild left foraminal narrowing. C5-C6: Mild disc bulge with uncovertebral spurring. No significant spinal stenosis. Mild left C6 foraminal  narrowing. C6-C7: Mild disc bulge with uncovertebral spurring. Mild left-sided facet hypertrophy. No spinal stenosis. Mild left C7 foraminal narrowing. C7-T1:  Unremarkable. Visualized upper thoracic spine within normal limits. IMPRESSION: MRI HEAD IMPRESSION: 1. No acute intracranial abnormality. 2. Mild for age nonspecific cerebral white matter changes. MRA HEAD IMPRESSION: 1. Overall similar appearance of the cervical spine as compared to 2017. 2. Small central syrinx at  C4-5 through C7 measuring up to 3 mm in maximal diameter. 3. Chronic right eccentric disc osteophyte at C4-5 with resultant moderate right C5 foraminal stenosis. Finding could result in right-sided symptoms. Electronically Signed   By: Jeannine Boga M.D.   On: 06/09/2018 21:56   Mr Cervical Spine Wo Contrast  Result Date: 06/09/2018 CLINICAL DATA:  Initial evaluation for intermittent weakness, tingling, primarily affecting the right side. EXAM: MRI HEAD WITHOUT CONTRAST MRI CERVICAL SPINE WITHOUT CONTRAST TECHNIQUE: Multiplanar, multiecho pulse sequences of the brain and surrounding structures, and cervical spine, to include the craniocervical junction and cervicothoracic junction, were obtained without intravenous contrast. COMPARISON:  Prior CT from earlier the same day as well as previous MRIs from 02/28/2017 and 12/26/2015. FINDINGS: MRI HEAD FINDINGS Brain: Cerebral volume within normal limits for patient age. Few scattered subcentimeter T2/FLAIR hyperintensities noted within the subcortical and deep white matter of both cerebral hemispheres, most prominent at the frontal lobes, nonspecific, but mild for age. No abnormal foci of restricted diffusion to suggest acute or subacute ischemia. Gray-white matter differentiation well maintained. No encephalomalacia to suggest chronic infarction. No foci of susceptibility artifact to suggest acute or chronic intracranial hemorrhage. Mass lesion, midline shift or mass effect. No  hydrocephalus. No extra-axial fluid collection. Major dural sinuses are grossly patent. Pituitary gland and suprasellar region are normal. Midline structures intact and normal. Vascular: Major intracranial vascular flow voids well maintained and normal in appearance. Skull and upper cervical spine: Craniocervical junction normal. Visualized upper cervical spine within normal limits. Bone marrow signal intensity normal. No scalp soft tissue abnormality. Sinuses/Orbits: Globes and orbital soft tissues within normal limits. Paranasal sinuses are clear. No mastoid effusion. Inner ear structures normal. Other: None. MRI CERVICAL SPINE FINDINGS Alignment: Straightening with slight reversal of the normal upper cervical lordosis. No listhesis. Vertebrae: Vertebral body heights maintained without evidence for acute or chronic fracture. Bone marrow signal intensity within normal limits. No discrete or worrisome osseous lesions. No abnormal marrow edema. Cord: Small syrinx extending from approximately the C4-5 level through C7 seen. This measures up to 3 mm in maximal diameter at the level of C6. Overall, appearance relatively stable from previous. Signal intensity within the cervical spinal cord otherwise within normal limits. Posterior Fossa, vertebral arteries, paraspinal tissues: Craniocervical junction within normal limits. Paraspinous and prevertebral soft tissues normal. Normal intravascular flow voids present within the vertebral arteries bilaterally. Disc levels: C2-C3: Unremarkable. C3-C4: Small central disc protrusion indents the ventral thecal sac. No significant stenosis or cord deformity. Foramina remain patent. C4-C5: Right eccentric disc bulge with right greater than left uncovertebral hypertrophy. Flattening of the ventral thecal sac without significant spinal stenosis. Moderate right C5 foraminal stenosis, similar to previous exams. Mild left foraminal narrowing. C5-C6: Mild disc bulge with uncovertebral  spurring. No significant spinal stenosis. Mild left C6 foraminal narrowing. C6-C7: Mild disc bulge with uncovertebral spurring. Mild left-sided facet hypertrophy. No spinal stenosis. Mild left C7 foraminal narrowing. C7-T1:  Unremarkable. Visualized upper thoracic spine within normal limits. IMPRESSION: MRI HEAD IMPRESSION: 1. No acute intracranial abnormality. 2. Mild for age nonspecific cerebral white matter changes. MRA HEAD IMPRESSION: 1. Overall similar appearance of the cervical spine as compared to 2017. 2. Small central syrinx at C4-5 through C7 measuring up to 3 mm in maximal diameter. 3. Chronic right eccentric disc osteophyte at C4-5 with resultant moderate right C5 foraminal stenosis. Finding could result in right-sided symptoms. Electronically Signed   By: Jeannine Boga M.D.   On: 06/09/2018 21:56  Procedures Procedures (including critical care time)  Medications Ordered in ED Medications - No data to display   Initial Impression / Assessment and Plan / ED Course  I have reviewed the triage vital signs and the nursing notes.  Pertinent labs & imaging results that were available during my care of the patient were reviewed by me and considered in my medical decision making (see chart for details).     The patient is a 67yoM with PMH syrinx who presents with bilateral facial numbness and weakness from his primary care providers office. The patient was reportedly was drifting to the right. On my exam, patient has numbness over his chin bilaterally. He also has numbness of his left foot and right grip strength weakness. The patient reports he has had similar symptoms with his syrinx, however these are more persistent. INR wnl. CBC shows no anemia or leukocytosis. CMP shows elevated creatinine. CT head shows no signs of CVA. MRI head shows no signs of CVA. MRI C spine shows osteophytes, but no spinal cord injury. Patient instructed to follow up with his primary care provider and  neurosurgery. Patient reports understanding of and agreement with his discharge precautions.   Patient care supervised by Dr. Dayna Barker.  Irven Baltimore, MD  Final Clinical Impressions(s) / ED Diagnoses   Final diagnoses:  Weakness  Facial numbness    ED Discharge Orders         Ordered    predniSONE (STERAPRED UNI-PAK 21 TAB) 10 MG (21) TBPK tablet  Daily     06/09/18 2307           Irven Baltimore, MD 06/10/18 [id number]    Merrily Pew, MD 06/11/18 [id number]

## 2018-06-09 NOTE — ED Notes (Signed)
Discussed symptoms with Dr. Billy Fischer, agreed with stroke order set, not code stroke at this time

## 2018-06-09 NOTE — ED Notes (Signed)
Ambulated pt to RR  

## 2018-06-09 NOTE — ED Notes (Signed)
Patient transported to MRI 

## 2018-06-09 NOTE — Progress Notes (Signed)
BP 134/86   Pulse 81   Temp 98.2 F (36.8 C) (Oral)   Ht 6\' 1"  (1.854 m)   Wt 197 lb (89.4 kg)   BMI 25.99 kg/m    Subjective:    Patient ID: Jacob Kerr, male    DOB: Feb 04, 1951, 67 y.o.   MRN: [id number]  HPI: Jacob Kerr is a 67 y.o. male presenting on 06/09/2018 for weakess right side (face tingles, right arm and leg weakness, several near falls today )   HPI Right-sided numbness and weakness Patient comes in with complaints of right-sided numbness and weakness that is similar to what he is had on previous bouts but much worse today.  He has had some right sided numbness and weakness and some facial numbness that has occurred occasionally because of his spinal syrinx but today starting 2 hours before he saw me he really started having this right sided numbness and weakness where he felt like his coffee cup was heavy and he could not trust himself to carry his granddaughter in that arm because of fear of dropping her.  He denies any headaches but does have dizziness and just generally feels numb and tingly over his face.  He says this has been similar to previous episodes but he is also had TIAs before.  He has been diagnosed with the syrinx as well and is slotted at some point in the near future to see [facility].  He denies any chest pain or shortness of breath or blurred vision or changes in taste or smell or hearing.  Relevant past medical, surgical, family and social history reviewed and updated as indicated. Interim medical history since our last visit reviewed. Allergies and medications reviewed and updated.  Review of Systems  Constitutional: Negative for chills and fever.  Respiratory: Negative for shortness of breath and wheezing.   Cardiovascular: Negative for chest pain and leg swelling.  Musculoskeletal: Positive for back pain and neck pain. Negative for gait problem.  Skin: Negative for rash.  Neurological: Positive for dizziness, weakness and numbness. Negative for  headaches.  All other systems reviewed and are negative.   Per HPI unless specifically indicated above   Allergies as of 06/09/2018      Reactions   Cymbalta [duloxetine Hcl]    Depression   Robaxin [methocarbamol] Other (See Comments)   Seeing "bright lights"   Sudafed [pseudoephedrine Hcl] Other (See Comments)   Increased heart rate   Epinephrine Palpitations      Medication List        Accurate as of 06/09/18 11:00 AM. Always use your most recent med list.          acetaZOLAMIDE 125 MG tablet Commonly known as:  DIAMOX Take 1 tablet (125 mg total) by mouth 2 (two) times daily.   Acidophilus/Goat Milk Caps Take 1 capsule by mouth daily.   Alpha-Lipoic Acid 600 MG Caps Take by mouth.   amLODipine 10 MG tablet Commonly known as:  NORVASC Take 1 tablet (10 mg total) by mouth daily.   aspirin 81 MG tablet Take 81 mg by mouth at bedtime.   Co Q 10 100 MG Caps Take 1 capsule by mouth daily.   hyoscyamine 0.125 MG SL tablet Commonly known as:  LEVSIN SL Place 1 tablet (0.125 mg total) under the tongue every 6 (six) hours as needed for cramping.   levothyroxine 175 MCG tablet Commonly known as:  SYNTHROID, LEVOTHROID TAKE 1 TABLET BY MOUTH  DAILY BEFORE BREAKFAST  LORazepam 0.5 MG tablet Commonly known as:  ATIVAN Take 1 tablet (0.5 mg total) by mouth 2 (two) times daily as needed for anxiety.   losartan 50 MG tablet Commonly known as:  COZAAR Take 1 tablet (50 mg total) by mouth daily.   NON FORMULARY Nugenix Testosterone Booster / [employer]   omeprazole 20 MG capsule Commonly known as:  PRILOSEC TAKE 1 CAPSULE BY MOUTH  TWICE DAILY   PROBIOTIC PO Take by mouth.   UNABLE TO FIND Med Name: CBD oil   venlafaxine XR 75 MG 24 hr capsule Commonly known as:  EFFEXOR-XR Take 1 capsule (75 mg total) by mouth daily with breakfast.          Objective:    BP 134/86   Pulse 81   Temp 98.2 F (36.8 C) (Oral)   Ht 6\' 1"  (1.854 m)   Wt 197 lb (89.4 kg)    BMI 25.99 kg/m   Wt Readings from Last 3 Encounters:  06/09/18 197 lb (89.4 kg)  04/12/18 197 lb (89.4 kg)  03/13/18 203 lb (92.1 kg)    Physical Exam  Constitutional: He is oriented to person, place, and time. He appears well-developed and well-nourished. No distress.  Eyes: Conjunctivae are normal. No scleral icterus.  Neck: Neck supple. No thyromegaly present.  Cardiovascular: Normal rate, regular rhythm, normal heart sounds and intact distal pulses.  No murmur heard. Pulmonary/Chest: Effort normal and breath sounds normal. No respiratory distress. He has no wheezes.  Musculoskeletal: Normal range of motion. He exhibits no edema.  Lymphadenopathy:    He has no cervical adenopathy.  Neurological: He is alert and oriented to person, place, and time. A sensory deficit (Numbness over his face, he feels like it same on both sides) is present. No cranial nerve deficit. He exhibits abnormal muscle tone (Right arm and right leg weakness, 4 out of 5 compared to 5 out of 5 on the left side). Coordination (When walking patient is drifting to the right) abnormal.  Skin: Skin is warm and dry. No rash noted. He is not diaphoretic.  Nursing note and vitals reviewed.       Assessment & Plan:   Problem List Items Addressed This Visit    None    Visit Diagnoses    Right sided weakness    -  Primary   Worse than what he normally is today compared to previous      Recommended for patient to go to the emergency department at [facility] and he is going to be transported there by his daughter who is an employee at her office.  Because the right-sided weakness and numbness is worse than what it normally is that is why we are sending him on.  He does have a history of TIAs and also a syrinx but we cannot discern between those 2 possibilities here in our office and the emergency department will help with that process. Follow up plan: Return if symptoms worsen or fail to  improve.  Counseling provided for all of the vaccine components No orders of the defined types were placed in this encounter.   Caryl Pina, MD [facility] 06/09/2018, 11:00 AM

## 2018-06-13 ENCOUNTER — Ambulatory Visit: Payer: Medicare Other | Admitting: *Deleted

## 2018-06-13 DIAGNOSIS — M546 Pain in thoracic spine: Secondary | ICD-10-CM

## 2018-06-13 NOTE — Therapy (Signed)
[facility] [place], [place], [address] Phone: [phone]   Fax:  [phone]  Physical Therapy Treatment  Patient Details  Name: Jacob Kerr MRN: [id number] Date of Birth: 1951-07-31 Referring Provider: Hulan Saas    Encounter Date: 06/13/2018  PT End of Session - 06/13/18 1502    Visit Number  6    Number of Visits  12    Date for PT Re-Evaluation  06/29/18    PT Start Time  6962    PT Stop Time  1436    PT Time Calculation (min)  51 min       Past Medical History:  Diagnosis Date  . Acute meniscal tear of left knee   . Allergic rhinitis   . BPH (benign prostatic hypertrophy)   . Cervical spondylosis without myelopathy   . Cervicogenic headache   . Chronic neck pain    uses TENS unit  prn  . Diverticulitis   . GERD (gastroesophageal reflux disease)   . H/O hiatal hernia   . History of diverticulitis   . History of kidney stones   . History of TIA (transient ischemic attack)    04/ 2011---  no residuals  . Hypertension   . Hypothyroidism   . Peripheral neuropathy   . Shingles   . Syrinx of spinal cord ([place])    C4 -- C7    Past Surgical History:  Procedure Laterality Date  . CARDIAC CATHETERIZATION  02-18-2010   dr Angelena Form   normal coronary arteries/  normal lvsf--  ef 65%  . COLONOSCOPY    . [place]  . KNEE ARTHROSCOPY Left 03/13/2014   Procedure: LEFT ARTHROSCOPY KNEE WITH DEBRIDMENT;  Surgeon: Gearlean Alf, MD;  Location: [facility];  Service: Orthopedics;  Laterality: Left;  . LAPAROSCOPIC CHOLECYSTECTOMY  01-30-2003  . NEGATIVE SLEEP STUDY  2013   per pt  . RADIOFREQUENCY ABLATION NERVES  05/2016  . REMOVAL FORGEIN BODY FROM EAR  AGE 67  . RIGHT URETEROSCOPIC LASER LITHOTRIPSY STONE EXTRACTION /  STENT PLACEMENT  05-10-2000  . TRANSTHORACIC ECHOCARDIOGRAM  01-16-2010   normal lvf/  ef  60%/  mild lae    There were no vitals filed for this  visit.  Subjective Assessment - 06/13/18 1348    Subjective  I feel real fatigued today. I was on stroke watch this weekend. Tingling in my face    Pertinent History  Peripheral neuropathy; headaches; left knee scope; neck pain; TIA.    Patient Stated Goals  Lift grandchildren with less pain.    Currently in Pain?  Yes    Pain Score  5     Pain Location  Chest    Pain Orientation  Right;Mid    Pain Descriptors / Indicators  Discomfort    Pain Type  Chronic pain    Pain Onset  More than a month ago    Pain Frequency  Intermittent                       [facility] Adult PT Treatment/Exercise - 06/13/18 0001      Exercises   Exercises  Shoulder;Lumbar      Modalities   Modalities  Electrical Stimulation;Moist Heat;Ultrasound      Moist Heat Therapy   Number Minutes Moist Heat  15 Minutes    Moist Heat Location  Lumbar Spine   and thoracic spine     Electrical Stimulation  Electrical Stimulation Location  Thoracic paraspinals IFC 80-150hz  x 15 mins    Electrical Stimulation Goals  Pain      Ultrasound   Ultrasound Location  RT medial border RT shldr blade    Ultrasound Parameters  Combo at 1.5 w/cm2 x 12 mins    Ultrasound Goals  Pain      Manual Therapy   Manual Therapy  Myofascial release;Soft tissue mobilization    Myofascial Release  RT scapular lift with holds and mobs with  Pt LT sidelying                  PT Long Term Goals - 06/06/18 1410      PT LONG TERM GOAL #1   Title  Independent with a HEP.    Time  6    Period  Weeks    Status  On-going      PT LONG TERM GOAL #2   Title  Perform ADL's with pain not > 4/10.    Time  6    Period  Weeks    Status  On-going   no change 05/25/18     PT LONG TERM GOAL #3   Title  Lift grandchildren with pain not > 4/10.    Time  6    Period  Weeks    Status  On-going   7-9/10 at times per patient report 06/06/2018           Plan - 06/13/18 1351    Clinical Impression Statement  Pt  arrived today stating that he was having some stroke like symptoms over the weekend, but doing ok. I still have some facial numbness and just really tired today. Therex was not performed today due to Pt's status. [place] combo was performed to medial border  RT shldr blade while in sidelying F/B scapular lift and mobs. Pt tolerated very well and felt some relief after session. He had normal reaction to modalities  after removal of modalities.    Clinical Presentation  Unstable    Rehab Potential  Fair    PT Frequency  2x / week    PT Duration  6 weeks    PT Treatment/Interventions  ADLs/Self Care Home Management;Cryotherapy;Electrical Stimulation;Ultrasound;Traction;Moist Heat;Therapeutic activities;Therapeutic exercise;Balance training;Neuromuscular re-education;Patient/family education;Manual techniques    PT Next Visit Plan  IFC and HMP; core exercises; Combo e'stim/U/S and STW/M.  FALL RISK.    Consulted and Agree with Plan of Care  Patient       Patient will benefit from skilled therapeutic intervention in order to improve the following deficits and impairments:  Abnormal gait, Decreased activity tolerance, Decreased balance, Pain  Visit Diagnosis: Pain in thoracic spine     Problem List Patient Active Problem List   Diagnosis Date Noted  . Anxiety 10/04/2017  . Nonallopathic lesion of rib cage 06/15/2017  . Nonallopathic lesion of cervical region 05/18/2017  . Nonallopathic lesion of thoracic region 05/18/2017  . Nonallopathic lesion of lumbosacral region 05/18/2017  . Arrhythmia 10/08/2014  . TIA (transient ischemic attack) 08/09/2014  . Lateral meniscal tear 03/12/2014  . Testosterone deficiency 07/04/2013  . Cervicogenic headache 03/28/2013  . Vitamin D deficiency 01/01/2013  . Hyperlipidemia 01/01/2013  . BPH (benign prostatic hyperplasia) 11/03/2012  . Diverticulosis large intestine w/o perforation or abscess w/bleeding 11/03/2012  . Peripheral neuropathy 11/03/2012  .  Unspecified hypothyroidism 01/18/2011  . Essential hypertension 01/18/2011  . GERD (gastroesophageal reflux disease) 01/18/2011  . Cervical spondylosis without myelopathy 11/24/2010  . Headache(784.0) 11/24/2010  .  Sleep disturbance, unspecified 11/24/2010  . Syringomyelia and syringobulbia ([place]) 11/24/2010  . Disturbance of skin sensation 11/24/2010  . Cerebrovascular disease, unspecified 11/24/2010    RAMSEUR,CHRIS, PTA 06/13/2018, 3:03 PM  [facility] [address] [place], [place], [address] Phone: [phone]   Fax:  [phone]  Name: Jacob Kerr MRN: [id number] Date of Birth: 24-Mar-1951

## 2018-06-15 ENCOUNTER — Ambulatory Visit: Payer: Medicare Other | Admitting: *Deleted

## 2018-06-15 DIAGNOSIS — M546 Pain in thoracic spine: Secondary | ICD-10-CM | POA: Diagnosis not present

## 2018-06-15 NOTE — Therapy (Signed)
[facility] [place], [place], [address] Phone: [phone]   Fax:  [phone]  Physical Therapy Treatment  Patient Details  Name: Jacob Kerr MRN: [id number] Date of Birth: 06/03/1951 Referring Provider: Hulan Saas    Encounter Date: 06/15/2018  PT End of Session - 06/15/18 1406    Visit Number  7    Number of Visits  12    Date for PT Re-Evaluation  06/29/18    PT Start Time  9323    PT Stop Time  1436    PT Time Calculation (min)  51 min       Past Medical History:  Diagnosis Date  . Acute meniscal tear of left knee   . Allergic rhinitis   . BPH (benign prostatic hypertrophy)   . Cervical spondylosis without myelopathy   . Cervicogenic headache   . Chronic neck pain    uses TENS unit  prn  . Diverticulitis   . GERD (gastroesophageal reflux disease)   . H/O hiatal hernia   . History of diverticulitis   . History of kidney stones   . History of TIA (transient ischemic attack)    04/ 2011---  no residuals  . Hypertension   . Hypothyroidism   . Peripheral neuropathy   . Shingles   . Syrinx of spinal cord ([place])    C4 -- C7    Past Surgical History:  Procedure Laterality Date  . CARDIAC CATHETERIZATION  02-18-2010   dr Angelena Form   normal coronary arteries/  normal lvsf--  ef 65%  . COLONOSCOPY    . [place]  . KNEE ARTHROSCOPY Left 03/13/2014   Procedure: LEFT ARTHROSCOPY KNEE WITH DEBRIDMENT;  Surgeon: Gearlean Alf, MD;  Location[facility];  Service: Orthopedics;  Laterality: Left;  . LAPAROSCOPIC CHOLECYSTECTOMY  01-30-2003  . NEGATIVE SLEEP STUDY  2013   per pt  . RADIOFREQUENCY ABLATION NERVES  05/2016  . REMOVAL FORGEIN BODY FROM EAR  AGE 29  . RIGHT URETEROSCOPIC LASER LITHOTRIPSY STONE EXTRACTION /  STENT PLACEMENT  05-10-2000  . TRANSTHORACIC ECHOCARDIOGRAM  01-16-2010   normal lvf/  ef  60%/  mild lae    There were no vitals filed for this  visit.  Subjective Assessment - 06/15/18 1404    Subjective  Doing better today    Pertinent History  Peripheral neuropathy; headaches; left knee scope; neck pain; TIA.    Patient Stated Goals  Lift grandchildren with less pain.    Currently in Pain?  Yes    Pain Score  5     Pain Location  Chest    Pain Orientation  Right;Mid    Pain Descriptors / Indicators  Discomfort    Pain Type  Chronic pain    Pain Onset  More than a month ago    Pain Frequency  Intermittent                       [facility] Adult PT Treatment/Exercise - 06/15/18 0001      Exercises   Exercises  Shoulder;Lumbar      Lumbar Exercises: Aerobic   Nustep  L5 x15 min      Modalities   Modalities  Electrical Stimulation;Moist Heat;Ultrasound      Moist Heat Therapy   Number Minutes Moist Heat  15 Minutes    Moist Heat Location  Lumbar Spine      Electrical Stimulation   Electrical  Stimulation Location  Thoracic paraspinals IFC 80-150hz  x 15 mins    Electrical Stimulation Goals  Pain      Ultrasound   Ultrasound Location  RT medial border RT scapula    Ultrasound Parameters  US/ Combo x 12 mins at1.5 w/cm2 LT sidelying    Ultrasound Goals  Pain                  PT Long Term Goals - 06/06/18 1410      PT LONG TERM GOAL #1   Title  Independent with a HEP.    Time  6    Period  Weeks    Status  On-going      PT LONG TERM GOAL #2   Title  Perform ADL's with pain not > 4/10.    Time  6    Period  Weeks    Status  On-going   no change 05/25/18     PT LONG TERM GOAL #3   Title  Lift grandchildren with pain not > 4/10.    Time  6    Period  Weeks    Status  On-going   7-9/10 at times per patient report 06/06/2018           Plan - 06/15/18 1419    Clinical Impression Statement  Pt was feeling beter today and was able to resume some therex and was tolerated well. Pt did better after last Rx and wasn't as sore today in thoracic area RT side scapular border. Normal  modality response today.    Clinical Presentation  Unstable    Rehab Potential  Fair    PT Treatment/Interventions  ADLs/Self Care Home Management;Cryotherapy;Electrical Stimulation;Ultrasound;Traction;Moist Heat;Therapeutic activities;Therapeutic exercise;Balance training;Neuromuscular re-education;Patient/family education;Manual techniques    PT Next Visit Plan  IFC and HMP; core exercises; Combo e'stim/U/S and STW/M.  FALL RISK.       Patient will benefit from skilled therapeutic intervention in order to improve the following deficits and impairments:  Abnormal gait, Decreased activity tolerance, Decreased balance, Pain  Visit Diagnosis: Pain in thoracic spine     Problem List Patient Active Problem List   Diagnosis Date Noted  . Anxiety 10/04/2017  . Nonallopathic lesion of rib cage 06/15/2017  . Nonallopathic lesion of cervical region 05/18/2017  . Nonallopathic lesion of thoracic region 05/18/2017  . Nonallopathic lesion of lumbosacral region 05/18/2017  . Arrhythmia 10/08/2014  . TIA (transient ischemic attack) 08/09/2014  . Lateral meniscal tear 03/12/2014  . Testosterone deficiency 07/04/2013  . Cervicogenic headache 03/28/2013  . Vitamin D deficiency 01/01/2013  . Hyperlipidemia 01/01/2013  . BPH (benign prostatic hyperplasia) 11/03/2012  . Diverticulosis large intestine w/o perforation or abscess w/bleeding 11/03/2012  . Peripheral neuropathy 11/03/2012  . Unspecified hypothyroidism 01/18/2011  . Essential hypertension 01/18/2011  . GERD (gastroesophageal reflux disease) 01/18/2011  . Cervical spondylosis without myelopathy 11/24/2010  . Headache(784.0) 11/24/2010  . Sleep disturbance, unspecified 11/24/2010  . Syringomyelia and syringobulbia ([place]) 11/24/2010  . Disturbance of skin sensation 11/24/2010  . Cerebrovascular disease, unspecified 11/24/2010    RAMSEUR,CHRIS, PTA 06/15/2018, 3:49 PM  [facility] [address] [place], [place], [address] Phone: [phone]   Fax:  [phone]  Name: Jacob Kerr MRN: [id number] Date of Birth: 26-Aug-1951

## 2018-06-20 ENCOUNTER — Ambulatory Visit: Payer: Medicare Other | Admitting: *Deleted

## 2018-06-20 DIAGNOSIS — M546 Pain in thoracic spine: Secondary | ICD-10-CM | POA: Diagnosis not present

## 2018-06-20 NOTE — Therapy (Signed)
[facility] [place], [place], [address] Phone: [phone]   Fax:  [phone]  Physical Therapy Treatment  Patient Details  Name: Jacob Kerr MRN: [id number] Date of Birth: 1950/10/28 Referring Provider: Hulan Saas    Encounter Date: 06/20/2018  PT End of Session - 06/20/18 1520    Visit Number  8    Number of Visits  12    Date for PT Re-Evaluation  06/29/18    PT Start Time  7673    PT Stop Time  1521    PT Time Calculation (min)  49 min    Activity Tolerance  Patient tolerated treatment well    Behavior During Therapy  [facility] for tasks assessed/performed       Past Medical History:  Diagnosis Date  . Acute meniscal tear of left knee   . Allergic rhinitis   . BPH (benign prostatic hypertrophy)   . Cervical spondylosis without myelopathy   . Cervicogenic headache   . Chronic neck pain    uses TENS unit  prn  . Diverticulitis   . GERD (gastroesophageal reflux disease)   . H/O hiatal hernia   . History of diverticulitis   . History of kidney stones   . History of TIA (transient ischemic attack)    04/ 2011---  no residuals  . Hypertension   . Hypothyroidism   . Peripheral neuropathy   . Shingles   . Syrinx of spinal cord ([place])    C4 -- C7    Past Surgical History:  Procedure Laterality Date  . CARDIAC CATHETERIZATION  02-18-2010   dr Angelena Form   normal coronary arteries/  normal lvsf--  ef 65%  . COLONOSCOPY    . [place]  . KNEE ARTHROSCOPY Left 03/13/2014   Procedure: LEFT ARTHROSCOPY KNEE WITH DEBRIDMENT;  Surgeon: Gearlean Alf, MD;  Location: [facility];  Service: Orthopedics;  Laterality: Left;  . LAPAROSCOPIC CHOLECYSTECTOMY  01-30-2003  . NEGATIVE SLEEP STUDY  2013   per pt  . RADIOFREQUENCY ABLATION NERVES  05/2016  . REMOVAL FORGEIN BODY FROM EAR  AGE 67  . RIGHT URETEROSCOPIC LASER LITHOTRIPSY STONE EXTRACTION /  STENT PLACEMENT  05-10-2000  .  TRANSTHORACIC ECHOCARDIOGRAM  01-16-2010   normal lvf/  ef  60%/  mild lae    There were no vitals filed for this visit.  Subjective Assessment - 06/20/18 1433    Subjective  Not doing well today. 7-8/10 in my back today    Pertinent History  Peripheral neuropathy; headaches; left knee scope; neck pain; TIA.    Patient Stated Goals  Lift grandchildren with less pain.    Currently in Pain?  Yes    Pain Score  7     Pain Location  Thoracic    Pain Orientation  Right;Mid    Pain Type  Chronic pain    Pain Onset  More than a month ago    Pain Frequency  Intermittent                       [facility] Adult PT Treatment/Exercise - 06/20/18 0001      Exercises   Exercises  Shoulder;Lumbar      Modalities   Modalities  Electrical Stimulation;Moist Heat;Ultrasound      Moist Heat Therapy   Number Minutes Moist Heat  15 Minutes    Moist Heat Location  Lumbar Spine   thoracic spine  Academic librarian paraspinals IFC 80-150hz  x 15 mins    Electrical Stimulation Action  supine    Electrical Stimulation Goals  Pain      Ultrasound   Ultrasound Location  RT medial scap border    Ultrasound Parameters  [place] combo x 12 mins 1.5 w/cm2     Ultrasound Goals  Pain      Manual Therapy   Manual Therapy  Myofascial release;Soft tissue mobilization    Manual therapy comments  STW/MFR to right scapula medial border and sub-scap musculature with lift with Pt sidelying                  PT Long Term Goals - 06/06/18 1410      PT LONG TERM GOAL #1   Title  Independent with a HEP.    Time  6    Period  Weeks    Status  On-going      PT LONG TERM GOAL #2   Title  Perform ADL's with pain not > 4/10.    Time  6    Period  Weeks    Status  On-going   no change 05/25/18     PT LONG TERM GOAL #3   Title  Lift grandchildren with pain not > 4/10.    Time  6    Period  Weeks    Status  On-going   7-9/10 at times per  patient report 06/06/2018           Plan - 06/20/18 1434    Clinical Impression Statement  Pt arrived today reporting higher pain levels today of 7-8/10 RT thoracic area and very tight under RT shldr blade. Rx focused on pain relief with US/ combo along medial border RT shldr blade F/B STW to same and subscap musculature. Normal modality response today after removal of modalities and increased shldr elevation after Rx.    Clinical Presentation  Unstable    Rehab Potential  Fair    PT Frequency  2x / week    PT Treatment/Interventions  ADLs/Self Care Home Management;Cryotherapy;Electrical Stimulation;Ultrasound;Traction;Moist Heat;Therapeutic activities;Therapeutic exercise;Balance training;Neuromuscular re-education;Patient/family education;Manual techniques    PT Next Visit Plan  IFC and HMP; core exercises; Combo e'stim/U/S and STW/M.  FALL RISK.       Patient will benefit from skilled therapeutic intervention in order to improve the following deficits and impairments:  Abnormal gait, Decreased activity tolerance, Decreased balance, Pain  Visit Diagnosis: Pain in thoracic spine     Problem List Patient Active Problem List   Diagnosis Date Noted  . Anxiety 10/04/2017  . Nonallopathic lesion of rib cage 06/15/2017  . Nonallopathic lesion of cervical region 05/18/2017  . Nonallopathic lesion of thoracic region 05/18/2017  . Nonallopathic lesion of lumbosacral region 05/18/2017  . Arrhythmia 10/08/2014  . TIA (transient ischemic attack) 08/09/2014  . Lateral meniscal tear 03/12/2014  . Testosterone deficiency 07/04/2013  . Cervicogenic headache 03/28/2013  . Vitamin D deficiency 01/01/2013  . Hyperlipidemia 01/01/2013  . BPH (benign prostatic hyperplasia) 11/03/2012  . Diverticulosis large intestine w/o perforation or abscess w/bleeding 11/03/2012  . Peripheral neuropathy 11/03/2012  . Unspecified hypothyroidism 01/18/2011  . Essential hypertension 01/18/2011  . GERD  (gastroesophageal reflux disease) 01/18/2011  . Cervical spondylosis without myelopathy 11/24/2010  . Headache(784.0) 11/24/2010  . Sleep disturbance, unspecified 11/24/2010  . Syringomyelia and syringobulbia ([place]) 11/24/2010  . Disturbance of skin sensation 11/24/2010  . Cerebrovascular disease, unspecified 11/24/2010  RAMSEUR,CHRIS, PTA 06/20/2018, 3:36 PM  [facility] [address] [place], [place], [address] Phone: [phone]   Fax:  [phone]  Name: Jacob Kerr MRN: [id number] Date of Birth: Nov 26, 1950

## 2018-06-22 ENCOUNTER — Encounter: Payer: Self-pay | Admitting: Physical Therapy

## 2018-06-22 ENCOUNTER — Ambulatory Visit: Payer: Medicare Other | Admitting: Physical Therapy

## 2018-06-22 DIAGNOSIS — M546 Pain in thoracic spine: Secondary | ICD-10-CM | POA: Diagnosis not present

## 2018-06-22 NOTE — Therapy (Cosign Needed)
[facility] [place], [place], [address] Phone: [phone]   Fax:  [phone]  Physical Therapy Treatment  Patient Details  Name: Jacob Kerr MRN: [id number] Date of Birth: [birth year] Referring Provider (PT): Hulan Saas    Encounter Date: 06/22/2018  PT End of Session - 06/22/18 1553    Visit Number  9    Number of Visits  12    Date for PT Re-Evaluation  06/29/18    PT Start Time  1532    PT Stop Time  1601    PT Time Calculation (min)  29 min    Activity Tolerance  Patient tolerated treatment well    Behavior During Therapy  [facility] for tasks assessed/performed       Past Medical History:  Diagnosis Date  . Acute meniscal tear of left knee   . Allergic rhinitis   . BPH (benign prostatic hypertrophy)   . Cervical spondylosis without myelopathy   . Cervicogenic headache   . Chronic neck pain    uses TENS unit  prn  . Diverticulitis   . GERD (gastroesophageal reflux disease)   . H/O hiatal hernia   . History of diverticulitis   . History of kidney stones   . History of TIA (transient ischemic attack)    04/ 2011---  no residuals  . Hypertension   . Hypothyroidism   . Peripheral neuropathy   . Shingles   . Syrinx of spinal cord ([place])    C4 -- C7    Past Surgical History:  Procedure Laterality Date  . CARDIAC CATHETERIZATION  02-18-2010   dr Angelena Form   normal coronary arteries/  normal lvsf--  ef 65%  . COLONOSCOPY    . [place]  . KNEE ARTHROSCOPY Left 03/13/2014   Procedure: LEFT ARTHROSCOPY KNEE WITH DEBRIDMENT;  Surgeon: Gearlean Alf, MD;  Location: [facility];  Service: Orthopedics;  Laterality: Left;  . LAPAROSCOPIC CHOLECYSTECTOMY  01-30-2003  . NEGATIVE SLEEP STUDY  2013   per pt  . RADIOFREQUENCY ABLATION NERVES  05/2016  . REMOVAL FORGEIN BODY FROM EAR  AGE 50  . RIGHT URETEROSCOPIC LASER LITHOTRIPSY STONE EXTRACTION /  STENT PLACEMENT  05-10-2000  .  TRANSTHORACIC ECHOCARDIOGRAM  01-16-2010   normal lvf/  ef  60%/  mild lae    There were no vitals filed for this visit.  Subjective Assessment - 06/22/18 1535    Subjective  Patient arrived late, still not feeling well today and ongoing pain    Pertinent History  Peripheral neuropathy; headaches; left knee scope; neck pain; TIA.    Patient Stated Goals  Lift grandchildren with less pain.    Currently in Pain?  Yes    Pain Score  8     Pain Location  Thoracic    Pain Orientation  Right;Mid    Pain Descriptors / Indicators  Discomfort    Pain Type  Chronic pain    Pain Onset  More than a month ago    Aggravating Factors   any prolong activity    Pain Relieving Factors  at rest                       [facility] Adult PT Treatment/Exercise - 06/22/18 0001      Moist Heat Therapy   Number Minutes Moist Heat  10 Minutes    Moist Heat Location  Lumbar Spine   right thoracic  Fax:  [phone]  Name: Jacob Kerr MRN: [id number] Date of Birth: November 05, 1950  [facility] [place], [place], [address] Phone: [phone]   Fax:  [phone]  Physical Therapy Treatment  Patient Details  Name: Jacob Kerr MRN: [id number] Date of Birth: [birth year] Referring Provider (PT): Hulan Saas    Encounter Date: 06/22/2018  PT End of Session - 06/22/18 1553    Visit Number  9    Number of Visits  12    Date for PT Re-Evaluation  06/29/18    PT Start Time  1532    PT Stop Time  1601    PT Time Calculation (min)  29 min    Activity Tolerance  Patient tolerated treatment well    Behavior During Therapy  [facility] for tasks assessed/performed       Past Medical History:  Diagnosis Date  . Acute meniscal tear of left knee   . Allergic rhinitis   . BPH (benign prostatic hypertrophy)   . Cervical spondylosis without myelopathy   . Cervicogenic headache   . Chronic neck pain    uses TENS unit  prn  . Diverticulitis   . GERD (gastroesophageal reflux disease)   . H/O hiatal hernia   . History of diverticulitis   . History of kidney stones   . History of TIA (transient ischemic attack)    04/ 2011---  no residuals  . Hypertension   . Hypothyroidism   . Peripheral neuropathy   . Shingles   . Syrinx of spinal cord ([place])    C4 -- C7    Past Surgical History:  Procedure Laterality Date  . CARDIAC CATHETERIZATION  02-18-2010   dr Angelena Form   normal coronary arteries/  normal lvsf--  ef 65%  . COLONOSCOPY    . [place]  . KNEE ARTHROSCOPY Left 03/13/2014   Procedure: LEFT ARTHROSCOPY KNEE WITH DEBRIDMENT;  Surgeon: Gearlean Alf, MD;  Location: [facility];  Service: Orthopedics;  Laterality: Left;  . LAPAROSCOPIC CHOLECYSTECTOMY  01-30-2003  . NEGATIVE SLEEP STUDY  2013   per pt  . RADIOFREQUENCY ABLATION NERVES  05/2016  . REMOVAL FORGEIN BODY FROM EAR  AGE 50  . RIGHT URETEROSCOPIC LASER LITHOTRIPSY STONE EXTRACTION /  STENT PLACEMENT  05-10-2000  .  TRANSTHORACIC ECHOCARDIOGRAM  01-16-2010   normal lvf/  ef  60%/  mild lae    There were no vitals filed for this visit.  Subjective Assessment - 06/22/18 1535    Subjective  Patient arrived late, still not feeling well today and ongoing pain    Pertinent History  Peripheral neuropathy; headaches; left knee scope; neck pain; TIA.    Patient Stated Goals  Lift grandchildren with less pain.    Currently in Pain?  Yes    Pain Score  8     Pain Location  Thoracic    Pain Orientation  Right;Mid    Pain Descriptors / Indicators  Discomfort    Pain Type  Chronic pain    Pain Onset  More than a month ago    Aggravating Factors   any prolong activity    Pain Relieving Factors  at rest                       [facility] Adult PT Treatment/Exercise - 06/22/18 0001      Moist Heat Therapy   Number Minutes Moist Heat  10 Minutes    Moist Heat Location  Lumbar Spine   right thoracic

## 2018-06-27 ENCOUNTER — Ambulatory Visit: Payer: Medicare Other | Attending: Family Medicine | Admitting: *Deleted

## 2018-06-27 DIAGNOSIS — M546 Pain in thoracic spine: Secondary | ICD-10-CM | POA: Insufficient documentation

## 2018-06-27 NOTE — Therapy (Signed)
[facility] [place], [place], [address] Phone: [phone]   Fax:  [phone]  Physical Therapy Treatment  Patient Details  Name: Jacob Kerr MRN: [id number] Date of Birth: 27-Oct-1950 Referring Provider (PT): Hulan Saas    Encounter Date: 06/27/2018  PT End of Session - 06/27/18 1439    Visit Number  10    Number of Visits  12    Date for PT Re-Evaluation  06/29/18    PT Start Time  1440    PT Stop Time  1530    PT Time Calculation (min)  50 min    Activity Tolerance  Patient tolerated treatment well    Behavior During Therapy  [facility] for tasks assessed/performed       Past Medical History:  Diagnosis Date  . Acute meniscal tear of left knee   . Allergic rhinitis   . BPH (benign prostatic hypertrophy)   . Cervical spondylosis without myelopathy   . Cervicogenic headache   . Chronic neck pain    uses TENS unit  prn  . Diverticulitis   . GERD (gastroesophageal reflux disease)   . H/O hiatal hernia   . History of diverticulitis   . History of kidney stones   . History of TIA (transient ischemic attack)    04/ 2011---  no residuals  . Hypertension   . Hypothyroidism   . Peripheral neuropathy   . Shingles   . Syrinx of spinal cord ([place])    C4 -- C7    Past Surgical History:  Procedure Laterality Date  . CARDIAC CATHETERIZATION  02-18-2010   dr Angelena Form   normal coronary arteries/  normal lvsf--  ef 65%  . COLONOSCOPY    . [place]  . KNEE ARTHROSCOPY Left 03/13/2014   Procedure: LEFT ARTHROSCOPY KNEE WITH DEBRIDMENT;  Surgeon: Gearlean Alf, MD;  Location: [facility];  Service: Orthopedics;  Laterality: Left;  . LAPAROSCOPIC CHOLECYSTECTOMY  01-30-2003  . NEGATIVE SLEEP STUDY  2013   per pt  . RADIOFREQUENCY ABLATION NERVES  05/2016  . REMOVAL FORGEIN BODY FROM EAR  AGE 67  . RIGHT URETEROSCOPIC LASER LITHOTRIPSY STONE EXTRACTION /  STENT PLACEMENT  05-10-2000  .  TRANSTHORACIC ECHOCARDIOGRAM  01-16-2010   normal lvf/  ef  60%/  mild lae    There were no vitals filed for this visit.  Subjective Assessment - 06/27/18 1552    Subjective  Did ok after last Rx. Rxs are helping    Pertinent History  Peripheral neuropathy; headaches; left knee scope; neck pain; TIA.    Patient Stated Goals  Lift grandchildren with less pain.    Currently in Pain?  Yes    Pain Score  7     Pain Location  Thoracic    Pain Orientation  Right;Mid    Pain Descriptors / Indicators  Discomfort    Pain Onset  More than a month ago    Pain Frequency  Intermittent                       [facility] Adult PT Treatment/Exercise - 06/27/18 0001      Exercises   Exercises  Shoulder;Lumbar  (Pended)       Modalities   Modalities  Electrical Stimulation;Moist Heat;Ultrasound  (Pended)       Moist Heat Therapy   Number Minutes Moist Heat  10 Minutes  (Pended)     Moist Heat Location  Lumbar Spine  (Pended)       Electrical Stimulation   Electrical Stimulation Location  Thoracic paraspinals IFC 80-150hz  x 15 mins  (Pended)     Electrical Stimulation Goals  Pain  (Pended)       Manual Therapy   Manual Therapy  Myofascial release;Soft tissue mobilization  (Pended)     Manual therapy comments  STW/MFR to right scapula medial border and sub-scap musculature with lift with Pt sidelying  (Pended)                   PT Long Term Goals - 06/06/18 1410      PT LONG TERM GOAL #1   Title  Independent with a HEP.    Time  6    Period  Weeks    Status  On-going      PT LONG TERM GOAL #2   Title  Perform ADL's with pain not > 4/10.    Time  6    Period  Weeks    Status  On-going   no change 05/25/18     PT LONG TERM GOAL #3   Title  Lift grandchildren with pain not > 4/10.    Time  6    Period  Weeks    Status  On-going   7-9/10 at times per patient report 06/06/2018           Plan - 06/27/18 1440    Clinical Impression Statement  Pt arrived  today with same complaints with pain under RT shoulder blade. Rx consisted of [place] combo to medial border RT scapula f/b scapular lift and STW medial border and subscap. Normal response to heat and e-stim. Pt continues to get relief for several hours from Rxs , but the pain returns later.    Clinical Presentation  Unstable    Clinical Decision Making  Moderate    Rehab Potential  Fair    PT Frequency  2x / week    PT Duration  6 weeks    PT Treatment/Interventions  ADLs/Self Care Home Management;Cryotherapy;Electrical Stimulation;Ultrasound;Traction;Moist Heat;Therapeutic activities;Therapeutic exercise;Balance training;Neuromuscular re-education;Patient/family education;Manual techniques    PT Next Visit Plan  cont with POC for IFC and HMP; core exercises; Combo e'stim/U/S and STW/M.  FALL RISK. assess goals/10th visit progress    Consulted and Agree with Plan of Care  Patient       Patient will benefit from skilled therapeutic intervention in order to improve the following deficits and impairments:  Abnormal gait, Decreased activity tolerance, Decreased balance, Pain  Visit Diagnosis: Pain in thoracic spine     Problem List Patient Active Problem List   Diagnosis Date Noted  . Anxiety 10/04/2017  . Nonallopathic lesion of rib cage 06/15/2017  . Nonallopathic lesion of cervical region 05/18/2017  . Nonallopathic lesion of thoracic region 05/18/2017  . Nonallopathic lesion of lumbosacral region 05/18/2017  . Arrhythmia 10/08/2014  . TIA (transient ischemic attack) 08/09/2014  . Lateral meniscal tear 03/12/2014  . Testosterone deficiency 07/04/2013  . Cervicogenic headache 03/28/2013  . Vitamin D deficiency 01/01/2013  . Hyperlipidemia 01/01/2013  . BPH (benign prostatic hyperplasia) 11/03/2012  . Diverticulosis large intestine w/o perforation or abscess w/bleeding 11/03/2012  . Peripheral neuropathy 11/03/2012  . Unspecified hypothyroidism 01/18/2011  . Essential hypertension  01/18/2011  . GERD (gastroesophageal reflux disease) 01/18/2011  . Cervical spondylosis without myelopathy 11/24/2010  . Headache(784.0) 11/24/2010  . Sleep disturbance, unspecified 11/24/2010  . Syringomyelia and syringobulbia ([place]) 11/24/2010  . Disturbance of  skin sensation 11/24/2010  . Cerebrovascular disease, unspecified 11/24/2010    RAMSEUR,CHRIS, PTA 06/27/2018, 3:53 PM  [facility] [address] [place], [place], [address] Phone: [phone]   Fax:  [phone]  Name: Jacob Kerr MRN: [id number] Date of Birth: 11/17/1950

## 2018-06-29 ENCOUNTER — Encounter: Payer: Medicare Other | Admitting: *Deleted

## 2018-07-04 ENCOUNTER — Encounter: Payer: Medicare Other | Admitting: *Deleted

## 2018-07-06 ENCOUNTER — Other Ambulatory Visit: Payer: Self-pay | Admitting: *Deleted

## 2018-07-06 ENCOUNTER — Encounter: Payer: Medicare Other | Admitting: *Deleted

## 2018-07-06 MED ORDER — VENLAFAXINE HCL ER 75 MG PO CP24: 75.0000 mg | ORAL_CAPSULE | Freq: Every day | ORAL | 0 refills | Status: DC

## 2018-07-11 ENCOUNTER — Ambulatory Visit: Payer: Medicare Other | Admitting: *Deleted

## 2018-07-11 DIAGNOSIS — M546 Pain in thoracic spine: Secondary | ICD-10-CM | POA: Diagnosis not present

## 2018-07-11 NOTE — Therapy (Deleted)
[facility] [place], [place], [address] Phone: [phone]   Fax:  [phone]  July 11, 2018   @[email or web address] @  Physical Therapy Discharge Summary  Patient: Jacob Kerr  MRN: [id number]  Date of Birth: [birth year]   Diagnosis: Pain in thoracic spine Referring Provider (PT): Hulan Saas    The above patient had been seen in Physical Therapy *** times of *** treatments scheduled with *** no shows and *** cancellations.  The treatment consisted of *** The patient is: {improved/worse/unchanged:3041574}  Subjective: ***  Discharge Findings: ***  Functional Status at Discharge: ***  {SHUOH:7290211}    Sincerely,   RAMSEUR,CHRIS, PTA   CC @[email or web address] @  [facility] [place], [place], [address] Phone: [phone]   Fax:  [phone]  Patient: Jacob Kerr  MRN: [id number]  Date of Birth: [birth year]

## 2018-07-11 NOTE — Therapy (Signed)
[facility] [address] [place], [place], [address] Phone: [phone]   Fax:  [phone]  Physical Therapy Treatment  Patient Details  Name: Jacob Kerr MRN: [id number] Date of Birth: 26-Dec-1950 Referring Provider (PT): Antoine Primas    Encounter Date: 07/11/2018  PT End of Session - 07/11/18 1433    Visit Number  11    Number of Visits  12    Date for PT Re-Evaluation  06/29/18    PT Start Time  1433    PT Stop Time  1524    PT Time Calculation (min)  51 min       Past Medical History:  Diagnosis Date  . Acute meniscal tear of left knee   . Allergic rhinitis   . BPH (benign prostatic hypertrophy)   . Cervical spondylosis without myelopathy   . Cervicogenic headache   . Chronic neck pain    uses TENS unit  prn  . Diverticulitis   . GERD (gastroesophageal reflux disease)   . H/O hiatal hernia   . History of diverticulitis   . History of kidney stones   . History of TIA (transient ischemic attack)    04/ 2011---  no residuals  . Hypertension   . Hypothyroidism   . Peripheral neuropathy   . Shingles   . Syrinx of spinal cord (HCC)    C4 -- C7    Past Surgical History:  Procedure Laterality Date  . CARDIAC CATHETERIZATION  02-18-2010   dr Clifton James   normal coronary arteries/  normal lvsf--  ef 65%  . COLONOSCOPY    . HEMORRHOID SURGERY  1992  . KNEE ARTHROSCOPY Left 03/13/2014   Procedure: LEFT ARTHROSCOPY KNEE WITH DEBRIDMENT;  Surgeon: Loanne Drilling, MD;  Location: [facility];  Service: Orthopedics;  Laterality: Left;  . LAPAROSCOPIC CHOLECYSTECTOMY  01-30-2003  . NEGATIVE SLEEP STUDY  2013   per pt  . RADIOFREQUENCY ABLATION NERVES  05/2016  . REMOVAL FORGEIN BODY FROM EAR  AGE 30  . RIGHT URETEROSCOPIC LASER LITHOTRIPSY STONE EXTRACTION /  STENT PLACEMENT  05-10-2000  . TRANSTHORACIC ECHOCARDIOGRAM  01-16-2010   normal lvf/  ef  60%/  mild lae    There were no vitals filed for this  visit.  Subjective Assessment - 07/11/18 1432    Subjective  Did ok after last Rx. Using TENS at home    Pertinent History  Peripheral neuropathy; headaches; left knee scope; neck pain; TIA.    Patient Stated Goals  Lift grandchildren with less pain.    Currently in Pain?  Yes    Pain Score  7                        [facility] Adult PT Treatment/Exercise - 07/11/18 0001      Exercises   Exercises  Shoulder;Lumbar      Modalities   Modalities  Electrical Stimulation;Moist Heat;Ultrasound      Moist Heat Therapy   Number Minutes Moist Heat  20 Minutes      Electrical Stimulation   Electrical Stimulation Location  Thoracic paraspinals IFC 80-150hz  x 15 mins    Electrical Stimulation Action  supine    Electrical Stimulation Goals  Pain      Ultrasound   Ultrasound Location   Medial border Rt scapula [place] Combo x 12 mins 1.5 w/cm2     Ultrasound Goals  Pain      Manual Therapy  Manual Therapy  Myofascial release;Soft tissue mobilization    Manual therapy comments  STW/MFR to right scapula medial border and sub-scap musculature with lift with Pt sidelying                  PT Long Term Goals - 06/06/18 1410      PT LONG TERM GOAL #1   Title  Independent with a HEP.    Time  6    Period  Weeks    Status  On-going      PT LONG TERM GOAL #2   Title  Perform ADL's with pain not > 4/10.    Time  6    Period  Weeks    Status  On-going   no change 05/25/18     PT LONG TERM GOAL #3   Title  Lift grandchildren with pain not > 4/10.    Time  6    Period  Weeks    Status  On-going   7-9/10 at times per patient report 06/06/2018           Plan - 07/11/18 1645    Clinical Impression Statement  Pt arrived today doing about the same with pain levels 7/10. He continues to get relief from PT, but short lived. Rx still focused on [place] combo and STW periscapular tissues and scapular lifts/mobs f/b estim and MHP.  Decreased pain after Rx    Clinical  Presentation  Unstable    Clinical Decision Making  Moderate    Rehab Potential  Fair    PT Frequency  2x / week    PT Duration  6 weeks    PT Treatment/Interventions  ADLs/Self Care Home Management;Cryotherapy;Electrical Stimulation;Ultrasound;Traction;Moist Heat;Therapeutic activities;Therapeutic exercise;Balance training;Neuromuscular re-education;Patient/family education;Manual techniques    PT Next Visit Plan  cont with POC for IFC and HMP; core exercises; Combo e'stim/U/S and STW/M.  FALL RISK. assess goals/10th visit progress    Consulted and Agree with Plan of Care  Patient       Patient will benefit from skilled therapeutic intervention in order to improve the following deficits and impairments:  Abnormal gait, Decreased activity tolerance, Decreased balance, Pain  Visit Diagnosis: Pain in thoracic spine     Problem List Patient Active Problem List   Diagnosis Date Noted  . Anxiety 10/04/2017  . Nonallopathic lesion of rib cage 06/15/2017  . Nonallopathic lesion of cervical region 05/18/2017  . Nonallopathic lesion of thoracic region 05/18/2017  . Nonallopathic lesion of lumbosacral region 05/18/2017  . Arrhythmia 10/08/2014  . TIA (transient ischemic attack) 08/09/2014  . Lateral meniscal tear 03/12/2014  . Testosterone deficiency 07/04/2013  . Cervicogenic headache 03/28/2013  . Vitamin D deficiency 01/01/2013  . Hyperlipidemia 01/01/2013  . BPH (benign prostatic hyperplasia) 11/03/2012  . Diverticulosis large intestine w/o perforation or abscess w/bleeding 11/03/2012  . Peripheral neuropathy 11/03/2012  . Unspecified hypothyroidism 01/18/2011  . Essential hypertension 01/18/2011  . GERD (gastroesophageal reflux disease) 01/18/2011  . Cervical spondylosis without myelopathy 11/24/2010  . Headache(784.0) 11/24/2010  . Sleep disturbance, unspecified 11/24/2010  . Syringomyelia and syringobulbia (HCC) 11/24/2010  . Disturbance of skin sensation 11/24/2010  .  Cerebrovascular disease, unspecified 11/24/2010    Arley Salamone,CHRIS, PTA 07/11/2018, 4:56 PM  [facility] [address] [place], [place], [address] Phone: [phone]   Fax:  [phone]  Name: Jacob Kerr MRN: [id number] Date of Birth: [birth year]

## 2018-07-12 ENCOUNTER — Other Ambulatory Visit: Payer: Self-pay | Admitting: *Deleted

## 2018-07-12 MED ORDER — LEVOTHYROXINE SODIUM 175 MCG PO TABS: ORAL_TABLET | ORAL | 0 refills | Status: DC

## 2018-07-12 NOTE — Addendum Note (Signed)
Addended by: Dajiah Kooi, [place] W on: 07/12/2018 01:11 PM   Modules accepted: Orders

## 2018-07-13 ENCOUNTER — Ambulatory Visit: Payer: Medicare Other | Admitting: *Deleted

## 2018-07-17 ENCOUNTER — Other Ambulatory Visit: Payer: Self-pay | Admitting: Family Medicine

## 2018-07-21 DIAGNOSIS — Z23 Encounter for immunization: Secondary | ICD-10-CM | POA: Diagnosis not present

## 2018-08-17 ENCOUNTER — Other Ambulatory Visit: Payer: Self-pay | Admitting: Family Medicine

## 2018-08-18 ENCOUNTER — Other Ambulatory Visit: Payer: Self-pay | Admitting: *Deleted

## 2018-08-18 DIAGNOSIS — E039 Hypothyroidism, unspecified: Secondary | ICD-10-CM

## 2018-08-18 MED ORDER — LEVOTHYROXINE SODIUM 175 MCG PO TABS: ORAL_TABLET | ORAL | 0 refills | Status: DC

## 2018-08-18 NOTE — Telephone Encounter (Signed)
NTBS.

## 2018-09-22 ENCOUNTER — Other Ambulatory Visit: Payer: Self-pay | Admitting: *Deleted

## 2018-09-22 NOTE — Telephone Encounter (Signed)
Pt scheduled follow up with Dr Lajuana Ripple 10/05/18 at 11:00.

## 2018-09-22 NOTE — Telephone Encounter (Signed)
Pt ntbs for refill of controlled substance per office policy/protocol.

## 2018-10-03 ENCOUNTER — Telehealth: Payer: Medicare Other | Admitting: Physician Assistant

## 2018-10-03 DIAGNOSIS — J019 Acute sinusitis, unspecified: Secondary | ICD-10-CM

## 2018-10-03 DIAGNOSIS — B9689 Other specified bacterial agents as the cause of diseases classified elsewhere: Secondary | ICD-10-CM | POA: Diagnosis not present

## 2018-10-03 MED ORDER — DOXYCYCLINE HYCLATE 100 MG PO CAPS: 100.0000 mg | ORAL_CAPSULE | Freq: Two times a day (BID) | ORAL | 0 refills | Status: AC

## 2018-10-03 NOTE — Progress Notes (Signed)

## 2018-10-05 ENCOUNTER — Ambulatory Visit (INDEPENDENT_AMBULATORY_CARE_PROVIDER_SITE_OTHER): Payer: Medicare Other | Admitting: Family Medicine

## 2018-10-05 VITALS — BP 127/79 | HR 91 | Temp 97.1°F | Ht 72.0 in | Wt 196.0 lb

## 2018-10-05 DIAGNOSIS — I10 Essential (primary) hypertension: Secondary | ICD-10-CM

## 2018-10-05 DIAGNOSIS — F419 Anxiety disorder, unspecified: Secondary | ICD-10-CM | POA: Diagnosis not present

## 2018-10-05 DIAGNOSIS — E039 Hypothyroidism, unspecified: Secondary | ICD-10-CM | POA: Diagnosis not present

## 2018-10-05 MED ORDER — VENLAFAXINE HCL ER 75 MG PO CP24: 75.0000 mg | ORAL_CAPSULE | Freq: Every day | ORAL | 1 refills | Status: DC

## 2018-10-05 MED ORDER — AZELASTINE HCL 0.1 % NA SOLN: 2.0000 | Freq: Two times a day (BID) | NASAL | 1 refills | Status: DC

## 2018-10-05 NOTE — Patient Instructions (Signed)
Good luck with [facility]. I hope that they help your back.  You had labs performed today.  You will be contacted with the results of the labs once they are available, usually in the next 3 business days for routine lab work.

## 2018-10-05 NOTE — Progress Notes (Signed)
Subjective: CC: anxiety follow up PCP: Chipper Herb, MD PJA:[id number] R Deneault is a 68 y.o. male presenting to clinic today for:  1. Anxiety Patient here for follow-up on anxiety.  He reports that he was started on Effexor 75 mg about a year ago because of an anxiety attack that happened around Christmas.  He notes that the medication continues to do well to control symptoms and that he finds joy in spending time with his grandchildren.  He is somewhat stressed out by his wife sometimes but otherwise tends to do okay.  2.  Sinus infection Patient reports that he has had sinus symptoms for almost 2 weeks now that he is describes as rhinorrhea, sinus congestion and sinus pressure.  He reports low-grade fevers 99 F at home.  Additionally, he states that he had an ED visit yesterday and was started on doxycycline.  He has not felt much improvement but has not been on this even 24 hours yet.  3.  Hypothyroidism Patient reports a 30-year history of hypothyroidism.  No history of radiation or surgery to the neck.  He does report alternating constipation diarrhea.  Energy is fair.  He certainly does not feel as energetic as he did when he was younger but feels that this is normal for age.  Denies any tremoring.  He reports compliance with generic Synthroid.  Family history significant for thyroid disorder in multiple family members.   ROS: Per HPI  Allergies  Allergen Reactions  . Cymbalta [Duloxetine Hcl] Other (See Comments)    Depression  . Gabapentin Other (See Comments)    nightmares  . Lyrica [Pregabalin] Other (See Comments)    Paranoid  . Robaxin [Methocarbamol] Other (See Comments)    Seeing "bright lights"  . Sudafed [Pseudoephedrine Hcl] Other (See Comments)    Increased heart rate  . Epinephrine Palpitations   Past Medical History:  Diagnosis Date  . Acute meniscal tear of left knee   . Allergic rhinitis   . BPH (benign prostatic hypertrophy)   . Cervical spondylosis  without myelopathy   . Cervicogenic headache   . Chronic neck pain    uses TENS unit  prn  . Diverticulitis   . GERD (gastroesophageal reflux disease)   . H/O hiatal hernia   . History of diverticulitis   . History of kidney stones   . History of TIA (transient ischemic attack)    04/ 2011---  no residuals  . Hypertension   . Hypothyroidism   . Peripheral neuropathy   . Shingles   . Syrinx of spinal cord (HCC)    C4 -- C7    Current Outpatient Medications:  .  acetaZOLAMIDE (DIAMOX) 125 MG tablet, Take 1 tablet (125 mg total) by mouth 2 (two) times daily., Disp: 180 tablet, Rfl: 1 .  Alpha-Lipoic Acid 600 MG CAPS, Take by mouth., Disp: , Rfl:  .  amLODipine (NORVASC) 10 MG tablet, TAKE 1 TABLET BY MOUTH  DAILY, Disp: 90 tablet, Rfl: 1 .  aspirin 81 MG tablet, Take 81 mg by mouth at bedtime. , Disp: , Rfl:  .  Coenzyme Q10 (CO Q 10) 100 MG CAPS, Take 1 capsule by mouth daily., Disp: , Rfl:  .  doxycycline (VIBRAMYCIN) 100 MG capsule, Take 1 capsule (100 mg total) by mouth 2 (two) times daily for 10 days., Disp: 20 capsule, Rfl: 0 .  levothyroxine (SYNTHROID, LEVOTHROID) 175 MCG tablet, TAKE 1 TABLET BY MOUTH DAILY BEFORE BREAKFAST (Needs to be seen before  next refill), Disp: 90 tablet, Rfl: 0 .  LORazepam (ATIVAN) 0.5 MG tablet, Take 1 tablet (0.5 mg total) by mouth 2 (two) times daily as needed for anxiety., Disp: 60 tablet, Rfl: 2 .  losartan (COZAAR) 50 MG tablet, TAKE 1 TABLET BY MOUTH  DAILY, Disp: 90 tablet, Rfl: 1 .  NON FORMULARY, Take 3 capsules by mouth every morning. Nugenix Testosterone Booster / [place] , Disp: , Rfl:  .  omeprazole (PRILOSEC) 20 MG capsule, TAKE 1 CAPSULE BY MOUTH  TWICE DAILY (Patient taking differently: Take 20 mg by mouth 2 (two) times daily before a meal. ), Disp: 180 capsule, Rfl: 1 .  predniSONE (STERAPRED UNI-PAK 21 TAB) 10 MG (21) TBPK tablet, Take by mouth daily. Take 6 tabs by mouth daily  for 2 days, then 5 tabs for 2 days, then 4 tabs for 2 days,  then 3 tabs for 2 days, 2 tabs for 2 days, then 1 tab by mouth daily for 2 days, Disp: 42 tablet, Rfl: 0 .  UNABLE TO FIND, Med Name: CBD oil, Disp: , Rfl:  .  venlafaxine XR (EFFEXOR-XR) 75 MG 24 hr capsule, Take 1 capsule (75 mg total) by mouth daily with breakfast. (Needs to be seen before next refill), Disp: 90 capsule, Rfl: 0 Social History   Socioeconomic History  . Marital status: Married    Spouse name: Not on file  . Number of children: 2  . Years of education: 57  . Highest education level: Not on file  Occupational History  . Occupation: Retired    Fish farm manager: Chief of Staff  Social Needs  . Financial resource strain: Not on file  . Food insecurity:    Worry: Not on file    Inability: Not on file  . Transportation needs:    Medical: Not on file    Non-medical: Not on file  Tobacco Use  . Smoking status: Never Smoker  . Smokeless tobacco: Never Used  Substance and Sexual Activity  . Alcohol use: Yes    Alcohol/week: 0.0 standard drinks    Comment: occasionally  . Drug use: No  . Sexual activity: Not on file  Lifestyle  . Physical activity:    Days per week: Not on file    Minutes per session: Not on file  . Stress: Not on file  Relationships  . Social connections:    Talks on phone: Not on file    Gets together: Not on file    Attends religious service: Not on file    Active member of club or organization: Not on file    Attends meetings of clubs or organizations: Not on file    Relationship status: Not on file  . Intimate partner violence:    Fear of current or ex partner: Not on file    Emotionally abused: Not on file    Physically abused: Not on file    Forced sexual activity: Not on file  Other Topics Concern  . Not on file  Social History Narrative   Lives at home with wife.       Patient is right handed.   Patient drinks 1-2 cups of caffeine daily.   Family History  Problem Relation Age of Onset  . Prostate cancer Father   . Heart disease Father    . Stroke Father   . Nephrolithiasis Sister   . Cancer Sister        Intestinal  . Hyperlipidemia Sister   . Hyperlipidemia Sister   .  Cancer Sister        Sinus  . COPD Unknown   . Colon polyps Brother        x2  . Colon polyps Sister   . Colon cancer Neg Hx   . Esophageal cancer Neg Hx     Objective: Office vital signs reviewed. BP 127/79   Pulse 91   Temp (!) 97.1 F (36.2 C) (Oral)   Ht 6' (1.829 m)   Wt 196 lb (88.9 kg)   BMI 26.58 kg/m   Physical Examination:  General: Awake, alert, well nourished, No acute distress HEENT: Normal, sclera white, MMM Cardio: regular rate and rhythm, S1S2 heard, no murmurs appreciated Pulm: clear to auscultation bilaterally, no wheezes, rhonchi or rales; normal work of breathing on room air Extremities: warm, well perfused, No edema, cyanosis or clubbing; +2 pulses bilaterally Skin: dry; intact; normal temperature Neuro: no tremor appreciated Psych: Mood stable, speech normal, affect appropriate.  Eye contact fair.  Depression screen [facility] 2/9 10/05/2018 06/09/2018 03/07/2018  Decreased Interest 2 0 0  Down, Depressed, Hopeless 2 0 0  PHQ - 2 Score 4 0 0  Altered sleeping 2 - -  Tired, decreased energy 2 - -  Change in appetite 2 - -  Feeling bad or failure about yourself  1 - -  Trouble concentrating 1 - -  Moving slowly or fidgety/restless 1 - -  Suicidal thoughts 0 - -  PHQ-9 Score 13 - -  Difficult doing work/chores Somewhat difficult - -   GAD 7 : Generalized Anxiety Score 10/05/2018  Nervous, Anxious, on Edge 1  Control/stop worrying 0  Worry too much - different things 0  Trouble relaxing 3  Restless 3  Easily annoyed or irritable 2  Afraid - awful might happen 0  Total GAD 7 Score 9  Anxiety Difficulty Somewhat difficult    Assessment/ Plan: 68 y.o. male   1. Anxiety Seemingly stable per his report.  Continue Effexor 75 mg daily.  I would like him to return for reassessment in the next 3 to 6 months.  He may  need increased dose of Effexor as both his PHQ 9 and gad 7 scores were slightly elevated today.  2. Hypothyroidism, unspecified type Experiencing some symptoms with diarrhea and constipation.  Check thyroid panel. - Thyroid Panel With TSH  3. Essential hypertension Well-controlled on current regimen.  He was noted to be mildly hypokalemic on last metabolic panel and therefore this was reordered today. - Basic Metabolic Panel   Orders Placed This Encounter  Procedures  . Basic Metabolic Panel  . Thyroid Panel With TSH   Meds ordered this encounter  Medications  . venlafaxine XR (EFFEXOR-XR) 75 MG 24 hr capsule    Sig: Take 1 capsule (75 mg total) by mouth daily with breakfast.    Dispense:  90 capsule    Refill:  1  . azelastine (ASTELIN) 0.1 % nasal spray    Sig: Place 2 sprays into both nostrils 2 (two) times daily.    Dispense:  30 mL    Refill:  Runnels, Cyril [phone]

## 2018-10-06 ENCOUNTER — Other Ambulatory Visit: Payer: Self-pay | Admitting: Family Medicine

## 2018-10-06 LAB — BASIC METABOLIC PANEL
BUN / CREAT RATIO: 10 (ref 10–24)
BUN: 13 mg/dL (ref 8–27)
CHLORIDE: 103 mmol/L (ref 96–106)
CO2: 21 mmol/L (ref 20–29)
Calcium: 9.3 mg/dL (ref 8.6–10.2)
Creatinine, Ser: 1.34 mg/dL — ABNORMAL HIGH (ref 0.76–1.27)
GFR, EST AFRICAN AMERICAN: 63 mL/min/{1.73_m2} (ref >59)
GFR, EST NON AFRICAN AMERICAN: 54 mL/min/{1.73_m2} — AB (ref >59)
Glucose: 115 mg/dL — ABNORMAL HIGH (ref 65–99)
POTASSIUM: 4.4 mmol/L (ref 3.5–5.2)
Sodium: 140 mmol/L (ref 134–144)

## 2018-10-06 LAB — THYROID PANEL WITH TSH
FREE THYROXINE INDEX: 3.3 (ref 1.2–4.9)
T3 UPTAKE RATIO: 29 % (ref 24–39)
T4, Total: 11.4 ug/dL (ref 4.5–12.0)
TSH: 0.087 u[IU]/mL — ABNORMAL LOW (ref 0.450–4.500)

## 2018-10-06 MED ORDER — LEVOTHYROXINE SODIUM 150 MCG PO TABS: 150.0000 ug | ORAL_TABLET | Freq: Every day | ORAL | 0 refills | Status: DC

## 2018-10-10 ENCOUNTER — Telehealth: Payer: Self-pay | Admitting: *Deleted

## 2018-10-10 ENCOUNTER — Other Ambulatory Visit: Payer: Self-pay | Admitting: Family Medicine

## 2018-10-10 MED ORDER — LORAZEPAM 0.5 MG PO TABS: 0.5000 mg | ORAL_TABLET | Freq: Two times a day (BID) | ORAL | 0 refills | Status: DC | PRN

## 2018-10-10 NOTE — Telephone Encounter (Signed)
Pt aware.

## 2018-10-10 NOTE — Telephone Encounter (Signed)
Sent #30.  Further fills per PCP.

## 2018-10-10 NOTE — Telephone Encounter (Signed)
Pt forgot to ask for Ativan refill at appt. Please refill at [facility]

## 2018-10-18 ENCOUNTER — Other Ambulatory Visit: Payer: Self-pay | Admitting: *Deleted

## 2018-10-18 MED ORDER — OMEPRAZOLE 20 MG PO CPDR: 20.0000 mg | DELAYED_RELEASE_CAPSULE | Freq: Two times a day (BID) | ORAL | 1 refills | Status: DC

## 2018-11-02 ENCOUNTER — Other Ambulatory Visit: Payer: Self-pay | Admitting: Family Medicine

## 2018-11-06 ENCOUNTER — Encounter: Payer: Self-pay | Admitting: Family Medicine

## 2018-11-06 ENCOUNTER — Ambulatory Visit (INDEPENDENT_AMBULATORY_CARE_PROVIDER_SITE_OTHER): Payer: Medicare Other | Admitting: Family Medicine

## 2018-11-06 VITALS — BP 141/81 | HR 72 | Temp 97.3°F | Ht 72.0 in | Wt 203.0 lb

## 2018-11-06 DIAGNOSIS — E039 Hypothyroidism, unspecified: Secondary | ICD-10-CM

## 2018-11-06 DIAGNOSIS — G950 Syringomyelia and syringobulbia: Secondary | ICD-10-CM

## 2018-11-06 DIAGNOSIS — F419 Anxiety disorder, unspecified: Secondary | ICD-10-CM

## 2018-11-06 DIAGNOSIS — R7989 Other specified abnormal findings of blood chemistry: Secondary | ICD-10-CM | POA: Diagnosis not present

## 2018-11-06 MED ORDER — BACLOFEN 5 MG PO TABS: 5.0000 mg | ORAL_TABLET | Freq: Three times a day (TID) | ORAL | 1 refills | Status: DC

## 2018-11-06 MED ORDER — BACLOFEN 10 MG PO TABS: 10.0000 mg | ORAL_TABLET | Freq: Every day | ORAL | 0 refills | Status: DC

## 2018-11-06 NOTE — Progress Notes (Signed)
Subjective:    Patient ID: Jacob Kerr, male    DOB: 03/22/1951, 68 y.o.   MRN: [id number]  HPI Patient here today for 4- week follow up on thyroid and anxiety.  The patient's family has had some concerns about him with depression no energy and just not feeling well.  Apparently he had not been taking his thyroid medicine regularly and has started taking this more regularly and his depression scores have improved.  Also he has been taking Effexor and says that with taking Effexor he is not having take as much Lorazepam and he thinks this is helped some too.  Just to be on the safe side today we will check a thyroid profile and he has had some elevation of his creatinine in the past and we will recheck a BMP on him.  After reviewing his record and talking to his family I thought 1 option might be to increase his Effexor.  If he is having issues with his syringomyelia we could also try some baclofen which would serve as a muscle relaxant and help spasms as another option.  The TSH 1 month ago was actually low remaining too much thyroid medicine.  His thyroid medicine was decreased at that time.  The patient is pleasant and somewhat awkward movements because of his neck in the way it affects his whole right side from the syrinx.  He denies any chest pain or shortness of breath anymore than usual.  He tries to stay active because he does not want to give up and give into the problems with the syrinx.  He has seen a neurologist but they said there is nothing they can do about this.  He denies any trouble with burning or passing his water and his bowels are moving okay he does have occasional blood but he had a sigmoidoscopy 3 years ago and he has had some hemorrhoids.  After talking with him I feel that may be he might still benefit with a muscle relaxant and we will try a very low-dose of baclofen or Lioresal.   Patient Active Problem List   Diagnosis Date Noted  . Anxiety 10/04/2017  . Nonallopathic  lesion of rib cage 06/15/2017  . Nonallopathic lesion of cervical region 05/18/2017  . Nonallopathic lesion of thoracic region 05/18/2017  . Nonallopathic lesion of lumbosacral region 05/18/2017  . Arrhythmia 10/08/2014  . TIA (transient ischemic attack) 08/09/2014  . Lateral meniscal tear 03/12/2014  . Testosterone deficiency 07/04/2013  . Cervicogenic headache 03/28/2013  . Vitamin D deficiency 01/01/2013  . Hyperlipidemia 01/01/2013  . BPH (benign prostatic hyperplasia) 11/03/2012  . Diverticulosis large intestine w/o perforation or abscess w/bleeding 11/03/2012  . Peripheral neuropathy 11/03/2012  . Hypothyroidism 01/18/2011  . Essential hypertension 01/18/2011  . GERD (gastroesophageal reflux disease) 01/18/2011  . Cervical spondylosis without myelopathy 11/24/2010  . Headache(784.0) 11/24/2010  . Sleep disturbance, unspecified 11/24/2010  . Syringomyelia and syringobulbia ([place]) 11/24/2010  . Disturbance of skin sensation 11/24/2010  . Cerebrovascular disease, unspecified 11/24/2010   Outpatient Encounter Medications as of 11/06/2018  Medication Sig  . acetaZOLAMIDE (DIAMOX) 125 MG tablet Take 1 tablet (125 mg total) by mouth 2 (two) times daily.  . Alpha-Lipoic Acid 600 MG CAPS Take by mouth.  Marland Kitchen amLODipine (NORVASC) 10 MG tablet TAKE 1 TABLET BY MOUTH  DAILY  . aspirin 81 MG tablet Take 81 mg by mouth at bedtime.   Marland Kitchen azelastine (ASTELIN) 0.1 % nasal spray PLACE 2 SPRAYS INTO BOTH NOSTRILS 2 (  TWO) TIMES DAILY.  Marland Kitchen Coenzyme Q10 (CO Q 10) 100 MG CAPS Take 1 capsule by mouth daily.  Marland Kitchen levothyroxine (SYNTHROID, LEVOTHROID) 150 MCG tablet Take 1 tablet (150 mcg total) by mouth daily before breakfast.  . LORazepam (ATIVAN) 0.5 MG tablet Take 1 tablet (0.5 mg total) by mouth 2 (two) times daily as needed for anxiety.  Marland Kitchen losartan (COZAAR) 50 MG tablet TAKE 1 TABLET BY MOUTH  DAILY  . NON FORMULARY Take 3 capsules by mouth every morning. Nugenix Testosterone Booster / [place]   . omeprazole  (PRILOSEC) 20 MG capsule Take 1 capsule (20 mg total) by mouth 2 (two) times daily.  Marland Kitchen UNABLE TO FIND Med Name: CBD oil  . venlafaxine XR (EFFEXOR-XR) 75 MG 24 hr capsule Take 1 capsule (75 mg total) by mouth daily with breakfast.   No facility-administered encounter medications on file as of 11/06/2018.       Review of Systems  Constitutional: Negative.   HENT: Negative.   Eyes: Negative.   Respiratory: Negative.   Cardiovascular: Negative.   Gastrointestinal: Negative.   Endocrine: Negative.   Genitourinary: Negative.   Musculoskeletal: Negative.   Skin: Negative.   Allergic/Immunologic: Negative.   Neurological: Negative.   Hematological: Negative.   Psychiatric/Behavioral: Negative.        Objective:   Physical Exam Vitals signs and nursing note reviewed.  Constitutional:      Appearance: He is well-developed and normal weight. He is not ill-appearing.  HENT:     Head: Normocephalic.     Right Ear: External ear normal.     Left Ear: External ear normal.     Nose: Nose normal.  Eyes:     General: No scleral icterus.       Right eye: No discharge.        Left eye: No discharge.     Extraocular Movements: Extraocular movements intact.     Conjunctiva/sclera: Conjunctivae normal.     Pupils: Pupils are equal, round, and reactive to light.  Neck:     Musculoskeletal: Normal range of motion and neck supple. No muscular tenderness.     Thyroid: No thyromegaly.     Vascular: No carotid bruit.     Trachea: No tracheal deviation.  Cardiovascular:     Rate and Rhythm: Normal rate and regular rhythm.     Heart sounds: Normal heart sounds. No murmur.  Pulmonary:     Effort: Pulmonary effort is normal.     Breath sounds: Normal breath sounds. No wheezing or rales.  Abdominal:     General: Bowel sounds are normal.     Palpations: Abdomen is soft. There is no mass.     Tenderness: There is no abdominal tenderness.  Musculoskeletal: Normal range of motion.        General:  No tenderness.     Right lower leg: No edema.     Left lower leg: No edema.  Lymphadenopathy:     Cervical: No cervical adenopathy.  Skin:    General: Skin is warm and dry.     Findings: No rash.  Neurological:     Mental Status: He is alert and oriented to person, place, and time. Mental status is at baseline.     Cranial Nerves: No cranial nerve deficit.     Motor: No weakness.     Gait: Gait normal.     Deep Tendon Reflexes: Reflexes are normal and symmetric. Reflexes normal.     Comments: Constant neck and  body movement with patient sitting in front of me.  He says he sort of readjusting to the spasm he is feeling.  Psychiatric:        Mood and Affect: Mood normal.        Behavior: Behavior normal.        Thought Content: Thought content normal.        Judgment: Judgment normal.     Comments: Mood affect and behavior are normal for this patient    BP (!) 148/82 (BP Location: Left Arm)   Pulse 72   Temp (!) 97.3 F (36.3 C) (Oral)   Ht 6' (1.829 m)   Wt 203 lb (92.1 kg)   BMI 27.53 kg/m         Assessment & Plan:  1. Elevated serum creatinine -Continue to avoid NSAIDs. - BMP8+EGFR  2. Hypothyroidism, unspecified type -Patient had become hyper thyroid and his dose was recently reduced from 1 75-1 50 daily.  He does not feel as anxious. - Thyroid Panel With TSH  3. Anxiety -Patient was started on Effexor and this is also helped him feel better he is not taking as much Lorazepam.  4. Syringomyelia and syringobulbia (HCC) -Trial of baclofen 10 mg at bedtime.  Patient will take this for 7 to 10 days and get back in touch with [place] as far as how he is feeling after trying this to see if it may help some of the spasticity that he has been having in his neck and back.  Meds ordered this encounter  Medications  . DISCONTD: Baclofen 5 MG TABS    Sig: Take 5 mg by mouth 3 (three) times daily.    Dispense:  90 tablet    Refill:  1  . baclofen (LIORESAL) 10 MG tablet     Sig: Take 1 tablet (10 mg total) by mouth at bedtime.    Dispense:  10 each    Refill:  0     Patient Instructions  Start baclofen 10 mg 1 at bedtime Call us in 7 to 10 days to let us know how you are doing with this and if it is helping you rest better at night Please be aware that initially it may make you feel a little bit drowsy but have to take it for a few days some the drowsiness side effects would wear off Continue to follow-up with specialist that you are currently seeing as needed Continue to drink plenty of fluids and stay well-hydrated Continue to be careful and not to put self at risk for falling Depending on how you are feeling in 7 to 10 days we may increase the baclofen 10 mg twice daily. We will also have the results of your thyroid test and make sure that you are therapeutic on thyroid replacement   Arrie Senate MD

## 2018-11-06 NOTE — Patient Instructions (Addendum)
Start baclofen 10 mg 1 at bedtime Call us in 7 to 10 days to let us know how you are doing with this and if it is helping you rest better at night Please be aware that initially it may make you feel a little bit drowsy but have to take it for a few days some the drowsiness side effects would wear off Continue to follow-up with specialist that you are currently seeing as needed Continue to drink plenty of fluids and stay well-hydrated Continue to be careful and not to put self at risk for falling Depending on how you are feeling in 7 to 10 days we may increase the baclofen 10 mg twice daily. We will also have the results of your thyroid test and make sure that you are therapeutic on thyroid replacement

## 2018-11-07 LAB — BMP8+EGFR
BUN/Creatinine Ratio: 9 — ABNORMAL LOW (ref 10–24)
BUN: 11 mg/dL (ref 8–27)
CALCIUM: 9.8 mg/dL (ref 8.6–10.2)
CO2: 24 mmol/L (ref 20–29)
Chloride: 99 mmol/L (ref 96–106)
Creatinine, Ser: 1.29 mg/dL — ABNORMAL HIGH (ref 0.76–1.27)
GFR, EST AFRICAN AMERICAN: 66 mL/min/{1.73_m2} (ref >59)
GFR, EST NON AFRICAN AMERICAN: 57 mL/min/{1.73_m2} — AB (ref >59)
Glucose: 95 mg/dL (ref 65–99)
POTASSIUM: 4.1 mmol/L (ref 3.5–5.2)
Sodium: 140 mmol/L (ref 134–144)

## 2018-11-07 LAB — THYROID PANEL WITH TSH
Free Thyroxine Index: 2 (ref 1.2–4.9)
T3 Uptake Ratio: 25 % (ref 24–39)
T4, Total: 8.1 ug/dL (ref 4.5–12.0)
TSH: 0.186 u[IU]/mL — ABNORMAL LOW (ref 0.450–4.500)

## 2018-11-08 ENCOUNTER — Telehealth: Payer: Self-pay | Admitting: *Deleted

## 2018-11-08 NOTE — Telephone Encounter (Signed)
Pt called and aware

## 2018-11-08 NOTE — Telephone Encounter (Signed)
-----   Message from Memory Argue, PharmD sent at 11/08/2018 11:52 AM EST ----- It's find he may have some increased sedation with lorazepam but just make sure he is aware.  M ----- Message ----- From: Zannie Cove, LPN Sent: 4/51/4604  10:21 AM EST To: Memory Argue, PharmD  Dr Laurance Flatten started pt on Baclofen 10 mg 1 tab QHS a few days ago - we gave #10 and told him to call and report how it helps in 7-10 days.  He wants you to make sure that it works well with his other meds.  Thank you ! -Reginia Forts

## 2018-11-21 DIAGNOSIS — S63501A Unspecified sprain of right wrist, initial encounter: Secondary | ICD-10-CM | POA: Diagnosis not present

## 2018-11-21 DIAGNOSIS — M25531 Pain in right wrist: Secondary | ICD-10-CM | POA: Diagnosis not present

## 2018-11-21 DIAGNOSIS — M25521 Pain in right elbow: Secondary | ICD-10-CM | POA: Diagnosis not present

## 2018-11-27 ENCOUNTER — Encounter: Payer: Self-pay | Admitting: Family Medicine

## 2018-11-29 ENCOUNTER — Other Ambulatory Visit: Payer: Self-pay | Admitting: *Deleted

## 2018-11-29 MED ORDER — BACLOFEN 10 MG PO TABS: 10.0000 mg | ORAL_TABLET | Freq: Every day | ORAL | Status: DC

## 2018-12-05 ENCOUNTER — Encounter: Payer: Self-pay | Admitting: Family Medicine

## 2018-12-05 ENCOUNTER — Ambulatory Visit (INDEPENDENT_AMBULATORY_CARE_PROVIDER_SITE_OTHER): Payer: Medicare Other | Admitting: Family Medicine

## 2018-12-05 VITALS — BP 140/73 | HR 82 | Temp 97.3°F | Ht 72.0 in | Wt 200.0 lb

## 2018-12-05 DIAGNOSIS — G950 Syringomyelia and syringobulbia: Secondary | ICD-10-CM

## 2018-12-05 DIAGNOSIS — F419 Anxiety disorder, unspecified: Secondary | ICD-10-CM | POA: Diagnosis not present

## 2018-12-05 DIAGNOSIS — I10 Essential (primary) hypertension: Secondary | ICD-10-CM | POA: Diagnosis not present

## 2018-12-05 NOTE — Progress Notes (Signed)
Subjective:    Patient ID: Jacob Kerr, male    DOB: 01/03/1951, 68 y.o.   MRN: [id number]  HPI Patient here today for 1 month follow up on back pain, spasms and thyroid.  The patient did take the baclofen for several days.  Patient comes to the visit today to discuss the results of trying the baclofen for his low back spasms and neck spasms from the syringomyelia.  He said that the baclofen seem to cause bad dreams but he was not completely intolerant of this.  He has stopped it.  We will go back and consider trying this again since he said he would rather try this and try increasing the dose to see if it works rather than to try a mild muscle relaxant.  He will start back on the baclofen at 10 mg and will gradually increase this back to 15 and up to 25 a couple of weeks from now.  If the drains become intolerant he knows to stop the medicine.  He will continue with the Effexor 75 mg and in 2 weeks we will also get a repeat thyroid profile done at a lower dose of thyroid medicine then he was on 4 weeks ago.  He will be taken 150 mcg daily except 75 mcg on Sunday.  His daughter came in the room for [place] to have this discussion.  In the next 2 to 4 weeks if we have not made any accomplishments with helping him we will once again discuss the situation with Dr. Jannifer Franklin and see if he has any other recommendations by himself or in a referral way that we can do to help this man be more comfortable in his neck and back.   Patient Active Problem List   Diagnosis Date Noted  . Anxiety 10/04/2017  . Nonallopathic lesion of rib cage 06/15/2017  . Nonallopathic lesion of cervical region 05/18/2017  . Nonallopathic lesion of thoracic region 05/18/2017  . Nonallopathic lesion of lumbosacral region 05/18/2017  . Arrhythmia 10/08/2014  . TIA (transient ischemic attack) 08/09/2014  . Lateral meniscal tear 03/12/2014  . Testosterone deficiency 07/04/2013  . Cervicogenic headache 03/28/2013  . Vitamin D  deficiency 01/01/2013  . Hyperlipidemia 01/01/2013  . BPH (benign prostatic hyperplasia) 11/03/2012  . Diverticulosis large intestine w/o perforation or abscess w/bleeding 11/03/2012  . Peripheral neuropathy 11/03/2012  . Hypothyroidism 01/18/2011  . Essential hypertension 01/18/2011  . GERD (gastroesophageal reflux disease) 01/18/2011  . Cervical spondylosis without myelopathy 11/24/2010  . Headache(784.0) 11/24/2010  . Sleep disturbance, unspecified 11/24/2010  . Syringomyelia and syringobulbia ([place]) 11/24/2010  . Disturbance of skin sensation 11/24/2010  . Cerebrovascular disease, unspecified 11/24/2010   Outpatient Encounter Medications as of 12/05/2018  Medication Sig  . acetaZOLAMIDE (DIAMOX) 125 MG tablet Take 1 tablet (125 mg total) by mouth 2 (two) times daily.  . Alpha-Lipoic Acid 600 MG CAPS Take by mouth.  Marland Kitchen amLODipine (NORVASC) 10 MG tablet TAKE 1 TABLET BY MOUTH  DAILY  . aspirin 81 MG tablet Take 81 mg by mouth at bedtime.   Marland Kitchen azelastine (ASTELIN) 0.1 % nasal spray PLACE 2 SPRAYS INTO BOTH NOSTRILS 2 (TWO) TIMES DAILY.  Marland Kitchen Coenzyme Q10 (CO Q 10) 100 MG CAPS Take 1 capsule by mouth daily.  Marland Kitchen levothyroxine (SYNTHROID, LEVOTHROID) 150 MCG tablet Take 1 tablet (150 mcg total) by mouth daily before breakfast.  . LORazepam (ATIVAN) 0.5 MG tablet Take 1 tablet (0.5 mg total) by mouth 2 (two) times daily as needed for  anxiety.  Marland Kitchen losartan (COZAAR) 50 MG tablet TAKE 1 TABLET BY MOUTH  DAILY  . NON FORMULARY Take 3 capsules by mouth every morning. Nugenix Testosterone Booster / [place]   . omeprazole (PRILOSEC) 20 MG capsule Take 1 capsule (20 mg total) by mouth 2 (two) times daily.  Marland Kitchen UNABLE TO FIND Med Name: CBD oil  . venlafaxine XR (EFFEXOR-XR) 75 MG 24 hr capsule Take 1 capsule (75 mg total) by mouth daily with breakfast.  . baclofen (LIORESAL) 10 MG tablet Take 1 tablet (10 mg total) by mouth at bedtime. (Patient not taking: Reported on 12/05/2018)   No facility-administered  encounter medications on file as of 12/05/2018.      Review of Systems  Constitutional: Negative.   HENT: Negative.   Eyes: Negative.   Respiratory: Negative.   Cardiovascular: Negative.   Gastrointestinal: Negative.   Endocrine: Negative.   Genitourinary: Negative.   Musculoskeletal: Positive for back pain (and spams).  Skin: Negative.   Allergic/Immunologic: Negative.   Neurological: Negative.   Hematological: Negative.   Psychiatric/Behavioral: Negative.        Objective:   Physical Exam Constitutional:      General: He is not in acute distress.    Appearance: Normal appearance.     Comments: The patient was alert but obviously uncomfortable with his neck and back even while we are sitting in discussing the options that were available.  HENT:     Head: Normocephalic and atraumatic.     Nose: Nose normal.  Eyes:     Extraocular Movements: Extraocular movements intact.     Conjunctiva/sclera: Conjunctivae normal.     Pupils: Pupils are equal, round, and reactive to light.  Pulmonary:     Effort: Pulmonary effort is normal.  Skin:    Findings: No rash.  Neurological:     General: No focal deficit present.     Mental Status: He is alert and oriented to person, place, and time. Mental status is at baseline.     Gait: Gait normal.  Psychiatric:        Mood and Affect: Mood normal.        Behavior: Behavior normal.        Thought Content: Thought content normal.        Judgment: Judgment normal.     BP 140/73 (BP Location: Left Arm)   Pulse 82   Temp (!) 97.3 F (36.3 C) (Oral)   Ht 6' (1.829 m)   Wt 200 lb (90.7 kg)   BMI 27.12 kg/m        Assessment & Plan:  1. Anxiety -Continue with Effexor at 75 mg extended release daily  2. Essential hypertension -Continue with current blood pressure treatment  3. Syringomyelia and syringobulbia (HCC) -Retry baclofen and make adjustments with this in a couple weeks depending on his response  Patient Instructions   Retry the order cell at 10 mg nightly.  Increase to 15 mg nightly in 7 to 10 days and when he comes back in a couple weeks to see how he is doing we will ask him to increase this to 20 mg nightly. Patient will continue with Effexor 75 mg XR daily He will continue with the lower dose of levothyroxine at 150 mcg daily and 75 mcg on Sunday.  In a couple of weeks we will recheck his thyroid profile. He understands that if he has severe side effects with the baclofen that he should go ahead and stop it immediately but  we will try to up the dose and if we cannot get to that higher dose to see what kind of effect that can have we will stop the medicine. We will discuss with the neurologist other options for helping him to get better in 2 to 4 weeks if we have not made any progress with helping him with this.  Arrie Senate MD

## 2018-12-05 NOTE — Patient Instructions (Signed)
Retry the order cell at 10 mg nightly.  Increase to 15 mg nightly in 7 to 10 days and when he comes back in a couple weeks to see how he is doing we will ask him to increase this to 20 mg nightly. Patient will continue with Effexor 75 mg XR daily He will continue with the lower dose of levothyroxine at 150 mcg daily and 75 mcg on Sunday.  In a couple of weeks we will recheck his thyroid profile. He understands that if he has severe side effects with the baclofen that he should go ahead and stop it immediately but we will try to up the dose and if we cannot get to that higher dose to see what kind of effect that can have we will stop the medicine. We will discuss with the neurologist other options for helping him to get better in 2 to 4 weeks if we have not made any progress with helping him with this.

## 2018-12-20 ENCOUNTER — Encounter: Payer: Self-pay | Admitting: Family Medicine

## 2019-01-03 ENCOUNTER — Other Ambulatory Visit: Payer: Self-pay | Admitting: *Deleted

## 2019-01-03 MED ORDER — BACLOFEN 10 MG PO TABS: 10.0000 mg | ORAL_TABLET | Freq: Every day | ORAL | 1 refills | Status: DC

## 2019-01-08 DIAGNOSIS — D2372 Other benign neoplasm of skin of left lower limb, including hip: Secondary | ICD-10-CM | POA: Diagnosis not present

## 2019-01-08 DIAGNOSIS — D1801 Hemangioma of skin and subcutaneous tissue: Secondary | ICD-10-CM | POA: Diagnosis not present

## 2019-01-08 DIAGNOSIS — S20362A Insect bite (nonvenomous) of left front wall of thorax, initial encounter: Secondary | ICD-10-CM | POA: Diagnosis not present

## 2019-01-08 DIAGNOSIS — B079 Viral wart, unspecified: Secondary | ICD-10-CM | POA: Diagnosis not present

## 2019-01-08 DIAGNOSIS — L720 Epidermal cyst: Secondary | ICD-10-CM | POA: Diagnosis not present

## 2019-01-19 ENCOUNTER — Other Ambulatory Visit: Payer: Self-pay | Admitting: *Deleted

## 2019-01-19 MED ORDER — LEVOTHYROXINE SODIUM 150 MCG PO TABS: 150.0000 ug | ORAL_TABLET | Freq: Every day | ORAL | 0 refills | Status: DC

## 2019-01-19 NOTE — Progress Notes (Signed)
Pt requesting refill on Levothyroxine Refill sent to [facility] per Dr Laurance Flatten Pt needs repeat labs

## 2019-01-26 ENCOUNTER — Other Ambulatory Visit: Payer: Self-pay | Admitting: *Deleted

## 2019-01-26 MED ORDER — VENLAFAXINE HCL ER 75 MG PO CP24: 75.0000 mg | ORAL_CAPSULE | Freq: Every day | ORAL | 0 refills | Status: DC

## 2019-02-06 ENCOUNTER — Encounter: Payer: Self-pay | Admitting: *Deleted

## 2019-02-06 ENCOUNTER — Other Ambulatory Visit: Payer: Self-pay

## 2019-02-06 ENCOUNTER — Ambulatory Visit (INDEPENDENT_AMBULATORY_CARE_PROVIDER_SITE_OTHER): Payer: Medicare Other | Admitting: *Deleted

## 2019-02-06 DIAGNOSIS — Z0000 Encounter for general adult medical examination without abnormal findings: Secondary | ICD-10-CM | POA: Diagnosis not present

## 2019-02-06 NOTE — Progress Notes (Addendum)
MEDICARE ANNUAL WELLNESS VISIT  02/06/2019  Telephone Visit Disclaimer This Medicare AWV was conducted by telephone due to national recommendations for restrictions regarding the COVID-19 Pandemic (e.g. social distancing).  I verified, using two identifiers, that I am speaking with [name] or their authorized healthcare agent. I discussed the limitations, risks, security, and privacy concerns of performing an evaluation and management service by telephone and the potential availability of an in-person appointment in the future. The patient expressed understanding and agreed to proceed.   Subjective:  Jacob Kerr is a 68 y.o. male patient of Jacob Norlander, DO who had a Medicare Annual Wellness Visit today via telephone. Jacob Kerr is retired from [employer], and lives at home with his wife. he has 2 daughters and 5 grandchildren.  He and his wife babysit 3 of the grandchildren while his daughter works during the week.   he reports that he is socially active and does interact with friends/family regularly. he is moderately physically active walking and doing yoga.  He enjoys farming, bee keeping and babysitting his grandchildren.  Patient Care Team: Jacob Norlander, DO as PCP - General (Family Medicine) Luna Kitchens., MD (Neurosurgery) Sable Feil, MD (Gastroenterology) Kathrynn Ducking, MD (Neurology) Edrick Oh, MD (Nephrology) Minus Breeding, MD as Consulting Physician (Cardiology) Jacob Norlander, DO as Consulting Physician (Family Medicine)  Advanced Directives 02/06/2019 05/18/2018 02/02/2016 02/07/2015 08/09/2014 03/13/2014  Does Patient Have a Medical Advance Directive? No No No No No Patient does not have advance directive;Patient would like information  Would patient like information on creating a medical advance directive? Yes (MAU/Ambulatory/Procedural Areas - Information given) - - - - [facility] Utilization Over the Past  12 Months: # of hospitalizations or ER visits: 1 ER visit for weakness  # of surgeries: 0  Review of Systems    Patient reports that his overall health is unchanged compared to last year.    Review of Systems:  Musculoskeletal- right side neck pain  All other systems negative as reported by patient.  Pain Assessment Pain Score: 8      Current Medications & Allergies (verified) Allergies as of 02/06/2019      Reactions   Cymbalta [duloxetine Hcl] Other (See Comments)   Depression   Gabapentin Other (See Comments)   nightmares   Lyrica [pregabalin] Other (See Comments)   Paranoid   Robaxin [methocarbamol] Other (See Comments)   Seeing "bright lights"   Sudafed [pseudoephedrine Hcl] Other (See Comments)   Increased heart rate   Epinephrine Palpitations      Medication List       Accurate as of Feb 06, 2019  4:50 PM. If you have any questions, ask your nurse or doctor.        acetaZOLAMIDE 125 MG tablet Commonly known as:  DIAMOX Take 1 tablet (125 mg total) by mouth 2 (two) times daily.   Alpha-Lipoic Acid 600 MG Caps Take by mouth.   amLODipine 10 MG tablet Commonly known as:  NORVASC TAKE 1 TABLET BY MOUTH  DAILY   aspirin 81 MG tablet Take 81 mg by mouth at bedtime.   azelastine 0.1 % nasal spray Commonly known as:  ASTELIN PLACE 2 SPRAYS INTO BOTH NOSTRILS 2 (TWO) TIMES DAILY.   baclofen 10 MG tablet Commonly known as:  LIORESAL Take 1 tablet (10 mg total) by mouth at bedtime. What changed:  when to take this   Co Q  10 100 MG Caps Take 1 capsule by mouth daily.   levothyroxine 150 MCG tablet Commonly known as:  SYNTHROID Take 1 tablet (150 mcg total) by mouth daily before breakfast.   LORazepam 0.5 MG tablet Commonly known as:  ATIVAN Take 1 tablet (0.5 mg total) by mouth 2 (two) times daily as needed for anxiety.   losartan 50 MG tablet Commonly known as:  COZAAR TAKE 1 TABLET BY MOUTH  DAILY   NON FORMULARY Take 3 capsules by mouth  every morning. Nugenix Testosterone Booster / [employer]   omeprazole 20 MG capsule Commonly known as:  PRILOSEC Take 1 capsule (20 mg total) by mouth 2 (two) times daily.   UNABLE TO FIND Med Name: CBD oil   venlafaxine XR 75 MG 24 hr capsule Commonly known as:  EFFEXOR-XR Take 1 capsule (75 mg total) by mouth daily with breakfast.       History (reviewed): Past Medical History:  Diagnosis Date  . Acute meniscal tear of left knee   . Allergic rhinitis   . BPH (benign prostatic hypertrophy)   . Cervical spondylosis without myelopathy   . Cervicogenic headache   . Chronic neck pain    uses TENS unit  prn  . Diverticulitis   . GERD (gastroesophageal reflux disease)   . H/O hiatal hernia   . History of diverticulitis   . History of kidney stones   . History of TIA (transient ischemic attack)    04/ 2011---  no residuals  . Hypertension   . Hypothyroidism   . Peripheral neuropathy   . Shingles   . Syrinx of spinal cord ([place])    C4 -- C7   Past Surgical History:  Procedure Laterality Date  . CARDIAC CATHETERIZATION  02-18-2010   dr Angelena Form   normal coronary arteries/  normal lvsf--  ef 65%  . COLONOSCOPY    . [place]  . HERNIA REPAIR     [facility] [place] [facility]  . KNEE ARTHROSCOPY Left 03/13/2014   Procedure: LEFT ARTHROSCOPY KNEE WITH DEBRIDMENT;  Surgeon: Gearlean Alf, MD;  Location: [facility];  Service: Orthopedics;  Laterality: Left;  . LAPAROSCOPIC CHOLECYSTECTOMY  01-30-2003  . NEGATIVE SLEEP STUDY  2013   per pt  . RADIOFREQUENCY ABLATION NERVES  05/2016  . REMOVAL FORGEIN BODY FROM EAR  AGE 31  . RIGHT URETEROSCOPIC LASER LITHOTRIPSY STONE EXTRACTION /  STENT PLACEMENT  05-10-2000  . TRANSTHORACIC ECHOCARDIOGRAM  01-16-2010   normal lvf/  ef  60%/  mild lae   Family History  Problem Relation Age of Onset  . Prostate cancer Father   . Heart disease Father   . Stroke Father   . Nephrolithiasis Sister   . Cancer Sister         Intestinal  . Hyperlipidemia Sister   . Hyperlipidemia Sister   . Cancer Sister        Sinus  . COPD Other   . Colon polyps Brother        x2  . Colon polyps Sister   . Colon cancer Neg Hx   . Esophageal cancer Neg Hx    Social History   Socioeconomic History  . Marital status: Married    Spouse name: Not on file  . Number of children: 2  . Years of education: 33  . Highest education level: Not on file  Occupational History  . Occupation: Retired    Fish farm manager: Chief of Staff  Social Needs  .  Financial resource strain: Not hard at all  . Food insecurity:    Worry: Never true    Inability: Never true  . Transportation needs:    Medical: No    Non-medical: No  Tobacco Use  . Smoking status: Never Smoker  . Smokeless tobacco: Never Used  Substance and Sexual Activity  . Alcohol use: Yes    Alcohol/week: 7.0 standard drinks    Types: 7 Shots of liquor per week  . Drug use: No  . Sexual activity: Not on file  Lifestyle  . Physical activity:    Days per week: 6 days    Minutes per session: 30 min  . Stress: Only a little  Relationships  . Social connections:    Talks on phone: More than three times a week    Gets together: More than three times a week    Attends religious service: Never    Active member of club or organization: No    Attends meetings of clubs or organizations: Never    Relationship status: Married  Other Topics Concern  . Not on file  Social History Narrative   Lives at home with wife.       Patient is right handed.   Patient drinks 1-2 cups of caffeine daily.    Activities of Daily Living In your present state of health, do you have any difficulty performing the following activities: 02/06/2019  Hearing? N  Vision? N  Difficulty concentrating or making decisions? N  Walking or climbing stairs? N  Dressing or bathing? N  Doing errands, shopping? N  Preparing Food and eating ? N  Using the Toilet? N  In the past six months, have you  accidently leaked urine? N  Do you have problems with loss of bowel control? N  Managing your Medications? N  Managing your Finances? N  Housekeeping or managing your Housekeeping? N  Some recent data might be hidden        Exercise Current Exercise Habits: Home exercise routine, Type of exercise: yoga;walking, Time (Minutes): 30, Frequency (Times/Week): 6, Weekly Exercise (Minutes/Week): 180, Intensity: Moderate, Exercise limited by: neurologic condition(s);orthopedic condition(s)  Diet Patient reports consuming 2 meals a day and 1 snack(s) a day Patient reports that his primary diet is: Regular Patient reports that she does have regular access to food.   Depression Screen PHQ 2/9 Scores 02/06/2019 12/05/2018 11/06/2018 10/05/2018 06/09/2018 03/07/2018 12/23/2017  PHQ - 2 Score 0 1 3 4  0 0 1  PHQ- 9 Score - - 10 13 - - -     Fall Risk Fall Risk  02/06/2019 12/05/2018 11/06/2018 06/09/2018 03/07/2018  Falls in the past year? 1 1 1  No No  Number falls in past yr: 1 1 1  - -  Injury with Fall? 0 1 1 - -  Risk for fall due to : Impaired balance/gait - - - -  Follow up Falls prevention discussed;Education provided - - - -     Objective:  [name] seemed alert and oriented and he participated appropriately during our telephone visit.  Blood Pressure Weight BMI  BP Readings from Last 3 Encounters:  12/05/18 140/73  11/06/18 (!) 141/81  10/05/18 127/79   Wt Readings from Last 3 Encounters:  12/05/18 200 lb (90.7 kg)  11/06/18 203 lb (92.1 kg)  10/05/18 196 lb (88.9 kg)   BMI Readings from Last 1 Encounters:  12/05/18 27.12 kg/m    *Unable to obtain current vital signs, weight, and BMI due  to telephone visit type  Hearing/Vision  . Ashawn did not seem to have difficulty with hearing/understanding during the telephone conversation . Reports that he has had a formal eye exam by an eye care professional within the past year . Reports that he has not had a formal hearing  evaluation within the past year *Unable to fully assess hearing and vision during telephone visit type  Cognitive Function: 6CIT Screen 02/06/2019  What Year? 0 points  What month? 0 points  What time? 0 points  Count back from 20 0 points  Months in reverse 2 points  Repeat phrase 0 points  Total Score 2    Normal Cognitive Function Screening: Yes (Normal:0-7, Significant for Dysfunction: >8)  Immunization & Health Maintenance Record Immunization History  Administered Date(s) Administered  . Influenza Whole 06/27/2012  . Influenza, High Dose Seasonal PF 07/21/2018  . Influenza,inj,Quad PF,6+ Mos 07/04/2013, 07/12/2014, 07/31/2015, 08/18/2016  . Influenza-Unspecified 07/13/2017  . Pneumococcal Conjugate-13 03/07/2018  . Tdap 09/27/2005, 06/23/2011  . Zoster 05/20/2014    Health Maintenance  Topic Date Due  . COLON CANCER SCREENING ANNUAL FOBT  12/16/2016  . PNA vac Low Risk Adult (2 of 2 - PPSV23) 03/08/2019  . INFLUENZA VACCINE  04/28/2019  . TETANUS/TDAP  06/22/2021  . COLONOSCOPY  12/29/2024  . Hepatitis C Screening  Completed       Assessment  This is a routine wellness examination for [employer].  Health Maintenance: Due or Overdue Health Maintenance Due  Topic Date Due  . COLON CANCER SCREENING ANNUAL FOBT  12/16/2016    Cherylann Banas Dumont does not need a referral for Community Assistance: Care Management:   no Social Work:    no Prescription Assistance:  no Nutrition/Diabetes Education:  no   Plan:  Personalized Goals Goals Addressed            This Visit's Progress   . Weight (lb) < 195 lb (88.5 kg)       Increase activity - yoga and walking 150 minutes per week.      Personalized Health Maintenance & Screening Recommendations  Pneumococcal vaccine  Advanced directives: has NO advanced directive  - add't info requested. Referral to SW: no  Lung Cancer Screening Recommended: no (Low Dose CT Chest recommended if Age 64-80 years, 30  pack-year currently smoking OR have quit w/in past 15 years) Hepatitis C Screening recommended: completed 03/10/2018   Advanced Directives: Written information was prepared per patient's request.  Referrals & Orders N/A  Follow-up Plan . Follow-up with Jacob Norlander, DO as planned . Follow up with specialist physicians as scheduled  . Continue healthy diet and exercise regimen   I have personally reviewed and noted the following in the patient's chart:   . Medical and social history . Use of alcohol, tobacco or illicit drugs  . Current medications and supplements . Functional ability and status . Nutritional status . Physical activity . Advanced directives . List of other physicians . Hospitalizations, surgeries, and ER visits in previous 12 months . Vitals . Screenings to include cognitive, depression, and falls . Referrals and appointments  In addition, I have reviewed and discussed with [name] certain preventive protocols, quality metrics, and best practice recommendations. A written personalized care plan for preventive services as well as general preventive health recommendations is available and can be mailed to the patient at his request.      Nolberto Hanlon, RN  02/06/2019  I have reviewed and agree with  the above AWV documentation.   Terald Sleeper PA-C [facility] [address]  [place], [place] [address] [phone]

## 2019-02-06 NOTE — Patient Instructions (Signed)
  [name] , Thank you for taking time to talk with me for your Medicare Wellness Visit. I appreciate your ongoing commitment to your health goals. Please review the following plan we discussed and let me know if I can assist you in the future.   These are the goals we discussed: Goals    . Weight (lb) < 195 lb (88.5 kg)- patient states     Increase activity - yoga and walking at least 150 minutes per week. Decrease portion sizes.      This is a list of the screening recommended for you and due dates:  Health Maintenance  Topic Date Due  . Stool Blood Test  12/16/2016  . Pneumonia vaccines (2 of 2 - PPSV23) 03/08/2019  . Flu Shot  04/28/2019  . Tetanus Vaccine  06/22/2021  . Colon Cancer Screening  12/29/2024  .  Hepatitis C: One time screening is recommended by Center for Disease Control  (CDC) for  adults born from [birth year] through 1965.   Completed

## 2019-02-22 ENCOUNTER — Other Ambulatory Visit: Payer: Self-pay | Admitting: *Deleted

## 2019-02-22 MED ORDER — BACLOFEN 10 MG PO TABS: 10.0000 mg | ORAL_TABLET | Freq: Two times a day (BID) | ORAL | 1 refills | Status: DC

## 2019-02-28 ENCOUNTER — Other Ambulatory Visit: Payer: Self-pay | Admitting: Family Medicine

## 2019-03-16 ENCOUNTER — Encounter: Payer: Self-pay | Admitting: Family Medicine

## 2019-03-16 ENCOUNTER — Other Ambulatory Visit: Payer: Self-pay | Admitting: Family Medicine

## 2019-03-22 ENCOUNTER — Other Ambulatory Visit: Payer: Self-pay | Admitting: Family Medicine

## 2019-03-27 ENCOUNTER — Other Ambulatory Visit: Payer: Self-pay

## 2019-03-28 ENCOUNTER — Encounter: Payer: Self-pay | Admitting: Family Medicine

## 2019-03-28 ENCOUNTER — Ambulatory Visit (INDEPENDENT_AMBULATORY_CARE_PROVIDER_SITE_OTHER): Payer: Medicare Other | Admitting: Family Medicine

## 2019-03-28 VITALS — BP 140/83 | HR 76 | Temp 97.4°F | Ht 73.0 in | Wt 207.8 lb

## 2019-03-28 DIAGNOSIS — Z23 Encounter for immunization: Secondary | ICD-10-CM | POA: Diagnosis not present

## 2019-03-28 DIAGNOSIS — Z0000 Encounter for general adult medical examination without abnormal findings: Secondary | ICD-10-CM

## 2019-03-28 DIAGNOSIS — Z0001 Encounter for general adult medical examination with abnormal findings: Secondary | ICD-10-CM | POA: Diagnosis not present

## 2019-03-28 DIAGNOSIS — Z130 Encounter for screening for diseases of the blood and blood-forming organs and certain disorders involving the immune mechanism: Secondary | ICD-10-CM | POA: Diagnosis not present

## 2019-03-28 DIAGNOSIS — I10 Essential (primary) hypertension: Secondary | ICD-10-CM

## 2019-03-28 DIAGNOSIS — K219 Gastro-esophageal reflux disease without esophagitis: Secondary | ICD-10-CM | POA: Diagnosis not present

## 2019-03-28 DIAGNOSIS — E034 Atrophy of thyroid (acquired): Secondary | ICD-10-CM

## 2019-03-28 DIAGNOSIS — N400 Enlarged prostate without lower urinary tract symptoms: Secondary | ICD-10-CM

## 2019-03-28 DIAGNOSIS — F419 Anxiety disorder, unspecified: Secondary | ICD-10-CM

## 2019-03-28 DIAGNOSIS — R102 Pelvic and perineal pain unspecified side: Secondary | ICD-10-CM

## 2019-03-28 DIAGNOSIS — G950 Syringomyelia and syringobulbia: Secondary | ICD-10-CM | POA: Diagnosis not present

## 2019-03-28 MED ORDER — LEVOTHYROXINE SODIUM 150 MCG PO TABS: 150.0000 ug | ORAL_TABLET | Freq: Every day | ORAL | 1 refills | Status: DC

## 2019-03-28 MED ORDER — OMEPRAZOLE 20 MG PO CPDR: 20.0000 mg | DELAYED_RELEASE_CAPSULE | Freq: Two times a day (BID) | ORAL | 1 refills | Status: DC

## 2019-03-28 NOTE — Progress Notes (Signed)
Jacob Kerr is a 68 y.o. male presents to office today for annual physical exam examination.    Concerns today include: 1. Pelvic floor pain Patient reports longstanding history of pain in his pelvic floor.  He notes that his "testicles vibrate".  He attributes this to chronic low back pain.  He is seen a urologist for this previously and had pelvic floor rehabilitation which did seem to help.  He is interested in seeing the physical therapist in [place], this is where his daughter goes for her pelvic floor pain.  He is urinating regularly.  He sometimes has bowel incontinence but he does have sensation prior to needing to go.  He uses a TENS unit for his chronic back pain.  Occupation: Retired, Marital status: Married, Substance use: Occasional marijuana, daily alcohol (cites up to 2 shots per day) Diet: fair, Exercise: no structured Last colonoscopy: does FOBT. Has not done in a while. Refills needed today: none Immunizations needed: none  Past Medical History:  Diagnosis Date   Acute meniscal tear of left knee    Allergic rhinitis    BPH (benign prostatic hypertrophy)    Cervical spondylosis without myelopathy    Cervicogenic headache    Chronic neck pain    uses TENS unit  prn   Diverticulitis    GERD (gastroesophageal reflux disease)    H/O hiatal hernia    History of diverticulitis    History of kidney stones    History of TIA (transient ischemic attack)    04/ 2011---  no residuals   Hypertension    Hypothyroidism    Peripheral neuropathy    Shingles    Syrinx of spinal cord (HCC)    C4 -- C7   Social History   Socioeconomic History   Marital status: Married    Spouse name: Not on file   Number of children: 2   Years of education: 12   Highest education level: Not on file  Occupational History   Occupation: Retired    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 strain: Not hard at all   Food insecurity    Worry:  Never true    Inability: Never true   Transportation needs    Medical: No    Non-medical: No  Tobacco Use   Smoking status: Never Smoker   Smokeless tobacco: Never Used  Substance and Sexual Activity   Alcohol use: Yes    Alcohol/week: 7.0 standard drinks    Types: 7 Shots of liquor per week   Drug use: No   Sexual activity: Not on file  Lifestyle   Physical activity    Days per week: 6 days    Minutes per session: 30 min   Stress: Only a little  Relationships   Social connections    Talks on phone: More than three times a week    Gets together: More than three times a week    Attends religious service: Never    Active member of club or organization: No    Attends meetings of clubs or organizations: Never    Relationship status: Married   Intimate partner violence    Fear of current or ex partner: No    Emotionally abused: No    Physically abused: No    Forced sexual activity: No  Other Topics Concern   Not on file  Social History Narrative   Lives at home with wife.       Patient is  right handed.   Patient drinks 1-2 cups of caffeine daily.   Past Surgical History:  Procedure Laterality Date   CARDIAC CATHETERIZATION  02-18-2010   dr Angelena Form   normal coronary arteries/  normal lvsf--  ef 65%   COLONOSCOPY     [place]   HERNIA REPAIR     [facility] [place] [facility]   KNEE ARTHROSCOPY Left 03/13/2014   Procedure: LEFT ARTHROSCOPY KNEE WITH DEBRIDMENT;  Surgeon: Gearlean Alf, MD;  Location[facility];  Service: Orthopedics;  Laterality: Left;   LAPAROSCOPIC CHOLECYSTECTOMY  01-30-2003   NEGATIVE SLEEP STUDY  2013   per pt   RADIOFREQUENCY ABLATION NERVES  05/2016   REMOVAL FORGEIN BODY FROM EAR  AGE 50   RIGHT URETEROSCOPIC LASER LITHOTRIPSY STONE EXTRACTION /  STENT PLACEMENT  05-10-2000   TRANSTHORACIC ECHOCARDIOGRAM  01-16-2010   normal lvf/  ef  60%/  mild lae   Family History  Problem Relation Age of  Onset   Prostate cancer Father    Heart disease Father    Stroke Father    Nephrolithiasis Sister    Cancer Sister        Intestinal   Hyperlipidemia Sister    Hyperlipidemia Sister    Cancer Sister        Sinus   COPD Other    Colon polyps Brother        x2   Colon polyps Sister    Colon cancer Neg Hx    Esophageal cancer Neg Hx     Current Outpatient Medications:    Alpha-Lipoic Acid 600 MG CAPS, Take by mouth., Disp: , Rfl:    amLODipine (NORVASC) 10 MG tablet, TAKE 1 TABLET BY MOUTH  DAILY, Disp: 90 tablet, Rfl: 1   aspirin 81 MG tablet, Take 81 mg by mouth at bedtime. , Disp: , Rfl:    azelastine (ASTELIN) 0.1 % nasal spray, PLACE 2 SPRAYS INTO BOTH NOSTRILS 2 (TWO) TIMES DAILY., Disp: 30 mL, Rfl: 5   baclofen (LIORESAL) 10 MG tablet, Take 1 tablet (10 mg total) by mouth 2 (two) times daily., Disp: 180 each, Rfl: 1   Coenzyme Q10 (CO Q 10) 100 MG CAPS, Take 1 capsule by mouth daily., Disp: , Rfl:    levothyroxine (SYNTHROID) 150 MCG tablet, Take 1 tablet (150 mcg total) by mouth daily before breakfast., Disp: 90 tablet, Rfl: 0   LORazepam (ATIVAN) 0.5 MG tablet, Take 1 tablet (0.5 mg total) by mouth 2 (two) times daily as needed for anxiety., Disp: 30 tablet, Rfl: 0   losartan (COZAAR) 50 MG tablet, TAKE 1 TABLET BY MOUTH  DAILY, Disp: 90 tablet, Rfl: 1   NON FORMULARY, Take 3 capsules by mouth every morning. Nugenix Testosterone Booster / [place] , Disp: , Rfl:    omeprazole (PRILOSEC) 20 MG capsule, Take 1 capsule (20 mg total) by mouth 2 (two) times daily., Disp: 180 capsule, Rfl: 1   UNABLE TO FIND, Med Name: CBD oil, Disp: , Rfl:    venlafaxine XR (EFFEXOR-XR) 75 MG 24 hr capsule, TAKE 1 CAPSULE BY MOUTH  DAILY WITH BREAKFAST, Disp: 90 capsule, Rfl: 0  Allergies  Allergen Reactions   Cymbalta [Duloxetine Hcl] Other (See Comments)    Depression   Gabapentin Other (See Comments)    nightmares   Lyrica [Pregabalin] Other (See Comments)     Paranoid   Robaxin [Methocarbamol] Other (See Comments)    Seeing "bright lights"   Sudafed [Pseudoephedrine Hcl] Other (  See Comments)    Increased heart rate   Epinephrine Palpitations     ROS: Review of Systems Constitutional: negative Eyes: negative Ears, nose, mouth, throat, and face: negative Respiratory: negative Cardiovascular: negative Gastrointestinal: negative Genitourinary:positive for pelvic pain Integument/breast: negative Hematologic/lymphatic: negative Musculoskeletal:positive for back pain Neurological: negative Behavioral/Psych: negative Endocrine: negative Allergic/Immunologic: negative    Physical exam BP 140/83    Pulse 76    Temp (!) 97.4 F (36.3 C) (Oral)    Ht '6\' 1"'  (1.854 m)    Wt 207 lb 12.8 oz (94.3 kg)    BMI 27.42 kg/m  General appearance: alert, cooperative, appears stated age and no distress Head: Normocephalic, without obvious abnormality, atraumatic Eyes: negative findings: lids and lashes normal, conjunctivae and sclerae normal, corneas clear and pupils equal, round, reactive to light and accomodation Ears: normal TM's and external ear canals both ears Nose: Nares normal. Septum midline. Mucosa normal. No drainage or sinus tenderness. Throat: lips, mucosa, and tongue normal; teeth and gums normal Neck: no adenopathy, no carotid bruit, no JVD, supple, symmetrical, trachea midline and thyroid not enlarged, symmetric, no tenderness/mass/nodules Back: symmetric, no curvature. ROM normal. No CVA tenderness. Lungs: clear to auscultation bilaterally Chest wall: no tenderness Heart: regular rate and rhythm, S1, S2 normal, no murmur, click, rub or gallop Abdomen: soft, non-tender; bowel sounds normal; no masses,  no organomegaly and protuberant Extremities: extremities normal, atraumatic, no cyanosis or edema Pulses: 2+ and symmetric Skin: vitilligo of left wrist noted Lymph nodes: Cervical, supraclavicular, and axillary nodes  normal. Neurologic: patellar DTRs 1/4 bilaterally. Follows commands  Psych: Mood stable, speech normal, affect appropriate. Depression screen [facility] 2/9 03/28/2019 02/06/2019 12/05/2018  Decreased Interest 0 0 1  Down, Depressed, Hopeless 0 0 0  PHQ - 2 Score 0 0 1  Altered sleeping - - -  Tired, decreased energy - - -  Change in appetite - - -  Feeling bad or failure about yourself  - - -  Trouble concentrating - - -  Moving slowly or fidgety/restless - - -  Suicidal thoughts - - -  PHQ-9 Score - - -  Difficult doing work/chores - - -   Assessment/ Plan: [name] here for annual physical exam.   1. Annual physical exam Counseled on marijuana and alcohol use.  2. Essential hypertension Blood pressure stable - CMP14+EGFR - Lipid Panel  3. Gastroesophageal reflux disease without esophagitis Continue PPI - omeprazole (PRILOSEC) 20 MG capsule; Take 1 capsule (20 mg total) by mouth 2 (two) times daily.  Dispense: 180 capsule; Refill: 1  4. Hypothyroidism due to acquired atrophy of thyroid Check thyroid panel.  Continue Synthroid - levothyroxine (SYNTHROID) 150 MCG tablet; Take 1 tablet (150 mcg total) by mouth daily before breakfast.  Dispense: 90 tablet; Refill: 1 - Thyroid Panel With TSH - Lipid Panel  5. Syringomyelia and syringobulbia (HCC) Not using Diamox.  Followed by neurology intervally  6. Benign prostatic hyperplasia without lower urinary tract symptoms Asymptomatic - PSA  7. Anxiety Controlled  8. Pelvic pain Refer to physical therapy. - Ambulatory referral to Physical Therapy  9. Screening, anemia, deficiency, iron - CBC   Counseled on healthy lifestyle choices, including diet (rich in fruits, vegetables and lean meats and low in salt and simple carbohydrates) and exercise (at least 30 minutes of moderate physical activity daily).  Patient to follow up in 1 year for annual exam or sooner if needed.  Kregg Cihlar M. Lajuana Ripple, DO

## 2019-03-28 NOTE — Patient Instructions (Signed)
You had labs performed today.  You will be contacted with the results of the labs once they are available, usually in the next 3 business days for routine lab work.  If you have an active my chart account, they will be released to your MyChart.  If you prefer to have these labs released to you via telephone, please let us know.  If you had a pap smear or biopsy performed, expect to be contacted in about 7-10 days.   Health Maintenance, Male Adopting a healthy lifestyle and getting preventive care are important in promoting health and wellness. Ask your health care provider about:  The right schedule for you to have regular tests and exams.  Things you can do on your own to prevent diseases and keep yourself healthy. What should I know about diet, weight, and exercise? Eat a healthy diet   Eat a diet that includes plenty of vegetables, fruits, low-fat dairy products, and lean protein.  Do not eat a lot of foods that are high in solid fats, added sugars, or sodium. Maintain a healthy weight Body mass index (BMI) is a measurement that can be used to identify possible weight problems. It estimates body fat based on height and weight. Your health care provider can help determine your BMI and help you achieve or maintain a healthy weight. Get regular exercise Get regular exercise. This is one of the most important things you can do for your health. Most adults should:  Exercise for at least 150 minutes each week. The exercise should increase your heart rate and make you sweat (moderate-intensity exercise).  Do strengthening exercises at least twice a week. This is in addition to the moderate-intensity exercise.  Spend less time sitting. Even light physical activity can be beneficial. Watch cholesterol and blood lipids Have your blood tested for lipids and cholesterol at 68 years of age, then have this test every 5 years. You may need to have your cholesterol levels checked more often if:   Your lipid or cholesterol levels are high.  You are older than 68 years of age.  You are at high risk for heart disease. What should I know about cancer screening? Many types of cancers can be detected early and may often be prevented. Depending on your health history and family history, you may need to have cancer screening at various ages. This may include screening for:  Colorectal cancer.  Prostate cancer.  Skin cancer.  Lung cancer. What should I know about heart disease, diabetes, and high blood pressure? Blood pressure and heart disease  High blood pressure causes heart disease and increases the risk of stroke. This is more likely to develop in people who have high blood pressure readings, are of African descent, or are overweight.  Talk with your health care provider about your target blood pressure readings.  Have your blood pressure checked: ? Every 3-5 years if you are 68-12 years of age. ? Every year if you are 68 years old or older.  If you are between the ages of 32 and 30 and are a current or former smoker, ask your health care provider if you should have a one-time screening for abdominal aortic aneurysm (AAA). Diabetes Have regular diabetes screenings. This checks your fasting blood sugar level. Have the screening done:  Once every three years after age 68 if you are at a normal weight and have a low risk for diabetes.  More often and at a younger age if you are overweight  or have a high risk for diabetes. What should I know about preventing infection? Hepatitis B If you have a higher risk for hepatitis B, you should be screened for this virus. Talk with your health care provider to find out if you are at risk for hepatitis B infection. Hepatitis C Blood testing is recommended for:  Everyone born from [birth year] through 1965.  Anyone with known risk factors for hepatitis C. Sexually transmitted infections (STIs)  You should be screened each year for STIs,  including gonorrhea and chlamydia, if: ? You are sexually active and are younger than 68 years of age. ? You are older than 68 years of age and your health care provider tells you that you are at risk for this type of infection. ? Your sexual activity has changed since you were last screened, and you are at increased risk for chlamydia or gonorrhea. Ask your health care provider if you are at risk.  Ask your health care provider about whether you are at high risk for HIV. Your health care provider may recommend a prescription medicine to help prevent HIV infection. If you choose to take medicine to prevent HIV, you should first get tested for HIV. You should then be tested every 3 months for as long as you are taking the medicine. Follow these instructions at home: Lifestyle  Do not use any products that contain nicotine or tobacco, such as cigarettes, e-cigarettes, and chewing tobacco. If you need help quitting, ask your health care provider.  Do not use street drugs.  Do not share needles.  Ask your health care provider for help if you need support or information about quitting drugs. Alcohol use  Do not drink alcohol if your health care provider tells you not to drink.  If you drink alcohol: ? Limit how much you have to 0-2 drinks a day. ? Be aware of how much alcohol is in your drink. In the [place] one drink equals one 12 oz bottle of beer (355 mL), one 5 oz glass of wine (148 mL), or one 1 oz glass of hard liquor (44 mL). General instructions  Schedule regular health, dental, and eye exams.  Stay current with your vaccines.  Tell your health care provider if: ? You often feel depressed. ? You have ever been abused or do not feel safe at home. Summary  Adopting a healthy lifestyle and getting preventive care are important in promoting health and wellness.  Follow your health care provider's instructions about healthy diet, exercising, and getting tested or screened for  diseases.  Follow your health care provider's instructions on monitoring your cholesterol and blood pressure. This information is not intended to replace advice given to you by your health care provider. Make sure you discuss any questions you have with your health care provider. Document Released: 03/11/2008 Document Revised: 09/06/2018 Document Reviewed: 09/06/2018 Elsevier Patient Education  2020 [employer].

## 2019-03-29 ENCOUNTER — Encounter: Payer: Self-pay | Admitting: Physical Therapy

## 2019-03-29 ENCOUNTER — Other Ambulatory Visit: Payer: Self-pay | Admitting: Family Medicine

## 2019-03-29 ENCOUNTER — Ambulatory Visit: Payer: Medicare Other | Attending: Family Medicine | Admitting: Physical Therapy

## 2019-03-29 ENCOUNTER — Other Ambulatory Visit: Payer: Self-pay

## 2019-03-29 DIAGNOSIS — M6281 Muscle weakness (generalized): Secondary | ICD-10-CM

## 2019-03-29 DIAGNOSIS — M546 Pain in thoracic spine: Secondary | ICD-10-CM | POA: Diagnosis not present

## 2019-03-29 DIAGNOSIS — M5441 Lumbago with sciatica, right side: Secondary | ICD-10-CM | POA: Insufficient documentation

## 2019-03-29 DIAGNOSIS — R278 Other lack of coordination: Secondary | ICD-10-CM | POA: Diagnosis not present

## 2019-03-29 DIAGNOSIS — G8929 Other chronic pain: Secondary | ICD-10-CM | POA: Diagnosis not present

## 2019-03-29 DIAGNOSIS — R252 Cramp and spasm: Secondary | ICD-10-CM | POA: Diagnosis not present

## 2019-03-29 LAB — LIPID PANEL
Chol/HDL Ratio: 4.9 ratio (ref 0.0–5.0)
Cholesterol, Total: 186 mg/dL (ref 100–199)
HDL: 38 mg/dL — ABNORMAL LOW (ref >39)
LDL Calculated: 74 mg/dL (ref 0–99)
Triglycerides: 371 mg/dL — ABNORMAL HIGH (ref 0–149)
VLDL Cholesterol Cal: 74 mg/dL — ABNORMAL HIGH (ref 5–40)

## 2019-03-29 LAB — CBC
Hematocrit: 50.2 % (ref 37.5–51.0)
Hemoglobin: 17.2 g/dL (ref 13.0–17.7)
MCH: 34.3 pg — ABNORMAL HIGH (ref 26.6–33.0)
MCHC: 34.3 g/dL (ref 31.5–35.7)
MCV: 100 fL — ABNORMAL HIGH (ref 79–97)
Platelets: 227 10*3/uL (ref 150–450)
RBC: 5.01 x10E6/uL (ref 4.14–5.80)
RDW: 13.1 % (ref 11.6–15.4)
WBC: 6.1 10*3/uL (ref 3.4–10.8)

## 2019-03-29 LAB — CMP14+EGFR
ALT: 32 IU/L (ref 0–44)
AST: 20 IU/L (ref 0–40)
Albumin/Globulin Ratio: 2 (ref 1.2–2.2)
Albumin: 4.5 g/dL (ref 3.8–4.8)
Alkaline Phosphatase: 112 IU/L (ref 39–117)
BUN/Creatinine Ratio: 9 — ABNORMAL LOW (ref 10–24)
BUN: 10 mg/dL (ref 8–27)
Bilirubin Total: 1 mg/dL (ref 0.0–1.2)
CO2: 20 mmol/L (ref 20–29)
Calcium: 9.5 mg/dL (ref 8.6–10.2)
Chloride: 104 mmol/L (ref 96–106)
Creatinine, Ser: 1.16 mg/dL (ref 0.76–1.27)
GFR calc Af Amer: 75 mL/min/{1.73_m2} (ref >59)
GFR calc non Af Amer: 65 mL/min/{1.73_m2} (ref >59)
Globulin, Total: 2.3 g/dL (ref 1.5–4.5)
Glucose: 113 mg/dL — ABNORMAL HIGH (ref 65–99)
Potassium: 3.9 mmol/L (ref 3.5–5.2)
Sodium: 140 mmol/L (ref 134–144)
Total Protein: 6.8 g/dL (ref 6.0–8.5)

## 2019-03-29 LAB — THYROID PANEL WITH TSH
Free Thyroxine Index: 2.4 (ref 1.2–4.9)
T3 Uptake Ratio: 25 % (ref 24–39)
T4, Total: 9.5 ug/dL (ref 4.5–12.0)
TSH: 0.642 u[IU]/mL (ref 0.450–4.500)

## 2019-03-29 LAB — PSA: Prostate Specific Ag, Serum: 0.9 ng/mL (ref 0.0–4.0)

## 2019-03-29 MED ORDER — ATORVASTATIN CALCIUM 20 MG PO TABS: 20.0000 mg | ORAL_TABLET | Freq: Every day | ORAL | 3 refills | Status: DC

## 2019-03-29 NOTE — Patient Instructions (Addendum)
Sitting    Sit comfortably. Allow body's muscles to relax. Place hands on belly. Inhale slowly and deeply for _3_ seconds, so hands move out. Then take __3 seconds to exhale. Repeat _5__ times. Do __2_ times a day.  Copyright  VHI. All rights reserved.  Guided Meditation for Pelvic Floor Relaxation  [employer]  [facility] [address], [place], [place] [address] Phone # [phone] Fax [phone]

## 2019-03-29 NOTE — Therapy (Signed)
[facility] [address], [place], [place], [address] Phone: [phone]   Fax:  [phone]  Physical Therapy Evaluation  Patient Details  Name: Jacob Kerr MRN: [id number] Date of Birth: Mar 10, 1951 Referring Provider (PT): Dr. Ronnie Doss   Encounter Date: 03/29/2019  PT End of Session - 03/29/19 0855    Visit Number  1    Date for PT Re-Evaluation  05/24/19    Authorization Type  UHC Medicare    PT Start Time  0800    PT Stop Time  0850    PT Time Calculation (min)  50 min    Activity Tolerance  Patient tolerated treatment well;No increased pain    Behavior During Therapy  WFL for tasks assessed/performed       Past Medical History:  Diagnosis Date  . Acute meniscal tear of left knee   . Allergic rhinitis   . BPH (benign prostatic hypertrophy)   . Cervical spondylosis without myelopathy   . Cervicogenic headache   . Chronic neck pain    uses TENS unit  prn  . Diverticulitis   . GERD (gastroesophageal reflux disease)   . H/O hiatal hernia   . History of diverticulitis   . History of kidney stones   . History of TIA (transient ischemic attack)    04/ 2011---  no residuals  . Hypertension   . Hypothyroidism   . Peripheral neuropathy   . Shingles   . Syrinx of spinal cord ([place])    C4 -- C7    Past Surgical History:  Procedure Laterality Date  . CARDIAC CATHETERIZATION  02-18-2010   dr Angelena Form   normal coronary arteries/  normal lvsf--  ef 65%  . COLONOSCOPY    . [place]  . HERNIA REPAIR     [place] [facility]  . KNEE ARTHROSCOPY Left 03/13/2014   Procedure: LEFT ARTHROSCOPY KNEE WITH DEBRIDMENT;  Surgeon: Gearlean Alf, MD;  Location: [facility];  Service: Orthopedics;  Laterality: Left;  . LAPAROSCOPIC CHOLECYSTECTOMY  01-30-2003  . NEGATIVE SLEEP STUDY  2013   per pt  . RADIOFREQUENCY ABLATION NERVES  05/2016  . REMOVAL FORGEIN BODY FROM EAR   AGE 32  . RIGHT URETEROSCOPIC LASER LITHOTRIPSY STONE EXTRACTION /  STENT PLACEMENT  05-10-2000  . TRANSTHORACIC ECHOCARDIOGRAM  01-16-2010   normal lvf/  ef  60%/  mild lae    There were no vitals filed for this visit.   Subjective Assessment - 03/29/19 0802    Subjective  Fell in 2015 and started to go into pelvic area. Patient was feeling vibration in the pelvic area. When I fell I twisted my body and fell on left side. Pain is mostly on right and recently went to the left side. Trouble initiating urine stream. Patient has to strain to have a bowel movement. Has had fecal incontinence 4 times in the past 6 months. Trouble with erections and painful in anus.    Pertinent History  Peripheral neuropathy; headaches; left knee scope; neck pain; TIA.    Patient Stated Goals  reduce pelvic pain, go hunting or hiking without worrying about incontinence, keep up with 51 year old grandson    Currently in Pain?  Yes    Pain Score  8     Pain Location  Back    Pain Orientation  Right;Lower    Pain Descriptors / Indicators  Sharp;Shooting;Aching;Discomfort;Cramping    Pain Type  Chronic pain  Pain Onset  More than a month ago    Pain Frequency  Constant    Aggravating Factors   being upright,    Pain Relieving Factors  hot water, heating pad, Tens unit    Multiple Pain Sites  Yes    Pain Score  5    Pain Location  Rectum    Pain Orientation  Mid   pain behind penis   Pain Descriptors / Indicators  Other (Comment);Burning   vibration   Pain Type  Chronic pain    Pain Onset  More than a month ago    Pain Frequency  Intermittent    Aggravating Factors   after intercourse, sitting    Pain Relieving Factors  being completely still, hot water         [facility] PT Assessment - 03/29/19 0001      Assessment   Medical Diagnosis  R10.2 Pelvic Pain    Referring Provider (PT)  Dr. Ronnie Doss    Onset Date/Surgical Date  --   2015   Prior Therapy  for back      Precautions   Precautions   None      Restrictions   Weight Bearing Restrictions  No      Balance Screen   Has the patient fallen in the past 6 months  No    Has the patient had a decrease in activity level because of a fear of falling?   No    Is the patient reluctant to leave their home because of a fear of falling?   No      Home Film/video editor residence      Prior Function   Level of Independence  Independent    Leisure  stretches, walking, up and down tractor      Cognition   Overall Cognitive Status  Within Functional Limits for tasks assessed      Posture/Postural Control   Posture/Postural Control  Postural limitations    Postural Limitations  Rounded Shoulders;Forward head    Posture Comments  constant motion in sitting      ROM / Strength   AROM / PROM / Strength  AROM;PROM;Strength      AROM   AROM Assessment Site  Lumbar    Lumbar Flexion  decreased by 50% with increased muslce mass on left    Lumbar - Right Side Bend  decreased by 50%    Lumbar - Left Side Bend  decreased by 25%    Lumbar - Right Rotation  decreased by 25%    Lumbar - Left Rotation  decreased by 25%      Strength   Right Hip ABduction  4/5    Left Hip Flexion  4/5    Left Hip ABduction  4/5      Palpation   Spinal mobility  T5-T10 and L1-L5 decreased mobility    SI assessment   right ilium anteriorly rotated, sacrum rotated left    Palpation comment  tenderness left suprapubic area and felt like he will pee,                 Objective measurements completed on examination: See above findings.    Pelvic Floor Special Questions - 03/29/19 0001    Currently Sexually Active  Yes    Marinoff Scale  discomfort that does not affect completion   after erection   Urinary Leakage  No    Fecal incontinence  Yes  Skin Integrity  Intact    Pelvic Floor Internal Exam  Patient confirms identification and approves PT to assess muscles and treatment    Exam Type  Rectal    Palpation   tender in the obturator internist and levator ani    Strength  weak squeeze, no lift   most of squeeze is in the puborectalis,      [facility] Adult PT Treatment/Exercise - 03/29/19 0001      Self-Care   Self-Care  Other Self-Care Comments    Other Self-Care Comments   pelvic floor meditaiton and instruction on diaphragmatic breathing             PT Education - 03/29/19 0855    Education Details  diaphragmatic breathing and pelvic floor meditation    Person(s) Educated  Patient    Methods  Explanation;Handout    Comprehension  Verbalized understanding       PT Short Term Goals - 03/29/19 0915      PT SHORT TERM GOAL #1   Title  independent with initial HEP    Time  4    Period  Weeks    Status  New    Target Date  04/26/19      PT SHORT TERM GOAL #2   Title  understand how to toilet correctly for bowel movement and urinating with relaxation of the pelvic floor    Time  4    Period  Weeks    Status  New    Target Date  04/26/19      PT SHORT TERM GOAL #3   Title  sittng with pain decreased >/= 25%    Time  4    Period  Weeks    Status  New    Target Date  04/26/19      PT SHORT TERM GOAL #4   Title  pain after ejaculation decreased >/= 25%    Time  4    Period  Weeks    Status  New    Target Date  04/26/19        PT Long Term Goals - 03/29/19 0957      PT LONG TERM GOAL #1   Title  Independent with a HEP.    Time  8    Period  Weeks    Status  New    Target Date  05/24/19      PT LONG TERM GOAL #2   Title  able to sit with pain level improved >/= 75% due to ability to relax the pelvic floor    Time  8    Period  Weeks    Status  New    Target Date  05/24/19      PT LONG TERM GOAL #3   Title  pain after ejaculation decreased >/= 75%    Time  8    Period  Weeks    Status  New    Target Date  05/24/19      PT LONG TERM GOAL #4   Title  able to initiate a urine stream and bowel movement without difficulty    Time  8    Period  Weeks     Status  New    Target Date  05/24/19      PT LONG TERM GOAL #5   Title  able to play with his 69 year old grandson with minimal to no pain due to improve mobilty and decreased in muscle spasms  Time  8    Period  Weeks    Status  New    Target Date  05/24/19             Plan - 03/29/19 0856    Clinical Impression Statement  Patient is a 68 year old male with chronic pelvic and back pain since he fell in 2015. Patient reports pain level varies 5-8/10. Patient pain is worse with sitting, after intercourse, and movement. Patient reports he has had fecal incontinence 4 times in 6 months and difficulty holding it back. Patient has difficulty with initiating a urine stream. Patient has to strain to have a bowel movement due to feeling like he is hitting a wall. Pelvic floor strength is 2/5 with majority of the contraction in the puborectalis and weakness in the IAS and EAS. Tenderness located in the obturator internist, levator ani, perineal body, ishiocavernosus, and bulbocavernosus. Patient has decreased lumbar movement by 50%. Right ilium is rotated anteriorly and sacrum rotated to the left. Patient has weakness in his hips. Patient would benefit from skilled therapy to reduce pain, understand how to manage pain, improve muscle coordination and improve function.    Personal Factors and Comorbidities  Age;Past/Current Experience;Comorbidity 1;Sex;Comorbidity 2;Comorbidity 3+;Time since onset of injury/illness/exacerbation    Comorbidities  peripheral neuropath, headaches; left knee scope, neck pain, TIA    Examination-Activity Limitations  Sit;Continence    Examination-Participation Restrictions  Community Activity    Stability/Clinical Decision Making  Evolving/Moderate complexity    Clinical Decision Making  Moderate    Rehab Potential  Excellent    PT Frequency  1x / week    PT Duration  8 weeks    PT Treatment/Interventions  ADLs/Self Care Home  Management;Biofeedback;Cryotherapy;Electrical Stimulation;Moist Heat;Therapeutic exercise;Therapeutic activities;Neuromuscular re-education;Patient/family education;Dry needling;Passive range of motion;Manual techniques;Spinal Manipulations    PT Next Visit Plan  correct pelvis, soft tissue work to pelvis, toileting technique, abdominal massage, pelvic floor stretches, dry needling,spinal mobilization    Consulted and Agree with Plan of Care  Patient       Patient will benefit from skilled therapeutic intervention in order to improve the following deficits and impairments:  Decreased coordination, Increased fascial restricitons, Decreased endurance, Increased muscle spasms, Decreased activity tolerance, Pain, Hypomobility, Decreased mobility, Decreased strength  Visit Diagnosis: 1. Muscle weakness (generalized)   2. Cramp and spasm   3. Chronic bilateral low back pain with right-sided sciatica   4. Other lack of coordination        Problem List Patient Active Problem List   Diagnosis Date Noted  . Anxiety 10/04/2017  . Nonallopathic lesion of rib cage 06/15/2017  . Nonallopathic lesion of cervical region 05/18/2017  . Nonallopathic lesion of thoracic region 05/18/2017  . Nonallopathic lesion of lumbosacral region 05/18/2017  . Arrhythmia 10/08/2014  . TIA (transient ischemic attack) 08/09/2014  . Lateral meniscal tear 03/12/2014  . Testosterone deficiency 07/04/2013  . Cervicogenic headache 03/28/2013  . Vitamin D deficiency 01/01/2013  . Hyperlipidemia 01/01/2013  . BPH (benign prostatic hyperplasia) 11/03/2012  . Diverticulosis large intestine w/o perforation or abscess w/bleeding 11/03/2012  . Peripheral neuropathy 11/03/2012  . Hypothyroidism 01/18/2011  . Essential hypertension 01/18/2011  . GERD (gastroesophageal reflux disease) 01/18/2011  . Cervical spondylosis without myelopathy 11/24/2010  . Headache(784.0) 11/24/2010  . Sleep disturbance, unspecified 11/24/2010  .  Syringomyelia and syringobulbia ([place]) 11/24/2010  . Disturbance of skin sensation 11/24/2010  . Cerebrovascular disease, unspecified 11/24/2010    Earlie Counts, PT 03/29/19 10:02 AM   [facility] [address] [place], [place], [address] Phone: [phone]   Fax:  [phone]  Name: Jacob Kerr MRN: [id number] Date of Birth: 1951/08/21

## 2019-04-01 ENCOUNTER — Other Ambulatory Visit: Payer: Self-pay | Admitting: Family Medicine

## 2019-04-03 ENCOUNTER — Other Ambulatory Visit: Payer: Self-pay | Admitting: *Deleted

## 2019-04-03 MED ORDER — BACLOFEN 10 MG PO TABS: 10.0000 mg | ORAL_TABLET | Freq: Two times a day (BID) | ORAL | 1 refills | Status: DC

## 2019-04-11 ENCOUNTER — Encounter: Payer: Self-pay | Admitting: Physical Therapy

## 2019-04-11 ENCOUNTER — Other Ambulatory Visit: Payer: Self-pay

## 2019-04-11 ENCOUNTER — Ambulatory Visit: Payer: Medicare Other | Admitting: Physical Therapy

## 2019-04-11 DIAGNOSIS — R252 Cramp and spasm: Secondary | ICD-10-CM

## 2019-04-11 DIAGNOSIS — M5441 Lumbago with sciatica, right side: Secondary | ICD-10-CM | POA: Diagnosis not present

## 2019-04-11 DIAGNOSIS — M6281 Muscle weakness (generalized): Secondary | ICD-10-CM | POA: Diagnosis not present

## 2019-04-11 DIAGNOSIS — R278 Other lack of coordination: Secondary | ICD-10-CM

## 2019-04-11 DIAGNOSIS — M546 Pain in thoracic spine: Secondary | ICD-10-CM

## 2019-04-11 DIAGNOSIS — G8929 Other chronic pain: Secondary | ICD-10-CM | POA: Diagnosis not present

## 2019-04-11 NOTE — Therapy (Signed)
[facility] [address] [place], [place], [address] Phone: [phone]   Fax:  [phone]  Physical Therapy Treatment  Patient Details  Name: Jacob Kerr MRN: [id number] Date of Birth: 11-17-1950 Referring Provider (PT): Dr. Ronnie Doss   Encounter Date: 04/11/2019  PT End of Session - 04/11/19 0855    Visit Number  2    Date for PT Re-Evaluation  05/24/19    Authorization Type  [employer] Medicare    PT Start Time  0800    PT Stop Time  0840    PT Time Calculation (min)  40 min    Activity Tolerance  Patient tolerated treatment well;No increased pain    Behavior During Therapy  WFL for tasks assessed/performed       Past Medical History:  Diagnosis Date  . Acute meniscal tear of left knee   . Allergic rhinitis   . BPH (benign prostatic hypertrophy)   . Cervical spondylosis without myelopathy   . Cervicogenic headache   . Chronic neck pain    uses TENS unit  prn  . Diverticulitis   . GERD (gastroesophageal reflux disease)   . H/O hiatal hernia   . History of diverticulitis   . History of kidney stones   . History of TIA (transient ischemic attack)    04/ 2011---  no residuals  . Hypertension   . Hypothyroidism   . Peripheral neuropathy   . Shingles   . Syrinx of spinal cord ([place])    C4 -- C7    Past Surgical History:  Procedure Laterality Date  . CARDIAC CATHETERIZATION  02-18-2010   dr Angelena Form   normal coronary arteries/  normal lvsf--  ef 65%  . COLONOSCOPY    . [place]  . HERNIA REPAIR     [facility] [place] [facility]  . KNEE ARTHROSCOPY Left 03/13/2014   Procedure: LEFT ARTHROSCOPY KNEE WITH DEBRIDMENT;  Surgeon: Gearlean Alf, MD;  Location: [facility];  Service: Orthopedics;  Laterality: Left;  . LAPAROSCOPIC CHOLECYSTECTOMY  01-30-2003  . NEGATIVE SLEEP STUDY  2013   per pt  . RADIOFREQUENCY ABLATION NERVES  05/2016  . REMOVAL FORGEIN BODY FROM EAR   AGE 68  . RIGHT URETEROSCOPIC LASER LITHOTRIPSY STONE EXTRACTION /  STENT PLACEMENT  05-10-2000  . TRANSTHORACIC ECHOCARDIOGRAM  01-16-2010   normal lvf/  ef  60%/  mild lae    There were no vitals filed for this visit.  Subjective Assessment - 04/11/19 0800    Subjective  I am doing well. I felt better after the initial evaluation for several hours.    Pertinent History  Peripheral neuropathy; headaches; left knee scope; neck pain; TIA.    Patient Stated Goals  reduce pelvic pain, go hunting or hiking without worrying about incontinence, keep up with 39 year old grandson    Currently in Pain?  Yes    Pain Score  7     Pain Location  Back    Pain Orientation  Right;Lower    Pain Descriptors / Indicators  Cramping;Aching;Discomfort;Sharp;Shooting    Pain Type  Chronic pain    Pain Onset  More than a month ago    Pain Frequency  Constant    Aggravating Factors   being upright    Pain Relieving Factors  hot water, heating pad, TENS unit    Multiple Pain Sites  Yes    Pain Score  4    Pain Location  Rectum    Pain Orientation  Mid    Pain Descriptors / Indicators  Other (Comment)   vibration   Pain Type  Chronic pain    Pain Onset  More than a month ago    Pain Frequency  Intermittent    Aggravating Factors   after intercourse, sitting    Pain Relieving Factors  being completely still, hot water                       [facility] Adult PT Treatment/Exercise - 04/11/19 0001      Self-Care   Self-Care  Other Self-Care Comments    Other Self-Care Comments   instruction on abdominal massage to promote perstalic motion of intestines      Lumbar Exercises: Stretches   Active Hamstring Stretch  Right;Left;1 rep;30 seconds   supine   Lower Trunk Rotation  2 reps;30 seconds   supine both sides   Piriformis Stretch  Right;Left;1 rep;30 seconds   supine   Other Lumbar Stretch Exercise  cat camel 10x      Manual Therapy   Manual Therapy  Joint mobilization;Soft tissue  mobilization;Myofascial release    Joint Mobilization  right posterior mid to lower rib cage to open up and work on expanding the rib cage    Soft tissue mobilization  right diaphgram, right quadratus, right obliques, along right T10-T12 paraspinals; circular massage around the abdomen to promote mobility of the large intestines    Myofascial Release  around umbilicus, release of the mesenteric root mobility, releease of the small intestines off the bladder             PT Education - 04/11/19 0847    Education Details  Access Code: [email or web address]; abdominal massage    Person(s) Educated  Patient    Methods  Explanation;Demonstration;Handout    Comprehension  Verbalized understanding;Returned demonstration       PT Short Term Goals - 03/29/19 0915      PT SHORT TERM GOAL #1   Title  independent with initial HEP    Time  4    Period  Weeks    Status  New    Target Date  04/26/19      PT SHORT TERM GOAL #2   Title  understand how to toilet correctly for bowel movement and urinating with relaxation of the pelvic floor    Time  4    Period  Weeks    Status  New    Target Date  04/26/19      PT SHORT TERM GOAL #3   Title  sittng with pain decreased >/= 25%    Time  4    Period  Weeks    Status  New    Target Date  04/26/19      PT SHORT TERM GOAL #4   Title  pain after ejaculation decreased >/= 25%    Time  4    Period  Weeks    Status  New    Target Date  04/26/19        PT Long Term Goals - 03/29/19 0957      PT LONG TERM GOAL #1   Title  Independent with a HEP.    Time  8    Period  Weeks    Status  New    Target Date  05/24/19      PT LONG TERM GOAL #2   Title  able to sit with  pain level improved >/= 75% due to ability to relax the pelvic floor    Time  8    Period  Weeks    Status  New    Target Date  05/24/19      PT LONG TERM GOAL #3   Title  pain after ejaculation decreased >/= 75%    Time  8    Period  Weeks    Status  New    Target Date   05/24/19      PT LONG TERM GOAL #4   Title  able to initiate a urine stream and bowel movement without difficulty    Time  8    Period  Weeks    Status  New    Target Date  05/24/19      PT LONG TERM GOAL #5   Title  able to play with his 46 year old grandson with minimal to no pain due to improve mobilty and decreased in muscle spasms    Time  8    Period  Weeks    Status  New    Target Date  05/24/19            Plan - 04/11/19 0758    Clinical Impression Statement  Patient had reduction of pain after therapy. Patient has increased abdominal tissue after therapy. Patient was able to perfrom cat camel better due to reduction of right rib cage. Patient has restrictions in the thoracic and lumbar paraspinals. Patient reports he is able to feel his pelvic floor drop with diaphragmatic breathing. Patient will benefit from skilled therpay to reduce pain, understand how to manage pain, improve muscle coordination and improve function.    Personal Factors and Comorbidities  Age;Past/Current Experience;Comorbidity 1;Sex;Comorbidity 2;Comorbidity 3+;Time since onset of injury/illness/exacerbation    Comorbidities  peripheral neuropath, headaches; left knee scope, neck pain, TIA    Examination-Activity Limitations  Sit;Continence    Examination-Participation Restrictions  Community Activity    Stability/Clinical Decision Making  Evolving/Moderate complexity    Rehab Potential  Excellent    PT Frequency  1x / week    PT Duration  8 weeks    PT Treatment/Interventions  ADLs/Self Care Home Management;Biofeedback;Cryotherapy;Electrical Stimulation;Moist Heat;Therapeutic exercise;Therapeutic activities;Neuromuscular re-education;Patient/family education;Dry needling;Passive range of motion;Manual techniques;Spinal Manipulations    PT Next Visit Plan  correct pelvis, soft tissue work to pelvis, toileting technique, abdominal massage to leftlower abdomen, , dry needling,spinal mobilization    PT  Home Exercise Plan  Access Code: [facility]    Recommended Other Services  MD signed initial eval    Consulted and Agree with Plan of Care  Patient       Patient will benefit from skilled therapeutic intervention in order to improve the following deficits and impairments:  Decreased coordination, Increased fascial restricitons, Decreased endurance, Increased muscle spasms, Decreased activity tolerance, Pain, Hypomobility, Decreased mobility, Decreased strength  Visit Diagnosis: 1. Muscle weakness (generalized)   2. Cramp and spasm   3. Chronic bilateral low back pain with right-sided sciatica   4. Other lack of coordination   5. Pain in thoracic spine        Problem List Patient Active Problem List   Diagnosis Date Noted  . Anxiety 10/04/2017  . Nonallopathic lesion of rib cage 06/15/2017  . Nonallopathic lesion of cervical region 05/18/2017  . Nonallopathic lesion of thoracic region 05/18/2017  . Nonallopathic lesion of lumbosacral region 05/18/2017  . Arrhythmia 10/08/2014  . TIA (transient ischemic attack) 08/09/2014  . Lateral  meniscal tear 03/12/2014  . Testosterone deficiency 07/04/2013  . Cervicogenic headache 03/28/2013  . Vitamin D deficiency 01/01/2013  . Hyperlipidemia 01/01/2013  . BPH (benign prostatic hyperplasia) 11/03/2012  . Diverticulosis large intestine w/o perforation or abscess w/bleeding 11/03/2012  . Peripheral neuropathy 11/03/2012  . Hypothyroidism 01/18/2011  . Essential hypertension 01/18/2011  . GERD (gastroesophageal reflux disease) 01/18/2011  . Cervical spondylosis without myelopathy 11/24/2010  . Headache(784.0) 11/24/2010  . Sleep disturbance, unspecified 11/24/2010  . Syringomyelia and syringobulbia ([place]) 11/24/2010  . Disturbance of skin sensation 11/24/2010  . Cerebrovascular disease, unspecified 11/24/2010    Earlie Counts, PT 04/11/19 8:59 AM   [facility] [address]  [place], [place], [address] Phone: [phone]   Fax:  [phone]  Name: Jacob Kerr MRN: [id number] Date of Birth: 19-Nov-1950

## 2019-04-11 NOTE — Patient Instructions (Addendum)
About Abdominal Massage  Abdominal massage, also called external colon massage, is a self-treatment circular massage technique that can reduce and eliminate gas and ease constipation. The colon naturally contracts in waves in a clockwise direction starting from inside the right hip, moving up toward the ribs, across the belly, and down inside the left hip.  When you perform circular abdominal massage, you help stimulate your colon's normal wave pattern of movement called peristalsis.  It is most beneficial when done after eating.  Positioning You can practice abdominal massage with oil while lying down, or in the shower with soap.  Some people find that it is just as effective to do the massage through clothing while sitting or standing.  How to Massage Start by placing your finger tips or knuckles on your right side, just inside your hip bone.  . Make small circular movements while you move upward toward your rib cage.   . Once you reach the bottom right side of your rib cage, take your circular movements across to the left side of the bottom of your rib cage.  . Next, move downward until you reach the inside of your left hip bone.  This is the path your feces travel in your colon. . Continue to perform your abdominal massage in this pattern for 10 minutes each day.     You can apply as much pressure as is comfortable in your massage.  Start gently and build pressure as you continue to practice.  Notice any areas of pain as you massage; areas of slight pain may be relieved as you massage, but if you have areas of significant or intense pain, consult with your healthcare provider.  Other Considerations . General physical activity including bending and stretching can have a beneficial massage-like effect on the colon.  Deep breathing can also stimulate the colon because breathing deeply activates the same nervous system that supplies the colon.   . Abdominal massage should always be used in  combination with a bowel-conscious diet that is high in the proper type of fiber for you, fluids (primarily water), and a regular exercise program.  Access Code: [facility]  URL: [email or web address]  Date: 04/11/2019  Prepared by: Earlie Counts   Exercises Supine Butterfly Groin Stretch - 1 reps - 1 sets - 1 min hold - 1x daily - 7x weekly Supine Figure 4 Piriformis Stretch - 1 reps - 1 sets - 30 sec hold - 1x daily - 7x weekly Supine Hamstring Stretch - 2 reps - 1 sets - 30 sec hold - 1x daily - 7x weekly Supine Lower Trunk Rotation - 2 reps - 1 sets - 30 sec hold - 1x daily - 7x weekly Cat-Camel - 10 reps - 1 sets - 1x daily - 7x weekly Patient Education [facility] [address] [place], [place] [address] Phone # [phone] Fax [phone]

## 2019-04-18 ENCOUNTER — Ambulatory Visit: Payer: Medicare Other | Admitting: Physical Therapy

## 2019-04-18 ENCOUNTER — Encounter: Payer: Self-pay | Admitting: Physical Therapy

## 2019-04-18 ENCOUNTER — Other Ambulatory Visit: Payer: Self-pay

## 2019-04-18 DIAGNOSIS — M6281 Muscle weakness (generalized): Secondary | ICD-10-CM

## 2019-04-18 DIAGNOSIS — R252 Cramp and spasm: Secondary | ICD-10-CM | POA: Diagnosis not present

## 2019-04-18 DIAGNOSIS — M546 Pain in thoracic spine: Secondary | ICD-10-CM

## 2019-04-18 DIAGNOSIS — M5441 Lumbago with sciatica, right side: Secondary | ICD-10-CM | POA: Diagnosis not present

## 2019-04-18 DIAGNOSIS — R278 Other lack of coordination: Secondary | ICD-10-CM | POA: Diagnosis not present

## 2019-04-18 DIAGNOSIS — G8929 Other chronic pain: Secondary | ICD-10-CM | POA: Diagnosis not present

## 2019-04-18 NOTE — Therapy (Signed)
[facility] [address], [place], [place], [address] Phone: [phone]   Fax:  [phone]  Physical Therapy Treatment  Patient Details  Name: Jacob Kerr MRN: [id number] Date of Birth: 02-07-1951 Referring Provider (PT): Dr. Ronnie Doss   Encounter Date: 04/18/2019  PT End of Session - 04/18/19 0949    Visit Number  3    Date for PT Re-Evaluation  05/24/19    Authorization Type  [employer]HC Medicare    PT Start Time  0907    PT Stop Time  0945    PT Time Calculation (min)  38 min    Activity Tolerance  Patient tolerated treatment well;No increased pain    Behavior During Therapy  WFL for tasks assessed/performed       Past Medical History:  Diagnosis Date  . Acute meniscal tear of left knee   . Allergic rhinitis   . BPH (benign prostatic hypertrophy)   . Cervical spondylosis without myelopathy   . Cervicogenic headache   . Chronic neck pain    uses TENS unit  prn  . Diverticulitis   . GERD (gastroesophageal reflux disease)   . H/O hiatal hernia   . History of diverticulitis   . History of kidney stones   . History of TIA (transient ischemic attack)    04/ 2011---  no residuals  . Hypertension   . Hypothyroidism   . Peripheral neuropathy   . Shingles   . Syrinx of spinal cord ([place])    C4 -- C7    Past Surgical History:  Procedure Laterality Date  . CARDIAC CATHETERIZATION  02-18-2010   dr Angelena Form   normal coronary arteries/  normal lvsf--  ef 65%  . COLONOSCOPY    . [place]  . HERNIA REPAIR     [facility] [place] [facility]  . KNEE ARTHROSCOPY Left 03/13/2014   Procedure: LEFT ARTHROSCOPY KNEE WITH DEBRIDMENT;  Surgeon: Gearlean Alf, MD;  Location: [facility];  Service: Orthopedics;  Laterality: Left;  . LAPAROSCOPIC CHOLECYSTECTOMY  01-30-2003  . NEGATIVE SLEEP STUDY  2013   per pt  . RADIOFREQUENCY ABLATION NERVES  05/2016  . REMOVAL FORGEIN BODY FROM EAR   AGE 68  . RIGHT URETEROSCOPIC LASER LITHOTRIPSY STONE EXTRACTION /  STENT PLACEMENT  05-10-2000  . TRANSTHORACIC ECHOCARDIOGRAM  01-16-2010   normal lvf/  ef  60%/  mild lae    There were no vitals filed for this visit.  Subjective Assessment - 04/18/19 0907    Subjective  I felt good for 30 minutes. I have been massaging my abdomen and doing the stretches and they help.  My pain does not catch my breath anymore.    Pertinent History  Peripheral neuropathy; headaches; left knee scope; neck pain; TIA.    Patient Stated Goals  reduce pelvic pain, go hunting or hiking without worrying about incontinence, keep up with 68 year old grandson    Pain Score  8     Pain Location  Back    Pain Orientation  Right;Lower    Pain Descriptors / Indicators  Aching;Cramping;Discomfort;Sharp;Shooting    Pain Type  Chronic pain    Pain Onset  More than a month ago    Pain Frequency  Constant    Aggravating Factors   being upright    Pain Relieving Factors  hot water, heating pad, TENS unit    Multiple Pain Sites  Yes    Pain Score  3    Pain Location  Rectum    Pain Orientation  Mid    Pain Descriptors / Indicators  --   vibration   Pain Type  Chronic pain    Pain Onset  More than a month ago    Pain Frequency  Intermittent    Aggravating Factors   after intercourse, sitting    Pain Relieving Factors  being completely still, hot water                       [facility] Adult PT Treatment/Exercise - 04/18/19 0001      Therapeutic Activites    Therapeutic Activities  Other Therapeutic Activities    Other Therapeutic Activities  instructed patient on correct toileting technique to relax the rectum , diaphragmatic breathing      Manual Therapy   Manual Therapy  Joint mobilization;Soft tissue mobilization;Myofascial release    Manual therapy comments  manually stretched bilateral hip flexor and ITB band    Joint Mobilization  p-a mobilization and rotational to T5-L5 in prone    Soft tissue  mobilization  thoracic and lumbar paraspinals, bilateral quadratus    Myofascial Release  tissue rolling along thoracic and lumbar, release in quadruped by pulling the fascial forward to abdoment releasing anterior trunk tissue       Trigger Point Dry Needling - 04/18/19 0001    Consent Given?  Yes    Education Handout Provided  Yes    Muscles Treated Back/Hip  Lumbar multifidi;Quadratus lumborum    Other Dry Needling  T10-T12   bil.    Lumbar multifidi Response  Twitch response elicited;Palpable increased muscle length    Quadratus Lumborum Response  Twitch response elicited;Palpable increased muscle length   right            PT Short Term Goals - 04/18/19 6384      PT SHORT TERM GOAL #1   Title  independent with initial HEP    Time  4    Period  Weeks    Status  Achieved      PT SHORT TERM GOAL #2   Title  understand how to toilet correctly for bowel movement and urinating with relaxation of the pelvic floor    Baseline  just learned    Time  4    Period  Weeks    Status  On-going    Target Date  04/26/19      PT SHORT TERM GOAL #3   Title  sittng with pain decreased >/= 25%    Time  4    Period  Weeks    Status  Achieved    Target Date  04/26/19      PT SHORT TERM GOAL #4   Title  pain after ejaculation decreased >/= 25%    Time  4    Period  Weeks    Status  On-going    Target Date  04/26/19        PT Long Term Goals - 03/29/19 0957      PT LONG TERM GOAL #1   Title  Independent with a HEP.    Time  8    Period  Weeks    Status  New    Target Date  05/24/19      PT LONG TERM GOAL #2   Title  able to sit with pain level improved >/= 75% due to ability to relax the pelvic floor  Time  8    Period  Weeks    Status  New    Target Date  05/24/19      PT LONG TERM GOAL #3   Title  pain after ejaculation decreased >/= 75%    Time  8    Period  Weeks    Status  New    Target Date  05/24/19      PT LONG TERM GOAL #4   Title  able to  initiate a urine stream and bowel movement without difficulty    Time  8    Period  Weeks    Status  New    Target Date  05/24/19      PT LONG TERM GOAL #5   Title  able to play with his 68 year old grandson with minimal to no pain due to improve mobilty and decreased in muscle spasms    Time  8    Period  Weeks    Status  New    Target Date  05/24/19            Plan - 04/18/19 0911    Clinical Impression Statement  Patient reports his rectal pain is 25% better. After treatment pelvis in correct alignment and pain decreased to 2/10. Patient had full flexion of lumbar after treatment. Patient understands how to perform diaphragmatic breathing and correct toileting technique. Patient will benefit from skilled therapy to reduce pain, understand how to manage pain, improve muscle coordination and improve function.    Personal Factors and Comorbidities  Age;Past/Current Experience;Comorbidity 1;Sex;Comorbidity 2;Comorbidity 3+;Time since onset of injury/illness/exacerbation    Comorbidities  peripheral neuropath, headaches; left knee scope, neck pain, TIA    Examination-Activity Limitations  Sit;Continence    Examination-Participation Restrictions  Community Activity    Stability/Clinical Decision Making  Evolving/Moderate complexity    Rehab Potential  Excellent    PT Frequency  1x / week    PT Duration  8 weeks    PT Treatment/Interventions  ADLs/Self Care Home Management;Biofeedback;Cryotherapy;Electrical Stimulation;Moist Heat;Therapeutic exercise;Therapeutic activities;Neuromuscular re-education;Patient/family education;Dry needling;Passive range of motion;Manual techniques;Spinal Manipulations    PT Next Visit Plan  soft tissue work to pelvis, abdominal massage to leftlower abdomen, , assess dry needling,spinal mobilization    PT Home Exercise Plan  Access Code: [email or web address]    Consulted and Agree with Plan of Care  Patient       Patient will benefit from skilled therapeutic  intervention in order to improve the following deficits and impairments:  Decreased coordination, Increased fascial restricitons, Decreased endurance, Increased muscle spasms, Decreased activity tolerance, Pain, Hypomobility, Decreased mobility, Decreased strength  Visit Diagnosis: 1. Muscle weakness (generalized)   2. Cramp and spasm   3. Chronic bilateral low back pain with right-sided sciatica   4. Other lack of coordination   5. Pain in thoracic spine        Problem List Patient Active Problem List   Diagnosis Date Noted  . Anxiety 10/04/2017  . Nonallopathic lesion of rib cage 06/15/2017  . Nonallopathic lesion of cervical region 05/18/2017  . Nonallopathic lesion of thoracic region 05/18/2017  . Nonallopathic lesion of lumbosacral region 05/18/2017  . Arrhythmia 10/08/2014  . TIA (transient ischemic attack) 08/09/2014  . Lateral meniscal tear 03/12/2014  . Testosterone deficiency 07/04/2013  . Cervicogenic headache 03/28/2013  . Vitamin D deficiency 01/01/2013  . Hyperlipidemia 01/01/2013  . BPH (benign prostatic hyperplasia) 11/03/2012  . Diverticulosis large intestine w/o perforation or abscess w/bleeding 11/03/2012  . Peripheral  neuropathy 11/03/2012  . Hypothyroidism 01/18/2011  . Essential hypertension 01/18/2011  . GERD (gastroesophageal reflux disease) 01/18/2011  . Cervical spondylosis without myelopathy 11/24/2010  . Headache(784.0) 11/24/2010  . Sleep disturbance, unspecified 11/24/2010  . Syringomyelia and syringobulbia ([place]) 11/24/2010  . Disturbance of skin sensation 11/24/2010  . Cerebrovascular disease, unspecified 11/24/2010    Earlie Counts, PT 04/18/19 9:54 AM   [facility] [address] [place], [place], [address] Phone: [phone]   Fax:  [phone]  Name: Jacob Kerr MRN: [id number] Date of Birth: 03-05-1951

## 2019-04-18 NOTE — Patient Instructions (Addendum)
Toileting Techniques for Bowel Movements (Defecation) Using your belly (abdomen) and pelvic floor muscles to have a bowel movement is usually instinctive.  Sometimes people can have problems with these muscles and have to relearn proper defecation (emptying) techniques.  If you have weakness in your muscles, organs that are falling out, decreased sensation in your pelvis, or ignore your urge to go, you may find yourself straining to have a bowel movement.  You are straining if you are: . holding your breath or taking in a huge gulp of air and holding it  . keeping your lips and jaw tensed and closed tightly . turning red in the face because of excessive pushing or forcing . developing or worsening your  hemorrhoids . getting faint while pushing . not emptying completely and have to defecate many times a day  If you are straining, you are actually making it harder for yourself to have a bowel movement.  Many people find they are pulling up with the pelvic floor muscles and closing off instead of opening the anus. Due to lack pelvic floor relaxation and coordination the abdominal muscles, one has to work harder to push the feces out.  Many people have never been taught how to defecate efficiently and effectively.  Notice what happens to your body when you are having a bowel movement.  While you are sitting on the toilet pay attention to the following areas: . Jaw and mouth position . Angle of your hips   . Whether your feet touch the ground or not . Arm placement  . Spine position . Waist . Belly tension . Anus (opening of the anal canal)  An Evacuation/Defecation Plan   Here are the 4 basic points:  1. Lean forward enough for your elbows to rest on your knees 2. Support your feet on the floor or use a low stool if your feet don't touch the floor  3. Push out your belly as if you have swallowed a beach ball-you should feel a widening of your waist 4. Open and relax your pelvic floor muscles,  rather than tightening around the anus       The following conditions my require modifications to your toileting posture:  . If you have had surgery in the past that limits your back, hip, pelvic, knee or ankle flexibility . Constipation   Your healthcare practitioner may make the following additional suggestions and adjustments:  1) Sit on the toilet  a) Make sure your feet are supported. b) Notice your hip angle and spine position-most people find it effective to lean forward or raise their knees, which can help the muscles around the anus to relax  c) When you lean forward, place your forearms on your thighs for support  2) Relax suggestions a) Breath deeply in through your nose and out slowly through your mouth as if you are smelling the flowers and blowing out the candles. b) To become aware of how to relax your muscles, contracting and releasing muscles can be helpful.  Pull your pelvic floor muscles in tightly by using the image of holding back gas, or closing around the anus (visualize making a circle smaller) and lifting the anus up and in.  Then release the muscles and your anus should drop down and feel open. Repeat 5 times ending with the feeling of relaxation. c) Keep your pelvic floor muscles relaxed; let your belly bulge out. d) The digestive tract starts at the mouth and ends at the anal opening, so   be sure to relax both ends of the tube.  Place your tongue on the roof of your mouth with your teeth separated.  This helps relax your mouth and will help to relax the anus at the same time.  3) Empty (defecation) a) Keep your pelvic floor and sphincter relaxed, then bulge your anal muscles.  Make the anal opening wide.  b) Stick your belly out as if you have swallowed a beach ball. c) Make your belly wall hard using your belly muscles while continuing to breathe. Doing this makes it easier to open your anus. d) Breath out and give a grunt (or try using other sounds such as  ahhhh, shhhhh, ohhhh or grrrrrrr).  4) Finish a) As you finish your bowel movement, pull the pelvic floor muscles up and in.  This will leave your anus in the proper place rather than remaining pushed out and down. If you leave your anus pushed out and down, it will start to feel as though that is normal and give you incorrect signals about needing to have a bowel movement. Trigger Point Dry Needling  . What is Trigger Point Dry Needling (DN)? o DN is a physical therapy technique used to treat muscle pain and dysfunction. Specifically, DN helps deactivate muscle trigger points (muscle knots).  o A thin filiform needle is used to penetrate the skin and stimulate the underlying trigger point. The goal is for a local twitch response (LTR) to occur and for the trigger point to relax. No medication of any kind is injected during the procedure.   . What Does Trigger Point Dry Needling Feel Like?  o The procedure feels different for each individual patient. Some patients report that they do not actually feel the needle enter the skin and overall the process is not painful. Very mild bleeding may occur. However, many patients feel a deep cramping in the muscle in which the needle was inserted. This is the local twitch response.   Marland Kitchen How Will I feel after the treatment? o Soreness is normal, and the onset of soreness may not occur for a few hours. Typically this soreness does not last longer than two days.  o Bruising is uncommon, however; ice can be used to decrease any possible bruising.  o In rare cases feeling tired or nauseous after the treatment is normal. In addition, your symptoms may get worse before they get better, this period will typically not last longer than 24 hours.   . What Can I do After My Treatment? o Increase your hydration by drinking more water for the next 24 hours. o You may place ice or heat on the areas treated that have become sore, however, do not use heat on inflamed or  bruised areas. Heat often brings more relief post needling. o You can continue your regular activities, but vigorous activity is not recommended initially after the treatment for 24 hours. o DN is best combined with other physical therapy such as strengthening, stretching, and other therapies.    [facility] [address], [place], [place] [address] Phone # [phone] Fax [phone]

## 2019-04-19 ENCOUNTER — Other Ambulatory Visit: Payer: Self-pay | Admitting: *Deleted

## 2019-04-19 ENCOUNTER — Encounter: Payer: Self-pay | Admitting: Family Medicine

## 2019-04-19 DIAGNOSIS — E034 Atrophy of thyroid (acquired): Secondary | ICD-10-CM

## 2019-04-19 MED ORDER — LEVOTHYROXINE SODIUM 150 MCG PO TABS: 150.0000 ug | ORAL_TABLET | Freq: Every day | ORAL | 1 refills | Status: DC

## 2019-04-19 MED ORDER — LOSARTAN POTASSIUM 50 MG PO TABS: 50.0000 mg | ORAL_TABLET | Freq: Every day | ORAL | 1 refills | Status: DC

## 2019-04-27 IMAGING — MR MR CERVICAL SPINE W/O CM
5 series · 34 of 48 positions shown · non-contrast
Comparison: Prior CT from earlier the same day as well as previous
MRIs from 02/28/2017 and 12/26/2015.

CLINICAL DATA: Initial evaluation for intermittent weakness,
tingling, primarily affecting the right side.

EXAM:
MRI HEAD WITHOUT CONTRAST
MRI CERVICAL SPINE WITHOUT CONTRAST
TECHNIQUE: Multiplanar, multiecho pulse sequences of the brain and surrounding
structures, and cervical spine, to include the craniocervical
junction and cervicothoracic junction, were obtained without
intravenous contrast.

[Series 5: T2 · sagittal · 3.0mm · 0.69mm/px · 6 of 15 slices shown (1 of 2)]
[im 1/15]
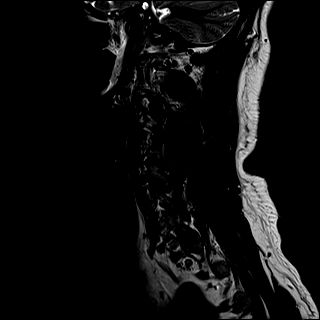
[im 3/15]
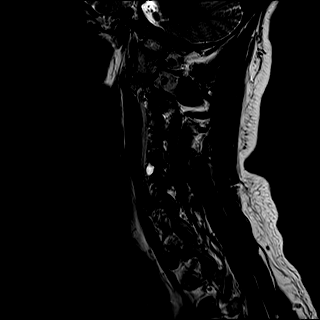
[im 6/15]
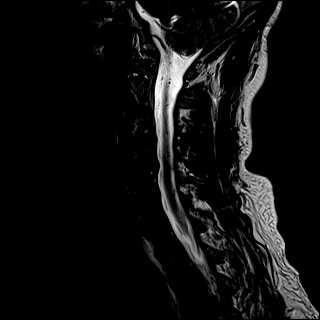
[im 9/15]
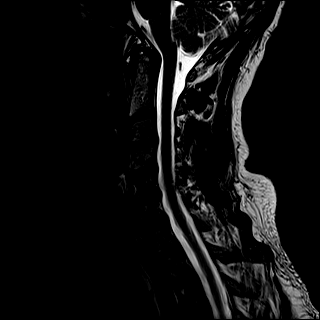
[im 12/15]
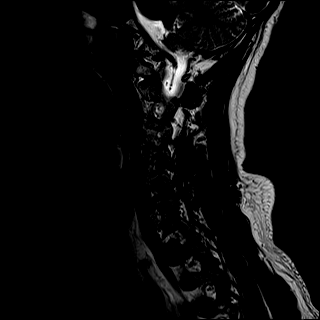
[im 15/15]
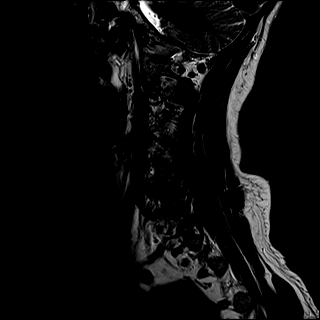

[Series 6: T1 · sagittal · 3.0mm · 0.69mm/px · 6 of 15 slices shown]
[im 1/15]
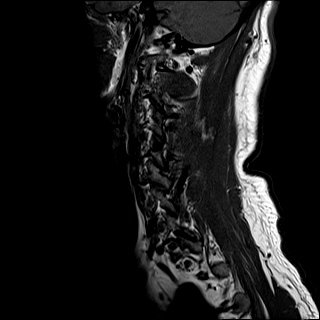
[im 3/15]
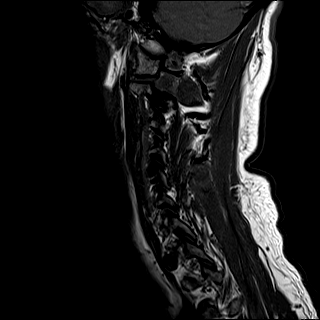
[im 6/15]
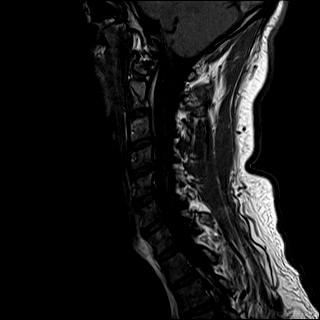
[im 9/15]
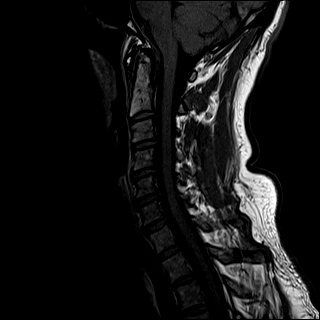
[im 12/15]
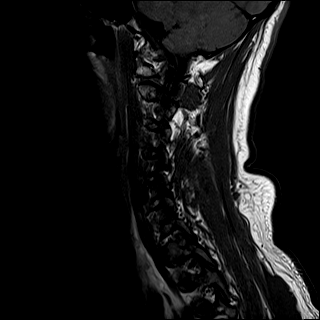
[im 15/15]
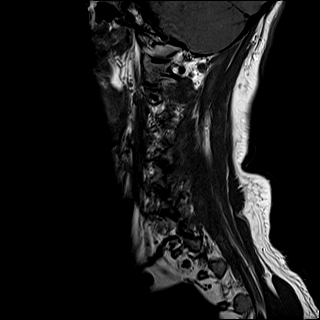

[Series 7: STIR · sagittal · 3.0mm · 0.86mm/px · 6 of 15 slices shown]
[im 1/15]
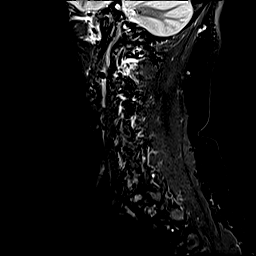
[im 3/15]
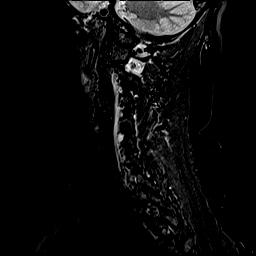
[im 6/15]
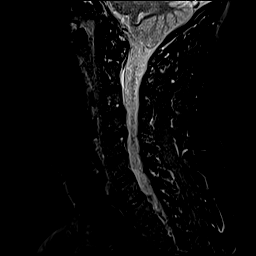
[im 9/15]
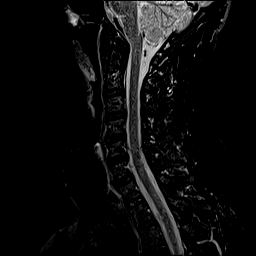
[im 12/15]
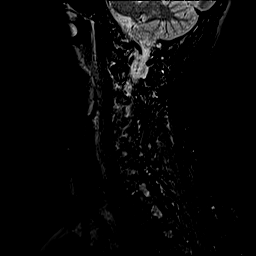
[im 15/15]
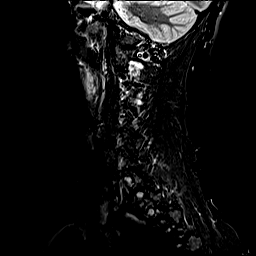

[Series 8: T2 · axial · 3.0mm · 0.66mm/px · z∈[-178,-81]mm · 9 of 35 slices shown (2 of 2)]
[im 1/35]
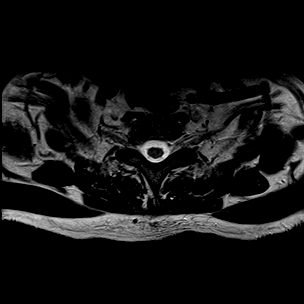
[im 5/35]
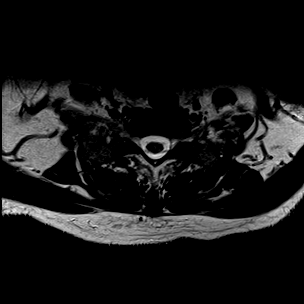
[im 10/35]
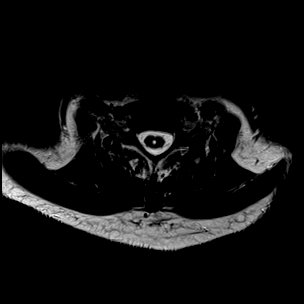
[im 15/35]
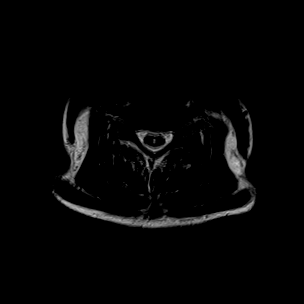
[im 18/35]
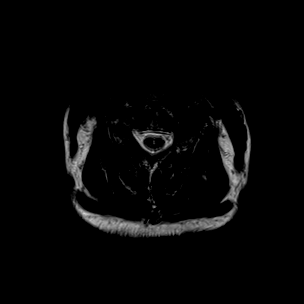
[im 20/35]
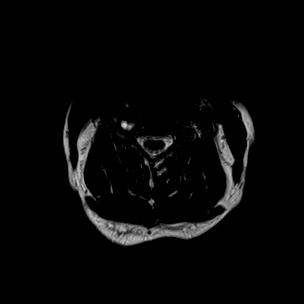
[im 25/35]
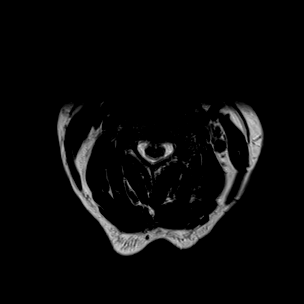
[im 30/35]
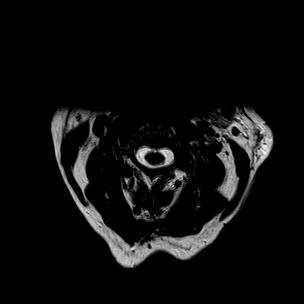
[im 35/35]
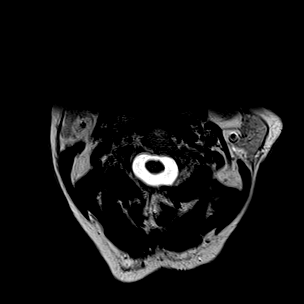

[Series 9: GRE · axial · 3.0mm · 0.39mm/px · z∈[-178,-95]mm · 7 of 35 slices shown]
[im 1/35]
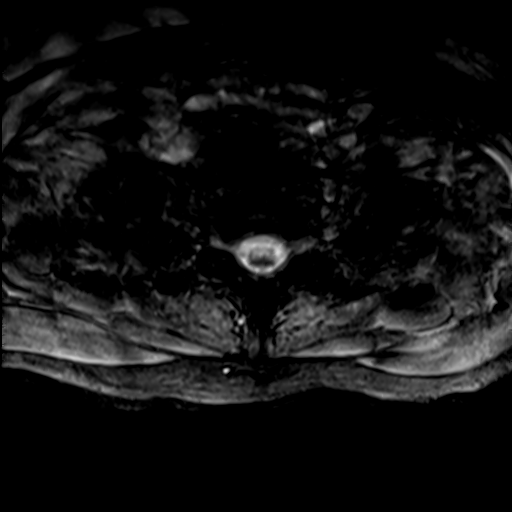
[im 5/35]
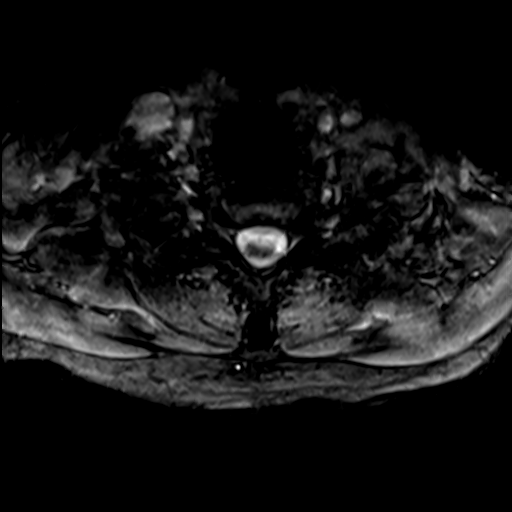
[im 10/35]
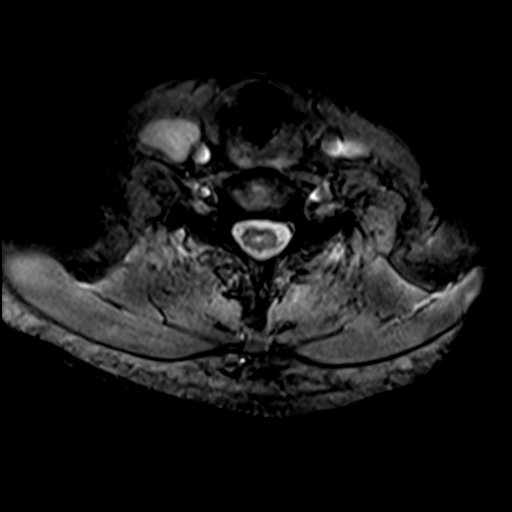
[im 15/35]
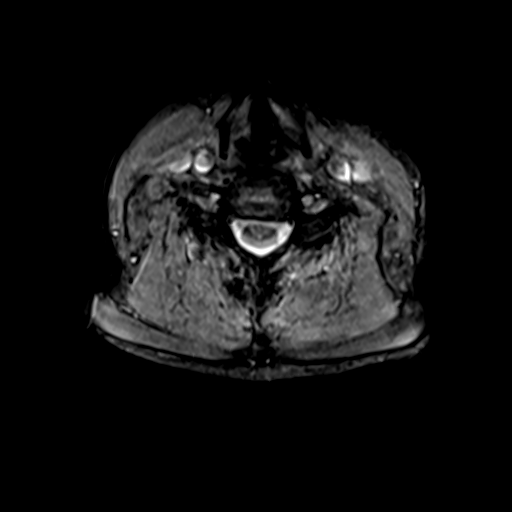
[im 20/35]
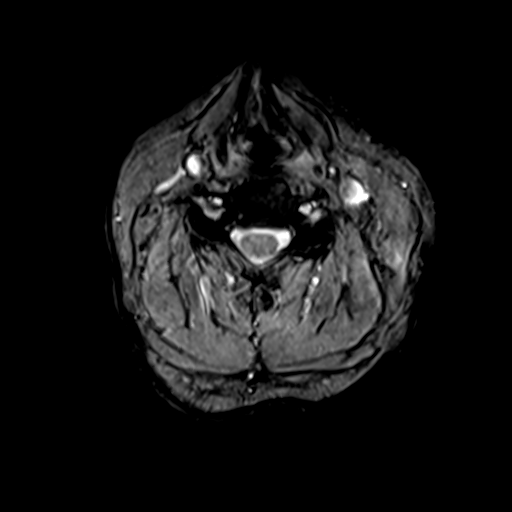
[im 25/35]
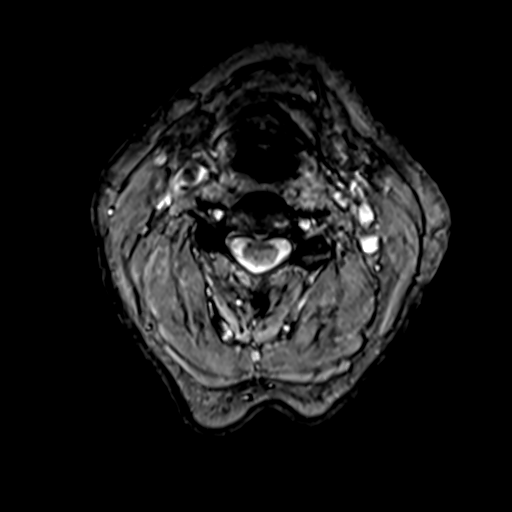
[im 30/35]
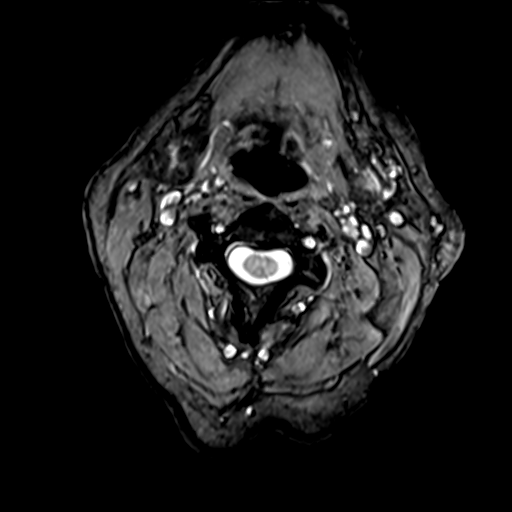

[34 of 48 positions shown; findings below may reference images not displayed]

FINDINGS: MRI HEAD FINDINGS

Brain: Cerebral volume within normal limits for patient age. Few
scattered subcentimeter T2/FLAIR hyperintensities noted within the
subcortical and deep white matter of both cerebral hemispheres, most
prominent at the frontal lobes, nonspecific, but mild for age.

No abnormal foci of restricted diffusion to suggest acute or
subacute ischemia. Gray-white matter differentiation well
maintained. No encephalomalacia to suggest chronic infarction. No
foci of susceptibility artifact to suggest acute or chronic
intracranial hemorrhage.

Mass lesion, midline shift or mass effect. No hydrocephalus. No
extra-axial fluid collection. Major dural sinuses are grossly
patent.

Pituitary gland and suprasellar region are normal. Midline
structures intact and normal.

Vascular: Major intracranial vascular flow voids well maintained and
normal in appearance.

Skull and upper cervical spine: Craniocervical junction normal.
Visualized upper cervical spine within normal limits. Bone marrow
signal intensity normal. No scalp soft tissue abnormality.

Sinuses/Orbits: Globes and orbital soft tissues within normal
limits. Paranasal sinuses are clear. No mastoid effusion. Inner ear
structures normal.

Other: None.

MRI CERVICAL SPINE FINDINGS

Alignment: Straightening with slight reversal of the normal upper
cervical lordosis. No listhesis.

Vertebrae: Vertebral body heights maintained without evidence for
acute or chronic fracture. Bone marrow signal intensity within
normal limits. No discrete or worrisome osseous lesions. No abnormal
marrow edema.

Cord: Small syrinx extending from approximately the C4-5 level
through C7 seen. This measures up to 3 mm in maximal diameter at the
level of C6. Overall, appearance relatively stable from previous.
Signal intensity within the cervical spinal cord otherwise within
normal limits.

Posterior Fossa, vertebral arteries, paraspinal tissues:
Craniocervical junction within normal limits. Paraspinous and
prevertebral soft tissues normal. Normal intravascular flow voids
present within the vertebral arteries bilaterally.

Disc levels:

C2-C3: Unremarkable.

C3-C4: Small central disc protrusion indents the ventral thecal sac.
No significant stenosis or cord deformity. Foramina remain patent.

C4-C5: Right eccentric disc bulge with right greater than left
uncovertebral hypertrophy. Flattening of the ventral thecal sac
without significant spinal stenosis. Moderate right C5 foraminal
stenosis, similar to previous exams. Mild left foraminal narrowing.

C5-C6: Mild disc bulge with uncovertebral spurring. No significant
spinal stenosis. Mild left C6 foraminal narrowing.

C6-C7: Mild disc bulge with uncovertebral spurring. Mild left-sided
facet hypertrophy. No spinal stenosis. Mild left C7 foraminal
narrowing.

C7-T1:  Unremarkable.

Visualized upper thoracic spine within normal limits.
IMPRESSION: MRI HEAD IMPRESSION:

1. No acute intracranial abnormality.
2. Mild for age nonspecific cerebral white matter changes.

MRA HEAD IMPRESSION:

1. Overall similar appearance of the cervical spine as compared to
8640.
2. Small central syrinx at C4-5 through C7 measuring up to 3 mm in
maximal diameter.
3. Chronic right eccentric disc osteophyte at C4-5 with resultant
moderate right C5 foraminal stenosis. Finding could result in
right-sided symptoms.

## 2019-05-02 ENCOUNTER — Ambulatory Visit: Payer: Medicare Other | Attending: Family Medicine | Admitting: Physical Therapy

## 2019-05-02 ENCOUNTER — Other Ambulatory Visit: Payer: Self-pay

## 2019-05-02 ENCOUNTER — Encounter: Payer: Medicare Other | Admitting: Physical Therapy

## 2019-05-02 ENCOUNTER — Encounter: Payer: Self-pay | Admitting: Physical Therapy

## 2019-05-02 DIAGNOSIS — R252 Cramp and spasm: Secondary | ICD-10-CM | POA: Diagnosis not present

## 2019-05-02 DIAGNOSIS — M5441 Lumbago with sciatica, right side: Secondary | ICD-10-CM | POA: Diagnosis not present

## 2019-05-02 DIAGNOSIS — M6281 Muscle weakness (generalized): Secondary | ICD-10-CM | POA: Diagnosis not present

## 2019-05-02 DIAGNOSIS — R278 Other lack of coordination: Secondary | ICD-10-CM

## 2019-05-02 DIAGNOSIS — M546 Pain in thoracic spine: Secondary | ICD-10-CM | POA: Insufficient documentation

## 2019-05-02 DIAGNOSIS — G8929 Other chronic pain: Secondary | ICD-10-CM | POA: Insufficient documentation

## 2019-05-02 NOTE — Therapy (Signed)
[facility] [address] [place], [place], [address] Phone: [phone]   Fax:  [phone]  Physical Therapy Treatment  Patient Details  Name: Jacob Kerr MRN: [id number] Date of Birth: 1951/07/15 Referring Provider (PT): Dr. Ronnie Doss   Encounter Date: 05/02/2019  PT End of Session - 05/02/19 1034    Visit Number  4    Date for PT Re-Evaluation  05/24/19    Authorization Type  [employer] Medicare    PT Start Time  0945    PT Stop Time  1030    PT Time Calculation (min)  45 min    Activity Tolerance  Patient tolerated treatment well;No increased pain    Behavior During Therapy  WFL for tasks assessed/performed       Past Medical History:  Diagnosis Date  . Acute meniscal tear of left knee   . Allergic rhinitis   . BPH (benign prostatic hypertrophy)   . Cervical spondylosis without myelopathy   . Cervicogenic headache   . Chronic neck pain    uses TENS unit  prn  . Diverticulitis   . GERD (gastroesophageal reflux disease)   . H/O hiatal hernia   . History of diverticulitis   . History of kidney stones   . History of TIA (transient ischemic attack)    04/ 2011---  no residuals  . Hypertension   . Hypothyroidism   . Peripheral neuropathy   . Shingles   . Syrinx of spinal cord ([place])    C4 -- C7    Past Surgical History:  Procedure Laterality Date  . CARDIAC CATHETERIZATION  02-18-2010   dr Angelena Form   normal coronary arteries/  normal lvsf--  ef 65%  . COLONOSCOPY    . [place]  . HERNIA REPAIR     [facility] [place] [facility]  . KNEE ARTHROSCOPY Left 03/13/2014   Procedure: LEFT ARTHROSCOPY KNEE WITH DEBRIDMENT;  Surgeon: Gearlean Alf, MD;  Location: [facility];  Service: Orthopedics;  Laterality: Left;  . LAPAROSCOPIC CHOLECYSTECTOMY  01-30-2003  . NEGATIVE SLEEP STUDY  2013   per pt  . RADIOFREQUENCY ABLATION NERVES  05/2016  . REMOVAL FORGEIN BODY FROM EAR   AGE 6  . RIGHT URETEROSCOPIC LASER LITHOTRIPSY STONE EXTRACTION /  STENT PLACEMENT  05-10-2000  . TRANSTHORACIC ECHOCARDIOGRAM  01-16-2010   normal lvf/  ef  60%/  mild lae    There were no vitals filed for this visit.  Subjective Assessment - 05/02/19 0950    Subjective  I felt pretty good and lasted several hours. REctal pain is 40% better. I have more trouble with starting and finishing a bowel movement.    Pertinent History  Peripheral neuropathy; headaches; left knee scope; neck pain; TIA.    Patient Stated Goals  reduce pelvic pain, go hunting or hiking without worrying about incontinence, keep up with 36 year old grandson    Currently in Pain?  Yes    Pain Score  7     Pain Location  Back    Pain Orientation  Right;Lower    Pain Descriptors / Indicators  Aching;Cramping    Pain Type  Chronic pain    Pain Onset  More than a month ago    Pain Frequency  Constant    Aggravating Factors   movement, being upright    Pain Relieving Factors  hot water, heating pad, TENS unit    Multiple Pain Sites  Yes  Pain Score  3    Pain Location  Rectum    Pain Orientation  Mid    Pain Descriptors / Indicators  --   vibration   Pain Type  Chronic pain    Pain Onset  More than a month ago    Pain Frequency  Intermittent    Aggravating Factors   after intercourse, sitting    Pain Relieving Factors  being completely still, hot water                    Pelvic Floor Special Questions - 05/02/19 0001    Pelvic Floor Internal Exam  Patient confirms identification and approves PT to assess muscles and treatment    Exam Type  Rectal    Palpation  tender in the obturator internist and levator ani    Strength  fair squeeze, definite lift        [facility] Adult PT Treatment/Exercise - 05/02/19 0001      Neuro Re-ed    Neuro Re-ed Details   pelvic floor contraction with index finger in the anal canal to work on Conservator, museum/gallery Therapy   Manual Therapy  Joint mobilization;Soft  tissue mobilization;Internal Pelvic Floor    Joint Mobilization  p-a mobilization and rotational to T5-L5 in prone    Soft tissue mobilization  thoracic and lumbar paraspinals, bilateral quadratus    Internal Pelvic Floor  soft tissue work to the obturator internis, puborectalis for elongation       Trigger Point Dry Needling - 05/02/19 0001    Consent Given?  Yes    Muscles Treated Back/Hip  Lumbar multifidi    Other Dry Needling  T5-T12    Lumbar multifidi Response  Twitch response elicited;Palpable increased muscle length           PT Education - 05/02/19 1033    Education Details  Access Code: [email or web address]    Person(s) Educated  Patient    Methods  Explanation;Demonstration;Handout    Comprehension  Verbalized understanding;Returned demonstration       PT Short Term Goals - 05/02/19 0954      PT SHORT TERM GOAL #1   Title  independent with initial HEP    Time  4    Period  Weeks    Status  Achieved    Target Date  04/26/19      PT SHORT TERM GOAL #2   Title  understand how to toilet correctly for bowel movement and urinating with relaxation of the pelvic floor    Time  4    Period  Weeks    Status  Achieved    Target Date  04/26/19      PT SHORT TERM GOAL #3   Title  sittng with pain decreased >/= 25%    Time  4    Period  Weeks    Status  Achieved    Target Date  04/26/19      PT SHORT TERM GOAL #4   Title  pain after ejaculation decreased >/= 25%    Baseline  has not had intercourse lately    Time  4    Period  Weeks    Status  On-going    Target Date  04/26/19        PT Long Term Goals - 03/29/19 0957      PT LONG TERM GOAL #1   Title  Independent with a HEP.    Time  8    Period  Weeks    Status  New    Target Date  05/24/19      PT LONG TERM GOAL #2   Title  able to sit with pain level improved >/= 75% due to ability to relax the pelvic floor    Time  8    Period  Weeks    Status  New    Target Date  05/24/19      PT LONG TERM GOAL #3    Title  pain after ejaculation decreased >/= 75%    Time  8    Period  Weeks    Status  New    Target Date  05/24/19      PT LONG TERM GOAL #4   Title  able to initiate a urine stream and bowel movement without difficulty    Time  8    Period  Weeks    Status  New    Target Date  05/24/19      PT LONG TERM GOAL #5   Title  able to play with his 20 year old grandson with minimal to no pain due to improve mobilty and decreased in muscle spasms    Time  8    Period  Weeks    Status  New    Target Date  05/24/19            Plan - 05/02/19 0956    Clinical Impression Statement  Patient rectal pain is 405 better. Patient has improved tissue mobility of the lumbar and thoracic area to improve pelvic mobility. Patient pelvic floor strength improved to 3/5. Patient still has some urinary leakage and trouble initiating a urine stream and stop. Patient is not moving his body as much when sitting. Patient will benefit from skilled therapy to improve coordination, mobility and function.    Personal Factors and Comorbidities  Age;Past/Current Experience;Comorbidity 1;Sex;Comorbidity 2;Comorbidity 3+;Time since onset of injury/illness/exacerbation    Comorbidities  peripheral neuropath, headaches; left knee scope, neck pain, TIA    Examination-Activity Limitations  Sit;Continence    Examination-Participation Restrictions  Community Activity    Stability/Clinical Decision Making  Evolving/Moderate complexity    Rehab Potential  Excellent    PT Frequency  1x / week    PT Duration  8 weeks    PT Treatment/Interventions  ADLs/Self Care Home Management;Biofeedback;Cryotherapy;Electrical Stimulation;Moist Heat;Therapeutic exercise;Therapeutic activities;Neuromuscular re-education;Patient/family education;Dry needling;Passive range of motion;Manual techniques;Spinal Manipulations    PT Next Visit Plan  soft tissue work to pelvis, abdominal massage to leftlower abdomen, internal soft tissue work,     PT Home Exercise Plan  Access Code: [email or web address]    Consulted and Agree with Plan of Care  Patient       Patient will benefit from skilled therapeutic intervention in order to improve the following deficits and impairments:  Decreased coordination, Increased fascial restricitons, Decreased endurance, Increased muscle spasms, Decreased activity tolerance, Pain, Hypomobility, Decreased mobility, Decreased strength  Visit Diagnosis: 1. Muscle weakness (generalized)   2. Cramp and spasm   3. Chronic bilateral low back pain with right-sided sciatica   4. Other lack of coordination   5. Pain in thoracic spine        Problem List Patient Active Problem List   Diagnosis Date Noted  . Anxiety 10/04/2017  . Nonallopathic lesion of rib cage 06/15/2017  . Nonallopathic lesion of cervical region 05/18/2017  . Nonallopathic lesion of thoracic region 05/18/2017  . Nonallopathic lesion of lumbosacral region  05/18/2017  . Arrhythmia 10/08/2014  . TIA (transient ischemic attack) 08/09/2014  . Lateral meniscal tear 03/12/2014  . Testosterone deficiency 07/04/2013  . Cervicogenic headache 03/28/2013  . Vitamin D deficiency 01/01/2013  . Hyperlipidemia 01/01/2013  . BPH (benign prostatic hyperplasia) 11/03/2012  . Diverticulosis large intestine w/o perforation or abscess w/bleeding 11/03/2012  . Peripheral neuropathy 11/03/2012  . Hypothyroidism 01/18/2011  . Essential hypertension 01/18/2011  . GERD (gastroesophageal reflux disease) 01/18/2011  . Cervical spondylosis without myelopathy 11/24/2010  . Headache(784.0) 11/24/2010  . Sleep disturbance, unspecified 11/24/2010  . Syringomyelia and syringobulbia ([place]) 11/24/2010  . Disturbance of skin sensation 11/24/2010  . Cerebrovascular disease, unspecified 11/24/2010    Earlie Counts, PT 05/02/19 10:45 AM   [facility] [address], [place], [place], [address] Phone: [phone]    Fax:  [phone]  Name: Jacob Kerr MRN: [id number] Date of Birth: Jan 30, 1951

## 2019-05-02 NOTE — Patient Instructions (Signed)
Access Code: [facility]  URL: [email or web address]  Date: 05/02/2019  Prepared by: Earlie Counts   Exercises Supine Butterfly Groin Stretch - 1 reps - 1 sets - 1 min hold - 1x daily - 7x weekly Supine Figure 4 Piriformis Stretch - 1 reps - 1 sets - 30 sec hold - 1x daily - 7x weekly Supine Hamstring Stretch - 2 reps - 1 sets - 30 sec hold - 1x daily - 7x weekly Supine Lower Trunk Rotation - 2 reps - 1 sets - 30 sec hold - 1x daily - 7x weekly Cat-Camel - 10 reps - 1 sets - 1x daily - 7x weekly Seated Pelvic Floor Contraction - 10 reps - 1 sets - 5 sec hold - 3x daily - 7x weekly Patient Education Trigger [facility] [address] [place], [place] [address] Phone # [phone] Fax [phone]

## 2019-05-09 ENCOUNTER — Encounter: Payer: Medicare Other | Admitting: Physical Therapy

## 2019-05-09 ENCOUNTER — Ambulatory Visit: Payer: Medicare Other | Admitting: Physical Therapy

## 2019-05-09 ENCOUNTER — Encounter: Payer: Self-pay | Admitting: Physical Therapy

## 2019-05-09 ENCOUNTER — Other Ambulatory Visit: Payer: Self-pay

## 2019-05-09 DIAGNOSIS — M6281 Muscle weakness (generalized): Secondary | ICD-10-CM

## 2019-05-09 DIAGNOSIS — G8929 Other chronic pain: Secondary | ICD-10-CM | POA: Diagnosis not present

## 2019-05-09 DIAGNOSIS — R278 Other lack of coordination: Secondary | ICD-10-CM | POA: Diagnosis not present

## 2019-05-09 DIAGNOSIS — R252 Cramp and spasm: Secondary | ICD-10-CM

## 2019-05-09 DIAGNOSIS — M546 Pain in thoracic spine: Secondary | ICD-10-CM | POA: Diagnosis not present

## 2019-05-09 DIAGNOSIS — M5441 Lumbago with sciatica, right side: Secondary | ICD-10-CM | POA: Diagnosis not present

## 2019-05-09 NOTE — Therapy (Signed)
[facility] [address] [place], [place], [address] Phone: [phone]   Fax:  [phone]  Physical Therapy Treatment  Patient Details  Name: Jacob Kerr MRN: [id number] Date of Birth: Jun 30, 1951 Referring Provider (PT): Dr. Ronnie Doss   Encounter Date: 05/09/2019  PT End of Session - 05/09/19 1026    Visit Number  5    Date for PT Re-Evaluation  05/24/19    Authorization Type  UHC Medicare    PT Start Time  0945    PT Stop Time  1026    PT Time Calculation (min)  41 min    Activity Tolerance  Patient tolerated treatment well;No increased pain    Behavior During Therapy  WFL for tasks assessed/performed       Past Medical History:  Diagnosis Date  . Acute meniscal tear of left knee   . Allergic rhinitis   . BPH (benign prostatic hypertrophy)   . Cervical spondylosis without myelopathy   . Cervicogenic headache   . Chronic neck pain    uses TENS unit  prn  . Diverticulitis   . GERD (gastroesophageal reflux disease)   . H/O hiatal hernia   . History of diverticulitis   . History of kidney stones   . History of TIA (transient ischemic attack)    04/ 2011---  no residuals  . Hypertension   . Hypothyroidism   . Peripheral neuropathy   . Shingles   . Syrinx of spinal cord ([place])    C4 -- C7    Past Surgical History:  Procedure Laterality Date  . CARDIAC CATHETERIZATION  02-18-2010   dr Angelena Form   normal coronary arteries/  normal lvsf--  ef 65%  . COLONOSCOPY    . [place]  . HERNIA REPAIR     [facility] [place] [facility]  . KNEE ARTHROSCOPY Left 03/13/2014   Procedure: LEFT ARTHROSCOPY KNEE WITH DEBRIDMENT;  Surgeon: Gearlean Alf, MD;  Location: [facility];  Service: Orthopedics;  Laterality: Left;  . LAPAROSCOPIC CHOLECYSTECTOMY  01-30-2003  . NEGATIVE SLEEP STUDY  2013   per pt  . RADIOFREQUENCY ABLATION NERVES  05/2016  . REMOVAL FORGEIN BODY FROM EAR   AGE 68  . RIGHT URETEROSCOPIC LASER LITHOTRIPSY STONE EXTRACTION /  STENT PLACEMENT  05-10-2000  . TRANSTHORACIC ECHOCARDIOGRAM  01-16-2010   normal lvf/  ef  60%/  mild lae    There were no vitals filed for this visit.  Subjective Assessment - 05/09/19 0955    Subjective  I am doing my belly breathing that helps relax me. I am 50% better since initial eval. I have not paid attention to any pain today. After I ejaculate I feel a knot on the right pelvic floor.    Pertinent History  Peripheral neuropathy; headaches; left knee scope; neck pain; TIA.    Patient Stated Goals  reduce pelvic pain, go hunting or hiking without worrying about incontinence, keep up with 40 year old grandson    Currently in Pain?  No/denies    Multiple Pain Sites  No                    Pelvic Floor Special Questions - 05/09/19 0001    Pelvic Floor Internal Exam  Patient confirms identification and approves PT to assess muscles and treatment    Exam Type  Rectal    Strength  good squeeze, good lift, able to hold agaisnt strong resistance  [facility] Adult PT Treatment/Exercise - 05/09/19 0001      Lumbar Exercises: Aerobic   Nustep  6 minutes, level 2 while assessing patient      Lumbar Exercises: Supine   Other Supine Lumbar Exercises  supine with back on foam roll alterante shoulder flexion, bilateral shoulder flexion, alteranate marching       Manual Therapy   Manual Therapy  Internal Pelvic Floor    Internal Pelvic Floor  soft tissue work around the ischial tuberosity, puborectalis, obturator internist in right sidely               PT Short Term Goals - 05/09/19 1004      PT SHORT TERM GOAL #4   Title  pain after ejaculation decreased >/= 25%    Baseline  has not had intercourse lately    Time  4    Period  Weeks    Status  On-going    Target Date  04/26/19        PT Long Term Goals - 05/09/19 1030      PT LONG TERM GOAL #1   Title  Independent with a HEP.    Time   8    Period  Weeks    Status  On-going      PT LONG TERM GOAL #2   Title  able to sit with pain level improved >/= 75% due to ability to relax the pelvic floor    Baseline  50%    Time  8    Period  Weeks    Status  On-going      PT LONG TERM GOAL #3   Title  pain after ejaculation decreased >/= 75%    Time  8    Status  On-going      PT LONG TERM GOAL #4   Title  able to initiate a urine stream and bowel movement without difficulty    Period  Weeks    Status  On-going      PT LONG TERM GOAL #5   Title  able to play with his 88 year old grandson with minimal to no pain due to improve mobilty and decreased in muscle spasms    Time  8    Period  Weeks    Status  On-going            Plan - 05/09/19 0948    Clinical Impression Statement  Patient has 50% less rectal pain and 50%. Patient will release gas 50% less. Patient pelvic floor strength has increased to 4/5. Patient had trigger points in the pelvic floor. Patient reports continued pain with ejaculation and feels a knot on the right side of the pelvic floor. Patient will benefit from skilled therapy to improve coordination, mobility and function.    Personal Factors and Comorbidities  Age;Past/Current Experience;Comorbidity 1;Sex;Comorbidity 2;Comorbidity 3+;Time since onset of injury/illness/exacerbation    Comorbidities  peripheral neuropath, headaches; left knee scope, neck pain, TIA    Examination-Activity Limitations  Sit;Continence    Examination-Participation Restrictions  Community Activity    Stability/Clinical Decision Making  Evolving/Moderate complexity    Rehab Potential  Excellent    PT Frequency  1x / week    PT Duration  8 weeks    PT Treatment/Interventions  ADLs/Self Care Home Management;Biofeedback;Cryotherapy;Electrical Stimulation;Moist Heat;Therapeutic exercise;Therapeutic activities;Neuromuscular re-education;Patient/family education;Dry needling;Passive range of motion;Manual techniques;Spinal  Manipulations    PT Next Visit Plan  soft tissue work to pelvis, abdominal massage to leftlower abdomen, internal  soft tissue work,    PT Home Exercise Plan  Access Code: [email or web address]    Consulted and Agree with Plan of Care  Patient       Patient will benefit from skilled therapeutic intervention in order to improve the following deficits and impairments:  Decreased coordination, Increased fascial restricitons, Decreased endurance, Increased muscle spasms, Decreased activity tolerance, Pain, Hypomobility, Decreased mobility, Decreased strength  Visit Diagnosis: 1. Muscle weakness (generalized)   2. Cramp and spasm   3. Chronic bilateral low back pain with right-sided sciatica   4. Other lack of coordination   5. Pain in thoracic spine        Problem List Patient Active Problem List   Diagnosis Date Noted  . Anxiety 10/04/2017  . Nonallopathic lesion of rib cage 06/15/2017  . Nonallopathic lesion of cervical region 05/18/2017  . Nonallopathic lesion of thoracic region 05/18/2017  . Nonallopathic lesion of lumbosacral region 05/18/2017  . Arrhythmia 10/08/2014  . TIA (transient ischemic attack) 08/09/2014  . Lateral meniscal tear 03/12/2014  . Testosterone deficiency 07/04/2013  . Cervicogenic headache 03/28/2013  . Vitamin D deficiency 01/01/2013  . Hyperlipidemia 01/01/2013  . BPH (benign prostatic hyperplasia) 11/03/2012  . Diverticulosis large intestine w/o perforation or abscess w/bleeding 11/03/2012  . Peripheral neuropathy 11/03/2012  . Hypothyroidism 01/18/2011  . Essential hypertension 01/18/2011  . GERD (gastroesophageal reflux disease) 01/18/2011  . Cervical spondylosis without myelopathy 11/24/2010  . Headache(784.0) 11/24/2010  . Sleep disturbance, unspecified 11/24/2010  . Syringomyelia and syringobulbia ([place]) 11/24/2010  . Disturbance of skin sensation 11/24/2010  . Cerebrovascular disease, unspecified 11/24/2010    Earlie Counts, PT 05/09/19 10:32  AM   [facility] [address] [place], [place], [address] Phone: [phone]   Fax:  [phone]  Name: Jacob Kerr MRN: [id number] Date of Birth: Nov 12, 1950

## 2019-05-12 ENCOUNTER — Other Ambulatory Visit: Payer: Self-pay | Admitting: Family Medicine

## 2019-05-12 DIAGNOSIS — E034 Atrophy of thyroid (acquired): Secondary | ICD-10-CM

## 2019-05-16 ENCOUNTER — Ambulatory Visit: Payer: Medicare Other | Admitting: Physical Therapy

## 2019-05-16 ENCOUNTER — Encounter: Payer: Medicare Other | Admitting: Physical Therapy

## 2019-05-16 ENCOUNTER — Other Ambulatory Visit: Payer: Self-pay

## 2019-05-16 DIAGNOSIS — M546 Pain in thoracic spine: Secondary | ICD-10-CM

## 2019-05-16 DIAGNOSIS — M6281 Muscle weakness (generalized): Secondary | ICD-10-CM

## 2019-05-16 DIAGNOSIS — R252 Cramp and spasm: Secondary | ICD-10-CM

## 2019-05-16 DIAGNOSIS — R278 Other lack of coordination: Secondary | ICD-10-CM

## 2019-05-16 DIAGNOSIS — G8929 Other chronic pain: Secondary | ICD-10-CM

## 2019-05-16 DIAGNOSIS — M5441 Lumbago with sciatica, right side: Secondary | ICD-10-CM | POA: Diagnosis not present

## 2019-05-16 NOTE — Therapy (Signed)
[facility] [address] [place], [place], [address] Phone: [phone]   Fax:  [phone]  Physical Therapy Treatment  Patient Details  Name: Jacob Kerr MRN: [id number] Date of Birth: 1951/01/09 Referring Provider (PT): Dr. Ronnie Doss   Encounter Date: 05/16/2019  PT End of Session - 05/16/19 0957    Visit Number  6    Date for PT Re-Evaluation  05/24/19    Authorization Type  UHC Medicare    PT Start Time  2458   came late   PT Stop Time  1025    PT Time Calculation (min)  30 min    Activity Tolerance  Patient tolerated treatment well;No increased pain    Behavior During Therapy  WFL for tasks assessed/performed       Past Medical History:  Diagnosis Date  . Acute meniscal tear of left knee   . Allergic rhinitis   . BPH (benign prostatic hypertrophy)   . Cervical spondylosis without myelopathy   . Cervicogenic headache   . Chronic neck pain    uses TENS unit  prn  . Diverticulitis   . GERD (gastroesophageal reflux disease)   . H/O hiatal hernia   . History of diverticulitis   . History of kidney stones   . History of TIA (transient ischemic attack)    04/ 2011---  no residuals  . Hypertension   . Hypothyroidism   . Peripheral neuropathy   . Shingles   . Syrinx of spinal cord ([place])    C4 -- C7    Past Surgical History:  Procedure Laterality Date  . CARDIAC CATHETERIZATION  02-18-2010   dr Angelena Form   normal coronary arteries/  normal lvsf--  ef 65%  . COLONOSCOPY    . [place]  . HERNIA REPAIR     [facility] [place] [facility]  . KNEE ARTHROSCOPY Left 03/13/2014   Procedure: LEFT ARTHROSCOPY KNEE WITH DEBRIDMENT;  Surgeon: Gearlean Alf, MD;  Location: [facility];  Service: Orthopedics;  Laterality: Left;  . LAPAROSCOPIC CHOLECYSTECTOMY  01-30-2003  . NEGATIVE SLEEP STUDY  2013   per pt  . RADIOFREQUENCY ABLATION NERVES  05/2016  . REMOVAL FORGEIN  BODY FROM EAR  AGE 13  . RIGHT URETEROSCOPIC LASER LITHOTRIPSY STONE EXTRACTION /  STENT PLACEMENT  05-10-2000  . TRANSTHORACIC ECHOCARDIOGRAM  01-16-2010   normal lvf/  ef  60%/  mild lae    There were no vitals filed for this visit.  Subjective Assessment - 05/16/19 0959    Subjective  The pain starts from the thoracic down to the thoracic are.My testicles have been hiding. The past 2 days have been worse.    Pertinent History  Peripheral neuropathy; headaches; left knee scope; neck pain; TIA.    Patient Stated Goals  reduce pelvic pain, go hunting or hiking without worrying about incontinence, keep up with 49 year old grandson    Currently in Pain?  Yes    Pain Score  7     Pain Location  Back   to the groin   Pain Orientation  Right    Pain Descriptors / Indicators  Spasm    Pain Type  Chronic pain    Pain Onset  More than a month ago    Pain Frequency  Constant    Aggravating Factors   movement, being upright    Pain Relieving Factors  hot water, heating pad, TENS unit    Multiple  Pain Sites  No                       [facility] Adult PT Treatment/Exercise - 05/16/19 0001      Lumbar Exercises: Aerobic   Nustep  6 minutes, level 2 while assessing patient      Manual Therapy   Manual Therapy  Joint mobilization;Soft tissue mobilization    Joint Mobilization  P-A and rotational mobilization to T5-L3; bil. rib mobilzation posteriorly    Soft tissue mobilization  thoracic and lumbar paraspinals  using an assistive device               PT Short Term Goals - 05/09/19 1004      PT SHORT TERM GOAL #4   Title  pain after ejaculation decreased >/= 25%    Baseline  has not had intercourse lately    Time  4    Period  Weeks    Status  On-going    Target Date  04/26/19        PT Long Term Goals - 05/16/19 1003      PT LONG TERM GOAL #1   Title  Independent with a HEP.    Time  8    Period  Weeks    Status  On-going      PT LONG TERM GOAL #2   Title   able to sit with pain level improved >/= 75% due to ability to relax the pelvic floor    Baseline  50%    Time  8    Period  Weeks    Status  On-going      PT LONG TERM GOAL #3   Title  pain after ejaculation decreased >/= 75%    Baseline  100% better    Time  8    Period  Weeks    Status  Achieved      PT LONG TERM GOAL #4   Title  able to initiate a urine stream and bowel movement without difficulty    Baseline  when having increased back pain    Time  8    Period  Weeks    Status  On-going      PT LONG TERM GOAL #5   Title  able to play with his 88 year old grandson with minimal to no pain due to improve mobilty and decreased in muscle spasms    Time  8    Period  Weeks    Status  On-going            Plan - 05/16/19 0959    Clinical Impression Statement  Patient reports no pain with ejaculation but if he has the thoracic pain it increases it. Patient is not having pain with bowel movement. Patient is able to sit with 50% less pain. Patient has tightness in the midthoracic and right rib cage causing pain into the right groin. Patient will benefit from skilled therpay to improve coordination, mobility and function.    Personal Factors and Comorbidities  Age;Past/Current Experience;Comorbidity 1;Sex;Comorbidity 2;Comorbidity 3+;Time since onset of injury/illness/exacerbation    Comorbidities  peripheral neuropath, headaches; left knee scope, neck pain, TIA    Examination-Activity Limitations  Sit;Continence    Examination-Participation Restrictions  Community Activity    Stability/Clinical Decision Making  Evolving/Moderate complexity    Rehab Potential  Excellent    PT Frequency  1x / week    PT Duration  8 weeks  PT Treatment/Interventions  ADLs/Self Care Home Management;Biofeedback;Cryotherapy;Electrical Stimulation;Moist Heat;Therapeutic exercise;Therapeutic activities;Neuromuscular re-education;Patient/family education;Dry needling;Passive range of motion;Manual  techniques;Spinal Manipulations    PT Next Visit Plan  soft tissue work to pelvis, abdominal massage to leftlower abdomen, internal soft tissue work, renewal or d/c    PT Home Exercise Plan  Access Code: [email or web address] and Agree with Plan of Care  Patient       Patient will benefit from skilled therapeutic intervention in order to improve the following deficits and impairments:  Decreased coordination, Increased fascial restricitons, Decreased endurance, Increased muscle spasms, Decreased activity tolerance, Pain, Hypomobility, Decreased mobility, Decreased strength  Visit Diagnosis: 1. Muscle weakness (generalized)   2. Cramp and spasm   3. Chronic bilateral low back pain with right-sided sciatica   4. Other lack of coordination   5. Pain in thoracic spine        Problem List Patient Active Problem List   Diagnosis Date Noted  . Anxiety 10/04/2017  . Nonallopathic lesion of rib cage 06/15/2017  . Nonallopathic lesion of cervical region 05/18/2017  . Nonallopathic lesion of thoracic region 05/18/2017  . Nonallopathic lesion of lumbosacral region 05/18/2017  . Arrhythmia 10/08/2014  . TIA (transient ischemic attack) 08/09/2014  . Lateral meniscal tear 03/12/2014  . Testosterone deficiency 07/04/2013  . Cervicogenic headache 03/28/2013  . Vitamin D deficiency 01/01/2013  . Hyperlipidemia 01/01/2013  . BPH (benign prostatic hyperplasia) 11/03/2012  . Diverticulosis large intestine w/o perforation or abscess w/bleeding 11/03/2012  . Peripheral neuropathy 11/03/2012  . Hypothyroidism 01/18/2011  . Essential hypertension 01/18/2011  . GERD (gastroesophageal reflux disease) 01/18/2011  . Cervical spondylosis without myelopathy 11/24/2010  . Headache(784.0) 11/24/2010  . Sleep disturbance, unspecified 11/24/2010  . Syringomyelia and syringobulbia ([place]) 11/24/2010  . Disturbance of skin sensation 11/24/2010  . Cerebrovascular disease, unspecified 11/24/2010    Earlie Counts, PT 05/16/19 10:32 AM   [facility] [address], [place], [place], [address] Phone: [phone]   Fax:  [phone]  Name: ARVINE CLAYBURN MRN: [id number] Date of Birth: 02-01-1951

## 2019-05-23 ENCOUNTER — Ambulatory Visit: Payer: Medicare Other | Admitting: Physical Therapy

## 2019-05-23 ENCOUNTER — Encounter: Payer: Medicare Other | Admitting: Physical Therapy

## 2019-05-23 ENCOUNTER — Encounter: Payer: Self-pay | Admitting: Physical Therapy

## 2019-05-23 ENCOUNTER — Other Ambulatory Visit: Payer: Self-pay

## 2019-05-23 DIAGNOSIS — R252 Cramp and spasm: Secondary | ICD-10-CM | POA: Diagnosis not present

## 2019-05-23 DIAGNOSIS — G8929 Other chronic pain: Secondary | ICD-10-CM | POA: Diagnosis not present

## 2019-05-23 DIAGNOSIS — R278 Other lack of coordination: Secondary | ICD-10-CM

## 2019-05-23 DIAGNOSIS — M5441 Lumbago with sciatica, right side: Secondary | ICD-10-CM | POA: Diagnosis not present

## 2019-05-23 DIAGNOSIS — M6281 Muscle weakness (generalized): Secondary | ICD-10-CM | POA: Diagnosis not present

## 2019-05-23 DIAGNOSIS — M546 Pain in thoracic spine: Secondary | ICD-10-CM

## 2019-05-23 NOTE — Therapy (Signed)
[facility] [address] [place], [place], [address] Phone: [phone]   Fax:  [phone]  Physical Therapy Treatment  Patient Details  Name: Jacob Kerr MRN: [id number] Date of Birth: 10/21/1950 Referring Provider (PT): Dr. Ronnie Doss   Encounter Date: 05/23/2019  PT End of Session - 05/23/19 0953    Visit Number  7    Date for PT Re-Evaluation  07/18/19    Authorization Type  UHC Medicare    PT Start Time  0945    PT Stop Time  1025    PT Time Calculation (min)  40 min    Activity Tolerance  Patient tolerated treatment well;No increased pain    Behavior During Therapy  WFL for tasks assessed/performed       Past Medical History:  Diagnosis Date  . Acute meniscal tear of left knee   . Allergic rhinitis   . BPH (benign prostatic hypertrophy)   . Cervical spondylosis without myelopathy   . Cervicogenic headache   . Chronic neck pain    uses TENS unit  prn  . Diverticulitis   . GERD (gastroesophageal reflux disease)   . H/O hiatal hernia   . History of diverticulitis   . History of kidney stones   . History of TIA (transient ischemic attack)    04/ 2011---  no residuals  . Hypertension   . Hypothyroidism   . Peripheral neuropathy   . Shingles   . Syrinx of spinal cord ([place])    C4 -- C7    Past Surgical History:  Procedure Laterality Date  . CARDIAC CATHETERIZATION  02-18-2010   dr Angelena Form   normal coronary arteries/  normal lvsf--  ef 65%  . COLONOSCOPY    . [place]  . HERNIA REPAIR     [facility] [place] [facility]  . KNEE ARTHROSCOPY Left 03/13/2014   Procedure: LEFT ARTHROSCOPY KNEE WITH DEBRIDMENT;  Surgeon: Gearlean Alf, MD;  Location: [facility];  Service: Orthopedics;  Laterality: Left;  . LAPAROSCOPIC CHOLECYSTECTOMY  01-30-2003  . NEGATIVE SLEEP STUDY  2013   per pt  . RADIOFREQUENCY ABLATION NERVES  05/2016  . REMOVAL FORGEIN BODY FROM EAR   AGE 68  . RIGHT URETEROSCOPIC LASER LITHOTRIPSY STONE EXTRACTION /  STENT PLACEMENT  05-10-2000  . TRANSTHORACIC ECHOCARDIOGRAM  01-16-2010   normal lvf/  ef  60%/  mild lae    There were no vitals filed for this visit.  Subjective Assessment - 05/23/19 0950    Subjective  I had a big hemorroid coming out of my anus today. My right thoracic area is hurting alot today and increased the pelvic pain.    Pertinent History  Peripheral neuropathy; headaches; left knee scope; neck pain; TIA.    Patient Stated Goals  reduce pelvic pain, go hunting or hiking without worrying about incontinence, keep up with 68 year old grandson    Currently in Pain?  Yes    Pain Score  8     Pain Location  Back   to the groin   Pain Orientation  Right    Pain Descriptors / Indicators  Spasm    Pain Type  Chronic pain    Pain Onset  More than a month ago    Pain Frequency  Constant    Aggravating Factors   movement, beng upright    Pain Relieving Factors  hot water, heating pad TENS unit    Multiple Pain  Sites  No    Pain Score  6    Pain Location  Rectum    Pain Orientation  Mid    Pain Descriptors / Indicators  --   vibration   Pain Type  Chronic pain    Pain Onset  More than a month ago    Pain Frequency  Intermittent    Aggravating Factors   after intercourse, sitting    Pain Relieving Factors  being completely still, hot water         [facility] PT Assessment - 05/23/19 0001      Assessment   Medical Diagnosis  R10.2 Pelvic Pain    Referring Provider (PT)  Dr. Ronnie Doss    Onset Date/Surgical Date  --   2015   Prior Therapy  for back      Precautions   Precautions  None      Restrictions   Weight Bearing Restrictions  No      Home Environment   Living Environment  Private residence      Prior Function   Level of Independence  Independent    Leisure  stretches, walking, up and down tractor      Cognition   Overall Cognitive Status  Within Functional Limits for tasks assessed       Posture/Postural Control   Posture/Postural Control  Postural limitations    Postural Limitations  Rounded Shoulders;Forward head    Posture Comments  constant motion in sitting      AROM   Lumbar Flexion  full but needs help to go back to neutral    Lumbar - Right Side Bend  full but went backwards and regained his posture    Lumbar - Left Side Bend  decreased by 25%    Lumbar - Right Rotation  decreased by 25%    Lumbar - Left Rotation  decreased by 25%      Strength   Right Hip ABduction  4/5    Left Hip Flexion  4/5    Left Hip ABduction  4/5                   [facility] Adult PT Treatment/Exercise - 05/23/19 0001      Manual Therapy   Manual Therapy  Soft tissue mobilization;Joint mobilization    Joint Mobilization  P-A and rotational mobilization to T5-L3; bil. rib mobilzation posteriorly    Soft tissue mobilization  using addadday to work on thoracic lumbar paraspinals; between the right lower rib cage on the intercoastals, right quadratus, right gluteal, right piriformis, along the greater trochanter,; Manual soft tissue work to the diaphragm;using the addaday around the anus to relax the pelvic floor muscles               PT Short Term Goals - 05/09/19 1004      PT SHORT TERM GOAL #4   Title  pain after ejaculation decreased >/= 25%    Baseline  has not had intercourse lately    Time  4    Period  Weeks    Status  On-going    Target Date  04/26/19        PT Long Term Goals - 05/16/19 1003      PT LONG TERM GOAL #1   Title  Independent with a HEP.    Time  8    Period  Weeks    Status  On-going      PT LONG TERM GOAL #2  Title  able to sit with pain level improved >/= 75% due to ability to relax the pelvic floor    Baseline  50%    Time  8    Period  Weeks    Status  On-going      PT LONG TERM GOAL #3   Title  pain after ejaculation decreased >/= 75%    Baseline  100% better    Time  8    Period  Weeks    Status  Achieved       PT LONG TERM GOAL #4   Title  able to initiate a urine stream and bowel movement without difficulty    Baseline  when having increased back pain    Time  8    Period  Weeks    Status  On-going      PT LONG TERM GOAL #5   Title  able to play with his 68 year old grandson with minimal to no pain due to improve mobilty and decreased in muscle spasms    Time  8    Period  Weeks    Status  On-going            Plan - 05/23/19 1028    Clinical Impression Statement  Patient has a flare-up today that is a pain level 6/10 and after the manual work is 1/10. Patient has limited lumbar ROM and almost fell when he extended his lumbar spine. Patient almost had two fecal incontinence accidents due to the pain. Patient had no pain with ejaculation prior to this flare-up. Patient was able to sit with less movement but with flare-up has to move constantly. Patient pelvic floor was not assessed due to having a large hemorrod. Patient will benefit from skilled therapy to improve coordination, mobility and function.    Personal Factors and Comorbidities  Age;Past/Current Experience;Comorbidity 1;Sex;Comorbidity 2;Comorbidity 3+;Time since onset of injury/illness/exacerbation    Comorbidities  peripheral neuropath, headaches; left knee scope, neck pain, TIA    Examination-Activity Limitations  Sit;Continence    Examination-Participation Restrictions  Community Activity    Stability/Clinical Decision Making  Evolving/Moderate complexity    Rehab Potential  Excellent    PT Frequency  1x / week    PT Duration  8 weeks    PT Treatment/Interventions  ADLs/Self Care Home Management;Biofeedback;Cryotherapy;Electrical Stimulation;Moist Heat;Therapeutic exercise;Therapeutic activities;Neuromuscular re-education;Patient/family education;Dry needling;Passive range of motion;Manual techniques;Spinal Manipulations    PT Next Visit Plan  soft tissue work to pelvis, abdominal massage to leftlower abdomen, internal soft  tissue work, see how he is doing with recent flare-up    PT Home Exercise Plan  Access Code: [email or web address]    Consulted and Agree with Plan of Care  Patient       Patient will benefit from skilled therapeutic intervention in order to improve the following deficits and impairments:  Decreased coordination, Increased fascial restricitons, Decreased endurance, Increased muscle spasms, Decreased activity tolerance, Pain, Hypomobility, Decreased mobility, Decreased strength  Visit Diagnosis: Muscle weakness (generalized) - Plan: PT plan of care cert/re-cert  Cramp and spasm - Plan: PT plan of care cert/re-cert  Chronic bilateral low back pain with right-sided sciatica - Plan: PT plan of care cert/re-cert  Other lack of coordination - Plan: PT plan of care cert/re-cert  Pain in thoracic spine - Plan: PT plan of care cert/re-cert     Problem List Patient Active Problem List   Diagnosis Date Noted  . Anxiety 10/04/2017  . Nonallopathic lesion of rib cage 06/15/2017  .  Nonallopathic lesion of cervical region 05/18/2017  . Nonallopathic lesion of thoracic region 05/18/2017  . Nonallopathic lesion of lumbosacral region 05/18/2017  . Arrhythmia 10/08/2014  . TIA (transient ischemic attack) 08/09/2014  . Lateral meniscal tear 03/12/2014  . Testosterone deficiency 07/04/2013  . Cervicogenic headache 03/28/2013  . Vitamin D deficiency 01/01/2013  . Hyperlipidemia 01/01/2013  . BPH (benign prostatic hyperplasia) 11/03/2012  . Diverticulosis large intestine w/o perforation or abscess w/bleeding 11/03/2012  . Peripheral neuropathy 11/03/2012  . Hypothyroidism 01/18/2011  . Essential hypertension 01/18/2011  . GERD (gastroesophageal reflux disease) 01/18/2011  . Cervical spondylosis without myelopathy 11/24/2010  . Headache(784.0) 11/24/2010  . Sleep disturbance, unspecified 11/24/2010  . Syringomyelia and syringobulbia ([place]) 11/24/2010  . Disturbance of skin sensation 11/24/2010  .  Cerebrovascular disease, unspecified 11/24/2010    Jacob Kerr, PT 05/23/19 10:36 AM    [facility] [address] [place], [place], [address] Phone: [phone]   Fax:  [phone]  Name: Jacob Kerr MRN: [id number] Date of Birth: 06/03/1951

## 2019-05-24 ENCOUNTER — Encounter: Payer: Self-pay | Admitting: Physician Assistant

## 2019-05-30 ENCOUNTER — Ambulatory Visit: Payer: Medicare Other | Attending: Family Medicine | Admitting: Physical Therapy

## 2019-05-30 ENCOUNTER — Encounter: Payer: Self-pay | Admitting: Physical Therapy

## 2019-05-30 ENCOUNTER — Other Ambulatory Visit: Payer: Self-pay

## 2019-05-30 DIAGNOSIS — R278 Other lack of coordination: Secondary | ICD-10-CM | POA: Diagnosis present

## 2019-05-30 DIAGNOSIS — M5441 Lumbago with sciatica, right side: Secondary | ICD-10-CM | POA: Diagnosis present

## 2019-05-30 DIAGNOSIS — M6281 Muscle weakness (generalized): Secondary | ICD-10-CM | POA: Diagnosis present

## 2019-05-30 DIAGNOSIS — R252 Cramp and spasm: Secondary | ICD-10-CM

## 2019-05-30 DIAGNOSIS — M546 Pain in thoracic spine: Secondary | ICD-10-CM

## 2019-05-30 DIAGNOSIS — G8929 Other chronic pain: Secondary | ICD-10-CM | POA: Diagnosis present

## 2019-05-30 NOTE — Patient Instructions (Signed)
Access Code: [facility]  URL: [email or web address]  Date: 05/30/2019  Prepared by: Earlie Counts   Exercises Supine Butterfly Groin Stretch - 1 reps - 1 sets - 1 min hold - 1x daily - 7x weekly Supine Figure 4 Piriformis Stretch - 1 reps - 1 sets - 30 sec hold - 1x daily - 7x weekly Supine Hamstring Stretch - 2 reps - 1 sets - 30 sec hold - 1x daily - 7x weekly Supine Lower Trunk Rotation - 2 reps - 1 sets - 30 sec hold - 1x daily - 7x weekly Cat-Camel - 10 reps - 1 sets - 1x daily - 7x weekly Seated Pelvic Floor Contraction - 10 reps - 1 sets - 10 sec hold - 3x daily - 7x weekly Patient Education Trigger [facility] [address], [place], [place] [address] Phone # [phone] Fax [phone]

## 2019-05-30 NOTE — Therapy (Signed)
[facility] [address] [place], [place], [address] Phone: [phone]   Fax:  [phone]  Physical Therapy Treatment  Patient Details  Name: Jacob Kerr MRN: [id number] Date of Birth: 05-Aug-1951 Referring Provider (PT): Dr. Ronnie Doss   Encounter Date: 05/30/2019  PT End of Session - 05/30/19 0859    Visit Number  8    Date for PT Re-Evaluation  07/18/19    Authorization Type  UHC Medicare    PT Start Time  0815    PT Stop Time  0858    PT Time Calculation (min)  43 min    Activity Tolerance  Patient tolerated treatment well;No increased pain    Behavior During Therapy  WFL for tasks assessed/performed       Past Medical History:  Diagnosis Date  . Acute meniscal tear of left knee   . Allergic rhinitis   . BPH (benign prostatic hypertrophy)   . Cervical spondylosis without myelopathy   . Cervicogenic headache   . Chronic neck pain    uses TENS unit  prn  . Diverticulitis   . GERD (gastroesophageal reflux disease)   . H/O hiatal hernia   . History of diverticulitis   . History of kidney stones   . History of TIA (transient ischemic attack)    04/ 2011---  no residuals  . Hypertension   . Hypothyroidism   . Peripheral neuropathy   . Shingles   . Syrinx of spinal cord ([place])    C4 -- C7    Past Surgical History:  Procedure Laterality Date  . CARDIAC CATHETERIZATION  02-18-2010   dr Angelena Form   normal coronary arteries/  normal lvsf--  ef 65%  . COLONOSCOPY    . [place]  . HERNIA REPAIR     [facility] [place] [facility]  . KNEE ARTHROSCOPY Left 03/13/2014   Procedure: LEFT ARTHROSCOPY KNEE WITH DEBRIDMENT;  Surgeon: Gearlean Alf, MD;  Location: [facility];  Service: Orthopedics;  Laterality: Left;  . LAPAROSCOPIC CHOLECYSTECTOMY  01-30-2003  . NEGATIVE SLEEP STUDY  2013   per pt  . RADIOFREQUENCY ABLATION NERVES  05/2016  . REMOVAL FORGEIN BODY FROM EAR   AGE 68  . RIGHT URETEROSCOPIC LASER LITHOTRIPSY STONE EXTRACTION /  STENT PLACEMENT  05-10-2000  . TRANSTHORACIC ECHOCARDIOGRAM  01-16-2010   normal lvf/  ef  60%/  mild lae    There were no vitals filed for this visit.  Subjective Assessment - 05/30/19 0817    Subjective  I had some rough days with holding back gas and close calls getting to the toilet. When I get pain in the lumbar area I get a feeling I have to run to the bathroom.    Pertinent History  Peripheral neuropathy; headaches; left knee scope; neck pain; TIA.    Patient Stated Goals  reduce pelvic pain, go hunting or hiking without worrying about incontinence, keep up with 15 year old grandson    Currently in Pain?  Yes    Pain Score  7     Pain Location  Back    Pain Orientation  Lower;Right    Pain Descriptors / Indicators  Spasm    Pain Type  Chronic pain    Pain Onset  More than a month ago    Pain Frequency  Constant    Aggravating Factors   deep breath and bend over, movement, being upright    Pain Relieving Factors  hot water, heating pad, TENS unit    Multiple Pain Sites  Yes    Pain Score  4    Pain Location  Rectum    Pain Orientation  Mid    Pain Descriptors / Indicators  --   vibration   Pain Type  Chronic pain    Pain Onset  More than a month ago    Pain Frequency  Intermittent    Aggravating Factors   after intercourse, sitting    Pain Relieving Factors  being completely still, hot water                       [facility] Adult PT Treatment/Exercise - 05/30/19 0001      Lumbar Exercises: Stretches   Lower Trunk Rotation  2 reps;30 seconds    Hip Flexor Stretch  Right;Left;1 rep;30 seconds    Quadruped Mid Back Stretch  3 reps;30 seconds      Manual Therapy   Manual Therapy  Soft tissue mobilization;Joint mobilization    Soft tissue mobilization  using addaday to massage the thoracic and lumbar multifidi; along the right ischiobulbocavernosus and coccygeus and levator ani       Trigger  Point Dry Needling - 05/30/19 0001    Consent Given?  Yes    Education Handout Provided  --   previosuly given   Muscles Treated Back/Hip  Lumbar multifidi    Other Dry Needling  T5-T12    Lumbar multifidi Response  Twitch response elicited;Palpable increased muscle length           PT Education - 05/30/19 0855    Education Details  [email or web address]    Person(s) Educated  Patient    Methods  Explanation;Demonstration;Verbal cues;Handout    Comprehension  Verbalized understanding;Returned demonstration       PT Short Term Goals - 05/30/19 [id number]      PT SHORT TERM GOAL #4   Title  pain after ejaculation decreased >/= 25%    Baseline  has not had intercourse lately    Time  4    Period  Weeks    Status  On-going    Target Date  04/26/19        PT Long Term Goals - 05/30/19 [id number]      PT LONG TERM GOAL #1   Title  Independent with a HEP.    Time  8    Period  Weeks    Status  On-going      PT LONG TERM GOAL #2   Title  able to sit with pain level improved >/= 75% due to ability to relax the pelvic floor    Baseline  50%    Time  8    Period  Weeks    Status  On-going      PT LONG TERM GOAL #3   Title  pain after ejaculation decreased >/= 75%    Baseline  100% better    Time  8    Period  Weeks    Status  Achieved      PT LONG TERM GOAL #4   Title  able to initiate a urine stream and bowel movement without difficulty    Baseline  when having increased back pain; 40% better if drinks his water    Time  8    Period  Weeks    Status  On-going      PT LONG TERM GOAL #5   Title  able  to play with his 28 year old grandson with minimal to no pain due to improve mobilty and decreased in muscle spasms    Time  8    Period  Weeks    Status  On-going            Plan - 05/30/19 0825    Clinical Impression Statement  Patient pain is 50% better in the rectum. Patient back pain after manual work decreased to 3/10. Patient understands how to massage his pelvic floor  muscles when he is having pain. Patient is now contracting pelvic floor 10 seconds. Patient has less pain in the right lower quadrant. Patient will benefit from skilled therapy to improve coordinaiton , mobility, and function.    Personal Factors and Comorbidities  Age;Past/Current Experience;Comorbidity 1;Sex;Comorbidity 2;Comorbidity 3+;Time since onset of injury/illness/exacerbation    Comorbidities  peripheral neuropath, headaches; left knee scope, neck pain, TIA    Examination-Activity Limitations  Sit;Continence    Examination-Participation Restrictions  Community Activity    Stability/Clinical Decision Making  Evolving/Moderate complexity    Rehab Potential  Excellent    PT Frequency  1x / week    PT Duration  8 weeks    PT Treatment/Interventions  ADLs/Self Care Home Management;Biofeedback;Cryotherapy;Electrical Stimulation;Moist Heat;Therapeutic exercise;Therapeutic activities;Neuromuscular re-education;Patient/family education;Dry needling;Passive range of motion;Manual techniques;Spinal Manipulations    PT Next Visit Plan  pelvic floor contaction, soft tissue work to perineum and back    PT Home Exercise Plan  Access Code: [email or web address]    Consulted and Agree with Plan of Care  Patient       Patient will benefit from skilled therapeutic intervention in order to improve the following deficits and impairments:  Decreased coordination, Increased fascial restricitons, Decreased endurance, Increased muscle spasms, Decreased activity tolerance, Pain, Hypomobility, Decreased mobility, Decreased strength  Visit Diagnosis: Muscle weakness (generalized)  Cramp and spasm  Chronic bilateral low back pain with right-sided sciatica  Other lack of coordination  Pain in thoracic spine     Problem List Patient Active Problem List   Diagnosis Date Noted  . Anxiety 10/04/2017  . Nonallopathic lesion of rib cage 06/15/2017  . Nonallopathic lesion of cervical region 05/18/2017  . Nonallopathic  lesion of thoracic region 05/18/2017  . Nonallopathic lesion of lumbosacral region 05/18/2017  . Arrhythmia 10/08/2014  . TIA (transient ischemic attack) 08/09/2014  . Lateral meniscal tear 03/12/2014  . Testosterone deficiency 07/04/2013  . Cervicogenic headache 03/28/2013  . Vitamin D deficiency 01/01/2013  . Hyperlipidemia 01/01/2013  . BPH (benign prostatic hyperplasia) 11/03/2012  . Diverticulosis large intestine w/o perforation or abscess w/bleeding 11/03/2012  . Peripheral neuropathy 11/03/2012  . Hypothyroidism 01/18/2011  . Essential hypertension 01/18/2011  . GERD (gastroesophageal reflux disease) 01/18/2011  . Cervical spondylosis without myelopathy 11/24/2010  . Headache(784.0) 11/24/2010  . Sleep disturbance, unspecified 11/24/2010  . Syringomyelia and syringobulbia ([place]) 11/24/2010  . Disturbance of skin sensation 11/24/2010  . Cerebrovascular disease, unspecified 11/24/2010    Jacob Kerr, PT 05/30/19 9:02 AM    [facility] [address] [place], [place], [address] Phone: [phone]   Fax:  [phone]  Name: Jacob Kerr MRN: [id number] Date of Birth: [birth year]

## 2019-06-05 ENCOUNTER — Encounter: Payer: Medicare Other | Admitting: Physical Therapy

## 2019-06-06 ENCOUNTER — Ambulatory Visit: Payer: Medicare Other | Admitting: Physical Therapy

## 2019-06-06 ENCOUNTER — Encounter: Payer: Self-pay | Admitting: Physical Therapy

## 2019-06-06 ENCOUNTER — Ambulatory Visit (INDEPENDENT_AMBULATORY_CARE_PROVIDER_SITE_OTHER): Payer: Medicare Other | Admitting: Physician Assistant

## 2019-06-06 ENCOUNTER — Other Ambulatory Visit: Payer: Self-pay

## 2019-06-06 ENCOUNTER — Encounter: Payer: Self-pay | Admitting: Physician Assistant

## 2019-06-06 VITALS — BP 124/72 | HR 80 | Temp 98.4°F | Ht 70.75 in | Wt 203.2 lb

## 2019-06-06 DIAGNOSIS — M6281 Muscle weakness (generalized): Secondary | ICD-10-CM

## 2019-06-06 DIAGNOSIS — M5441 Lumbago with sciatica, right side: Secondary | ICD-10-CM

## 2019-06-06 DIAGNOSIS — R278 Other lack of coordination: Secondary | ICD-10-CM

## 2019-06-06 DIAGNOSIS — R252 Cramp and spasm: Secondary | ICD-10-CM

## 2019-06-06 DIAGNOSIS — K641 Second degree hemorrhoids: Secondary | ICD-10-CM

## 2019-06-06 DIAGNOSIS — K5900 Constipation, unspecified: Secondary | ICD-10-CM | POA: Diagnosis not present

## 2019-06-06 DIAGNOSIS — G8929 Other chronic pain: Secondary | ICD-10-CM

## 2019-06-06 DIAGNOSIS — M546 Pain in thoracic spine: Secondary | ICD-10-CM

## 2019-06-06 MED ORDER — HYDROCORTISONE ACETATE 25 MG RE SUPP: 25.0000 mg | Freq: Every day | RECTAL | 4 refills | Status: DC

## 2019-06-06 NOTE — Therapy (Signed)
[facility] [address], [place], [place], [address] Phone: [phone]   Fax:  [phone]  Physical Therapy Treatment  Patient Details  Name: Jacob Kerr MRN: [id number] Date of Birth: 06/26/1951 Referring Provider (PT): Dr. Ronnie Doss   Encounter Date: 06/06/2019  PT End of Session - 06/06/19 1339    Visit Number  9    Date for PT Re-Evaluation  07/18/19    Authorization Type  [employer] Medicare    PT Start Time  1330    PT Stop Time  1410    PT Time Calculation (min)  40 min    Activity Tolerance  Patient tolerated treatment well;No increased pain    Behavior During Therapy  WFL for tasks assessed/performed       Past Medical History:  Diagnosis Date  . Acute meniscal tear of left knee   . Allergic rhinitis   . BPH (benign prostatic hypertrophy)   . Cervical spondylosis without myelopathy   . Cervicogenic headache   . Chronic neck pain    uses TENS unit  prn  . Diverticulitis   . GERD (gastroesophageal reflux disease)   . H/O hiatal hernia   . History of diverticulitis   . History of kidney stones   . History of TIA (transient ischemic attack)    04/ 2011---  no residuals  . Hypertension   . Hypothyroidism   . Peripheral neuropathy   . Shingles   . Syrinx of spinal cord ([place])    C4 -- C7    Past Surgical History:  Procedure Laterality Date  . CARDIAC CATHETERIZATION  02-18-2010   dr Angelena Form   normal coronary arteries/  normal lvsf--  ef 65%  . COLONOSCOPY    . [place]  . HERNIA REPAIR     [facility] [place]  . KNEE ARTHROSCOPY Left 03/13/2014   Procedure: LEFT ARTHROSCOPY KNEE WITH DEBRIDMENT;  Surgeon: Gearlean Alf, MD;  Location: [facility]  Service: Orthopedics;  Laterality: Left;  . LAPAROSCOPIC CHOLECYSTECTOMY  01-30-2003  . NEGATIVE SLEEP STUDY  2013   per pt  . RADIOFREQUENCY ABLATION NERVES  05/2016  . REMOVAL FORGEIN BODY FROM EAR   AGE 68  . RIGHT URETEROSCOPIC LASER LITHOTRIPSY STONE EXTRACTION /  STENT PLACEMENT  05-10-2000  . TRANSTHORACIC ECHOCARDIOGRAM  01-16-2010   normal lvf/  ef  60%/  mild lae    There were no vitals filed for this visit.  Subjective Assessment - 06/06/19 1333    Subjective  I fell yesterday due to losing my balance. I was looking up at an airplane when I lost my balance. I have exercises for my balance from the vestibular therapist but I have stopped doing them. I have been doing my pelvic floor exercises. I am able to hold the urine better. If I feel like I have to urinate I am able to take more time to go to the bathroom.    Patient is accompained by:  Interpreter    Pertinent History  Peripheral neuropathy; headaches; left knee scope; neck pain; TIA.    Patient Stated Goals  reduce pelvic pain, go hunting or hiking without worrying about incontinence, keep up with 26 year old grandson    Currently in Pain?  Yes    Pain Score  8     Pain Location  Back    Pain Orientation  Lower;Right;Mid    Pain Descriptors / Indicators  Dull  Pain Type  Chronic pain    Pain Onset  More than a month ago    Pain Frequency  Constant    Aggravating Factors   movement    Pain Relieving Factors  hot water, heating pad, TENS unit    Multiple Pain Sites  No                       [facility] Adult PT Treatment/Exercise - 06/06/19 0001      Lumbar Exercises: Aerobic   Nustep  7 minutes; level 2; while assessing patient      Lumbar Exercises: Supine   Bridge with clamshell  20 reps    Bridge with March  20 reps;1 second    Bridge with [employer] Limitations  VC to go slowly      Lumbar Exercises: Prone   Opposite Arm/Leg Raise  Right arm/Left leg;Left arm/Right leg;10 reps   each side     Knee/Hip Exercises: Sidelying   Hip ADduction  Strengthening;Right;Left;1 set;20 reps   with pelvic floor contraction   Clams  20 each with green theraband with VC to contract the pelvic floor       Manual Therapy   Manual Therapy  Soft tissue mobilization    Soft tissue mobilization  using the addaday to massage and elongate the lumbar and thoracic paraspinals in prone               PT Short Term Goals - 06/06/19 1343      PT SHORT TERM GOAL #4   Title  pain after ejaculation decreased >/= 25%    Baseline  has not had intercourse lately    Time  4    Period  Weeks    Status  Achieved    Target Date  04/26/19        PT Long Term Goals - 06/06/19 1343      PT LONG TERM GOAL #1   Title  Independent with a HEP.    Time  8    Period  Weeks    Status  On-going      PT LONG TERM GOAL #2   Title  able to sit with pain level improved >/= 75% due to ability to relax the pelvic floor    Baseline  50%    Time  8    Period  Weeks    Status  On-going      PT LONG TERM GOAL #3   Title  pain after ejaculation decreased >/= 75%    Baseline  100% better    Time  8    Period  Weeks    Status  Achieved      PT LONG TERM GOAL #4   Title  able to initiate a urine stream and bowel movement without difficulty    Baseline  when having increased back pain; 40% better if drinks his water    Time  8    Period  Weeks    Status  On-going      PT LONG TERM GOAL #5   Title  able to play with his 68 year old grandson with minimal to no pain due to improve mobilty and decreased in muscle spasms    Time  8    Period  Weeks    Status  On-going            Plan - 06/06/19 1332    Clinical Impression Statement  Patient  fell while looking up at an airplane and hurt his knee and wrist. Patient is able to hold his urine to get to the bathroom. Patient had to go to the bathroom behind the truck due to not able to get to the bathroom in time when he has the urge.Patient has to continuously move due to pain. Patient performed cores strength that incorporates the pelvic floor. Patient will benefit from skilled therapy to improve coordination, mobility, and function.    Personal Factors  and Comorbidities  Age;Past/Current Experience;Comorbidity 1;Sex;Comorbidity 2;Comorbidity 3+;Time since onset of injury/illness/exacerbation    Comorbidities  peripheral neuropath, headaches; left knee scope, neck pain, TIA    Examination-Activity Limitations  Sit;Continence    Examination-Participation Restrictions  Community Activity    Stability/Clinical Decision Making  Evolving/Moderate complexity    Rehab Potential  Excellent    PT Frequency  1x / week    PT Duration  8 weeks    PT Treatment/Interventions  ADLs/Self Care Home Management;Biofeedback;Cryotherapy;Electrical Stimulation;Moist Heat;Therapeutic exercise;Therapeutic activities;Neuromuscular re-education;Patient/family education;Dry needling;Passive range of motion;Manual techniques;Spinal Manipulations    PT Next Visit Plan  pelvic floor contaction, soft tissue work to perineum and back; write 10th visit; progress HEP    PT Home Exercise Plan  Access Code: [email or web address]    Consulted and Agree with Plan of Care  Patient       Patient will benefit from skilled therapeutic intervention in order to improve the following deficits and impairments:  Decreased coordination, Increased fascial restricitons, Decreased endurance, Increased muscle spasms, Decreased activity tolerance, Pain, Hypomobility, Decreased mobility, Decreased strength  Visit Diagnosis: Muscle weakness (generalized)  Cramp and spasm  Chronic bilateral low back pain with right-sided sciatica  Other lack of coordination  Pain in thoracic spine     Problem List Patient Active Problem List   Diagnosis Date Noted  . Anxiety 10/04/2017  . Nonallopathic lesion of rib cage 06/15/2017  . Nonallopathic lesion of cervical region 05/18/2017  . Nonallopathic lesion of thoracic region 05/18/2017  . Nonallopathic lesion of lumbosacral region 05/18/2017  . Arrhythmia 10/08/2014  . TIA (transient ischemic attack) 08/09/2014  . Lateral meniscal tear 03/12/2014  .  Testosterone deficiency 07/04/2013  . Cervicogenic headache 03/28/2013  . Vitamin D deficiency 01/01/2013  . Hyperlipidemia 01/01/2013  . BPH (benign prostatic hyperplasia) 11/03/2012  . Diverticulosis large intestine w/o perforation or abscess w/bleeding 11/03/2012  . Peripheral neuropathy 11/03/2012  . Hypothyroidism 01/18/2011  . Essential hypertension 01/18/2011  . GERD (gastroesophageal reflux disease) 01/18/2011  . Cervical spondylosis without myelopathy 11/24/2010  . Headache(784.0) 11/24/2010  . Sleep disturbance, unspecified 11/24/2010  . Syringomyelia and syringobulbia ([place]) 11/24/2010  . Disturbance of skin sensation 11/24/2010  . Cerebrovascular disease, unspecified 11/24/2010    Earlie Counts, PT 06/06/19 2:11 PM     [facility] [address] [place], [place], [address] Phone: [phone]   Fax:  [phone]  Name: AITAN BECKMANN MRN: [id number] Date of Birth: August 15, 1951

## 2019-06-06 NOTE — Progress Notes (Signed)
Subjective:    Patient ID: Jacob Kerr, male    DOB: Dec 18, 1950, 68 y.o.   MRN: [id number]  HPI [name] is a pleasant 68 year old white male, known previously to Dr. Arlyce Dice who comes in today with complaints of hemorrhoids. Patient had colonoscopy in April 2016 which showed severe diverticulosis of the left colon and sigmoid and mild diverticulosis of the transverse colon, internal hemorrhoids were noted, no polyps.  He also had flexible sigmoidoscopy in May 2017 again noted medium sized internal hemorrhoids and diverticulosis. Patient has history of hypertension, TIAs, BPH, and  Syringomyelia. He has had chronic problems with back pain and lower extremity weakness and decreased sensation. He says his current symptoms started 3 to 4 weeks ago.  He has had some issues with alternating diarrhea and constipation thinks he had been constipated at the time and had some straining.  He is also had occasional issues with partial incontinence over the past couple of years.  He noticed a swollen uncomfortable hemorrhoid protruding from the anus, which he says was the size of an "earlobe".  He started using some hydrocortisone cream that he had at home and gradually discomfort has improved.  He had a little bit of bleeding at onset but has not had any recently.  He feels that the hemorrhoids will prolapse and spontaneously recede. He says he had had a banding procedure done by Dr. Arlyce Dice in the past, and then very remotely had also had surgical management of what sounds like external hemorrhoids by Dr. Kendrick Ranch. He relates that the hemorrhoids have been bothering him off and on for quite a while and that he would like to have definitive management.  Review of Systems Pertinent positive and negative review of systems were noted in the above HPI section.  All other review of systems was otherwise negative.  Outpatient Encounter Medications as of 06/06/2019  Medication Sig  . Alpha-Lipoic Acid 600 MG CAPS  Take by mouth.  Marland Kitchen amLODipine (NORVASC) 10 MG tablet TAKE 1 TABLET BY MOUTH  DAILY  . aspirin 81 MG tablet Take 81 mg by mouth at bedtime.   Marland Kitchen azelastine (ASTELIN) 0.1 % nasal spray PLACE 2 SPRAYS INTO BOTH NOSTRILS 2 (TWO) TIMES DAILY.  . baclofen (LIORESAL) 10 MG tablet Take 1 tablet (10 mg total) by mouth 2 (two) times daily.  . Coenzyme Q10 (CO Q 10) 100 MG CAPS Take 1 capsule by mouth daily.  Marland Kitchen levothyroxine (SYNTHROID) 150 MCG tablet TAKE 1 TABLET (150 MCG TOTAL) BY MOUTH DAILY BEFORE BREAKFAST.  Marland Kitchen LORazepam (ATIVAN) 0.5 MG tablet Take 1 tablet (0.5 mg total) by mouth 2 (two) times daily as needed for anxiety. (Patient taking differently: Take 0.5 mg by mouth as needed for anxiety. )  . losartan (COZAAR) 50 MG tablet Take 1 tablet (50 mg total) by mouth daily.  . NON FORMULARY Take 3 capsules by mouth every morning. Nugenix Testosterone Booster / [employer]   . omeprazole (PRILOSEC) 20 MG capsule Take 1 capsule (20 mg total) by mouth 2 (two) times daily.  Marland Kitchen UNABLE TO FIND Med Name: CBD oil  . venlafaxine XR (EFFEXOR-XR) 75 MG 24 hr capsule TAKE 1 CAPSULE BY MOUTH  DAILY WITH BREAKFAST  . hydrocortisone (ANUSOL-HC) 25 MG suppository Place 1 suppository (25 mg total) rectally at bedtime. Use for two weeks then repeat as needed.  . [DISCONTINUED] atorvastatin (LIPITOR) 20 MG tablet Take 1 tablet (20 mg total) by mouth daily.   No facility-administered encounter medications on file  as of 06/06/2019.    Allergies  Allergen Reactions  . Cymbalta [Duloxetine Hcl] Other (See Comments)    Depression  . Lyrica [Pregabalin] Other (See Comments)    Paranoid  . Neurontin [Gabapentin] Other (See Comments)    nightmares  . Robaxin [Methocarbamol] Other (See Comments)    Seeing "bright lights"  . Sudafed [Pseudoephedrine Hcl] Other (See Comments)    Increased heart rate  . Epinephrine Palpitations   Patient Active Problem List   Diagnosis Date Noted  . Anxiety 10/04/2017  . Nonallopathic lesion of rib  cage 06/15/2017  . Nonallopathic lesion of cervical region 05/18/2017  . Nonallopathic lesion of thoracic region 05/18/2017  . Nonallopathic lesion of lumbosacral region 05/18/2017  . Arrhythmia 10/08/2014  . TIA (transient ischemic attack) 08/09/2014  . Lateral meniscal tear 03/12/2014  . Testosterone deficiency 07/04/2013  . Cervicogenic headache 03/28/2013  . Vitamin D deficiency 01/01/2013  . Hyperlipidemia 01/01/2013  . BPH (benign prostatic hyperplasia) 11/03/2012  . Diverticulosis large intestine w/o perforation or abscess w/bleeding 11/03/2012  . Peripheral neuropathy 11/03/2012  . Hypothyroidism 01/18/2011  . Essential hypertension 01/18/2011  . GERD (gastroesophageal reflux disease) 01/18/2011  . Cervical spondylosis without myelopathy 11/24/2010  . Headache(784.0) 11/24/2010  . Sleep disturbance, unspecified 11/24/2010  . Syringomyelia and syringobulbia ([facility]) 11/24/2010  . Disturbance of skin sensation 11/24/2010  . Cerebrovascular disease, unspecified 11/24/2010   Social History   Socioeconomic History  . Marital status: Married    Spouse name: Not on file  . Number of children: 2  . Years of education: 63  . Highest education level: Not on file  Occupational History  . Occupation: Retired    Associate Professor: [employer]  Social Needs  . Financial resource strain: Not hard at all  . Food insecurity    Worry: Never true    Inability: Never true  . Transportation needs    Medical: No    Non-medical: No  Tobacco Use  . Smoking status: Never Smoker  . Smokeless tobacco: Never Used  Substance and Sexual Activity  . Alcohol use: Yes    Alcohol/week: 7.0 standard drinks    Types: 7 Shots of liquor per week    Comment: 2 at night  . Drug use: No  . Sexual activity: Not on file  Lifestyle  . Physical activity    Days per week: 6 days    Minutes per session: 30 min  . Stress: Only a little  Relationships  . Social connections    Talks on phone: More than three  times a week    Gets together: More than three times a week    Attends religious service: Never    Active member of club or organization: No    Attends meetings of clubs or organizations: Never    Relationship status: Married  . Intimate partner violence    Fear of current or ex partner: No    Emotionally abused: No    Physically abused: No    Forced sexual activity: No  Other Topics Concern  . Not on file  Social History Narrative   Lives at home with wife.       Patient is right handed.   Patient drinks 1-2 cups of caffeine daily.    Mr. Horney family history includes COPD in an other family member; Cancer in his sister and sister; Colon polyps in his brother and sister; Heart disease in his father; Hyperlipidemia in his sister and sister; Nephrolithiasis in his sister; Prostate  cancer in his father; Stroke in his father.      Objective:    Vitals:   06/06/19 1529  BP: 124/72  Pulse: 80  Temp: 98.4 F (36.9 C)    Physical Exam Well-developed well-nourished older white male in no acute distress, pleasant , Weight 203, BMI 28.5  HEENT; nontraumatic normocephalic, EOMI, PER R LA, sclera anicteric. Oropharynx; not examined/wearing mask/COVID Neck; supple, no JVD Cardiovascular; regular rate and rhythm with S1-S2, no murmur rub or gallop Pulmonary; Clear bilaterally Abdomen; soft, nontender, nondistended, no palpable mass or hepatosplenomegaly, bowel sounds are active Rectal; no external hemorrhoids noted.  He has least 2 internal hemorrhoids obvious at the anal verge, not overtly prolapsed at present- these are edematous but not thrombosed Skin; benign exam, no jaundice rash or appreciable lesions Extremities; no clubbing cyanosis or edema skin warm and dry Neuro/Psych; alert and oriented x4, grossly nonfocal mood and affect appropriate       Assessment & Plan:   #19  68 year old male with symptomatic grade 2 internal hemorrhoids with recent exacerbation with anal  discomfort, swelling ,prolapse, and low-grade bleeding.  #2 diverticulosis 3.  Colon cancer surveillance-up-to-date with negative colonoscopy April 2016 other than diverticulosis #4 hypertension 5.  History of TIAs on baby aspirin 6.  Syringomyelia-with chronic back pain, weakness  7.  Chronic GERD  Plan; Start Anusol [facility] suppositories 1 per rectum nightly x2 weeks and repeat course as needed. Start Benefiber 1 scoop daily and large glass of water Patient will be scheduled for in office hemorrhoidal banding with Dr. Lavon Paganini..  Procedure was discussed in detail with the patient, and he wishes to proceed.     Renia Mikelson S Alanah Sakuma PA-C 06/06/2019   Cc: Raliegh Ip, DO

## 2019-06-06 NOTE — Patient Instructions (Addendum)
If you are age 68 or older, your body mass index should be between 23-30. Your Body mass index is 28.55 kg/m. If this is out of the aforementioned range listed, please consider follow up with your Primary Care Provider.  If you are age 20 or younger, your body mass index should be between 19-25. Your Body mass index is 28.55 kg/m. If this is out of the aformentioned range listed, please consider follow up with your Primary Care Provider.   We have sent the following medications to your pharmacy for you to pick up at your convenience: Anusol suppositories  Start Benefiber daily.  You have been scheduled for a hemorrhoid banding with Dr Silverio Decamp on 06/25/19 at 3:30 pm  Thank you for choosing me and [facility].   Amy Esterwood, PA-C

## 2019-06-12 ENCOUNTER — Other Ambulatory Visit: Payer: Self-pay

## 2019-06-12 ENCOUNTER — Ambulatory Visit: Payer: Medicare Other | Admitting: Physical Therapy

## 2019-06-12 ENCOUNTER — Encounter: Payer: Self-pay | Admitting: Physical Therapy

## 2019-06-12 ENCOUNTER — Encounter: Payer: Self-pay | Admitting: Family Medicine

## 2019-06-12 DIAGNOSIS — M5441 Lumbago with sciatica, right side: Secondary | ICD-10-CM

## 2019-06-12 DIAGNOSIS — R278 Other lack of coordination: Secondary | ICD-10-CM

## 2019-06-12 DIAGNOSIS — M6281 Muscle weakness (generalized): Secondary | ICD-10-CM | POA: Diagnosis not present

## 2019-06-12 DIAGNOSIS — R252 Cramp and spasm: Secondary | ICD-10-CM

## 2019-06-12 DIAGNOSIS — G8929 Other chronic pain: Secondary | ICD-10-CM

## 2019-06-12 DIAGNOSIS — M546 Pain in thoracic spine: Secondary | ICD-10-CM

## 2019-06-12 NOTE — Therapy (Signed)
[facility] [address] [place], [place], [address] Phone: [phone]   Fax:  [phone]  Physical Therapy Treatment  Patient Details  Name: Jacob Kerr MRN: [id number] Date of Birth: 1951-06-19 Referring Provider (PT): Dr. Ronnie Doss Progress Note Reporting Period 03/29/2019 to 06/12/2019  See note below for Objective Data and Assessment of Progress/Goals.       Encounter Date: 06/12/2019  PT End of Session - 06/12/19 0851    Visit Number  10    Date for PT Re-Evaluation  07/18/19    Authorization Type  [employer] Medicare    PT Start Time  0845    PT Stop Time  0923    PT Time Calculation (min)  38 min    Activity Tolerance  Patient tolerated treatment well;No increased pain    Behavior During Therapy  WFL for tasks assessed/performed       Past Medical History:  Diagnosis Date  . Acute meniscal tear of left knee   . Allergic rhinitis   . Anal fissure   . Anxiety   . BPH (benign prostatic hypertrophy)   . Cervical spondylosis without myelopathy   . Cervicogenic headache   . Chronic headaches   . Chronic neck pain    uses TENS unit  prn  . Diverticulitis   . Gallstones   . GERD (gastroesophageal reflux disease)   . H/O hiatal hernia   . History of diverticulitis   . History of kidney stones   . History of TIA (transient ischemic attack)    04/ 2011---  no residuals  . Hypertension   . Hypothyroidism   . Peripheral neuropathy   . Shingles   . Syrinx of spinal cord ([place])    C4 -- C7    Past Surgical History:  Procedure Laterality Date  . CARDIAC CATHETERIZATION  02-18-2010   dr Angelena Form   normal coronary arteries/  normal lvsf--  ef 65%  . COLONOSCOPY    . [place]  . HERNIA REPAIR     [facility]  . KNEE ARTHROSCOPY Left 03/13/2014   Procedure: LEFT ARTHROSCOPY KNEE WITH DEBRIDMENT;  Surgeon: Gearlean Alf, MD;  Location: [facility];   Service: Orthopedics;  Laterality: Left;  . LAPAROSCOPIC CHOLECYSTECTOMY  01-30-2003  . NEGATIVE SLEEP STUDY  2013   per pt  . RADIOFREQUENCY ABLATION NERVES  05/2016  . REMOVAL FORGEIN BODY FROM EAR  AGE 68  . RIGHT URETEROSCOPIC LASER LITHOTRIPSY STONE EXTRACTION /  STENT PLACEMENT  05-10-2000  . TRANSTHORACIC ECHOCARDIOGRAM  01-16-2010   normal lvf/  ef  60%/  mild lae    There were no vitals filed for this visit.  Subjective Assessment - 06/12/19 0853    Subjective  no intercourse in 2 weeks. I had a bad day yesterday. Pain in the back made it difficult to move. I am able to contract the anus to assist with gas. I need to be close to the toilet. Patient is able to hold the urine to get to the commode.NO rectal pain. I will have hemorroids removed on 06/25/2019.    Pertinent History  Peripheral neuropathy; headaches; left knee scope; neck pain; TIA.    Patient Stated Goals  reduce pelvic pain, go hunting or hiking without worrying about incontinence, keep up with 51 year old grandson    Currently in Pain?  Yes    Pain Score  7     Pain Location  Back    Pain Orientation  Lower;Right;Mid    Pain Descriptors / Indicators  Shooting;Stabbing;Discomfort    Pain Type  Chronic pain    Pain Onset  More than a month ago    Pain Frequency  Constant    Aggravating Factors   movement    Pain Relieving Factors  hot water, heating pain, TENS unit    Multiple Pain Sites  No         [facility] PT Assessment - 06/12/19 0001      Assessment   Medical Diagnosis  R10.2 Pelvic Pain    Referring Provider (PT)  Dr. Ronnie Doss    Onset Date/Surgical Date  --   2015   Prior Therapy  for back      Precautions   Precautions  None      Restrictions   Weight Bearing Restrictions  No      Home Environment   Living Environment  Private residence      Prior Function   Level of Independence  Independent    Leisure  stretches, walking, up and down tractor      Cognition   Overall Cognitive  Status  Within Functional Limits for tasks assessed      Posture/Postural Control   Posture/Postural Control  Postural limitations    Postural Limitations  Rounded Shoulders;Forward head    Posture Comments  constant motion in sitting      AROM   Lumbar Flexion  full but needs help to go back to neutral    Lumbar - Right Side Bend  full but went backwards and regained his posture    Lumbar - Left Side Bend  decreased by 25%    Lumbar - Right Rotation  decreased by 25%    Lumbar - Left Rotation  decreased by 25%      Strength   Right Hip ABduction  4/5    Left Hip Flexion  4/5    Left Hip ABduction  4/5                Pelvic Floor Special Questions - 06/12/19 0001    Urinary Leakage  No    Fecal incontinence  Yes   3 times per week when he does not get to the commode in time   Strength  good squeeze, good lift, able to hold agaisnt strong resistance        [facility] Adult PT Treatment/Exercise - 06/12/19 0001      Lumbar Exercises: Aerobic   Nustep  9 minutes; level 2; while assessing patient      Lumbar Exercises: Supine   Bent Knee Raise  20 reps;1 second   with abdominal bracing   Bridge with clamshell  20 reps    Bridge with [employer] Limitations  red band with abdominal bracing    Bridge with March  20 reps;1 second    Bridge with [employer] Limitations  VC to go slowly    Straight Leg Raise  15 reps    Straight Leg Raises Limitations  tactile cues to brace abdominals      Manual Therapy   Manual Therapy  Soft tissue mobilization    Soft tissue mobilization  using the addaday to massage and elongate the lumbar and thoracic paraspinals and the gluteals in prone               PT Short Term Goals - 06/06/19 1343      PT SHORT TERM GOAL #  4   Title  pain after ejaculation decreased >/= 25%    Baseline  has not had intercourse lately    Time  4    Period  Weeks    Status  Achieved    Target Date  04/26/19        PT Long Term Goals -  06/12/19 0900      PT LONG TERM GOAL #1   Title  Independent with a HEP.    Time  8    Period  Weeks    Status  On-going      PT LONG TERM GOAL #2   Title  able to sit with pain level improved >/= 75% due to ability to relax the pelvic floor    Baseline  50%; no pain for yesterday and today for rectal pain    Time  8    Period  Weeks    Status  On-going      PT LONG TERM GOAL #3   Title  pain after ejaculation decreased >/= 75%    Time  8    Period  Weeks    Status  Achieved      PT LONG TERM GOAL #4   Title  able to initiate a urine stream and bowel movement without difficulty    Baseline  40% better    Time  8    Period  Weeks    Status  On-going      PT LONG TERM GOAL #5   Title  able to play with his 65 year old grandson with minimal to no pain due to improve mobilty and decreased in muscle spasms    Baseline  difficulty due to back pain    Time  8    Period  Weeks    Status  On-going            Plan - 06/12/19 [id number]    Clinical Impression Statement  After therapy patients pain is 75% better. Patient reports his initiating of urine stream is 40% better. Patient has not had rectal pain for 2 days. Patient needs verbal cues to brace abdominals with core exercises. Patient has had bad back pain days lately. Patient is able to hold his urine to get to the bathroom in time. Patient will leak stool 3 times per week when he does not get to the commode in time. Patient will benefit from skilled therapy to improve coordination, mobility, function.    Personal Factors and Comorbidities  Age;Past/Current Experience;Comorbidity 1;Sex;Comorbidity 2;Comorbidity 3+;Time since onset of injury/illness/exacerbation    Comorbidities  peripheral neuropath, headaches; left knee scope, neck pain, TIA    Examination-Activity Limitations  Sit;Continence    Examination-Participation Restrictions  Community Activity    Stability/Clinical Decision Making  Evolving/Moderate complexity    Rehab  Potential  Excellent    PT Frequency  1x / week    PT Duration  8 weeks    PT Treatment/Interventions  ADLs/Self Care Home Management;Biofeedback;Cryotherapy;Electrical Stimulation;Moist Heat;Therapeutic exercise;Therapeutic activities;Neuromuscular re-education;Patient/family education;Dry needling;Passive range of motion;Manual techniques;Spinal Manipulations    PT Next Visit Plan  pelvic floor contaction, soft tissue work to perineum and back;  progress HEP    PT Home Exercise Plan  Access Code: [email or web address]    Consulted and Agree with Plan of Care  Patient       Patient will benefit from skilled therapeutic intervention in order to improve the following deficits and impairments:  Decreased coordination, Increased fascial restricitons, Decreased endurance, Increased  muscle spasms, Decreased activity tolerance, Pain, Hypomobility, Decreased mobility, Decreased strength  Visit Diagnosis: Muscle weakness (generalized)  Cramp and spasm  Chronic bilateral low back pain with right-sided sciatica  Other lack of coordination  Pain in thoracic spine     Problem List Patient Active Problem List   Diagnosis Date Noted  . Anxiety 10/04/2017  . Nonallopathic lesion of rib cage 06/15/2017  . Nonallopathic lesion of cervical region 05/18/2017  . Nonallopathic lesion of thoracic region 05/18/2017  . Nonallopathic lesion of lumbosacral region 05/18/2017  . Arrhythmia 10/08/2014  . TIA (transient ischemic attack) 08/09/2014  . Lateral meniscal tear 03/12/2014  . Testosterone deficiency 07/04/2013  . Cervicogenic headache 03/28/2013  . Vitamin D deficiency 01/01/2013  . Hyperlipidemia 01/01/2013  . BPH (benign prostatic hyperplasia) 11/03/2012  . Diverticulosis large intestine w/o perforation or abscess w/bleeding 11/03/2012  . Peripheral neuropathy 11/03/2012  . Hypothyroidism 01/18/2011  . Essential hypertension 01/18/2011  . GERD (gastroesophageal reflux disease) 01/18/2011  . Cervical  spondylosis without myelopathy 11/24/2010  . Headache(784.0) 11/24/2010  . Sleep disturbance, unspecified 11/24/2010  . Syringomyelia and syringobulbia ([place]) 11/24/2010  . Disturbance of skin sensation 11/24/2010  . Cerebrovascular disease, unspecified 11/24/2010    Earlie Counts, PT 06/12/19 9:32 AM   [facility] [address] [place], [place], [address] Phone: [phone]   Fax:  [phone]  Name: Jacob Kerr MRN: [id number] Date of Birth: Jan 02, 1951

## 2019-06-14 ENCOUNTER — Ambulatory Visit (INDEPENDENT_AMBULATORY_CARE_PROVIDER_SITE_OTHER): Payer: Medicare Other | Admitting: Family

## 2019-06-14 ENCOUNTER — Ambulatory Visit (INDEPENDENT_AMBULATORY_CARE_PROVIDER_SITE_OTHER): Payer: Medicare Other

## 2019-06-14 ENCOUNTER — Encounter: Payer: Self-pay | Admitting: Family

## 2019-06-14 ENCOUNTER — Other Ambulatory Visit: Payer: Self-pay

## 2019-06-14 VITALS — BP 139/88 | HR 76 | Temp 98.9°F | Resp 20 | Ht 70.75 in | Wt 203.0 lb

## 2019-06-14 DIAGNOSIS — R159 Full incontinence of feces: Secondary | ICD-10-CM | POA: Diagnosis not present

## 2019-06-14 DIAGNOSIS — M542 Cervicalgia: Secondary | ICD-10-CM | POA: Diagnosis not present

## 2019-06-14 DIAGNOSIS — G8929 Other chronic pain: Secondary | ICD-10-CM | POA: Diagnosis not present

## 2019-06-14 DIAGNOSIS — M546 Pain in thoracic spine: Secondary | ICD-10-CM

## 2019-06-14 NOTE — Patient Instructions (Signed)

## 2019-06-14 NOTE — Progress Notes (Signed)
Subjective:    Patient ID: Jacob Kerr, male    DOB: 1951/05/03, 68 y.o.   MRN: [id number]  Chief Complaint  Patient presents with  . pressure at base of skull   Pt presents to the office today with neck pain and thoracic pain that started in 2015 after a fall from a hill. He states this pain comes and goes, but over the last 5 months he has noticed strange symptoms such tingling in his testes, stool incontinence. He reports on 4 occasions of having stool incontinence. He states when he gets the urge to have a BM if he doesn't go now then he will have an accident.   He states he has been working with PT for the last 8 weeks with pelvic floor strengthening and using dry needing that helps slightly, but his symptoms continue to get worse.  Neck Pain  This is a chronic problem. The current episode started more than 1 year ago. The problem occurs intermittently. The problem has been waxing and waning. The pain is associated with a remote injury. The pain is present in the occipital region. The quality of the pain is described as burning. The pain is at a severity of 3/10. The pain is moderate. The symptoms are aggravated by twisting. Associated symptoms include headaches, leg pain, numbness and tingling. Pertinent negatives include no pain with swallowing, photophobia or weakness. He has tried bed rest, ice and NSAIDs for the symptoms. The treatment provided mild relief.  Back Pain This is a chronic problem. The current episode started more than 1 year ago. The problem occurs constantly. The problem has been waxing and waning since onset. The pain is present in the thoracic spine. The quality of the pain is described as aching. The pain is at a severity of 8/10. The pain is moderate. The pain is worse during the night. The symptoms are aggravated by standing and twisting. Associated symptoms include bowel incontinence, headaches, leg pain, numbness, perianal numbness and tingling. Pertinent  negatives include no dysuria or weakness. He has tried muscle relaxant and bed rest for the symptoms. The treatment provided mild relief.      Review of Systems  Eyes: Negative for photophobia.  Gastrointestinal: Positive for bowel incontinence.  Genitourinary: Negative for dysuria.  Musculoskeletal: Positive for back pain and neck pain.  Neurological: Positive for tingling, numbness and headaches. Negative for weakness.  All other systems reviewed and are negative.      Objective:   Physical Exam Vitals signs reviewed.  Constitutional:      General: He is not in acute distress.    Appearance: He is well-developed.  HENT:     Head: Normocephalic.  Eyes:     General:        Right eye: No discharge.        Left eye: No discharge.     Pupils: Pupils are equal, round, and reactive to light.  Neck:     Musculoskeletal: Normal range of motion and neck supple.     Thyroid: No thyromegaly.  Cardiovascular:     Rate and Rhythm: Normal rate and regular rhythm.     Heart sounds: Normal heart sounds. No murmur.  Pulmonary:     Effort: Pulmonary effort is normal. No respiratory distress.     Breath sounds: Normal breath sounds. No wheezing.  Abdominal:     General: Bowel sounds are normal. There is no distension.     Palpations: Abdomen is soft.  Tenderness: There is no abdominal tenderness.  Musculoskeletal:        General: Tenderness present.     Comments: Thoracic pain with sitting, flexion, and extension  Skin:    General: Skin is warm and dry.     Findings: No erythema or rash.  Neurological:     Mental Status: He is alert and oriented to person, place, and time.     Cranial Nerves: No cranial nerve deficit.     Deep Tendon Reflexes: Reflexes are normal and symmetric.  Psychiatric:        Behavior: Behavior normal.        Thought Content: Thought content normal.        Judgment: Judgment normal.       BP 139/88   Pulse 76   Temp 98.9 F (37.2 C) (Temporal)    Resp 20   Ht 5' 10.75" (1.797 m)   Wt 203 lb (92.1 kg)   SpO2 99%   BMI 28.51 kg/m      Assessment & Plan:  Jacob Kerr comes in today with chief complaint of pressure at base of skull   Diagnosis and orders addressed:  1. Chronic bilateral thoracic back pain - MR Thoracic Spine Wo Contrast; Future - DG Thoracic Spine 2 View; Future - Ambulatory referral to Neurosurgery  2. Neck pain - MR Thoracic Spine Wo Contrast; Future - DG Thoracic Spine 2 View; Future - Ambulatory referral to Neurosurgery  3. Incontinence of feces, unspecified fecal incontinence type - MR Thoracic Spine Wo Contrast; Future - DG Thoracic Spine 2 View; Future - Ambulatory referral to Neurosurgery  Will do stat referral for MRI and referral to [place] working with PT If symptoms worsen go to ED   Evelina Dun, FNP

## 2019-06-18 ENCOUNTER — Other Ambulatory Visit: Payer: Self-pay

## 2019-06-18 ENCOUNTER — Ambulatory Visit (HOSPITAL_COMMUNITY)
Admission: RE | Admit: 2019-06-18 | Discharge: 2019-06-18 | Disposition: A | Discharge status: Home / Self Care | Payer: Medicare Other | Source: Ambulatory Visit | Attending: Family | Admitting: Family

## 2019-06-18 DIAGNOSIS — R159 Full incontinence of feces: Secondary | ICD-10-CM

## 2019-06-18 DIAGNOSIS — G8929 Other chronic pain: Secondary | ICD-10-CM | POA: Diagnosis present

## 2019-06-18 DIAGNOSIS — M546 Pain in thoracic spine: Secondary | ICD-10-CM | POA: Insufficient documentation

## 2019-06-18 DIAGNOSIS — M542 Cervicalgia: Secondary | ICD-10-CM

## 2019-06-19 ENCOUNTER — Ambulatory Visit: Payer: Medicare Other | Admitting: Physical Therapy

## 2019-06-19 ENCOUNTER — Encounter: Payer: Self-pay | Admitting: Physical Therapy

## 2019-06-19 DIAGNOSIS — R278 Other lack of coordination: Secondary | ICD-10-CM

## 2019-06-19 DIAGNOSIS — M5441 Lumbago with sciatica, right side: Secondary | ICD-10-CM

## 2019-06-19 DIAGNOSIS — M6281 Muscle weakness (generalized): Secondary | ICD-10-CM

## 2019-06-19 DIAGNOSIS — G8929 Other chronic pain: Secondary | ICD-10-CM

## 2019-06-19 DIAGNOSIS — R252 Cramp and spasm: Secondary | ICD-10-CM

## 2019-06-19 DIAGNOSIS — M546 Pain in thoracic spine: Secondary | ICD-10-CM

## 2019-06-19 NOTE — Patient Instructions (Addendum)
Relaxation Exercises with the Urge to Void   When you experience an urge to void:  FIRST  Stop and stand very still    Sit down if you can    Don't move    You need to stay very still to maintain control  SECOND Squeeze your pelvic floor muscles 5 times, like a quick flick, to keep from leaking  THIRD Relax  Take a deep breath and then let it out  Try to make the urge go away by using relaxation and visualization techniques  FINALLY When you feel the urge go away somewhat, walk normally to the bathroom.   If the urge gets suddenly stronger on the way, you may stop again and relax to regain control.  [facility] [address], [address] [place], [place] [address] Phone # [phone] Fax [phone]  

## 2019-06-19 NOTE — Therapy (Signed)
[facility] [address] [place], [place], [address] Phone: [phone]   Fax:  [phone]  Physical Therapy Treatment  Patient Details  Name: Jacob Kerr MRN: [id number] Date of Birth: 1950-10-01 Referring Provider (PT): Dr. Ronnie Doss   Encounter Date: 06/19/2019  PT End of Session - 06/19/19 0905    Visit Number  11    Date for PT Re-Evaluation  07/18/19    Authorization Type  UHC Medicare    PT Start Time  0800    PT Stop Time  [id number]    PT Time Calculation (min)  55 min    Activity Tolerance  Patient tolerated treatment well;No increased pain    Behavior During Therapy  WFL for tasks assessed/performed       Past Medical History:  Diagnosis Date  . Acute meniscal tear of left knee   . Allergic rhinitis   . Anal fissure   . Anxiety   . BPH (benign prostatic hypertrophy)   . Cervical spondylosis without myelopathy   . Cervicogenic headache   . Chronic headaches   . Chronic neck pain    uses TENS unit  prn  . Diverticulitis   . Gallstones   . GERD (gastroesophageal reflux disease)   . H/O hiatal hernia   . History of diverticulitis   . History of kidney stones   . History of TIA (transient ischemic attack)    04/ 2011---  no residuals  . Hypertension   . Hypothyroidism   . Peripheral neuropathy   . Shingles   . Syrinx of spinal cord ([place])    C4 -- C7    Past Surgical History:  Procedure Laterality Date  . CARDIAC CATHETERIZATION  02-18-2010   dr Angelena Form   normal coronary arteries/  normal lvsf--  ef 65%  . COLONOSCOPY    . [employer]  . HERNIA REPAIR     [facility]  . KNEE ARTHROSCOPY Left 03/13/2014   Procedure: LEFT ARTHROSCOPY KNEE WITH DEBRIDMENT;  Surgeon: Gearlean Alf, MD;  Location: [facility];  Service: Orthopedics;  Laterality: Left;  . LAPAROSCOPIC CHOLECYSTECTOMY  01-30-2003  . NEGATIVE SLEEP STUDY  2013   per pt  .  RADIOFREQUENCY ABLATION NERVES  05/2016  . REMOVAL FORGEIN BODY FROM EAR  AGE 26  . RIGHT URETEROSCOPIC LASER LITHOTRIPSY STONE EXTRACTION /  STENT PLACEMENT  05-10-2000  . TRANSTHORACIC ECHOCARDIOGRAM  01-16-2010   normal lvf/  ef  60%/  mild lae    There were no vitals filed for this visit.  Subjective Assessment - 06/19/19 0807    Subjective  I had a MRI and seeing surgeon Friday. I had two quick trips to the toilet due to having the urge. When I have the urge I have to get to the bathroom. 9/28 has a hemorroid procedure.Patient has not had the nail driven pain in his feet.    Pertinent History  Peripheral neuropathy; headaches; left knee scope; neck pain; TIA.    Patient Stated Goals  reduce pelvic pain, go hunting or hiking without worrying about incontinence, keep up with 65 year old grandson    Currently in Pain?  Yes    Pain Score  6     Pain Location  Back    Pain Orientation  Lower;Right;Mid    Pain Descriptors / Indicators  Shooting;Stabbing;Discomfort    Pain Type  Chronic pain    Pain Onset  More than  a month ago    Pain Frequency  Constant    Aggravating Factors   movement    Pain Relieving Factors  hot water, heating pain, TENS unit    Multiple Pain Sites  No                       [facility] Adult PT Treatment/Exercise - 06/19/19 0001      Self-Care   Self-Care  Other Self-Care Comments    Other Self-Care Comments   instruction on the urge to void with bowel movements to hold and get to the bathroom, education on how the stool goes through the intestines with type 7 to form to Type 4, Patient will have type 7 when his back hurts and other times will be Type 4,       Neuro Re-ed    Neuro Re-ed Details   diaphragmatic breathing, supine lift bil. legs with breathing out and contracting her abdominals, how to progress abdominal exercises while contracting the abdomen and not doming the abdomen      Lumbar Exercises: Aerobic   Nustep  7.5  minutes; level 2;  while assessing patient      Manual Therapy   Manual Therapy  Myofascial release;Soft tissue mobilization    Soft tissue mobilization  circular massage around the abdomen to promote peristalic motion of the large intestines    Myofascial Release  lateral bladder mobility release, suprapubic release, mesenteric root mobility release, lift small intestines off the bladder, mobilization of the sigmoid              PT Education - 06/19/19 0858    Education Details  urge to void; how to move and exercise without bulging of the abdoment    Person(s) Educated  Patient    Methods  Explanation;Demonstration;Handout    Comprehension  Returned demonstration;Verbalized understanding       PT Short Term Goals - 06/06/19 1343      PT SHORT TERM GOAL #4   Title  pain after ejaculation decreased >/= 25%    Baseline  has not had intercourse lately    Time  4    Period  Weeks    Status  Achieved    Target Date  04/26/19        PT Long Term Goals - 06/19/19 0910      PT LONG TERM GOAL #1   Title  Independent with a HEP.    Time  8    Period  Weeks    Status  On-going      PT LONG TERM GOAL #2   Title  able to sit with pain level improved >/= 75% due to ability to relax the pelvic floor    Baseline  has not had rectal pain but back pain    Time  8    Period  Weeks    Status  On-going      PT LONG TERM GOAL #3   Title  pain after ejaculation decreased >/= 75%    Baseline  100% better    Time  8    Period  Weeks    Status  Achieved      PT LONG TERM GOAL #4   Title  able to initiate a urine stream and bowel movement without difficulty    Baseline  40% better, has to pull the scrotum forward sometimes to fully empty    Time  8    Period  Weeks    Status  On-going      PT LONG TERM GOAL #5   Title  able to play with his 48 year old grandson with minimal to no pain due to improve mobilty and decreased in muscle spasms    Baseline  difficulty due to back pain    Time  8     Period  Weeks    Status  On-going            Plan - 06/19/19 0905    Clinical Impression Statement  Patient reports no urinary leakage for while. Patient reports when he has the urge to have a bowel movement he has to rush to the commode or a tree. Patient was able to hold a bowel movement when he was in a restuarant and get in his car to a store. Patient understands the  urge to void. Patient has fascial tightness in the left lower quadrant and suprapubically. Patient needed instruction to contract his abdoment and pelvic floor to not dome his abdomen. Patient had a MRI of his thoracic spine and will see the surgeon this Friday. Patient reports some near falls. Patient will benefit from skilled therapy to improve coordination, mobility, and function.    Personal Factors and Comorbidities  Age;Past/Current Experience;Comorbidity 1;Sex;Comorbidity 2;Comorbidity 3+;Time since onset of injury/illness/exacerbation    Comorbidities  peripheral neuropath, headaches; left knee scope, neck pain, TIA    Examination-Activity Limitations  Sit;Continence    Examination-Participation Restrictions  Community Activity    Rehab Potential  Excellent    PT Frequency  1x / week    PT Duration  8 weeks    PT Treatment/Interventions  ADLs/Self Care Home Management;Biofeedback;Cryotherapy;Electrical Stimulation;Moist Heat;Therapeutic exercise;Therapeutic activities;Neuromuscular re-education;Patient/family education;Dry needling;Passive range of motion;Manual techniques;Spinal Manipulations    PT Next Visit Plan  see what MD says, soft tissue work to left lower quadrant and suprapubically    PT Home Exercise Plan  Access Code: [email or web address]    Consulted and Agree with Plan of Care  Patient       Patient will benefit from skilled therapeutic intervention in order to improve the following deficits and impairments:  Decreased coordination, Increased fascial restricitons, Decreased endurance, Increased muscle spasms,  Decreased activity tolerance, Pain, Hypomobility, Decreased mobility, Decreased strength  Visit Diagnosis: Muscle weakness (generalized)  Cramp and spasm  Chronic bilateral low back pain with right-sided sciatica  Other lack of coordination  Pain in thoracic spine     Problem List Patient Active Problem List   Diagnosis Date Noted  . Anxiety 10/04/2017  . Nonallopathic lesion of rib cage 06/15/2017  . Nonallopathic lesion of cervical region 05/18/2017  . Nonallopathic lesion of thoracic region 05/18/2017  . Nonallopathic lesion of lumbosacral region 05/18/2017  . Arrhythmia 10/08/2014  . TIA (transient ischemic attack) 08/09/2014  . Lateral meniscal tear 03/12/2014  . Testosterone deficiency 07/04/2013  . Cervicogenic headache 03/28/2013  . Vitamin D deficiency 01/01/2013  . Hyperlipidemia 01/01/2013  . BPH (benign prostatic hyperplasia) 11/03/2012  . Diverticulosis large intestine w/o perforation or abscess w/bleeding 11/03/2012  . Peripheral neuropathy 11/03/2012  . Hypothyroidism 01/18/2011  . Essential hypertension 01/18/2011  . GERD (gastroesophageal reflux disease) 01/18/2011  . Cervical spondylosis without myelopathy 11/24/2010  . Headache(784.0) 11/24/2010  . Sleep disturbance, unspecified 11/24/2010  . Syringomyelia and syringobulbia ([place]) 11/24/2010  . Disturbance of skin sensation 11/24/2010  . Cerebrovascular disease, unspecified 11/24/2010    Earlie Counts, PT 06/19/19 9:12 AM   [facility] [address], [place], [place], [address] Phone: [phone]   Fax:  [phone]  Name: Jacob Kerr MRN: [id number] Date of Birth: [birth year]

## 2019-06-22 DIAGNOSIS — M546 Pain in thoracic spine: Secondary | ICD-10-CM | POA: Insufficient documentation

## 2019-06-22 DIAGNOSIS — G5620 Lesion of ulnar nerve, unspecified upper limb: Secondary | ICD-10-CM | POA: Insufficient documentation

## 2019-06-22 DIAGNOSIS — M4120 Other idiopathic scoliosis, site unspecified: Secondary | ICD-10-CM | POA: Insufficient documentation

## 2019-06-25 ENCOUNTER — Other Ambulatory Visit: Payer: Self-pay

## 2019-06-25 ENCOUNTER — Encounter: Payer: Self-pay | Admitting: Gastroenterology

## 2019-06-25 ENCOUNTER — Ambulatory Visit (INDEPENDENT_AMBULATORY_CARE_PROVIDER_SITE_OTHER): Payer: Medicare Other | Admitting: Gastroenterology

## 2019-06-25 VITALS — BP 156/100 | HR 80 | Temp 98.8°F | Ht 70.75 in | Wt 205.5 lb

## 2019-06-25 DIAGNOSIS — K641 Second degree hemorrhoids: Secondary | ICD-10-CM

## 2019-06-25 DIAGNOSIS — K625 Hemorrhage of anus and rectum: Secondary | ICD-10-CM

## 2019-06-25 NOTE — Progress Notes (Signed)
Patient was scheduled with me in error, he had flexible sigmoidoscopy and 2 internal hemorrhoid band ligation by Dr. Loletha Carrow in 2017.  He did not wish to reschedule the appointment and requested hemorrhoidal band ligation during this visit.  PROCEDURE NOTE: The patient presents with symptomatic grade II  hemorrhoids, requesting rubber band ligation of his/her hemorrhoidal disease.  All risks, benefits and alternative forms of therapy were described and informed consent was obtained.  In the Left Lateral Decubitus position anoscopic examination revealed grade II hemorrhoids in the Right posterior and right anterior position(s).  The anorectum was pre-medicated with 0.125% Nitroglycerine and Recticare The decision was made to band the Right posterior internal hemorrhoid, and the [place] was used to perform band ligation without complication.  Digital anorectal examination was then performed to assure proper positioning of the band, and to adjust the banded tissue as required.  The patient was discharged home without pain or other issues.  Dietary and behavioral recommendations were given and along with follow-up instructions.     The following adjunctive treatments were recommended:  Benefiber 1 tablespoon 2-3 times daily with meals  The patient will return in 2-4 weeks for follow-up with Dr.Danis and possible additional banding as required.  No complications were encountered and the patient tolerated the procedure well.

## 2019-06-25 NOTE — Patient Instructions (Addendum)
HEMORRHOID BANDING PROCEDURE    FOLLOW-UP CARE   1. The procedure you have had should have been relatively painless since the banding of the area involved does not have nerve endings and there is no pain sensation.  The rubber band cuts off the blood supply to the hemorrhoid and the band may fall off as soon as 48 hours after the banding (the band may occasionally be seen in the toilet bowl following a bowel movement). You may notice a temporary feeling of fullness in the rectum which should respond adequately to plain Tylenol or Motrin.  2. Following the banding, avoid strenuous exercise that evening and resume full activity the next day.  A sitz bath (soaking in a warm tub) or bidet is soothing, and can be useful for cleansing the area after bowel movements.     3. To avoid constipation, take two tablespoons of natural wheat bran, natural oat bran, flax, Benefiber or any over the counter fiber supplement and increase your water intake to 7-8 glasses daily.    4. Unless you have been prescribed anorectal medication, do not put anything inside your rectum for two weeks: No suppositories, enemas, fingers, etc.  5. Occasionally, you may have more bleeding than usual after the banding procedure.  This is often from the untreated hemorrhoids rather than the treated one.  Don't be concerned if there is a tablespoon or so of blood.  If there is more blood than this, lie flat with your bottom higher than your head and apply an ice pack to the area. If the bleeding does not stop within a half an hour or if you feel faint, call our office at [phone] or go to the emergency room.  6. Problems are not common; however, if there is a substantial amount of bleeding, severe pain, chills, fever or difficulty passing urine (very rare) or other problems, you should call us at [phone] or report to the nearest emergency room.  7. Do not stay seated continuously for more than 2-3 hours for a day or two  after the procedure.  Tighten your buttock muscles 10-15 times every two hours and take 10-15 deep breaths every 1-2 hours.  Do not spend more than a few minutes on the toilet if you cannot empty your bowel; instead re-visit the toilet at a later time.    Benefiber 1 tablespoon 2-3 times daily

## 2019-06-26 ENCOUNTER — Encounter: Payer: Medicare Other | Admitting: Physical Therapy

## 2019-06-27 ENCOUNTER — Encounter: Payer: Self-pay | Admitting: Physical Therapy

## 2019-06-27 ENCOUNTER — Other Ambulatory Visit: Payer: Self-pay

## 2019-06-27 ENCOUNTER — Ambulatory Visit: Payer: Medicare Other | Admitting: Physical Therapy

## 2019-06-27 DIAGNOSIS — G8929 Other chronic pain: Secondary | ICD-10-CM

## 2019-06-27 DIAGNOSIS — M546 Pain in thoracic spine: Secondary | ICD-10-CM

## 2019-06-27 DIAGNOSIS — M6281 Muscle weakness (generalized): Secondary | ICD-10-CM | POA: Diagnosis not present

## 2019-06-27 DIAGNOSIS — R252 Cramp and spasm: Secondary | ICD-10-CM

## 2019-06-27 DIAGNOSIS — R278 Other lack of coordination: Secondary | ICD-10-CM

## 2019-06-27 NOTE — Patient Instructions (Signed)
Access Code: [facility]  URL: [email or web address]  Date: 06/27/2019  Prepared by: Earlie Counts   Exercises Supine Lower Trunk Rotation - 2 reps - 1 sets - 30 sec hold - 1x daily - 7x weekly Cat-Camel - 10 reps - 1 sets - 1x daily - 7x weekly Supine Chest Press with Resistance - 10 reps - 3 sets - 1x daily - 7x weekly Supine Chest Flys - 10 reps - 3 sets - 1x daily - 7x weekly Full Plank - 4 reps - 1 sets - 30 sec hold - 1x daily - 7x weekly Side Plank on Knees - 4 reps - 1 sets - 15-30 sec hold - 1x daily - 7x weekly Shoulder PNF D2 with Resistance - 10 reps - 1 sets - 1x daily - 7x weekly Side Plank on Elbow - 4 reps - 1 sets - 15-30 sec hold - 1x daily - 7x weekly Standing Shoulder Single Arm PNF D2 Extension with Anchored Resistance - 10 reps - 2 sets - 1x daily - 7x weekly Side planks - 4 reps - 1 sets - 15-30 sec hold - 1x daily - 7x weekly                  Push Up on Table - 10 reps - 2 sets - 1x daily - 7x weekly Seated Shoulder Horizontal Abduction with Resistance - Thumbs Up - 10 reps - 1 sets - 1x daily - 7x weekly Standing Shoulder Diagonal Horizontal Abduction 60/120 Degrees with Resistance - 10 reps - 1 sets - 1x daily - 7x weekly Sit to Stand with Pelvic Floor Contraction - 10 reps - 1 sets - 1x daily - 7x weekly Seated Pelvic Floor Contraction - 10 reps - 1 sets - 20 sec hold - 3x daily - 7x weekly Standing Pelvic Floor Contraction - 10 reps - 1 sets - 5 sec hold - 2x daily - 7x weekly Patient Education Trigger [facility] [address] [place], [place] [address] Phone # [phone] Fax [phone]

## 2019-06-27 NOTE — Therapy (Signed)
[facility] [address] [place], [place], [address] Phone: [phone]   Fax:  [phone]  Physical Therapy Treatment  Patient Details  Name: Jacob Kerr MRN: [id number] Date of Birth: 01-05-1951 Referring Provider (PT): Dr. Ronnie Doss   Encounter Date: 06/27/2019  PT End of Session - 06/27/19 0855    Visit Number  12    Date for PT Re-Evaluation  07/18/19    Authorization Type  UHC Medicare    PT Start Time  0815    PT Stop Time  [id number]    PT Time Calculation (min)  40 min    Activity Tolerance  Patient tolerated treatment well;No increased pain    Behavior During Therapy  WFL for tasks assessed/performed       Past Medical History:  Diagnosis Date  . Acute meniscal tear of left knee   . Allergic rhinitis   . Anal fissure   . Anxiety   . BPH (benign prostatic hypertrophy)   . Cervical spondylosis without myelopathy   . Cervicogenic headache   . Chronic headaches   . Chronic neck pain    uses TENS unit  prn  . Diverticulitis   . Gallstones   . GERD (gastroesophageal reflux disease)   . H/O hiatal hernia   . History of diverticulitis   . History of kidney stones   . History of TIA (transient ischemic attack)    04/ 2011---  no residuals  . Hypertension   . Hypothyroidism   . Peripheral neuropathy   . Shingles   . Syrinx of spinal cord ([place])    C4 -- C7    Past Surgical History:  Procedure Laterality Date  . CARDIAC CATHETERIZATION  02-18-2010   dr Angelena Form   normal coronary arteries/  normal lvsf--  ef 65%  . COLONOSCOPY    . [place]  . HERNIA REPAIR     [facility]  . KNEE ARTHROSCOPY Left 03/13/2014   Procedure: LEFT ARTHROSCOPY KNEE WITH DEBRIDMENT;  Surgeon: Gearlean Alf, MD;  Location: [facility];  Service: Orthopedics;  Laterality: Left;  . LAPAROSCOPIC CHOLECYSTECTOMY  01-30-2003  . NEGATIVE SLEEP STUDY  2013   per pt  .  RADIOFREQUENCY ABLATION NERVES  05/2016  . REMOVAL FORGEIN BODY FROM EAR  AGE 15  . RIGHT URETEROSCOPIC LASER LITHOTRIPSY STONE EXTRACTION /  STENT PLACEMENT  05-10-2000  . TRANSTHORACIC ECHOCARDIOGRAM  01-16-2010   normal lvf/  ef  60%/  mild lae    There were no vitals filed for this visit.  Subjective Assessment - 06/27/19 0818    Subjective  I saw the doctors and no surgery. I had the hemorroid procedure. It was not that bad. I feel like I have to go to the bathroom all the time with the bands in the rectum. The sphincter musce feels weak.I am drinking more water.    Pertinent History  Peripheral neuropathy; headaches; left knee scope; neck pain; TIA.    Patient Stated Goals  reduce pelvic pain, go hunting or hiking without worrying about incontinence, keep up with 38 year old grandson    Currently in Pain?  Yes    Pain Score  7     Pain Location  Back    Pain Orientation  Lower;Right;Mid    Pain Descriptors / Indicators  Shooting;Stabbing;Discomfort    Pain Type  Chronic pain    Pain Onset  More than a month ago  Pain Frequency  Constant    Aggravating Factors   movement    Pain Relieving Factors  hot water, heating pain, TENS unit    Pain Score  4    Pain Location  Rectum    Pain Orientation  Mid    Pain Descriptors / Indicators  Discomfort    Pain Type  Chronic pain    Pain Onset  More than a month ago    Pain Frequency  Intermittent    Aggravating Factors   after intercourse, sitting    Pain Relieving Factors  being completely still, hot water                    Pelvic Floor Special Questions - 06/27/19 0001    Biofeedback  resting tone is 1.5 uv, contract 10 sec to 35uv, after 5 conttractions of 10 seconds avg. is 25uv, contract for 20 seconds at 25 uv,     Biofeedback sensor type  Surface   rectum   Biofeedback Activity  10 second hold;Other   sit to stand, sitting and standing       [facility] Adult PT Treatment/Exercise - 06/27/19 0001      Self-Care    Self-Care  Other Self-Care Comments    Other Self-Care Comments   discussed with patient to see his neurologist to be assessed due to his lose of balance and for him to use a cane      Lumbar Exercises: Aerobic   Nustep  7  minutes; level 2; while assessing patient             PT Education - 06/27/19 0855    Education Details  Access Code: [facility]    Person(s) Educated  Patient    Methods  Explanation;Demonstration;Verbal cues;Handout    Comprehension  Verbalized understanding;Returned demonstration       PT Short Term Goals - 06/06/19 1343      PT SHORT TERM GOAL #4   Title  pain after ejaculation decreased >/= 25%    Baseline  has not had intercourse lately    Time  4    Period  Weeks    Status  Achieved    Target Date  04/26/19        PT Long Term Goals - 06/27/19 [id number]      PT LONG TERM GOAL #1   Title  Independent with a HEP.    Time  68    Period  Weeks    Status  On-going      PT LONG TERM GOAL #2   Title  able to sit with pain level improved >/= 75% due to ability to relax the pelvic floor    Baseline  has not had rectal pain but back pain    Time  8    Period  Weeks    Status  On-going      PT LONG TERM GOAL #3   Title  pain after ejaculation decreased >/= 75%    Baseline  100% better    Period  Weeks    Status  Achieved      PT LONG TERM GOAL #4   Title  able to initiate a urine stream and bowel movement without difficulty    Baseline  40% better, has to pull the scrotum forward sometimes to fully empty    Period  Weeks    Status  On-going      PT LONG TERM GOAL #5   Title  able to play with his 68 year old grandson with minimal to no pain due to improve mobilty and decreased in muscle spasms    Baseline  difficulty due to back pain    Time  8    Period  Weeks    Status  On-going            Plan - 06/27/19 [id number]    Clinical Impression Statement  Patient has seen the neuro surgeon and is not having surgery. Patient lost his balance  2 times but  regained it  while in therapy. Discussed with patient on getting a cane and seeing his neurologist. Patient is able to contract pwlvic floor for 10 and 20 seconds but will fatique after 5 contractions. Patient needs verbal cues to contract the pelvic floor going from sit to stand and back, Patient will benefit from skilled therapy to improve coordination, mobility, and function.    Personal Factors and Comorbidities  Age;Past/Current Experience;Comorbidity 1;Sex;Comorbidity 2;Comorbidity 3+;Time since onset of injury/illness/exacerbation    Comorbidities  peripheral neuropath, headaches; left knee scope, neck pain, TIA    Examination-Activity Limitations  Sit;Continence    Examination-Participation Restrictions  Community Activity    Stability/Clinical Decision Making  Evolving/Moderate complexity    Rehab Potential  Excellent    PT Frequency  1x / week    PT Duration  8 weeks    PT Treatment/Interventions  ADLs/Self Care Home Management;Biofeedback;Cryotherapy;Electrical Stimulation;Moist Heat;Therapeutic exercise;Therapeutic activities;Neuromuscular re-education;Patient/family education;Dry needling;Passive range of motion;Manual techniques;Spinal Manipulations    PT Next Visit Plan  work on biofeedback with pelvic floor control. see about neurologist    PT Home Exercise Plan  Access Code: [email or web address]    Consulted and Agree with Plan of Care  Patient       Patient will benefit from skilled therapeutic intervention in order to improve the following deficits and impairments:  Decreased coordination, Increased fascial restricitons, Decreased endurance, Increased muscle spasms, Decreased activity tolerance, Pain, Hypomobility, Decreased mobility, Decreased strength  Visit Diagnosis: Muscle weakness (generalized)  Cramp and spasm  Chronic bilateral low back pain with right-sided sciatica  Other lack of coordination  Pain in thoracic spine     Problem List Patient Active  Problem List   Diagnosis Date Noted  . Anxiety 10/04/2017  . Nonallopathic lesion of rib cage 06/15/2017  . Nonallopathic lesion of cervical region 05/18/2017  . Nonallopathic lesion of thoracic region 05/18/2017  . Nonallopathic lesion of lumbosacral region 05/18/2017  . Arrhythmia 10/08/2014  . TIA (transient ischemic attack) 08/09/2014  . Lateral meniscal tear 03/12/2014  . Testosterone deficiency 07/04/2013  . Cervicogenic headache 03/28/2013  . Vitamin D deficiency 01/01/2013  . Hyperlipidemia 01/01/2013  . BPH (benign prostatic hyperplasia) 11/03/2012  . Diverticulosis large intestine w/o perforation or abscess w/bleeding 11/03/2012  . Peripheral neuropathy 11/03/2012  . Hypothyroidism 01/18/2011  . Essential hypertension 01/18/2011  . GERD (gastroesophageal reflux disease) 01/18/2011  . Cervical spondylosis without myelopathy 11/24/2010  . Headache(784.0) 11/24/2010  . Sleep disturbance, unspecified 11/24/2010  . Syringomyelia and syringobulbia ([place]) 11/24/2010  . Disturbance of skin sensation 11/24/2010  . Cerebrovascular disease, unspecified 11/24/2010    Earlie Counts, PT 06/27/19 9:01 AM    [facility] [address] [place], [place], [address] Phone: [phone]   Fax:  [phone]  Name: XAVIOUS SELLARDS MRN: [id number] Date of Birth: [birth year]

## 2019-07-03 ENCOUNTER — Other Ambulatory Visit: Payer: Self-pay

## 2019-07-03 ENCOUNTER — Ambulatory Visit: Payer: Medicare Other | Attending: Family Medicine | Admitting: Physical Therapy

## 2019-07-03 DIAGNOSIS — R252 Cramp and spasm: Secondary | ICD-10-CM | POA: Diagnosis present

## 2019-07-03 DIAGNOSIS — M5441 Lumbago with sciatica, right side: Secondary | ICD-10-CM | POA: Diagnosis present

## 2019-07-03 DIAGNOSIS — G8929 Other chronic pain: Secondary | ICD-10-CM

## 2019-07-03 DIAGNOSIS — R278 Other lack of coordination: Secondary | ICD-10-CM | POA: Diagnosis present

## 2019-07-03 DIAGNOSIS — M546 Pain in thoracic spine: Secondary | ICD-10-CM | POA: Diagnosis present

## 2019-07-03 DIAGNOSIS — M6281 Muscle weakness (generalized): Secondary | ICD-10-CM | POA: Diagnosis not present

## 2019-07-03 NOTE — Therapy (Signed)
[facility] [address], [place], [place], [address] Phone: [phone]   Fax:  [phone]  Physical Therapy Treatment  Patient Details  Name: Jacob Kerr MRN: [id number] Date of Birth: 1951-04-26 Referring Provider (PT): Dr. Ronnie Doss   Encounter Date: 07/03/2019  PT End of Session - 07/03/19 0817    Visit Number  13    Date for PT Re-Evaluation  07/18/19    Authorization Type  UHC Medicare    PT Start Time  0815   came late   PT Stop Time  0840    PT Time Calculation (min)  25 min    Activity Tolerance  Patient tolerated treatment well;No increased pain    Behavior During Therapy  WFL for tasks assessed/performed       Past Medical History:  Diagnosis Date  . Acute meniscal tear of left knee   . Allergic rhinitis   . Anal fissure   . Anxiety   . BPH (benign prostatic hypertrophy)   . Cervical spondylosis without myelopathy   . Cervicogenic headache   . Chronic headaches   . Chronic neck pain    uses TENS unit  prn  . Diverticulitis   . Gallstones   . GERD (gastroesophageal reflux disease)   . H/O hiatal hernia   . History of diverticulitis   . History of kidney stones   . History of TIA (transient ischemic attack)    04/ 2011---  no residuals  . Hypertension   . Hypothyroidism   . Peripheral neuropathy   . Shingles   . Syrinx of spinal cord ([place])    C4 -- C7    Past Surgical History:  Procedure Laterality Date  . CARDIAC CATHETERIZATION  02-18-2010   dr Angelena Form   normal coronary arteries/  normal lvsf--  ef 65%  . COLONOSCOPY    . [place]  . HERNIA REPAIR     [facility] [place] [facility]  . KNEE ARTHROSCOPY Left 03/13/2014   Procedure: LEFT ARTHROSCOPY KNEE WITH DEBRIDMENT;  Surgeon: Gearlean Alf, MD;  Location: [facility];  Service: Orthopedics;  Laterality: Left;  . LAPAROSCOPIC CHOLECYSTECTOMY  01-30-2003  . NEGATIVE SLEEP STUDY  2013   per pt  . RADIOFREQUENCY ABLATION NERVES  05/2016  . REMOVAL FORGEIN BODY FROM EAR  AGE 68  . RIGHT URETEROSCOPIC LASER LITHOTRIPSY STONE EXTRACTION /  STENT PLACEMENT  05-10-2000  . TRANSTHORACIC ECHOCARDIOGRAM  01-16-2010   normal lvf/  ef  60%/  mild lae    There were no vitals filed for this visit.  Subjective Assessment - 07/03/19 0817    Subjective  I have not talked to the neurologist. The hemorroid has fallen out.    Pertinent History  Peripheral neuropathy; headaches; left knee scope; neck pain; TIA.    Patient Stated Goals  reduce pelvic pain, go hunting or hiking without worrying about incontinence, keep up with 97 year old grandson    Currently in Pain?  Yes    Pain Score  7     Pain Location  Back    Pain Orientation  Lower;Right;Mid    Pain Descriptors / Indicators  Shooting;Stabbing;Discomfort    Pain Type  Chronic pain    Pain Onset  More than a month ago    Aggravating Factors   movement    Pain Relieving Factors  hot water, heating pain, TENS unit    Multiple Pain Sites  Yes  Pain Score  4    Pain Location  Rectum    Pain Orientation  Mid    Pain Descriptors / Indicators  Discomfort    Pain Type  Chronic pain    Pain Onset  More than a month ago    Pain Frequency  Intermittent    Aggravating Factors   after intercourse, sitting    Pain Relieving Factors  being completely still, hot water                    Pelvic Floor Special Questions - 07/03/19 0001    Biofeedback  sitting resting tone 4 uv, sitting doing the trapezoid program with 50 uv setting, standing  resting tone 2 uv doing the trapezoid program, standing stair program up and down     Biofeedback sensor type  Surface   rectum                 PT Short Term Goals - 06/06/19 1343      PT SHORT TERM GOAL #4   Title  pain after ejaculation decreased >/= 25%    Baseline  has not had intercourse lately    Time  4    Period  Weeks    Status  Achieved    Target Date   04/26/19        PT Long Term Goals - 07/03/19 0820      PT LONG TERM GOAL #1   Title  Independent with a HEP.    Time  8    Period  Weeks    Status  On-going      PT LONG TERM GOAL #2   Title  able to sit with pain level improved >/= 75% due to ability to relax the pelvic floor    Baseline  has not had rectal pain but back pain    Time  8    Period  Weeks    Status  On-going      PT LONG TERM GOAL #3   Title  pain after ejaculation decreased >/= 75%    Status  Achieved      PT LONG TERM GOAL #4   Title  able to initiate a urine stream and bowel movement without difficulty    Baseline  50% better, has to pull the scrotum forward sometimes to fully empty    Time  8    Period  Weeks    Status  On-going      PT LONG TERM GOAL #5   Title  able to play with his 16 year old grandson with minimal to no pain due to improve mobilty and decreased in muscle spasms    Baseline  difficulty due to back pain    Time  8    Period  Weeks    Status  On-going            Plan - 07/03/19 0837    Clinical Impression Statement  Patient is able to take more steps to get to the toilet now. Patient is able to empty his bladder now and is not leaking urine after urinating. Patient is able to initiate his urine stream 50% of the time. Patient did not have pain in the left hip with the pelvic floor contraction. Patient is able to have better control with the pelvic floor with the EMG. Patient resting tone is 2 uv. Patient is able to contrac 35-40 uv in sitting and 30 uv in standing  for 10 seconds. Patient will benefit from skilled therapy to improve coordination, mobility, and function.    Personal Factors and Comorbidities  Age;Past/Current Experience;Comorbidity 1;Sex;Comorbidity 2;Comorbidity 3+;Time since onset of injury/illness/exacerbation    Comorbidities  peripheral neuropath, headaches; left knee scope, neck pain, TIA    Examination-Activity Limitations  Sit;Continence     Examination-Participation Restrictions  Community Activity    Stability/Clinical Decision Making  Evolving/Moderate complexity    PT Frequency  1x / week    PT Duration  8 weeks    PT Treatment/Interventions  ADLs/Self Care Home Management;Biofeedback;Cryotherapy;Electrical Stimulation;Moist Heat;Therapeutic exercise;Therapeutic activities;Neuromuscular re-education;Patient/family education;Dry needling;Passive range of motion;Manual techniques;Spinal Manipulations    PT Next Visit Plan  work on biofeedback with pelvic floor control, work in standing with elevevator    PT Home Exercise Plan  Access Code: [place] and Agree with Plan of Care  Patient       Patient will benefit from skilled therapeutic intervention in order to improve the following deficits and impairments:  Decreased coordination, Increased fascial restricitons, Decreased endurance, Increased muscle spasms, Decreased activity tolerance, Pain, Hypomobility, Decreased mobility, Decreased strength  Visit Diagnosis: Muscle weakness (generalized)  Cramp and spasm  Chronic bilateral low back pain with right-sided sciatica  Other lack of coordination  Pain in thoracic spine     Problem List Patient Active Problem List   Diagnosis Date Noted  . Anxiety 10/04/2017  . Nonallopathic lesion of rib cage 06/15/2017  . Nonallopathic lesion of cervical region 05/18/2017  . Nonallopathic lesion of thoracic region 05/18/2017  . Nonallopathic lesion of lumbosacral region 05/18/2017  . Arrhythmia 10/08/2014  . TIA (transient ischemic attack) 08/09/2014  . Lateral meniscal tear 03/12/2014  . Testosterone deficiency 07/04/2013  . Cervicogenic headache 03/28/2013  . Vitamin D deficiency 01/01/2013  . Hyperlipidemia 01/01/2013  . BPH (benign prostatic hyperplasia) 11/03/2012  . Diverticulosis large intestine w/o perforation or abscess w/bleeding 11/03/2012  . Peripheral neuropathy 11/03/2012  . Hypothyroidism  01/18/2011  . Essential hypertension 01/18/2011  . GERD (gastroesophageal reflux disease) 01/18/2011  . Cervical spondylosis without myelopathy 11/24/2010  . Headache(784.0) 11/24/2010  . Sleep disturbance, unspecified 11/24/2010  . Syringomyelia and syringobulbia ([place]) 11/24/2010  . Disturbance of skin sensation 11/24/2010  . Cerebrovascular disease, unspecified 11/24/2010    Earlie Counts, PT 07/03/19 8:44 AM    [facility] [address], [place], [place], [address] Phone: [phone]   Fax:  [phone]  Name: Jacob Kerr MRN: [id number] Date of Birth: [birth year]

## 2019-07-09 ENCOUNTER — Encounter: Payer: Self-pay | Admitting: Family Medicine

## 2019-07-10 ENCOUNTER — Other Ambulatory Visit: Payer: Self-pay

## 2019-07-10 ENCOUNTER — Encounter: Payer: Self-pay | Admitting: Physical Therapy

## 2019-07-10 ENCOUNTER — Ambulatory Visit: Payer: Medicare Other | Admitting: Physical Therapy

## 2019-07-10 DIAGNOSIS — R252 Cramp and spasm: Secondary | ICD-10-CM

## 2019-07-10 DIAGNOSIS — G8929 Other chronic pain: Secondary | ICD-10-CM

## 2019-07-10 DIAGNOSIS — R278 Other lack of coordination: Secondary | ICD-10-CM

## 2019-07-10 DIAGNOSIS — M546 Pain in thoracic spine: Secondary | ICD-10-CM

## 2019-07-10 DIAGNOSIS — M6281 Muscle weakness (generalized): Secondary | ICD-10-CM | POA: Diagnosis not present

## 2019-07-10 NOTE — Therapy (Signed)
[facility] [address] [place], [place], [address] Phone: [phone]   Fax:  [phone]  Physical Therapy Treatment  Patient Details  Name: Jacob Kerr MRN: [id number] Date of Birth: June 20, 1951 Referring Provider (Jacob Kerr): Dr. Ronnie Doss   Encounter Date: 07/10/2019  Jacob Kerr End of Session - 07/10/19 0823    Visit Number  14    Date for Jacob Kerr Re-Evaluation  09/04/19    Authorization Type  UHC Medicare    Jacob Kerr Start Time  0815   came late   Jacob Kerr Stop Time  0840    Jacob Kerr Time Calculation (min)  25 min    Activity Tolerance  Patient tolerated treatment well;No increased pain    Behavior During Therapy  WFL for tasks assessed/performed       Past Medical History:  Diagnosis Date  . Acute meniscal tear of left knee   . Allergic rhinitis   . Anal fissure   . Anxiety   . BPH (benign prostatic hypertrophy)   . Cervical spondylosis without myelopathy   . Cervicogenic headache   . Chronic headaches   . Chronic neck pain    uses TENS unit  prn  . Diverticulitis   . Gallstones   . GERD (gastroesophageal reflux disease)   . H/O hiatal hernia   . History of diverticulitis   . History of kidney stones   . History of TIA (transient ischemic attack)    04/ 2011---  no residuals  . Hypertension   . Hypothyroidism   . Peripheral neuropathy   . Shingles   . Syrinx of spinal cord ([place])    C4 -- C7    Past Surgical History:  Procedure Laterality Date  . CARDIAC CATHETERIZATION  02-18-2010   dr Angelena Form   normal coronary arteries/  normal lvsf--  ef 65%  . COLONOSCOPY    . [place]  . HERNIA REPAIR     [facility] [place] [facility]  . KNEE ARTHROSCOPY Left 03/13/2014   Procedure: LEFT ARTHROSCOPY KNEE WITH DEBRIDMENT;  Surgeon: Gearlean Alf, MD;  Location: [facility];  Service: Orthopedics;  Laterality: Left;  . LAPAROSCOPIC CHOLECYSTECTOMY  01-30-2003  . NEGATIVE SLEEP STUDY  2013   per Jacob Kerr  . RADIOFREQUENCY ABLATION NERVES  05/2016  . REMOVAL FORGEIN BODY FROM EAR  AGE 69  . RIGHT URETEROSCOPIC LASER LITHOTRIPSY STONE EXTRACTION /  STENT PLACEMENT  05-10-2000  . TRANSTHORACIC ECHOCARDIOGRAM  01-16-2010   normal lvf/  ef  60%/  mild lae    There were no vitals filed for this visit.  Subjective Assessment - 07/10/19 0819    Subjective  My right side hurts.    Pertinent History  Peripheral neuropathy; headaches; left knee scope; neck pain; TIA.    Patient Stated Goals  reduce pelvic pain, go hunting or hiking without worrying about incontinence, keep up with 76 year old grandson    Currently in Pain?  Yes    Pain Score  6     Pain Location  Back    Pain Orientation  Lower;Right;Mid    Pain Descriptors / Indicators  Shooting;Stabbing;Discomfort    Pain Type  Chronic pain    Pain Onset  More than a month ago    Pain Frequency  Constant    Aggravating Factors   movement    Pain Relieving Factors  hot water, heating pain, TENS unit    Pain Score  2    Pain  Location  Rectum    Pain Orientation  Mid    Pain Descriptors / Indicators  Discomfort    Pain Type  Chronic pain    Pain Onset  More than a month ago    Pain Frequency  Intermittent    Aggravating Factors   after intercourse    Pain Relieving Factors  being copletely still, hot water         [facility] Jacob Kerr Assessment - 07/10/19 0001      Assessment   Medical Diagnosis  R10.2 Pelvic Pain    Referring Provider (Jacob Kerr)  Dr. Ronnie Doss    Onset Date/Surgical Date  --   2015   Prior Therapy  for back      Precautions   Precautions  None      Home Environment   Living Environment  Private residence      Prior Function   Level of Independence  Independent    Leisure  stretches, walking, up and down tractor      Cognition   Overall Cognitive Status  Within Functional Limits for tasks assessed      Posture/Postural Control   Posture/Postural Control  Postural limitations    Postural Limitations   Rounded Shoulders;Forward head    Posture Comments  constant motion in sitting      AROM   Lumbar - Right Side Bend  full but painful on right    Lumbar - Left Side Bend  decreased by 25%    Lumbar - Right Rotation  full    Lumbar - Left Rotation  full      Strength   Right Hip ABduction  4/5    Left Hip Flexion  5/5    Left Hip ABduction  5/5                Pelvic Floor Special Questions - 07/10/19 0001    Urinary Leakage  No    Fecal incontinence  Yes   when does not get to the bathroom in time   Pelvic Floor Internal Exam  Patient confirms identification and approves Jacob Kerr to assess muscles and treatment    Exam Type  Rectal    Palpation  tenderness located in bil. obturator internist    Strength  good squeeze, good lift, able to hold agaisnt strong resistance        [facility] Adult Jacob Kerr Treatment/Exercise - 07/10/19 0001      Lumbar Exercises: Aerobic   Nustep  7  minutes; level 2; while assessing patient      Manual Therapy   Manual Therapy  Internal Pelvic Floor;Soft tissue mobilization    Soft tissue mobilization  using addaday to work on the righ tlumbar paraspinals    Internal Pelvic Floor  bil. obturaotr internist, and levator ani       Trigger Point Dry Needling - 07/10/19 0001    Consent Given?  Yes    Education Handout Provided  Previously provided    Muscles Treated Back/Hip  Lumbar multifidi   right            Jacob Kerr Short Term Goals - 06/06/19 1343      Jacob Kerr SHORT TERM GOAL #4   Title  pain after ejaculation decreased >/= 25%    Baseline  has not had intercourse lately    Time  4    Period  Weeks    Status  Achieved    Target Date  04/26/19  Jacob Kerr Long Term Goals - 07/10/19 [id number]      Jacob Kerr LONG TERM GOAL #1   Title  Independent with a HEP.    Time  8    Status  On-going      Jacob Kerr LONG TERM GOAL #2   Title  able to sit with pain level improved >/= 75% due to ability to relax the pelvic floor    Time  8    Period  Weeks    Status   On-going      Jacob Kerr LONG TERM GOAL #3   Title  pain after ejaculation decreased >/= 75%    Time  8    Period  Weeks    Status  Achieved      Jacob Kerr LONG TERM GOAL #4   Title  able to initiate a urine stream and bowel movement without difficulty    Baseline  50% better, has to pull the scrotum forward sometimes to fully empty    Time  8    Period  Weeks    Status  On-going      Jacob Kerr LONG TERM GOAL #5   Title  able to play with his 57 year old grandson with minimal to no pain due to improve mobilty and decreased in muscle spasms    Period  Weeks    Status  On-going      Additional Long Term Goals   Additional Long Term Goals  Yes      Jacob Kerr LONG TERM GOAL #6   Title  urgency to have a bowel movement decreased >/= 80% so he is able to get to the bathroom in time.    Time  8    Period  Weeks    Status  New    Target Date  09/04/19            Plan - 07/10/19 [id number]    Clinical Impression Statement  Patient pelvic floor strength is 4/5 but he still will leak stool when he has the urge and does not get to the bathroom in time. Patient has trigger points in the right lumbar that will refer pain to his right hip and into the penis. Patient reports his urine stream is 50% better. Patient is able to take a few more steps to the bathroo, Patient has some tenderness in the levator ani and obturator internist muscle. Patient urgency is 60% better. Patient is not having leakage after he urinates when he stand some extra time. Patient will beneift from skilled therapy to improve coordination, mobility, and function.    Personal Factors and Comorbidities  Age;Past/Current Experience;Comorbidity 1;Sex;Comorbidity 2;Comorbidity 3+;Time since onset of injury/illness/exacerbation    Comorbidities  peripheral neuropath, headaches; left knee scope, neck pain, TIA    Examination-Activity Limitations  Sit;Continence    Examination-Participation Restrictions  Community Activity    Stability/Clinical Decision  Making  Evolving/Moderate complexity    Rehab Potential  Excellent    Jacob Kerr Frequency  1x / week    Jacob Kerr Duration  8 weeks    Jacob Kerr Treatment/Interventions  ADLs/Self Care Home Management;Biofeedback;Cryotherapy;Electrical Stimulation;Moist Heat;Therapeutic exercise;Therapeutic activities;Neuromuscular re-education;Patient/family education;Dry needling;Passive range of motion;Manual techniques;Spinal Manipulations    Jacob Kerr Next Visit Plan  work on biofeedback with pelvic floor control, work in standing with [place]  Access Code: [facility]    Recommended Other Services  sent MD renewal note on 07/10/2019    Consulted and Agree with Plan of Care  Patient  Patient will benefit from skilled therapeutic intervention in order to improve the following deficits and impairments:  Decreased coordination, Increased fascial restricitons, Decreased endurance, Increased muscle spasms, Decreased activity tolerance, Pain, Hypomobility, Decreased mobility, Decreased strength  Visit Diagnosis: Muscle weakness (generalized) - Plan: Jacob Kerr plan of care cert/re-cert  Cramp and spasm - Plan: Jacob Kerr plan of care cert/re-cert  Chronic bilateral low back pain with right-sided sciatica - Plan: Jacob Kerr plan of care cert/re-cert  Other lack of coordination - Plan: Jacob Kerr plan of care cert/re-cert  Pain in thoracic spine - Plan: Jacob Kerr plan of care cert/re-cert     Problem List Patient Active Problem List   Diagnosis Date Noted  . Anxiety 10/04/2017  . Nonallopathic lesion of rib cage 06/15/2017  . Nonallopathic lesion of cervical region 05/18/2017  . Nonallopathic lesion of thoracic region 05/18/2017  . Nonallopathic lesion of lumbosacral region 05/18/2017  . Arrhythmia 10/08/2014  . TIA (transient ischemic attack) 08/09/2014  . Lateral meniscal tear 03/12/2014  . Testosterone deficiency 07/04/2013  . Cervicogenic headache 03/28/2013  . Vitamin D deficiency 01/01/2013  . Hyperlipidemia 01/01/2013  .  BPH (benign prostatic hyperplasia) 11/03/2012  . Diverticulosis large intestine w/o perforation or abscess w/bleeding 11/03/2012  . Peripheral neuropathy 11/03/2012  . Hypothyroidism 01/18/2011  . Essential hypertension 01/18/2011  . GERD (gastroesophageal reflux disease) 01/18/2011  . Cervical spondylosis without myelopathy 11/24/2010  . Headache(784.0) 11/24/2010  . Sleep disturbance, unspecified 11/24/2010  . Syringomyelia and syringobulbia ([place]) 11/24/2010  . Disturbance of skin sensation 11/24/2010  . Cerebrovascular disease, unspecified 11/24/2010    Jacob Kerr, Jacob Kerr 07/10/19 11:01 AM   [facility] [address] [place], [place], [address] Phone: [phone]   Fax:  [phone]  Name: Jacob Kerr MRN: [id number] Date of Birth: Feb 20, 1951

## 2019-07-11 ENCOUNTER — Encounter: Payer: Self-pay | Admitting: Gastroenterology

## 2019-07-11 ENCOUNTER — Ambulatory Visit: Payer: Medicare Other | Admitting: Gastroenterology

## 2019-07-11 VITALS — BP 132/80 | HR 83 | Temp 98.3°F | Ht 72.0 in | Wt 207.0 lb

## 2019-07-11 DIAGNOSIS — K641 Second degree hemorrhoids: Secondary | ICD-10-CM

## 2019-07-11 DIAGNOSIS — K648 Other hemorrhoids: Secondary | ICD-10-CM

## 2019-07-11 NOTE — Progress Notes (Signed)
PROCEDURE NOTE: The patient presents with symptomatic grade 2 hemorrhoids, requesting rubber band ligation of his/her hemorrhoidal disease. All risks, benefits and alternative forms of therapy were described and informed consent was obtained.  DRE revealed: nml Anoscopy: prominent RA column of Int HR  The anorectum was pre-medicated with 0.125% NTG and lubricant. The decision was made to band the RA internal hemorrhoids, and the [place] was used to perform band ligation without complication. Digital anorectal examination was then performed to assure proper positioning of the band, and to adjust the banded tissue as required. The patient was discharged home without pain or other issues. Dietary and behavioral recommendations were given and along with follow-up instructions.   The following adjunctive treatments were recommended:  Continue benefiber daily  The patient will return as needed  for follow-up and possible additional banding as required. No complications were encountered and the patient tolerated the procedure well.

## 2019-07-11 NOTE — Patient Instructions (Signed)
If you are age 68 or older, your body mass index should be between 23-30. Your There is no height or weight on file to calculate BMI. If this is out of the aforementioned range listed, please consider follow up with your Primary Care Provider.  If you are age 35 or younger, your body mass index should be between 19-25. Your There is no height or weight on file to calculate BMI. If this is out of the aformentioned range listed, please consider follow up with your Primary Care Provider.   HEMORRHOID BANDING PROCEDURE    FOLLOW-UP CARE   1. The procedure you have had should have been relatively painless since the banding of the area involved does not have nerve endings and there is no pain sensation.  The rubber band cuts off the blood supply to the hemorrhoid and the band may fall off as soon as 48 hours after the banding (the band may occasionally be seen in the toilet bowl following a bowel movement). You may notice a temporary feeling of fullness in the rectum which should respond adequately to plain Tylenol or Motrin.  2. Following the banding, avoid strenuous exercise that evening and resume full activity the next day.  A sitz bath (soaking in a warm tub) or bidet is soothing, and can be useful for cleansing the area after bowel movements.     3. To avoid constipation, take two tablespoons of natural wheat bran, natural oat bran, flax, Benefiber or any over the counter fiber supplement and increase your water intake to 7-8 glasses daily.    4. Unless you have been prescribed anorectal medication, do not put anything inside your rectum for two weeks: No suppositories, enemas, fingers, etc.  5. Occasionally, you may have more bleeding than usual after the banding procedure.  This is often from the untreated hemorrhoids rather than the treated one.  Don't be concerned if there is a tablespoon or so of blood.  If there is more blood than this, lie flat with your bottom higher than your head and  apply an ice pack to the area. If the bleeding does not stop within a half an hour or if you feel faint, call our office at [phone] or go to the emergency room.  6. Problems are not common; however, if there is a substantial amount of bleeding, severe pain, chills, fever or difficulty passing urine (very rare) or other problems, you should call us at [phone] or report to the nearest emergency room.  7. Do not stay seated continuously for more than 2-3 hours for a day or two after the procedure.  Tighten your buttock muscles 10-15 times every two hours and take 10-15 deep breaths every 1-2 hours.  Do not spend more than a few minutes on the toilet if you cannot empty your bowel; instead re-visit the toilet at a later time.    It was a pleasure to see you today!  Dr. Loletha Carrow

## 2019-07-16 ENCOUNTER — Ambulatory Visit: Payer: Medicare Other | Admitting: Physical Therapy

## 2019-07-16 ENCOUNTER — Other Ambulatory Visit: Payer: Self-pay

## 2019-07-16 ENCOUNTER — Encounter: Payer: Self-pay | Admitting: Physical Therapy

## 2019-07-16 DIAGNOSIS — M6281 Muscle weakness (generalized): Secondary | ICD-10-CM | POA: Diagnosis not present

## 2019-07-16 DIAGNOSIS — R278 Other lack of coordination: Secondary | ICD-10-CM

## 2019-07-16 DIAGNOSIS — G8929 Other chronic pain: Secondary | ICD-10-CM

## 2019-07-16 DIAGNOSIS — M5441 Lumbago with sciatica, right side: Secondary | ICD-10-CM

## 2019-07-16 DIAGNOSIS — R252 Cramp and spasm: Secondary | ICD-10-CM

## 2019-07-16 DIAGNOSIS — M546 Pain in thoracic spine: Secondary | ICD-10-CM

## 2019-07-16 NOTE — Therapy (Signed)
[facility] [address], [place], [place], [address] Phone: [phone]   Fax:  [phone]  Physical Therapy Treatment  Patient Details  Name: Jacob Kerr MRN: [id number] Date of Birth: 09-Jan-1951 Referring Provider (PT): Dr. Ronnie Doss   Encounter Date: 07/16/2019  PT End of Session - 07/16/19 1512    Visit Number  15    Date for PT Re-Evaluation  09/04/19    Authorization Type  [employer] Medicare    PT Start Time  [id number]    PT Stop Time  1523    PT Time Calculation (min)  38 min    Activity Tolerance  Patient tolerated treatment well;No increased pain    Behavior During Therapy  WFL for tasks assessed/performed       Past Medical History:  Diagnosis Date  . Acute meniscal tear of left knee   . Allergic rhinitis   . Anal fissure   . Anxiety   . BPH (benign prostatic hypertrophy)   . Cervical spondylosis without myelopathy   . Cervicogenic headache   . Chronic headaches   . Chronic neck pain    uses TENS unit  prn  . Diverticulitis   . Gallstones   . GERD (gastroesophageal reflux disease)   . H/O hiatal hernia   . History of diverticulitis   . History of kidney stones   . History of TIA (transient ischemic attack)    04/ 2011---  no residuals  . Hypertension   . Hypothyroidism   . Peripheral neuropathy   . Shingles   . Syrinx of spinal cord ([place])    C4 -- C7    Past Surgical History:  Procedure Laterality Date  . CARDIAC CATHETERIZATION  02-18-2010   dr Angelena Form   normal coronary arteries/  normal lvsf--  ef 65%  . COLONOSCOPY    . [place]  . HERNIA REPAIR     [facility] [place] [facility]  . KNEE ARTHROSCOPY Left 03/13/2014   Procedure: LEFT ARTHROSCOPY KNEE WITH DEBRIDMENT;  Surgeon: Gearlean Alf, MD;  Location: [facility];  Service: Orthopedics;  Laterality: Left;  . LAPAROSCOPIC CHOLECYSTECTOMY  01-30-2003  . NEGATIVE SLEEP STUDY  2013   per pt  .  RADIOFREQUENCY ABLATION NERVES  05/2016  . REMOVAL FORGEIN BODY FROM EAR  AGE 8  . RIGHT URETEROSCOPIC LASER LITHOTRIPSY STONE EXTRACTION /  STENT PLACEMENT  05-10-2000  . TRANSTHORACIC ECHOCARDIOGRAM  01-16-2010   normal lvf/  ef  60%/  mild lae    There were no vitals filed for this visit.  Subjective Assessment - 07/16/19 1449    Subjective  I am wearing a girdle and it has helped me and relax my pelvic floor. No rectal pain with intercourse.    Pertinent History  Peripheral neuropathy; headaches; left knee scope; neck pain; TIA.    Patient Stated Goals  reduce pelvic pain, go hunting or hiking without worrying about incontinence, keep up with 68 year old grandson    Currently in Pain?  Yes    Pain Score  8     Pain Location  Back    Pain Orientation  Right;Left;Mid    Pain Descriptors / Indicators  Shooting;Stabbing;Discomfort    Pain Type  Chronic pain    Pain Onset  More than a month ago    Pain Frequency  Constant    Aggravating Factors   movement    Pain Relieving Factors  hot water, heating  pad, TENS unit    Multiple Pain Sites  No                       [facility] Adult PT Treatment/Exercise - 07/16/19 0001      Neuro Re-ed    Neuro Re-ed Details   pelvic floor contraction with elevator and releasing to work on pelvic floor control in supine      Manual Therapy   Manual Therapy  Soft tissue mobilization    Soft tissue mobilization  using the addaday to the thorcic lumbar praspinals       Trigger Point Dry Needling - 07/16/19 0001    Consent Given?  Yes    Education Handout Provided  Previously provided    Muscles Treated Back/Hip  Lumbar multifidi    Other Dry Needling  T5-T12    Lumbar multifidi Response  Twitch response elicited;Palpable increased muscle length           PT Education - 07/16/19 1526    Education Details  pelvic floor contraction with elevator    Person(s) Educated  Patient    Methods  Explanation;Demonstration;Handout     Comprehension  Verbalized understanding;Returned demonstration       PT Short Term Goals - 06/06/19 1343      PT SHORT TERM GOAL #4   Title  pain after ejaculation decreased >/= 25%    Baseline  has not had intercourse lately    Time  4    Period  Weeks    Status  Achieved    Target Date  04/26/19        PT Long Term Goals - 07/16/19 1509      PT LONG TERM GOAL #1   Title  Independent with a HEP.    Time  8    Period  Weeks    Status  On-going      PT LONG TERM GOAL #2   Title  able to sit with pain level improved >/= 75% due to ability to relax the pelvic floor    Baseline  sit 45 minutes    Time  8    Period  Weeks    Status  On-going      PT LONG TERM GOAL #3   Title  pain after ejaculation decreased >/= 75%    Time  8    Period  Weeks    Status  Achieved      PT LONG TERM GOAL #4   Title  able to initiate a urine stream and bowel movement without difficulty    Baseline  wait after urinating to make sure he is not going to leak, able to stop the urine flow    Time  8    Period  Weeks    Status  On-going      PT LONG TERM GOAL #5   Title  able to play with his 68 year old grandson with minimal to no pain due to improve mobilty and decreased in muscle spasms    Time  8    Period  Weeks    Status  On-going      PT LONG TERM GOAL #6   Title  urgency to have a bowel movement decreased >/= 80% so he is able to get to the bathroom in time.    Baseline  rectally the 40% better    Time  8    Period  Weeks    Status  On-going            Plan - 07/16/19 1508    Clinical Impression Statement  Patient was able to contract the pelvic floor to prevent fecal leakage this week. Patient did not have rectal pain with intercourse. Patient is using a brace for the low back that is helping the pelvic floor to relax. Patient reports no urinary leakage. Patient is able to sit for 30-45 minutes without pain. Patient back pain has increased from all the work he did during  the weekend. Patient will benefit from skilled therapy to improve coordination, mobility, and function.    Personal Factors and Comorbidities  Age;Past/Current Experience;Comorbidity 1;Sex;Comorbidity 2;Comorbidity 3+;Time since onset of injury/illness/exacerbation    Comorbidities  peripheral neuropath, headaches; left knee scope, neck pain, TIA    Examination-Activity Limitations  Sit;Continence    Examination-Participation Restrictions  Community Activity    Stability/Clinical Decision Making  Evolving/Moderate complexity    Rehab Potential  Excellent    PT Frequency  1x / week    PT Duration  8 weeks    PT Treatment/Interventions  ADLs/Self Care Home Management;Biofeedback;Cryotherapy;Electrical Stimulation;Moist Heat;Therapeutic exercise;Therapeutic activities;Neuromuscular re-education;Patient/family education;Dry needling;Passive range of motion;Manual techniques;Spinal Manipulations    PT Next Visit Plan  work on biofeedback with pelvic floor control    PT Home Exercise Plan  Access Code: [email or web address]    Recommended Other Services  MD signed all notes    Consulted and Agree with Plan of Care  Patient       Patient will benefit from skilled therapeutic intervention in order to improve the following deficits and impairments:  Decreased coordination, Increased fascial restricitons, Decreased endurance, Increased muscle spasms, Decreased activity tolerance, Pain, Hypomobility, Decreased mobility, Decreased strength  Visit Diagnosis: Muscle weakness (generalized)  Cramp and spasm  Chronic bilateral low back pain with right-sided sciatica  Other lack of coordination  Pain in thoracic spine     Problem List Patient Active Problem List   Diagnosis Date Noted  . Anxiety 10/04/2017  . Nonallopathic lesion of rib cage 06/15/2017  . Nonallopathic lesion of cervical region 05/18/2017  . Nonallopathic lesion of thoracic region 05/18/2017  . Nonallopathic lesion of lumbosacral region  05/18/2017  . Arrhythmia 10/08/2014  . TIA (transient ischemic attack) 08/09/2014  . Lateral meniscal tear 03/12/2014  . Testosterone deficiency 07/04/2013  . Cervicogenic headache 03/28/2013  . Vitamin D deficiency 01/01/2013  . Hyperlipidemia 01/01/2013  . BPH (benign prostatic hyperplasia) 11/03/2012  . Diverticulosis large intestine w/o perforation or abscess w/bleeding 11/03/2012  . Peripheral neuropathy 11/03/2012  . Hypothyroidism 01/18/2011  . Essential hypertension 01/18/2011  . GERD (gastroesophageal reflux disease) 01/18/2011  . Cervical spondylosis without myelopathy 11/24/2010  . Headache(784.0) 11/24/2010  . Sleep disturbance, unspecified 11/24/2010  . Syringomyelia and syringobulbia ([name]) 11/24/2010  . Disturbance of skin sensation 11/24/2010  . Cerebrovascular disease, unspecified 11/24/2010    Jacob Kerr, PT 07/16/19 3:31 PM   [facility] [address], [place], [place], [address] Phone: [phone]   Fax:  [phone]  Name: Jacob Kerr MRN: [id number] Date of Birth: Aug 01, 1951

## 2019-07-16 NOTE — Patient Instructions (Addendum)
Elevator (Supine)    Lie flat. Imagine pelvic floor with 3 levels. "Going up", contract pelvic floor and Hold for __2_ seconds on each level. Then "going down", contract pelvic floor and Hold for 2___ seconds on each level. Relax. Repeat __5_ times. Do __1_ times a day.   Copyright  VHI. All rights reserved.  [facility] [address], [place], [place] [address] Phone # [phone] Fax [phone]

## 2019-07-16 NOTE — Progress Notes (Signed)
Reviewed and agree with documentation and assessment and plan. K. Veena Moon Budde , MD   

## 2019-07-20 ENCOUNTER — Other Ambulatory Visit: Payer: Self-pay | Admitting: Family Medicine

## 2019-07-24 ENCOUNTER — Ambulatory Visit: Payer: Medicare Other | Admitting: Physical Therapy

## 2019-07-24 ENCOUNTER — Encounter: Payer: Self-pay | Admitting: Physical Therapy

## 2019-07-24 ENCOUNTER — Other Ambulatory Visit: Payer: Self-pay

## 2019-07-24 DIAGNOSIS — M6281 Muscle weakness (generalized): Secondary | ICD-10-CM | POA: Diagnosis not present

## 2019-07-24 DIAGNOSIS — M5441 Lumbago with sciatica, right side: Secondary | ICD-10-CM

## 2019-07-24 DIAGNOSIS — M546 Pain in thoracic spine: Secondary | ICD-10-CM

## 2019-07-24 DIAGNOSIS — G8929 Other chronic pain: Secondary | ICD-10-CM

## 2019-07-24 DIAGNOSIS — R278 Other lack of coordination: Secondary | ICD-10-CM

## 2019-07-24 DIAGNOSIS — R252 Cramp and spasm: Secondary | ICD-10-CM

## 2019-07-24 NOTE — Therapy (Signed)
[facility] [address], [place], [place], [address] Phone: [phone]   Fax:  [phone]  Physical Therapy Treatment  Patient Details  Name: Jacob Kerr MRN: [id number] Date of Birth: 04-24-1951 Referring Provider (PT): Dr. Ronnie Doss   Encounter Date: 07/24/2019  PT End of Session - 07/24/19 1142    Visit Number  16    Date for PT Re-Evaluation  09/04/19    Authorization Type  UHC Medicare    PT Start Time  1100    PT Stop Time  1140    PT Time Calculation (min)  40 min    Activity Tolerance  Patient tolerated treatment well;No increased pain    Behavior During Therapy  WFL for tasks assessed/performed       Past Medical History:  Diagnosis Date  . Acute meniscal tear of left knee   . Allergic rhinitis   . Anal fissure   . Anxiety   . BPH (benign prostatic hypertrophy)   . Cervical spondylosis without myelopathy   . Cervicogenic headache   . Chronic headaches   . Chronic neck pain    uses TENS unit  prn  . Diverticulitis   . Gallstones   . GERD (gastroesophageal reflux disease)   . H/O hiatal hernia   . History of diverticulitis   . History of kidney stones   . History of TIA (transient ischemic attack)    04/ 2011---  no residuals  . Hypertension   . Hypothyroidism   . Peripheral neuropathy   . Shingles   . Syrinx of spinal cord ([place])    C4 -- C7    Past Surgical History:  Procedure Laterality Date  . CARDIAC CATHETERIZATION  02-18-2010   dr Angelena Form   normal coronary arteries/  normal lvsf--  ef 65%  . COLONOSCOPY    . [place]  . HERNIA REPAIR     [facility] [place] [facility]  . KNEE ARTHROSCOPY Left 03/13/2014   Procedure: LEFT ARTHROSCOPY KNEE WITH DEBRIDMENT;  Surgeon: Gearlean Alf, MD;  Location: [facility];  Service: Orthopedics;  Laterality: Left;  . LAPAROSCOPIC CHOLECYSTECTOMY  01-30-2003  . NEGATIVE SLEEP STUDY  2013   per pt  .  RADIOFREQUENCY ABLATION NERVES  05/2016  . REMOVAL FORGEIN BODY FROM EAR  AGE 68  . RIGHT URETEROSCOPIC LASER LITHOTRIPSY STONE EXTRACTION /  STENT PLACEMENT  05-10-2000  . TRANSTHORACIC ECHOCARDIOGRAM  01-16-2010   normal lvf/  ef  60%/  mild lae    There were no vitals filed for this visit.  Subjective Assessment - 07/24/19 1104    Subjective  I have been doing my exercises and have more control in the sphincter. I can hold back a stream of urine now. Patient has had bad days of back pain. When patient has the urge to go to the bathroom able to walk at a brisk walk to have a bowel movement. Rectal pian is 70% better. Patient able to fully empty his bladder and does not have not dribble afterwards.    Pertinent History  Peripheral neuropathy; headaches; left knee scope; neck pain; TIA.    Patient Stated Goals  reduce pelvic pain, go hunting or hiking without worrying about incontinence, keep up with 32 year old grandson    Currently in Pain?  Yes    Pain Score  6     Pain Location  Back    Pain Orientation  Right;Left;Mid  Pain Descriptors / Information systems manager;Stabbing;Discomfort    Pain Type  Chronic pain    Pain Onset  More than a month ago    Pain Frequency  Constant    Aggravating Factors   movement    Pain Relieving Factors  hot water, heating pad, TENS unit    Multiple Pain Sites  No         [facility] PT Assessment - 07/24/19 0001      Strength   Overall Strength Comments  abdominal strength is 4/5 when he focuses to reduce his hernia                   [facility] Adult PT Treatment/Exercise - 07/24/19 0001      Lumbar Exercises: Supine   Straight Leg Raise  10 reps;1 second    Straight Leg Raises Limitations  each leg wtih abominal bracing    Isometric Hip Flexion  10 reps;5 seconds   each leg   Other Supine Lumbar Exercises  abdominal bracing with lifting head to becareful of hernia 10 times.     Other Supine Lumbar Exercises  supine with double leg lift and hip  abduction with adominal bracing      Manual Therapy   Manual Therapy  Soft tissue mobilization    Soft tissue mobilization  using the addaday to the thorcic lumbar praspinals       Trigger Point Dry Needling - 07/24/19 0001    Consent Given?  Yes    Education Handout Provided  Previously provided    Muscles Treated Back/Hip  Lumbar multifidi    Other Dry Needling  T5-T12    Lumbar multifidi Response  Twitch response elicited;Palpable increased muscle length           PT Education - 07/24/19 1141    Education Details  how to contract the abdominals without the hernia showing    Person(s) Educated  Patient    Methods  Explanation;Demonstration    Comprehension  Verbalized understanding;Returned demonstration       PT Short Term Goals - 06/06/19 1343      PT SHORT TERM GOAL #4   Title  pain after ejaculation decreased >/= 25%    Baseline  has not had intercourse lately    Time  4    Period  Weeks    Status  Achieved    Target Date  04/26/19        PT Long Term Goals - 07/24/19 1144      PT LONG TERM GOAL #4   Title  able to initiate a urine stream and bowel movement without difficulty    Time  8    Period  Weeks    Status  Achieved            Plan - 07/24/19 1109    Clinical Impression Statement  Patient has not had any urinary leakage after her urinates. Patient is able to get to the bathroom in time this week for a bowel movement. Patient reports the urgency has decreased by 70%. Patient back pain decreased to 3/10 after the manual work. Patient is able to fully empty his bladder. Patient is doing much better. Patient will benefit from skilled therapy to improve coordination, mobility and function.    Personal Factors and Comorbidities  Age;Past/Current Experience;Comorbidity 1;Sex;Comorbidity 2;Comorbidity 3+;Time since onset of injury/illness/exacerbation    Comorbidities  peripheral neuropath, headaches; left knee scope, neck pain, TIA     Examination-Activity Limitations  Sit;Continence    Rehab Potential  Excellent    PT Frequency  1x / week    PT Duration  8 weeks    PT Treatment/Interventions  ADLs/Self Care Home Management;Biofeedback;Cryotherapy;Electrical Stimulation;Moist Heat;Therapeutic exercise;Therapeutic activities;Neuromuscular re-education;Patient/family education;Dry needling;Passive range of motion;Manual techniques;Spinal Manipulations    PT Next Visit Plan  work on core control, if doing better than possible discharge.    PT Home Exercise Plan  Access Code: [email or web address]    Consulted and Agree with Plan of Care  Patient       Patient will benefit from skilled therapeutic intervention in order to improve the following deficits and impairments:  Decreased coordination, Increased fascial restricitons, Decreased endurance, Increased muscle spasms, Decreased activity tolerance, Pain, Hypomobility, Decreased mobility, Decreased strength  Visit Diagnosis: Muscle weakness (generalized)  Cramp and spasm  Chronic bilateral low back pain with right-sided sciatica  Other lack of coordination  Pain in thoracic spine     Problem List Patient Active Problem List   Diagnosis Date Noted  . Anxiety 10/04/2017  . Nonallopathic lesion of rib cage 06/15/2017  . Nonallopathic lesion of cervical region 05/18/2017  . Nonallopathic lesion of thoracic region 05/18/2017  . Nonallopathic lesion of lumbosacral region 05/18/2017  . Arrhythmia 10/08/2014  . TIA (transient ischemic attack) 08/09/2014  . Lateral meniscal tear 03/12/2014  . Testosterone deficiency 07/04/2013  . Cervicogenic headache 03/28/2013  . Vitamin D deficiency 01/01/2013  . Hyperlipidemia 01/01/2013  . BPH (benign prostatic hyperplasia) 11/03/2012  . Diverticulosis large intestine w/o perforation or abscess w/bleeding 11/03/2012  . Peripheral neuropathy 11/03/2012  . Hypothyroidism 01/18/2011  . Essential hypertension 01/18/2011  . GERD  (gastroesophageal reflux disease) 01/18/2011  . Cervical spondylosis without myelopathy 11/24/2010  . Headache(784.0) 11/24/2010  . Sleep disturbance, unspecified 11/24/2010  . Syringomyelia and syringobulbia ([place]) 11/24/2010  . Disturbance of skin sensation 11/24/2010  . Cerebrovascular disease, unspecified 11/24/2010    Earlie Counts, PT 07/24/19 11:46 AM   [facility] [address], [place], [place], [address] Phone: [phone]   Fax:  [phone]  Name: Jacob Kerr MRN: [id number] Date of Birth: 1951/04/27

## 2019-07-30 ENCOUNTER — Encounter: Payer: Self-pay | Admitting: Physical Therapy

## 2019-07-30 ENCOUNTER — Other Ambulatory Visit: Payer: Self-pay

## 2019-07-30 ENCOUNTER — Ambulatory Visit: Payer: Medicare Other | Attending: Family Medicine | Admitting: Physical Therapy

## 2019-07-30 DIAGNOSIS — R252 Cramp and spasm: Secondary | ICD-10-CM | POA: Insufficient documentation

## 2019-07-30 DIAGNOSIS — M546 Pain in thoracic spine: Secondary | ICD-10-CM | POA: Insufficient documentation

## 2019-07-30 DIAGNOSIS — G8929 Other chronic pain: Secondary | ICD-10-CM | POA: Diagnosis present

## 2019-07-30 DIAGNOSIS — M6281 Muscle weakness (generalized): Secondary | ICD-10-CM

## 2019-07-30 DIAGNOSIS — M5441 Lumbago with sciatica, right side: Secondary | ICD-10-CM | POA: Diagnosis present

## 2019-07-30 DIAGNOSIS — R278 Other lack of coordination: Secondary | ICD-10-CM | POA: Insufficient documentation

## 2019-07-30 NOTE — Therapy (Signed)
[facility] [address], [place], [place], [address] Phone: [phone]   Fax:  [phone]  Physical Therapy Treatment  Patient Details  Name: Jacob Kerr MRN: [id number] Date of Birth: 11-03-1950 Referring Provider (PT): Dr. Ronnie Doss   Encounter Date: 07/30/2019  PT End of Session - 07/30/19 0852    Visit Number  17    Date for PT Re-Evaluation  09/04/19    Authorization Type  [employer]HC Medicare    PT Start Time  0845    PT Stop Time  0925    PT Time Calculation (min)  40 min    Activity Tolerance  Patient tolerated treatment well;No increased pain    Behavior During Therapy  WFL for tasks assessed/performed       Past Medical History:  Diagnosis Date  . Acute meniscal tear of left knee   . Allergic rhinitis   . Anal fissure   . Anxiety   . BPH (benign prostatic hypertrophy)   . Cervical spondylosis without myelopathy   . Cervicogenic headache   . Chronic headaches   . Chronic neck pain    uses TENS unit  prn  . Diverticulitis   . Gallstones   . GERD (gastroesophageal reflux disease)   . H/O hiatal hernia   . History of diverticulitis   . History of kidney stones   . History of TIA (transient ischemic attack)    04/ 2011---  no residuals  . Hypertension   . Hypothyroidism   . Peripheral neuropathy   . Shingles   . Syrinx of spinal cord ([place])    C4 -- C7    Past Surgical History:  Procedure Laterality Date  . CARDIAC CATHETERIZATION  02-18-2010   dr Angelena Form   normal coronary arteries/  normal lvsf--  ef 65%  . COLONOSCOPY    . [place]  . HERNIA REPAIR     [facility] [place] [facility]  . KNEE ARTHROSCOPY Left 03/13/2014   Procedure: LEFT ARTHROSCOPY KNEE WITH DEBRIDMENT;  Surgeon: Gearlean Alf, MD;  Location: [facility];  Service: Orthopedics;  Laterality: Left;  . LAPAROSCOPIC CHOLECYSTECTOMY  01-30-2003  . NEGATIVE SLEEP STUDY  2013   per pt  .  RADIOFREQUENCY ABLATION NERVES  05/2016  . REMOVAL FORGEIN BODY FROM EAR  AGE 8  . RIGHT URETEROSCOPIC LASER LITHOTRIPSY STONE EXTRACTION /  STENT PLACEMENT  05-10-2000  . TRANSTHORACIC ECHOCARDIOGRAM  01-16-2010   normal lvf/  ef  60%/  mild lae    There were no vitals filed for this visit.  Subjective Assessment - 07/30/19 0852    Subjective  I felt good after the last session and did not have to change positions much. No pain in rectal area.    Pertinent History  Peripheral neuropathy; headaches; left knee scope; neck pain; TIA.    Patient Stated Goals  reduce pelvic pain, go hunting or hiking without worrying about incontinence, keep up with 33 year old grandson    Currently in Pain?  Yes    Pain Score  7     Pain Location  Thoracic    Pain Orientation  Right;Left    Pain Descriptors / Indicators  Shooting;Stabbing;Discomfort    Pain Type  Chronic pain    Pain Onset  More than a month ago    Pain Frequency  Constant    Aggravating Factors   movement    Pain Relieving Factors  hot water, heating  pad, TENS unit    Multiple Pain Sites  No         [facility] PT Assessment - 07/30/19 0001      Strength   Right Hip ABduction  4+/5    Left Hip Flexion  5/5    Left Hip ABduction  5/5                   [facility] Adult PT Treatment/Exercise - 07/30/19 0001      Lumbar Exercises: Stretches   Other Lumbar Stretch Exercise  supine with foam roll along the thoracic area to mobilize the thoracic      Lumbar Exercises: Supine   Bent Knee Raise  10 reps   each side with abdominal bracing   Dead Bug  20 reps;1 second    Dead Bug Limitations  with abdominal bracing    Bridge with March  20 reps;1 second    Bridge with [employer] Limitations  VC on abdominal bracing and contract the gluteal with the leg on the mat    Straight Leg Raise  10 reps;1 second    Straight Leg Raises Limitations  each leg wtih abominal bracing; tactile cues to engage muscles      Manual Therapy    Manual Therapy  Soft tissue mobilization;Joint mobilization    Joint Mobilization  ot bilateral lower rib cage to bring ribs lower and engage the abdominals better; P-A and rotational mobilization to T3-T9 grade III to improve rib cage movement    Soft tissue mobilization  bil. psoas, lateral obliques, inferior abdominals               PT Short Term Goals - 06/06/19 1343      PT SHORT TERM GOAL #4   Title  pain after ejaculation decreased >/= 25%    Baseline  has not had intercourse lately    Time  4    Period  Weeks    Status  Achieved    Target Date  04/26/19        PT Long Term Goals - 07/30/19 0849      PT LONG TERM GOAL #1   Title  Independent with a HEP.    Period  Weeks    Status  On-going      PT LONG TERM GOAL #2   Title  able to sit with pain level improved >/= 75% due to ability to relax the pelvic floor    Baseline  sit 45 minutes    Time  8    Period  Weeks    Status  Achieved      PT LONG TERM GOAL #3   Title  pain after ejaculation decreased >/= 75%    Baseline  100% better    Time  8    Period  Weeks    Status  Achieved      PT LONG TERM GOAL #4   Title  able to initiate a urine stream and bowel movement without difficulty    Time  8    Period  Weeks    Status  Achieved      PT LONG TERM GOAL #5   Title  able to play with his 77 year old grandson with minimal to no pain due to improve mobilty and decreased in muscle spasms    Baseline  60% better    Time  8    Period  Weeks    Status  On-going  PT LONG TERM GOAL #6   Title  urgency to have a bowel movement decreased >/= 80% so he is able to get to the bathroom in time.    Baseline  75% better    Period  Weeks    Status  On-going            Plan - 07/30/19 [id number]    Clinical Impression Statement  Patient reports the urgency has decreased by 75%. Patient reports the vibration he would get in his genitals decresaed by 80%. Patient has tightness in the thoracic and rib cage  making it difficulty wot get a full abdominal contraction and reduce the doming in the abominals with exercise. Patient had tightness in bilateral psoas. Patient is not having rectal pain and able to sit with 75% decreased in pain. Patient will benefit from skilled therapy to work on the abdominal contraction and reducing the urgency.    Personal Factors and Comorbidities  Age;Past/Current Experience;Comorbidity 1;Sex;Comorbidity 2;Comorbidity 3+;Time since onset of injury/illness/exacerbation    Comorbidities  peripheral neuropath, headaches; left knee scope, neck pain, TIA    Examination-Activity Limitations  Sit;Continence    Examination-Participation Restrictions  Community Activity    Stability/Clinical Decision Making  Evolving/Moderate complexity    Rehab Potential  Excellent    PT Frequency  1x / week    PT Duration  8 weeks    PT Treatment/Interventions  ADLs/Self Care Home Management;Biofeedback;Cryotherapy;Electrical Stimulation;Moist Heat;Therapeutic exercise;Therapeutic activities;Neuromuscular re-education;Patient/family education;Dry needling;Passive range of motion;Manual techniques;Spinal Manipulations    PT Next Visit Plan  work on core control, work on rib mobility    PT Home Exercise Plan  Access Code: [place] and Agree with Plan of Care  Patient       Patient will benefit from skilled therapeutic intervention in order to improve the following deficits and impairments:  Decreased coordination, Increased fascial restricitons, Decreased endurance, Increased muscle spasms, Decreased activity tolerance, Pain, Hypomobility, Decreased mobility, Decreased strength  Visit Diagnosis: Muscle weakness (generalized)  Cramp and spasm  Chronic bilateral low back pain with right-sided sciatica  Other lack of coordination  Pain in thoracic spine     Problem List Patient Active Problem List   Diagnosis Date Noted  . Anxiety 10/04/2017  . Nonallopathic lesion of  rib cage 06/15/2017  . Nonallopathic lesion of cervical region 05/18/2017  . Nonallopathic lesion of thoracic region 05/18/2017  . Nonallopathic lesion of lumbosacral region 05/18/2017  . Arrhythmia 10/08/2014  . TIA (transient ischemic attack) 08/09/2014  . Lateral meniscal tear 03/12/2014  . Testosterone deficiency 07/04/2013  . Cervicogenic headache 03/28/2013  . Vitamin D deficiency 01/01/2013  . Hyperlipidemia 01/01/2013  . BPH (benign prostatic hyperplasia) 11/03/2012  . Diverticulosis large intestine w/o perforation or abscess w/bleeding 11/03/2012  . Peripheral neuropathy 11/03/2012  . Hypothyroidism 01/18/2011  . Essential hypertension 01/18/2011  . GERD (gastroesophageal reflux disease) 01/18/2011  . Cervical spondylosis without myelopathy 11/24/2010  . Headache(784.0) 11/24/2010  . Sleep disturbance, unspecified 11/24/2010  . Syringomyelia and syringobulbia ([place]) 11/24/2010  . Disturbance of skin sensation 11/24/2010  . Cerebrovascular disease, unspecified 11/24/2010    Earlie Counts, PT 07/30/19 9:29 AM   [facility] [address], [place], [place], [address] Phone: [phone]   Fax:  [phone]  Name: Jacob Kerr MRN: [id number] Date of Birth: 1950/12/25

## 2019-08-07 ENCOUNTER — Encounter: Payer: Self-pay | Admitting: Physical Therapy

## 2019-08-07 ENCOUNTER — Ambulatory Visit: Payer: Medicare Other | Admitting: Physical Therapy

## 2019-08-07 ENCOUNTER — Other Ambulatory Visit: Payer: Self-pay

## 2019-08-07 DIAGNOSIS — M6281 Muscle weakness (generalized): Secondary | ICD-10-CM | POA: Diagnosis not present

## 2019-08-07 DIAGNOSIS — R278 Other lack of coordination: Secondary | ICD-10-CM

## 2019-08-07 DIAGNOSIS — M546 Pain in thoracic spine: Secondary | ICD-10-CM

## 2019-08-07 DIAGNOSIS — G8929 Other chronic pain: Secondary | ICD-10-CM

## 2019-08-07 DIAGNOSIS — R252 Cramp and spasm: Secondary | ICD-10-CM

## 2019-08-07 NOTE — Therapy (Signed)
[facility] [address] [place], [place], [address] Phone: [phone]   Fax:  [phone]  Physical Therapy Treatment  Patient Details  Name: Jacob Kerr MRN: [id number] Date of Birth: 12-04-1950 Referring Provider (PT): Dr. Ronnie Doss   Encounter Date: 08/07/2019  PT End of Session - 08/07/19 0900    Visit Number  18    Date for PT Re-Evaluation  09/04/19    Authorization Type  [employer]HC Medicare    PT Start Time  0845    PT Stop Time  0925    PT Time Calculation (min)  40 min    Activity Tolerance  Patient tolerated treatment well;No increased pain    Behavior During Therapy  WFL for tasks assessed/performed       Past Medical History:  Diagnosis Date  . Acute meniscal tear of left knee   . Allergic rhinitis   . Anal fissure   . Anxiety   . BPH (benign prostatic hypertrophy)   . Cervical spondylosis without myelopathy   . Cervicogenic headache   . Chronic headaches   . Chronic neck pain    uses TENS unit  prn  . Diverticulitis   . Gallstones   . GERD (gastroesophageal reflux disease)   . H/O hiatal hernia   . History of diverticulitis   . History of kidney stones   . History of TIA (transient ischemic attack)    04/ 2011---  no residuals  . Hypertension   . Hypothyroidism   . Peripheral neuropathy   . Shingles   . Syrinx of spinal cord ([place])    C4 -- C7    Past Surgical History:  Procedure Laterality Date  . CARDIAC CATHETERIZATION  02-18-2010   dr Angelena Form   normal coronary arteries/  normal lvsf--  ef 65%  . COLONOSCOPY    . [place]  . HERNIA REPAIR     [facility] [place] [facility]  . KNEE ARTHROSCOPY Left 03/13/2014   Procedure: LEFT ARTHROSCOPY KNEE WITH DEBRIDMENT;  Surgeon: Gearlean Alf, MD;  Location: [facility];  Service: Orthopedics;  Laterality: Left;  . LAPAROSCOPIC CHOLECYSTECTOMY  01-30-2003  . NEGATIVE SLEEP STUDY  2013   per pt  .  RADIOFREQUENCY ABLATION NERVES  05/2016  . REMOVAL FORGEIN BODY FROM EAR  AGE 61  . RIGHT URETEROSCOPIC LASER LITHOTRIPSY STONE EXTRACTION /  STENT PLACEMENT  05-10-2000  . TRANSTHORACIC ECHOCARDIOGRAM  01-16-2010   normal lvf/  ef  60%/  mild lae    There were no vitals filed for this visit.  Subjective Assessment - 08/07/19 0855    Subjective  I stumbled on limbs and I fell.    Pertinent History  Peripheral neuropathy; headaches; left knee scope; neck pain; TIA.    Patient Stated Goals  reduce pelvic pain, go hunting or hiking without worrying about incontinence, keep up with 68 year old grandson    Currently in Pain?  Yes    Pain Score  6     Pain Location  Thoracic    Pain Orientation  Right;Left    Pain Descriptors / Indicators  Shooting;Stabbing;Discomfort    Pain Type  Chronic pain    Pain Onset  More than a month ago    Pain Frequency  Constant    Aggravating Factors   movement    Pain Relieving Factors  hot water, heating pad, TENS uit    Multiple Pain Sites  No  [place] Adult PT Treatment/Exercise - 08/07/19 0001      Lumbar Exercises: Aerobic   Nustep  7  minutes; level 2; while assessing patient      Lumbar Exercises: Standing   Other Standing Lumbar Exercises  pelvic floor elevator contraction with abdominal bracing with back against the wall      Lumbar Exercises: Supine   Bent Knee Raise  10 reps   each side with abdominal bracing   Dead Bug  20 reps;1 second    Dead Bug Limitations  with abdominal bracing      Knee/Hip Exercises: Sidelying   Other Sidelying Knee/Hip Exercises  lay on side and rotate upper trunk with over pressure from the therapist to work on rib movement and posture for better pelvic floor contraction      Manual Therapy   Manual Therapy  Joint mobilization;Soft tissue mobilization    Joint Mobilization  mid and lower rib cage mobilization with breath to work on downward movement; P-A and rotational  movement to T3-T12 to work on breathing without pain stopping and elongation of the diaphragm    Soft tissue mobilization  soft tissue work to thoracic paraspinals               PT Short Term Goals - 06/06/19 1343      PT SHORT TERM GOAL #4   Title  pain after ejaculation decreased >/= 25%    Baseline  has not had intercourse lately    Time  4    Period  Weeks    Status  Achieved    Target Date  04/26/19        PT Long Term Goals - 08/07/19 0858      PT LONG TERM GOAL #1   Title  Independent with a HEP.    Time  8    Period  Weeks    Status  On-going      PT LONG TERM GOAL #2   Title  able to sit with pain level improved >/= 75% due to ability to relax the pelvic floor    Time  8    Period  Weeks    Status  Achieved      PT LONG TERM GOAL #3   Title  pain after ejaculation decreased >/= 75%    Time  8    Period  Weeks    Status  Achieved      PT LONG TERM GOAL #4   Title  able to initiate a urine stream and bowel movement without difficulty    Time  8    Period  Weeks    Status  Achieved      PT LONG TERM GOAL #6   Title  urgency to have a bowel movement decreased >/= 80% so he is able to get to the bathroom in time.    Baseline  went to the woods one time due to urgency    Time  8    Period  Weeks    Status  On-going            Plan - 08/07/19 0900    Clinical Impression Statement  Patient only had one instance of urgency and had to go to the bathroom in the woods since last week. Patient is not having rectal pain. Patient has not had urinary leakage. Patient has improved rib mobility after manual work so he is able to engage the abdominals better for core work and increased  pelvic floor contraction. Patient is able to bulge the pelvic floor better due to improved rib and thoracic movement. Patient will benefit from skilled therapy to work on abdominal contraction and reducing urgency.    Personal Factors and Comorbidities  Age;Past/Current  Experience;Comorbidity 1;Sex;Comorbidity 2;Comorbidity 3+;Time since onset of injury/illness/exacerbation    Comorbidities  peripheral neuropath, headaches; left knee scope, neck pain, TIA    Examination-Activity Limitations  Sit;Continence    Examination-Participation Restrictions  Community Activity    Stability/Clinical Decision Making  Evolving/Moderate complexity    Rehab Potential  Excellent    PT Frequency  1x / week    PT Duration  8 weeks    PT Treatment/Interventions  ADLs/Self Care Home Management;Biofeedback;Cryotherapy;Electrical Stimulation;Moist Heat;Therapeutic exercise;Therapeutic activities;Neuromuscular re-education;Patient/family education;Dry needling;Passive range of motion;Manual techniques;Spinal Manipulations    PT Next Visit Plan  work on core control, work on rib mobility; possible discharge    PT Home Exercise Plan  Access Code: [email or web address]    Consulted and Agree with Plan of Care  Patient       Patient will benefit from skilled therapeutic intervention in order to improve the following deficits and impairments:  Decreased coordination, Increased fascial restricitons, Decreased endurance, Increased muscle spasms, Decreased activity tolerance, Pain, Hypomobility, Decreased mobility, Decreased strength  Visit Diagnosis: Muscle weakness (generalized)  Cramp and spasm  Chronic bilateral low back pain with right-sided sciatica  Other lack of coordination  Pain in thoracic spine     Problem List Patient Active Problem List   Diagnosis Date Noted  . Anxiety 10/04/2017  . Nonallopathic lesion of rib cage 06/15/2017  . Nonallopathic lesion of cervical region 05/18/2017  . Nonallopathic lesion of thoracic region 05/18/2017  . Nonallopathic lesion of lumbosacral region 05/18/2017  . Arrhythmia 10/08/2014  . TIA (transient ischemic attack) 08/09/2014  . Lateral meniscal tear 03/12/2014  . Testosterone deficiency 07/04/2013  . Cervicogenic headache 03/28/2013   . Vitamin D deficiency 01/01/2013  . Hyperlipidemia 01/01/2013  . BPH (benign prostatic hyperplasia) 11/03/2012  . Diverticulosis large intestine w/o perforation or abscess w/bleeding 11/03/2012  . Peripheral neuropathy 11/03/2012  . Hypothyroidism 01/18/2011  . Essential hypertension 01/18/2011  . GERD (gastroesophageal reflux disease) 01/18/2011  . Cervical spondylosis without myelopathy 11/24/2010  . Headache(784.0) 11/24/2010  . Sleep disturbance, unspecified 11/24/2010  . Syringomyelia and syringobulbia ([place]) 11/24/2010  . Disturbance of skin sensation 11/24/2010  . Cerebrovascular disease, unspecified 11/24/2010   Earlie Counts, PT 08/07/19 9:30 AM   [facility] [address] [place], [place], [address] Phone: [phone]   Fax:  [phone]  Name: HORALD BAJO MRN: [id number] Date of Birth: 1951-03-08

## 2019-08-13 ENCOUNTER — Encounter: Payer: Self-pay | Admitting: Physical Therapy

## 2019-08-13 ENCOUNTER — Other Ambulatory Visit: Payer: Self-pay

## 2019-08-13 ENCOUNTER — Ambulatory Visit: Payer: Medicare Other | Admitting: Physical Therapy

## 2019-08-13 DIAGNOSIS — M6281 Muscle weakness (generalized): Secondary | ICD-10-CM

## 2019-08-13 DIAGNOSIS — R252 Cramp and spasm: Secondary | ICD-10-CM

## 2019-08-13 DIAGNOSIS — R278 Other lack of coordination: Secondary | ICD-10-CM

## 2019-08-13 DIAGNOSIS — G8929 Other chronic pain: Secondary | ICD-10-CM

## 2019-08-13 DIAGNOSIS — M546 Pain in thoracic spine: Secondary | ICD-10-CM

## 2019-08-13 NOTE — Therapy (Signed)
[facility] [address] [place], [place], [address] Phone: [phone]   Fax:  [phone]  Physical Therapy Treatment  Patient Details  Name: Jacob Kerr MRN: [id number] Date of Birth: [birth year] Referring Provider (PT): Dr. Ronnie Doss   Encounter Date: 08/13/2019  PT End of Session - 08/13/19 0855    Visit Number  19    Date for PT Re-Evaluation  09/04/19    Authorization Type  [employer] Medicare    PT Start Time  3085   came late   PT Stop Time  0930    PT Time Calculation (min)  40 min    Activity Tolerance  Patient tolerated treatment well;No increased pain    Behavior During Therapy  WFL for tasks assessed/performed       Past Medical History:  Diagnosis Date  . Acute meniscal tear of left knee   . Allergic rhinitis   . Anal fissure   . Anxiety   . BPH (benign prostatic hypertrophy)   . Cervical spondylosis without myelopathy   . Cervicogenic headache   . Chronic headaches   . Chronic neck pain    uses TENS unit  prn  . Diverticulitis   . Gallstones   . GERD (gastroesophageal reflux disease)   . H/O hiatal hernia   . History of diverticulitis   . History of kidney stones   . History of TIA (transient ischemic attack)    04/ 2011---  no residuals  . Hypertension   . Hypothyroidism   . Peripheral neuropathy   . Shingles   . Syrinx of spinal cord ([place])    C4 -- C7    Past Surgical History:  Procedure Laterality Date  . CARDIAC CATHETERIZATION  02-18-2010   dr Angelena Form   normal coronary arteries/  normal lvsf--  ef 65%  . COLONOSCOPY    . [place]  . HERNIA REPAIR     [facility] [place] [facility]  . KNEE ARTHROSCOPY Left 03/13/2014   Procedure: LEFT ARTHROSCOPY KNEE WITH DEBRIDMENT;  Surgeon: Gearlean Alf, MD;  Location: [facility];  Service: Orthopedics;  Laterality: Left;  . LAPAROSCOPIC CHOLECYSTECTOMY  01-30-2003  . NEGATIVE SLEEP STUDY  2013    per pt  . RADIOFREQUENCY ABLATION NERVES  05/2016  . REMOVAL FORGEIN BODY FROM EAR  AGE 39  . RIGHT URETEROSCOPIC LASER LITHOTRIPSY STONE EXTRACTION /  STENT PLACEMENT  05-10-2000  . TRANSTHORACIC ECHOCARDIOGRAM  01-16-2010   normal lvf/  ef  60%/  mild lae    There were no vitals filed for this visit.  Subjective Assessment - 08/13/19 0853    Subjective  I am doing well. I have not fallen since the last visit.    Pertinent History  Peripheral neuropathy; headaches; left knee scope; neck pain; TIA.    Patient Stated Goals  reduce pelvic pain, go hunting or hiking without worrying about incontinence, keep up with 45 year old grandson    Currently in Pain?  Yes    Pain Score  6     Pain Location  Thoracic    Pain Orientation  Right;Left    Pain Descriptors / Indicators  Shooting;Stabbing;Discomfort    Pain Onset  More than a month ago    Pain Frequency  Constant    Aggravating Factors   movement    Pain Relieving Factors  hot water, heating pad, TENS unit    Multiple Pain Sites  No  [facility] PT Assessment - 08/13/19 0001      Assessment   Medical Diagnosis  R10.2 Pelvic Pain    Referring Provider (PT)  Dr. Ronnie Doss    Prior Therapy  for back      Precautions   Precautions  None      Restrictions   Weight Bearing Restrictions  No      Home Environment   Living Environment  Private residence      Prior Function   Level of Independence  Independent    Leisure  stretches, walking, up and down tractor      Cognition   Overall Cognitive Status  Within Functional Limits for tasks assessed      ROM / Strength   AROM / PROM / Strength  AROM;PROM;Strength      AROM   Lumbar Flexion  full    Lumbar Extension  full    Lumbar - Right Side Bend  full    Lumbar - Left Side Bend  full    Lumbar - Right Rotation  full    Lumbar - Left Rotation  full      Strength   Right Hip ABduction  5/5    Left Hip Flexion  5/5    Left Hip ABduction  5/5                 Pelvic Floor Special Questions - 08/13/19 0001    Urinary Leakage  No    Fecal incontinence  No    Pelvic Floor Internal Exam  Patient confirms identification and approves PT to assess muscles and treatment    Exam Type  Rectal    Palpation  tenderness located in bil. obturator internist    Strength  good squeeze, good lift, able to hold agaisnt strong resistance        [facility] Adult PT Treatment/Exercise - 08/13/19 0001      Lumbar Exercises: Supine   Dead Bug  20 reps;1 second    Dead Bug Limitations  with abdominal bracing    Bridge with March  20 reps;1 second      Lumbar Exercises: Quadruped   Opposite Arm/Leg Raise  Right arm/Left leg;Left arm/Right leg;10 reps    Opposite Arm/Leg Raise Limitations  VC to tighten the abdominals      Manual Therapy   Manual Therapy  Joint mobilization;Soft tissue mobilization    Joint Mobilization  right rib cage mobilization to improve diaphrgmatic breathing    Soft tissue mobilization  usng the addaday t odo soft tissue work to the lumbar and thoracic paraspinals       Trigger Point Dry Needling - 08/13/19 0001    Consent Given?  Yes    Education Handout Provided  Previously provided    Muscles Treated Back/Hip  Lumbar multifidi   right   Other Dry Needling  T5-T12   right   Lumbar multifidi Response  Twitch response elicited;Palpable increased muscle length             PT Short Term Goals - 06/06/19 1343      PT SHORT TERM GOAL #4   Title  pain after ejaculation decreased >/= 25%    Baseline  has not had intercourse lately    Time  4    Period  Weeks    Status  Achieved    Target Date  04/26/19        PT Long Term Goals - 08/13/19 0920  PT LONG TERM GOAL #1   Title  Independent with a HEP.    Time  8    Period  Weeks    Status  Achieved      PT LONG TERM GOAL #2   Title  able to sit with pain level improved >/= 75% due to ability to relax the pelvic floor    Time  8    Period   Weeks    Status  Achieved      PT LONG TERM GOAL #3   Title  pain after ejaculation decreased >/= 75%    Time  8    Period  Weeks      PT LONG TERM GOAL #4   Title  able to initiate a urine stream and bowel movement without difficulty    Time  8    Period  Weeks    Status  Achieved      PT LONG TERM GOAL #5   Title  able to play with his 83 year old grandson with minimal to no pain due to improve mobilty and decreased in muscle spasms    Time  8    Period  Weeks    Status  Achieved      PT LONG TERM GOAL #6   Title  urgency to have a bowel movement decreased >/= 80% so he is able to get to the bathroom in time.    Period  Weeks    Status  Achieved            Plan - 08/13/19 0900    Clinical Impression Statement  Patient  has met all of his goals. Patient has full lumbar ROM. Patient is able to perform abdominal exercises with abdominal bracing and preventing doming of the abdomen. Patient has full strength of LE. Patient has no urinary or fecal leakage. Patient is able to control the urge to have a bowel movement using his exercises. Patient is able to fully empty his bladder now. Patient is independent with his HEP.    Personal Factors and Comorbidities  Age;Past/Current Experience;Comorbidity 1;Sex;Comorbidity 2;Comorbidity 3+;Time since onset of injury/illness/exacerbation    Comorbidities  peripheral neuropath, headaches; left knee scope, neck pain, TIA    Examination-Activity Limitations  Sit;Continence    Examination-Participation Restrictions  Community Activity    Stability/Clinical Decision Making  Evolving/Moderate complexity    Rehab Potential  Excellent    PT Treatment/Interventions  ADLs/Self Care Home Management;Biofeedback;Cryotherapy;Electrical Stimulation;Moist Heat;Therapeutic exercise;Therapeutic activities;Neuromuscular re-education;Patient/family education;Dry needling;Passive range of motion;Manual techniques;Spinal Manipulations    PT Next Visit Plan   discharge to HEP    PT Home Exercise Plan  Access Code: [email or web address]    Consulted and Agree with Plan of Care  Patient       Patient will benefit from skilled therapeutic intervention in order to improve the following deficits and impairments:  Decreased coordination, Increased fascial restricitons, Decreased endurance, Increased muscle spasms, Decreased activity tolerance, Pain, Hypomobility, Decreased mobility, Decreased strength  Visit Diagnosis: Muscle weakness (generalized)  Cramp and spasm  Chronic bilateral low back pain with right-sided sciatica  Other lack of coordination  Pain in thoracic spine     Problem List Patient Active Problem List   Diagnosis Date Noted  . Anxiety 10/04/2017  . Nonallopathic lesion of rib cage 06/15/2017  . Nonallopathic lesion of cervical region 05/18/2017  . Nonallopathic lesion of thoracic region 05/18/2017  . Nonallopathic lesion of lumbosacral region 05/18/2017  . Arrhythmia 10/08/2014  . TIA (transient  ischemic attack) 08/09/2014  . Lateral meniscal tear 03/12/2014  . Testosterone deficiency 07/04/2013  . Cervicogenic headache 03/28/2013  . Vitamin D deficiency 01/01/2013  . Hyperlipidemia 01/01/2013  . BPH (benign prostatic hyperplasia) 11/03/2012  . Diverticulosis large intestine w/o perforation or abscess w/bleeding 11/03/2012  . Peripheral neuropathy 11/03/2012  . Hypothyroidism 01/18/2011  . Essential hypertension 01/18/2011  . GERD (gastroesophageal reflux disease) 01/18/2011  . Cervical spondylosis without myelopathy 11/24/2010  . Headache(784.0) 11/24/2010  . Sleep disturbance, unspecified 11/24/2010  . Syringomyelia and syringobulbia ([place]) 11/24/2010  . Disturbance of skin sensation 11/24/2010  . Cerebrovascular disease, unspecified 11/24/2010    Earlie Counts, PT 08/13/19 9:29 AM   [facility] [address], [place], [place], [address] Phone: [phone]    Fax:  [phone]  Name: Jacob Kerr MRN: [id number] Date of Birth: January 12, 1951  PHYSICAL THERAPY DISCHARGE SUMMARY  Visits from Start of Care: 19  Current functional level related to goals / functional outcomes: See above.    Remaining deficits: See above.    Education / Equipment: HEP Plan: Patient agrees to discharge.  Patient goals were met. Patient is being discharged due to meeting the stated rehab goals.  Thank you for the referral. Earlie Counts, PT 08/13/19 9:29 AM  ?????

## 2019-08-15 ENCOUNTER — Other Ambulatory Visit: Payer: Self-pay | Admitting: Family Medicine

## 2019-08-15 DIAGNOSIS — E034 Atrophy of thyroid (acquired): Secondary | ICD-10-CM

## 2019-08-17 ENCOUNTER — Other Ambulatory Visit: Payer: Self-pay | Admitting: Family Medicine

## 2019-08-20 ENCOUNTER — Ambulatory Visit: Payer: Medicare Other | Admitting: Physical Therapy

## 2019-08-27 ENCOUNTER — Encounter: Payer: Medicare Other | Admitting: Physical Therapy

## 2019-10-08 ENCOUNTER — Encounter: Payer: Self-pay | Admitting: Family Medicine

## 2019-10-10 DIAGNOSIS — G8929 Other chronic pain: Secondary | ICD-10-CM | POA: Insufficient documentation

## 2019-10-10 DIAGNOSIS — M542 Cervicalgia: Secondary | ICD-10-CM | POA: Insufficient documentation

## 2019-10-11 ENCOUNTER — Other Ambulatory Visit: Payer: Self-pay | Admitting: Student

## 2019-10-11 ENCOUNTER — Other Ambulatory Visit (HOSPITAL_COMMUNITY): Payer: Self-pay | Admitting: Student

## 2019-10-11 DIAGNOSIS — G8929 Other chronic pain: Secondary | ICD-10-CM

## 2019-10-12 ENCOUNTER — Other Ambulatory Visit (HOSPITAL_COMMUNITY): Payer: Self-pay | Admitting: Student

## 2019-10-12 DIAGNOSIS — M542 Cervicalgia: Secondary | ICD-10-CM

## 2019-10-12 DIAGNOSIS — G8929 Other chronic pain: Secondary | ICD-10-CM

## 2019-10-23 ENCOUNTER — Ambulatory Visit (HOSPITAL_COMMUNITY)
Admission: RE | Admit: 2019-10-23 | Discharge: 2019-10-23 | Disposition: A | Discharge status: Home / Self Care | Payer: Medicare Other | Source: Ambulatory Visit | Attending: Student | Admitting: Student

## 2019-10-23 ENCOUNTER — Other Ambulatory Visit: Payer: Self-pay

## 2019-10-23 DIAGNOSIS — G8929 Other chronic pain: Secondary | ICD-10-CM | POA: Diagnosis present

## 2019-10-23 DIAGNOSIS — M542 Cervicalgia: Secondary | ICD-10-CM | POA: Diagnosis not present

## 2019-10-23 LAB — POCT I-STAT CREATININE: Creatinine, Ser: 1.2 mg/dL (ref 0.61–1.24)

## 2019-10-23 MED ORDER — GADOBUTROL 1 MMOL/ML IV SOLN
10.0000 mL | Freq: Once | INTRAVENOUS | Status: AC | PRN
  Administered 2019-10-23: 10 mL via INTRAVENOUS

## 2019-10-25 ENCOUNTER — Encounter: Payer: Self-pay | Admitting: Family Medicine

## 2019-10-30 ENCOUNTER — Other Ambulatory Visit: Payer: Self-pay | Admitting: Family Medicine

## 2019-10-30 DIAGNOSIS — Z8589 Personal history of malignant neoplasm of other organs and systems: Secondary | ICD-10-CM

## 2019-10-30 DIAGNOSIS — M898X9 Other specified disorders of bone, unspecified site: Secondary | ICD-10-CM

## 2019-10-30 NOTE — Progress Notes (Signed)
Patient with chronic bone pain.  He has been seen by neurosurgery and orthopedics in the past and had MRIs that did not show any advancing degenerative changes.  He is worried about an unusual bone cancer and is requesting PET scan.  We discussed that PET scan is not necessarily appropriate in his situation but I would be glad to refer him to a hematologist/oncologist for further evaluation and discussion.  He has no findings on CBC to suggest chronic inflammation.  White blood cell counts, platelets have been normal.  Medical history significant for squamous cell carcinoma in situ.  MRI of brain, C-spine and thoracic spine have not shown any concerning findings to suggest metastases or other malignancy.

## 2019-11-01 ENCOUNTER — Telehealth: Payer: Self-pay | Admitting: Hematology

## 2019-11-01 NOTE — Telephone Encounter (Signed)
Received a new pt referral from Dr. Lajuana Ripple for bone pain. [name] has been cld and scheduled to see Dr. Irene Limbo on 2/11 at 11am. Pt aware to arrive 15 minutes early.

## 2019-11-06 ENCOUNTER — Other Ambulatory Visit: Payer: Self-pay | Admitting: Family Medicine

## 2019-11-07 NOTE — Progress Notes (Signed)
HEMATOLOGY/ONCOLOGY CONSULTATION NOTE  Date of Service: 11/08/2019  Patient Care Team: Janora Norlander, DO as PCP - General (Family Medicine) Luna Kitchens., MD (Neurosurgery) Sable Feil, MD (Gastroenterology) Kathrynn Ducking, MD (Neurology) Edrick Oh, MD (Nephrology) Minus Breeding, MD as Consulting Physician (Cardiology) Janora Norlander, DO as Consulting Physician (Family Medicine)  REFERRING PHYSICIAN: Janora Norlander, DO  CHIEF COMPLAINTS/PURPOSE OF CONSULTATION:  Bone Pain   HISTORY OF PRESENTING ILLNESS:   Jacob Kerr is a wonderful 69 y.o. male who has been referred to [place] by Janora Norlander, DO for evaluation and management of Bone Pain.The pt reports that he is doing well overall.   The pt reports that he has right should blade pain and right rib pain. He also has random bone pain in his hips.  June 2015 he fell going down hill and it effected his right side.  He has had several imaging studies since his fall that has not revealed a cause to his pain.  He has noticed that he has some trouble controlling his bladder. He also experiences bowel issues such diarrhea and constipation. He has tried pelvic floor therapy.   He has seen a neurologist and had a nerve ablation with no improvement.  He has had some cutaneous squamous cell carcinoma removed.  He has scoliosis in his spine.  He has been to a pain clinic but does not want to take the pain medications due to the side effects.    He states that he is drinking a fair amount of ETOH to sleep. He did not start drinking since 2015.  He experiences some pain his big toe that feels as if someone is drilling a nail through his toe.  He has tried never medications that did not help.  He has noticed that there as been a 5-10 lb radiance in his weight.  He has tried yoga and lays on a bed of nails every night.  He has also tried dry needling treatments.  He is using a TENS  that helps temporarily.  He has never had a bone density test and he is not taking Vitamin D.  No localized back pain.  He feels something growing under his right shoulder blade.   Most recent lab results (03/28/2019) of CBC is as follows: all values are WNL except for MCV at 100, MCH at 34.3, Glucose at 113, BUN/Creatinine Ratio at 9.  On review of systems, pt reports pain over the right should blade and pain in the right ribs, uncontrollable bowels, and denies abdominal pain and any other symptoms.   On Social Hx the pt reports never smoker. He began drinking 2015. Currently drinking a half of a gallon of crown royal whisky.  On Family Hx the pt reports that his sister had a cancerous tumor behind her left eye. She passed from this cancer at 66. He has another sister who passed away from carcinoma.   MEDICAL HISTORY:  Past Medical History:  Diagnosis Date  . Acute meniscal tear of left knee   . Allergic rhinitis   . Anal fissure   . Anxiety   . BPH (benign prostatic hypertrophy)   . Cervical spondylosis without myelopathy   . Cervicogenic headache   . Chronic headaches   . Chronic neck pain    uses TENS unit  prn  . Diverticulitis   . Gallstones   . GERD (gastroesophageal reflux disease)   . H/O hiatal hernia   .  History of diverticulitis   . History of kidney stones   . History of TIA (transient ischemic attack)    04/ 2011---  no residuals  . Hypertension   . Hypothyroidism   . Peripheral neuropathy   . Shingles   . Syrinx of spinal cord ([place])    C4 -- C7     SURGICAL HISTORY: Past Surgical History:  Procedure Laterality Date  . CARDIAC CATHETERIZATION  02-18-2010   dr Angelena Form   normal coronary arteries/  normal lvsf--  ef 65%  . COLONOSCOPY    . [place]  . HERNIA REPAIR     [facility]  . KNEE ARTHROSCOPY Left 03/13/2014   Procedure: LEFT ARTHROSCOPY KNEE WITH DEBRIDMENT;  Surgeon: Gearlean Alf, MD;  Location: [facility];  Service: Orthopedics;  Laterality: Left;  . LAPAROSCOPIC CHOLECYSTECTOMY  01-30-2003  . NEGATIVE SLEEP STUDY  2013   per pt  . RADIOFREQUENCY ABLATION NERVES  05/2016  . REMOVAL FORGEIN BODY FROM EAR  AGE 45  . RIGHT URETEROSCOPIC LASER LITHOTRIPSY STONE EXTRACTION /  STENT PLACEMENT  05-10-2000  . TRANSTHORACIC ECHOCARDIOGRAM  01-16-2010   normal lvf/  ef  60%/  mild lae     SOCIAL HISTORY: Social History   Socioeconomic History  . Marital status: Married    Spouse name: Not on file  . Number of children: 2  . Years of education: 63  . Highest education level: Not on file  Occupational History  . Occupation: Retired    Fish farm manager: Chief of Staff  Tobacco Use  . Smoking status: Never Smoker  . Smokeless tobacco: Never Used  Substance and Sexual Activity  . Alcohol use: Yes    Alcohol/week: 7.0 standard drinks    Types: 7 Shots of liquor per week    Comment: 2 at night  . Drug use: No  . Sexual activity: Not on file  Other Topics Concern  . Not on file  Social History Narrative   Lives at home with wife.       Patient is right handed.   Patient drinks 1-2 cups of caffeine daily.   Social Determinants of Health   Financial Resource Strain: Low Risk   . Difficulty of Paying Living Expenses: Not hard at all  Food Insecurity: No Food Insecurity  . Worried About Charity fundraiser in the Last Year: Never true  . Ran Out of Food in the Last Year: Never true  Transportation Needs: No Transportation Needs  . Lack of Transportation (Medical): No  . Lack of Transportation (Non-Medical): No  Physical Activity: Sufficiently Active  . Days of Exercise per Week: 6 days  . Minutes of Exercise per Session: 30 min  Stress: No Stress Concern Present  . Feeling of Stress : Only a little  Social Connections: Somewhat Isolated  . Frequency of Communication with Friends and Family: More than three times a week  . Frequency of Social Gatherings with Friends and Family:  More than three times a week  . Attends Religious Services: Never  . Active Member of Clubs or Organizations: No  . Attends Archivist Meetings: Never  . Marital Status: Married  Human resources officer Violence: Not At Risk  . Fear of Current or Ex-Partner: No  . Emotionally Abused: No  . Physically Abused: No  . Sexually Abused: No     FAMILY HISTORY: Family History  Problem Relation Age of Onset  . Prostate cancer Father   .  Heart disease Father   . Stroke Father   . Nephrolithiasis Sister   . Cancer Sister        Intestinal  . Hyperlipidemia Sister   . Hyperlipidemia Sister   . Cancer Sister        Sinus  . COPD Other   . Colon polyps Brother        x2  . Colon polyps Sister   . Colon cancer Neg Hx   . Esophageal cancer Neg Hx      ALLERGIES:   is allergic to cymbalta [duloxetine hcl]; lyrica [pregabalin]; neurontin [gabapentin]; robaxin [methocarbamol]; sudafed [pseudoephedrine hcl]; and epinephrine.   MEDICATIONS:  Current Outpatient Medications  Medication Sig Dispense Refill  . amLODipine (NORVASC) 10 MG tablet TAKE 1 TABLET BY MOUTH  DAILY 90 tablet 1  . aspirin 81 MG tablet Take 81 mg by mouth at bedtime.     Marland Kitchen azelastine (ASTELIN) 0.1 % nasal spray PLACE 2 SPRAYS INTO BOTH NOSTRILS 2 (TWO) TIMES DAILY. (Patient taking differently: Place 2 sprays into both nostrils as needed. ) 30 mL 5  . baclofen (LIORESAL) 10 MG tablet Take 1 tablet (10 mg total) by mouth 2 (two) times daily. 180 each 1  . Coenzyme Q10 (CO Q 10) 100 MG CAPS Take 1 capsule by mouth daily.    Marland Kitchen levothyroxine (SYNTHROID) 150 MCG tablet Take 1 tablet (150 mcg total) by mouth daily before breakfast. Needs to be seen for further refills. 90 tablet 0  . LORazepam (ATIVAN) 0.5 MG tablet Take 1 tablet (0.5 mg total) by mouth 2 (two) times daily as needed for anxiety. (Patient taking differently: Take 0.5 mg by mouth as needed for anxiety. ) 30 tablet 0  . losartan (COZAAR) 50 MG tablet TAKE 1  TABLET BY MOUTH  DAILY 90 tablet 1  . NON FORMULARY Take 3 capsules by mouth every morning. Nugenix Testosterone Booster / [place]     . omeprazole (PRILOSEC) 20 MG capsule Take 1 capsule (20 mg total) by mouth 2 (two) times daily. (Patient taking differently: Take 20 mg by mouth daily. ) 180 capsule 1  . UNABLE TO FIND Med Name: CBD oil    . venlafaxine XR (EFFEXOR-XR) 75 MG 24 hr capsule TAKE 1 CAPSULE BY MOUTH  DAILY WITH BREAKFAST 90 capsule 1   No current facility-administered medications for this visit.     REVIEW OF SYSTEMS:   A 10+ POINT REVIEW OF SYSTEMS WAS OBTAINED including neurology, dermatology, psychiatry, cardiac, respiratory, lymph, extremities, GI, GU, Musculoskeletal, constitutional, breasts, reproductive, HEENT.  All pertinent positives are noted in the HPI.  All others are negative.    PHYSICAL EXAMINATION: ECOG PERFORMANCE STATUS: 2 - Symptomatic, <50% confined to bed  Vitals:   11/08/19 1218  BP: (!) 158/92  Pulse: 76  Resp: 17  Temp: 98 F (36.7 C)  SpO2: 99%   Filed Weights   11/08/19 1218  Weight: 205 lb 4.8 oz (93.1 kg)   Body mass index is 27.84 kg/m.  GENERAL:alert, in no acute distress and comfortable SKIN: no acute rashes, no significant lesions EYES: conjunctiva are pink and non-injected, sclera anicteric OROPHARYNX: MMM, no exudates, no oropharyngeal erythema or ulceration NECK: supple, no JVD LYMPH:  no palpable lymphadenopathy in the cervical, axillary or inguinal regions LUNGS: clear to auscultation b/l with normal respiratory effort HEART: regular rate & rhythm ABDOMEN:  normoactive bowel sounds , non tender, not distended. Extremity: no pedal edema PSYCH: alert & oriented x 3 with fluent  speech NEURO: no focal motor/sensory deficits SPINE: curvature    LABORATORY DATA:  I have reviewed the data as listed  CBC Latest Ref Rng & Units 03/28/2019 06/09/2018 06/09/2018  WBC 3.4 - 10.8 x10E3/uL 6.1 - 6.1  Hemoglobin 13.0 - 17.7 g/dL 17.2  17.7(H) 17.6(H)  Hematocrit 37.5 - 51.0 % 50.2 52.0 50.9  Platelets 150 - 450 x10E3/uL 227 - 222    RADIOGRAPHIC STUDIES: I have personally reviewed the radiological images as listed and agreed with the findings in the report. MR CERVICAL SPINE W WO CONTRAST  Result Date: 10/23/2019 CLINICAL DATA:  Chronic neck pain. Follow-up syrinx. EXAM: MRI CERVICAL SPINE WITHOUT AND WITH CONTRAST TECHNIQUE: Multiplanar and multiecho pulse sequences of the cervical spine, to include the craniocervical junction and cervicothoracic junction, were obtained without and with intravenous contrast. CONTRAST:  35m GADAVIST GADOBUTROL 1 MMOL/ML IV SOLN COMPARISON:  06/10/2018 FINDINGS: The study is mildly motion degraded. Alignment: Straightening of the normal lordosis in the upper cervical spine. No listhesis. Vertebrae: No fracture, suspicious osseous lesion, or significant marrow edema. Cord: Unchanged syrinx extending from the superior C5 to mid C7 levels with maximal diameter 3 mm at C6. No abnormal enhancement. Posterior Fossa, vertebral arteries, paraspinal tissues: Unremarkable. Disc levels: C2-3: Mild facet arthrosis without disc herniation or stenosis, unchanged. C3-4: Small central disc protrusion and mild uncovertebral spurring without significant stenosis or significant spinal cord mass effect, unchanged. C4-5: Disc bulging and right greater than left uncovertebral spurring result in moderate right neural foraminal stenosis without spinal stenosis, unchanged. C5-6: Disc bulging and left greater than right uncovertebral spurring result in mild left neural foraminal stenosis without spinal stenosis, unchanged. C6-7: Minimal disc bulging, minimal uncovertebral spurring, and mild left facet arthrosis result in borderline left neural foraminal stenosis without spinal stenosis, unchanged. C7-T1: Negative. IMPRESSION: 1. Unchanged small syrinx in the lower cervical spinal cord. 2. Unchanged cervical disc degeneration  without spinal stenosis. 3. Moderate right neural foraminal stenosis at C4-5. Electronically Signed   By: ALogan BoresM.D.   On: 10/23/2019 10:57     ASSESSMENT & PLAN:   Jacob SANTAis a 69y.o. male with:  1. Persistent generalized bone pains and back pains - ? Related to scoliosis. 2. Chest wall pain ? From trauma vs other etiology 3. 3. Polycythemia -  R/o PCV  PLAN: -Discussed patient's most recent labs from 03/28/2019, all values are WNL except for MCV at 100, MCH at 34.3, Glucose at 113, BUN/Creatinine Ratio at 9. -Discussed that his symptoms sound muscular skeletal., back pain could be from scoliosis -Discussed that he blood work can be done and a bone scan. -Will order bone scan and possible chest ct -Will have labs today.   . Orders Placed This Encounter  Procedures  . CT Chest Wo Contrast    Standing Status:   Future    Standing Expiration Date:   11/07/2020    Order Specific Question:   ** REASON FOR EXAM (FREE TEXT)    Answer:   rt sided chest wall pain - severe. remote h/o fall with trauma    Order Specific Question:   [facility]    [facility]   Order Specific Question:   Radiology Contrast Protocol - do NOT remove file path    Answer:   [email or web address]  . DG Bone Survey Met    Standing Status:   Future    Standing Expiration Date:   01/07/2021  Order Specific Question:   Reason for Exam (SYMPTOM  OR DIAGNOSIS REQUIRED)    Answer:   patient with multifocal bone pains r/o bone lesions    Order Specific Question:   Preferred imaging location?    Answer:   [facility]  . CBC with Differential/Platelet    Standing Status:   Future    Number of Occurrences:   1    Standing Expiration Date:   12/12/2020  . CMP ([place] only)    Standing Status:   Future    Number of Occurrences:   1    Standing Expiration Date:   11/07/2020  . Vitamin D 25 hydroxy    Standing Status:   Future     Number of Occurrences:   1    Standing Expiration Date:   11/07/2020  . PTH, intact and calcium    Standing Status:   Future    Number of Occurrences:   1    Standing Expiration Date:   11/07/2020  . JAK2 (including V617F and Exon 12), MPL, and CALR-Next Generation Sequencing    Standing Status:   Future    Number of Occurrences:   1    Standing Expiration Date:   11/07/2020  . Magnesium    Standing Status:   Future    Number of Occurrences:   1    Standing Expiration Date:   11/07/2020  . Multiple Myeloma Panel (SPEP&IFE w/QIG)    Standing Status:   Future    Number of Occurrences:   1    Standing Expiration Date:   11/07/2020    FOLLOW UP Labs today Bone scan in 1 week CT chest in 1 week Phone visit with Dr Irene Limbo in 2 weeks  All of the patients questions were answered with apparent satisfaction. The patient knows to call the clinic with any problems, questions or concerns.  I spent 40 minutes counseling the patient face to face. The total time spent in the appointment was 60 minutes and more than 50% was on counseling and direct patient cares.    Sullivan Lone MD [place] AAHIVMS [facility] Hematology/Oncology Physician  [facility]   (Office):       [phone] (Work cell):  [phone] (Fax):           [phone]  11/07/2019 8:33 PM  I, Scot Dock, am acting as a scribe for Dr. Sullivan Lone.   .I have reviewed the above documentation for accuracy and completeness, and I agree with the above. Brunetta Genera MD

## 2019-11-08 ENCOUNTER — Inpatient Hospital Stay: Payer: Medicare Other | Attending: Hematology | Admitting: Hematology

## 2019-11-08 ENCOUNTER — Other Ambulatory Visit: Payer: Self-pay

## 2019-11-08 ENCOUNTER — Inpatient Hospital Stay: Payer: Medicare Other

## 2019-11-08 VITALS — BP 158/92 | HR 76 | Temp 98.0°F | Resp 17 | Ht 72.0 in | Wt 205.3 lb

## 2019-11-08 DIAGNOSIS — I10 Essential (primary) hypertension: Secondary | ICD-10-CM

## 2019-11-08 DIAGNOSIS — K5900 Constipation, unspecified: Secondary | ICD-10-CM | POA: Insufficient documentation

## 2019-11-08 DIAGNOSIS — M419 Scoliosis, unspecified: Secondary | ICD-10-CM | POA: Diagnosis not present

## 2019-11-08 DIAGNOSIS — D751 Secondary polycythemia: Secondary | ICD-10-CM

## 2019-11-08 DIAGNOSIS — Z8249 Family history of ischemic heart disease and other diseases of the circulatory system: Secondary | ICD-10-CM | POA: Insufficient documentation

## 2019-11-08 DIAGNOSIS — E039 Hypothyroidism, unspecified: Secondary | ICD-10-CM | POA: Diagnosis not present

## 2019-11-08 DIAGNOSIS — Z8673 Personal history of transient ischemic attack (TIA), and cerebral infarction without residual deficits: Secondary | ICD-10-CM | POA: Diagnosis not present

## 2019-11-08 DIAGNOSIS — Z79899 Other long term (current) drug therapy: Secondary | ICD-10-CM | POA: Insufficient documentation

## 2019-11-08 DIAGNOSIS — M898X9 Other specified disorders of bone, unspecified site: Secondary | ICD-10-CM | POA: Diagnosis not present

## 2019-11-08 DIAGNOSIS — Z8349 Family history of other endocrine, nutritional and metabolic diseases: Secondary | ICD-10-CM | POA: Insufficient documentation

## 2019-11-08 DIAGNOSIS — Z808 Family history of malignant neoplasm of other organs or systems: Secondary | ICD-10-CM | POA: Diagnosis not present

## 2019-11-08 DIAGNOSIS — Z7982 Long term (current) use of aspirin: Secondary | ICD-10-CM | POA: Diagnosis not present

## 2019-11-08 DIAGNOSIS — M898X8 Other specified disorders of bone, other site: Secondary | ICD-10-CM | POA: Insufficient documentation

## 2019-11-08 DIAGNOSIS — R0789 Other chest pain: Secondary | ICD-10-CM | POA: Insufficient documentation

## 2019-11-08 DIAGNOSIS — Z8042 Family history of malignant neoplasm of prostate: Secondary | ICD-10-CM

## 2019-11-08 DIAGNOSIS — N400 Enlarged prostate without lower urinary tract symptoms: Secondary | ICD-10-CM | POA: Diagnosis not present

## 2019-11-08 DIAGNOSIS — R197 Diarrhea, unspecified: Secondary | ICD-10-CM | POA: Diagnosis not present

## 2019-11-08 DIAGNOSIS — F419 Anxiety disorder, unspecified: Secondary | ICD-10-CM | POA: Diagnosis not present

## 2019-11-08 DIAGNOSIS — Z800 Family history of malignant neoplasm of digestive organs: Secondary | ICD-10-CM

## 2019-11-08 DIAGNOSIS — D571 Sickle-cell disease without crisis: Secondary | ICD-10-CM | POA: Diagnosis present

## 2019-11-08 DIAGNOSIS — R0782 Intercostal pain: Secondary | ICD-10-CM

## 2019-11-08 LAB — CMP (CANCER CENTER ONLY)
ALT: 33 U/L (ref 0–44)
AST: 23 U/L (ref 15–41)
Albumin: 4.2 g/dL (ref 3.5–5.0)
Alkaline Phosphatase: 123 U/L (ref 38–126)
Anion gap: 8 (ref 5–15)
BUN: 13 mg/dL (ref 8–23)
CO2: 26 mmol/L (ref 22–32)
Calcium: 9.4 mg/dL (ref 8.9–10.3)
Chloride: 105 mmol/L (ref 98–111)
Creatinine: 1.34 mg/dL — ABNORMAL HIGH (ref 0.61–1.24)
GFR, Est AFR Am: >60 mL/min (ref >60)
GFR, Estimated: 54 mL/min — ABNORMAL LOW (ref >60)
Glucose, Bld: 107 mg/dL — ABNORMAL HIGH (ref 70–99)
Potassium: 3.9 mmol/L (ref 3.5–5.1)
Sodium: 139 mmol/L (ref 135–145)
Total Bilirubin: 1.6 mg/dL — ABNORMAL HIGH (ref 0.3–1.2)
Total Protein: 7.5 g/dL (ref 6.5–8.1)

## 2019-11-08 LAB — CBC WITH DIFFERENTIAL/PLATELET
Abs Immature Granulocytes: 0.01 10*3/uL (ref 0.00–0.07)
Basophils Absolute: 0 10*3/uL (ref 0.0–0.1)
Basophils Relative: 1 %
Eosinophils Absolute: 0.1 10*3/uL (ref 0.0–0.5)
Eosinophils Relative: 2 %
HCT: 53.9 % — ABNORMAL HIGH (ref 39.0–52.0)
Hemoglobin: 19.1 g/dL — ABNORMAL HIGH (ref 13.0–17.0)
Immature Granulocytes: 0 %
Lymphocytes Relative: 23 %
Lymphs Abs: 1.4 10*3/uL (ref 0.7–4.0)
MCH: 34.2 pg — ABNORMAL HIGH (ref 26.0–34.0)
MCHC: 35.4 g/dL (ref 30.0–36.0)
MCV: 96.4 fL (ref 80.0–100.0)
Monocytes Absolute: 0.7 10*3/uL (ref 0.1–1.0)
Monocytes Relative: 12 %
Neutro Abs: 3.9 10*3/uL (ref 1.7–7.7)
Neutrophils Relative %: 62 %
Platelets: 225 10*3/uL (ref 150–400)
RBC: 5.59 MIL/uL (ref 4.22–5.81)
RDW: 12.8 % (ref 11.5–15.5)
WBC: 6.1 10*3/uL (ref 4.0–10.5)
nRBC: 0 % (ref 0.0–0.2)

## 2019-11-08 LAB — VITAMIN D 25 HYDROXY (VIT D DEFICIENCY, FRACTURES): Vit D, 25-Hydroxy: 18.32 ng/mL — ABNORMAL LOW (ref 30–100)

## 2019-11-08 LAB — MAGNESIUM: Magnesium: 2.2 mg/dL (ref 1.7–2.4)

## 2019-11-08 NOTE — Patient Instructions (Signed)
Thank you for choosing [facility] to provide your oncology and hematology care.   Should you have questions after your visit to the [facility] ([facility]), please contact this office at [phone] between 8:30 AM and 4:30 PM.  Voice mails left after 4:00 PM may not be returned until the following business day.  Calls received after 4:30 PM will be answered by an off-site Nurse Triage Line.    Prescription Refills:  Please have your pharmacy contact us directly for most prescription requests.  Contact the office directly for refills of narcotics (pain medications). Allow 48-72 hours for refills.  Appointments: Please contact the [facility] scheduling department [phone] for questions regarding [facility] appointment scheduling.  Contact the schedulers with any scheduling changes so that your appointment can be rescheduled in a timely manner.   Central Scheduling for [place] [phone] - Call to schedule procedures such as PET scans, CT scans, MRI, Ultrasound, etc.  To afford each patient quality time with our providers, please arrive 30 minutes before your scheduled appointment time.  If you arrive late for your appointment, you may be asked to reschedule.  We strive to give you quality time with our providers, and arriving late affects you and other patients whose appointments are after yours. If you are a no show for multiple scheduled visits, you may be dismissed from the clinic at the providers discretion.     Resources: [facility] Social Workers [phone] for additional information on assistance programs or assistance connecting with community support programs   [place] DSS  336-641-3447: Information regarding food stamps, Medicaid, and utility assistance [place]AT 336-333-6589   [place] [employer]'s shared-ride transportation service for eligible riders who have a disability that prevents them from riding the fixed route bus.   Medicare Rights Center  800-333-4114 Helps people with Medicare understand their rights and benefits, navigate the Medicare system, and secure the quality healthcare they deserve [employer] 800-227-2345 Assists patients locate various types of support and financial assistance Cancer Care: 1-800-813-HOPE (4673) Provides financial assistance, online support groups, medication/co-pay assistance.   Transportation Assistance for appointments at [facility]: Transportation Coordinator [phone]  Again, thank you for choosing  Cancer Center for your care.       

## 2019-11-09 LAB — PTH, INTACT AND CALCIUM
Calcium, Total (PTH): 9.6 mg/dL (ref 8.6–10.2)
PTH: 36 pg/mL (ref 15–65)

## 2019-11-12 ENCOUNTER — Telehealth: Payer: Self-pay | Admitting: Hematology

## 2019-11-12 LAB — MULTIPLE MYELOMA PANEL, SERUM
Albumin SerPl Elph-Mcnc: 4.3 g/dL (ref 2.9–4.4)
Albumin/Glob SerPl: 1.4 (ref 0.7–1.7)
Alpha 1: 0.2 g/dL (ref 0.0–0.4)
Alpha2 Glob SerPl Elph-Mcnc: 0.8 g/dL (ref 0.4–1.0)
B-Globulin SerPl Elph-Mcnc: 1.2 g/dL (ref 0.7–1.3)
Gamma Glob SerPl Elph-Mcnc: 0.9 g/dL (ref 0.4–1.8)
Globulin, Total: 3.1 g/dL (ref 2.2–3.9)
IgA: 193 mg/dL (ref 61–437)
IgG (Immunoglobin G), Serum: 765 mg/dL (ref 603–1613)
IgM (Immunoglobulin M), Srm: 35 mg/dL (ref 20–172)
Total Protein ELP: 7.4 g/dL (ref 6.0–8.5)

## 2019-11-12 NOTE — Telephone Encounter (Signed)
Scheduled per 02/11 los, patient has been called and notified. 

## 2019-11-14 ENCOUNTER — Other Ambulatory Visit: Payer: Self-pay | Admitting: Family Medicine

## 2019-11-14 DIAGNOSIS — K219 Gastro-esophageal reflux disease without esophagitis: Secondary | ICD-10-CM

## 2019-11-14 NOTE — Telephone Encounter (Signed)
OV 03/28/19 rtc 1 yr

## 2019-11-15 ENCOUNTER — Other Ambulatory Visit: Payer: Self-pay

## 2019-11-15 ENCOUNTER — Ambulatory Visit (HOSPITAL_COMMUNITY)
Admission: RE | Admit: 2019-11-15 | Discharge: 2019-11-15 | Disposition: A | Discharge status: Home / Self Care | Payer: Medicare Other | Source: Ambulatory Visit | Attending: Hematology | Admitting: Hematology

## 2019-11-15 ENCOUNTER — Encounter (HOSPITAL_COMMUNITY): Payer: Self-pay

## 2019-11-15 DIAGNOSIS — R0782 Intercostal pain: Secondary | ICD-10-CM

## 2019-11-15 DIAGNOSIS — M898X9 Other specified disorders of bone, unspecified site: Secondary | ICD-10-CM | POA: Insufficient documentation

## 2019-11-15 DIAGNOSIS — M2550 Pain in unspecified joint: Secondary | ICD-10-CM | POA: Diagnosis not present

## 2019-11-15 DIAGNOSIS — R0789 Other chest pain: Secondary | ICD-10-CM | POA: Diagnosis not present

## 2019-11-18 ENCOUNTER — Encounter: Payer: Self-pay | Admitting: Hematology

## 2019-11-20 ENCOUNTER — Other Ambulatory Visit: Payer: Self-pay | Admitting: Family Medicine

## 2019-11-20 DIAGNOSIS — E034 Atrophy of thyroid (acquired): Secondary | ICD-10-CM

## 2019-11-22 ENCOUNTER — Inpatient Hospital Stay (HOSPITAL_BASED_OUTPATIENT_CLINIC_OR_DEPARTMENT_OTHER): Payer: Medicare Other | Admitting: Hematology

## 2019-11-22 DIAGNOSIS — R0789 Other chest pain: Secondary | ICD-10-CM

## 2019-11-22 DIAGNOSIS — M898X9 Other specified disorders of bone, unspecified site: Secondary | ICD-10-CM

## 2019-11-22 DIAGNOSIS — D751 Secondary polycythemia: Secondary | ICD-10-CM | POA: Diagnosis not present

## 2019-11-22 LAB — JAK2 (INCLUDING V617F AND EXON 12), MPL,& CALR-NEXT GEN SEQ

## 2019-11-22 MED ORDER — ERGOCALCIFEROL 1.25 MG (50000 UT) PO CAPS: 50000.0000 [IU] | ORAL_CAPSULE | ORAL | 3 refills | Status: DC

## 2019-11-22 NOTE — Progress Notes (Signed)
HEMATOLOGY/ONCOLOGY CLINIC NOTE  Date of Service: 11/22/2019  Patient Care Team: Janora Norlander, DO as PCP - General (Family Medicine) Luna Kitchens., MD (Neurosurgery) Sable Feil, MD (Gastroenterology) Kathrynn Ducking, MD (Neurology) Edrick Oh, MD (Nephrology) Minus Breeding, MD as Consulting Physician (Cardiology) Janora Norlander, DO as Consulting Physician (Family Medicine)  REFERRING PHYSICIAN: Janora Norlander, DO  CHIEF COMPLAINTS/PURPOSE OF CONSULTATION:  Bone Pain   HISTORY OF PRESENTING ILLNESS:   Jacob Kerr is a wonderful 69 y.o. male who has been referred to [place] by Janora Norlander, DO for evaluation and management of Bone Pain. The pt reports that he is doing well overall.   The pt reports that he has right should blade pain and right rib pain. He also has random bone pain in his hips.  June 2015 he fell going down hill and it effected his right side.  He has had several imaging studies since his fall that has not revealed a cause to his pain.  He has noticed that he has some trouble controlling his bladder. He also experiences bowel issues such diarrhea and constipation. He has tried pelvic floor therapy.   He has seen a neurologist and had a nerve ablation with no improvement.  He has had some cutaneous squamous cell carcinoma removed.  He has scoliosis in his spine.  He has been to a pain clinic but does not want to take the pain medications due to the side effects.    He states that he is drinking a fair amount of ETOH to sleep. He did not start drinking since 2015.  He experiences some pain his big toe that feels as if someone is drilling a nail through his toe.  He has tried never medications that did not help.  He has noticed that there as been a 5-10 lb radiance in his weight.  He has tried yoga and lays on a bed of nails every night.  He has also tried dry needling treatments.  He is using a TENS that  helps temporarily.  He has never had a bone density test and he is not taking Vitamin D.  No localized back pain.  He feels something growing under his right shoulder blade.   Most recent lab results (03/28/2019) of CBC is as follows: all values are WNL except for MCV at 100, MCH at 34.3, Glucose at 113, BUN/Creatinine Ratio at 9.  On review of systems, pt reports pain over the right should blade and pain in the right ribs, uncontrollable bowels, and denies abdominal pain and any other symptoms.   On Social Hx the pt reports never smoker. He began drinking 2015. Currently drinking a half of a gallon of crown royal whisky.  On Family Hx the pt reports that his sister had a cancerous tumor behind her left eye. She passed from this cancer at 30. He has another sister who passed away from carcinoma.   INTERVAL HISTORY:   I connected with  Jacob Kerr on 11/22/19 by a telephone and verified that I am speaking with the correct person using two identifiers.   I discussed the limitations of evaluation and management by telemedicine. The patient expressed understanding and agreed to proceed.  Other persons participating in the visit and their role in the encounter:        -Dawayne Cirri, [place], Medical Scribe  Patient's location: Home Provider's location:  [facility], [employer] Normoyle is a wonderful 69 y.o. male who is here for evaluation and management of Bone Pain. The patient's last visit with [place] was on 11/08/2019. The pt reports that he is doing well overall.  The pt reports he had no new concerns since last meeting. Pt had a sleep study done 3 years ago with no findings. Pt reports that back pain has become significantly worse, uses heat and ice that has no change in symptoms and says that he wears muscle stimulator for the majority of the day.   Of note since the patient's last visit, pt has had Bone Survey ([id number]) completed on 11/15/2019  with results revealing "1. Punctate lucencies are noted within the skull. Although these could represent small vascular lakes, metastatic disease or myeloma cannot be excluded. No other clearcut lytic lesions are identified. No sclerotic lesions are identified. 2. Diffuse degenerative change throughout the spine with thoracic spine scoliosis. No acute bony abnormality identified. 3.  Carotid vascular disease cannot be excluded."   Pt has had CT Chest ([id number]) completed on 11/15/2019 with results revealing "1. No acute findings or other active disease within the thorax. 2. Hepatic steatosis."  Lab results (11/08/19) of CBC w/diff and CMP is as follows: all values are WNL except for Hemoglobin at 19.1, HCT at 53.9, MCH at 34.2, Glucose at 107, Creatinine at 1.34, Total Bilirubin at 1.6, GFR, Est Non Af Am at 54. 11/08/2019 of Vitamin D 25 hydroxy at 18.32.  11/08/2019 MMP is as follows: all values are WNL 11/08/2019 PTH, intact and Calcium is as follows: all values WNL, PTH at 36, Calcium at 9.6 11/08/2019 Magnesium is as follows: all values are WNL, Magnesium at 2.2 11/08/2019 JAK2, MPL AND CALR revealed "No mutations identified"   On review of systems, pt reports back pain and denies any other symptoms.   MEDICAL HISTORY:  Past Medical History:  Diagnosis Date  . Acute meniscal tear of left knee   . Allergic rhinitis   . Anal fissure   . Anxiety   . BPH (benign prostatic hypertrophy)   . Cervical spondylosis without myelopathy   . Cervicogenic headache   . Chronic headaches   . Chronic neck pain    uses TENS unit  prn  . Diverticulitis   . Gallstones   . GERD (gastroesophageal reflux disease)   . H/O hiatal hernia   . History of diverticulitis   . History of kidney stones   . History of TIA (transient ischemic attack)    04/ 2011---  no residuals  . Hypertension   . Hypothyroidism   . Peripheral neuropathy   . Shingles   . Syrinx of spinal cord ([place])    C4 -- C7      SURGICAL HISTORY: Past Surgical History:  Procedure Laterality Date  . CARDIAC CATHETERIZATION  02-18-2010   dr Angelena Form   normal coronary arteries/  normal lvsf--  ef 65%  . COLONOSCOPY    . [place]  . HERNIA REPAIR     [facility] [place] [facility]  . KNEE ARTHROSCOPY Left 03/13/2014   Procedure: LEFT ARTHROSCOPY KNEE WITH DEBRIDMENT;  Surgeon: Gearlean Alf, MD;  Location: [facility];  Service: Orthopedics;  Laterality: Left;  . LAPAROSCOPIC CHOLECYSTECTOMY  01-30-2003  . NEGATIVE SLEEP STUDY  2013   per pt  . RADIOFREQUENCY ABLATION NERVES  05/2016  . REMOVAL FORGEIN BODY FROM EAR  AGE 73  . RIGHT URETEROSCOPIC LASER  LITHOTRIPSY STONE EXTRACTION /  STENT PLACEMENT  05-10-2000  . TRANSTHORACIC ECHOCARDIOGRAM  01-16-2010   normal lvf/  ef  60%/  mild lae     SOCIAL HISTORY: Social History   Socioeconomic History  . Marital status: Married    Spouse name: Not on file  . Number of children: 2  . Years of education: 45  . Highest education level: Not on file  Occupational History  . Occupation: Retired    Fish farm manager: Chief of Staff  Tobacco Use  . Smoking status: Never Smoker  . Smokeless tobacco: Never Used  Substance and Sexual Activity  . Alcohol use: Yes    Alcohol/week: 7.0 standard drinks    Types: 7 Shots of liquor per week    Comment: 2 at night  . Drug use: No  . Sexual activity: Not on file  Other Topics Concern  . Not on file  Social History Narrative   Lives at home with wife.       Patient is right handed.   Patient drinks 1-2 cups of caffeine daily.   Social Determinants of Health   Financial Resource Strain: Low Risk   . Difficulty of Paying Living Expenses: Not hard at all  Food Insecurity: No Food Insecurity  . Worried About Charity fundraiser in the Last Year: Never true  . Ran Out of Food in the Last Year: Never true  Transportation Needs: No Transportation Needs  . Lack of Transportation (Medical): No  .  Lack of Transportation (Non-Medical): No  Physical Activity: Sufficiently Active  . Days of Exercise per Week: 6 days  . Minutes of Exercise per Session: 30 min  Stress: No Stress Concern Present  . Feeling of Stress : Only a little  Social Connections: Somewhat Isolated  . Frequency of Communication with Friends and Family: More than three times a week  . Frequency of Social Gatherings with Friends and Family: More than three times a week  . Attends Religious Services: Never  . Active Member of Clubs or Organizations: No  . Attends Archivist Meetings: Never  . Marital Status: Married  Human resources officer Violence: Not At Risk  . Fear of Current or Ex-Partner: No  . Emotionally Abused: No  . Physically Abused: No  . Sexually Abused: No     FAMILY HISTORY: Family History  Problem Relation Age of Onset  . Prostate cancer Father   . Heart disease Father   . Stroke Father   . Nephrolithiasis Sister   . Cancer Sister        Intestinal  . Hyperlipidemia Sister   . Hyperlipidemia Sister   . Cancer Sister        Sinus  . COPD Other   . Colon polyps Brother        x2  . Colon polyps Sister   . Colon cancer Neg Hx   . Esophageal cancer Neg Hx      ALLERGIES:   is allergic to cymbalta [duloxetine hcl]; lyrica [pregabalin]; neurontin [gabapentin]; robaxin [methocarbamol]; sudafed [pseudoephedrine hcl]; and epinephrine.   MEDICATIONS:  Current Outpatient Medications  Medication Sig Dispense Refill  . amLODipine (NORVASC) 10 MG tablet TAKE 1 TABLET BY MOUTH  DAILY 90 tablet 1  . aspirin 81 MG tablet Take 81 mg by mouth at bedtime.     Marland Kitchen azelastine (ASTELIN) 0.1 % nasal spray PLACE 2 SPRAYS INTO BOTH NOSTRILS 2 (TWO) TIMES DAILY. (Patient taking differently: Place 2 sprays into both nostrils as needed. )  30 mL 5  . baclofen (LIORESAL) 10 MG tablet Take 1 tablet (10 mg total) by mouth 2 (two) times daily. 180 each 1  . Coenzyme Q10 (CO Q 10) 100 MG CAPS Take 1 capsule by  mouth daily.    . ergocalciferol (VITAMIN D2) 1.25 MG (50000 UT) capsule Take 1 capsule (50,000 Units total) by mouth 2 (two) times a week. 24 capsule 3  . levothyroxine (SYNTHROID) 150 MCG tablet TAKE 1 TABLET BY MOUTH  DAILY BEFORE BREAKFAST  NEEDS TO BE SEEN FOR  FURTHER REFILLS 90 tablet 1  . LORazepam (ATIVAN) 0.5 MG tablet Take 1 tablet (0.5 mg total) by mouth 2 (two) times daily as needed for anxiety. (Patient taking differently: Take 0.5 mg by mouth as needed for anxiety. ) 30 tablet 0  . losartan (COZAAR) 50 MG tablet TAKE 1 TABLET BY MOUTH  DAILY 90 tablet 1  . NON FORMULARY Take 3 capsules by mouth every morning. Nugenix Testosterone Booster / [place]     . omeprazole (PRILOSEC) 20 MG capsule TAKE 1 CAPSULE BY MOUTH TWO TIMES DAILY 180 capsule 1  . UNABLE TO FIND Med Name: CBD oil    . venlafaxine XR (EFFEXOR-XR) 75 MG 24 hr capsule TAKE 1 CAPSULE BY MOUTH  DAILY WITH BREAKFAST 90 capsule 1   No current facility-administered medications for this visit.     REVIEW OF SYSTEMS:   A 10+ POINT REVIEW OF SYSTEMS WAS OBTAINED including neurology, dermatology, psychiatry, cardiac, respiratory, lymph, extremities, GI, GU, Musculoskeletal, constitutional, breasts, reproductive, HEENT.  All pertinent positives are noted in the HPI.  All others are negative.   PHYSICAL EXAMINATION: ECOG PERFORMANCE STATUS: 2 - Symptomatic, <50% confined to bed  There were no vitals filed for this visit. There were no vitals filed for this visit. There is no height or weight on file to calculate BMI.  Telehealth visit   LABORATORY DATA:  I have reviewed the data as listed  CBC Latest Ref Rng & Units 11/08/2019 03/28/2019 06/09/2018  WBC 4.0 - 10.5 K/uL 6.1 6.1 -  Hemoglobin 13.0 - 17.0 g/dL 19.1(H) 17.2 17.7(H)  Hematocrit 39.0 - 52.0 % 53.9(H) 50.2 52.0  Platelets 150 - 400 K/uL 225 227 -   11/16/2019 JAK2:   RADIOGRAPHIC STUDIES: I have personally reviewed the radiological images as listed and agreed  with the findings in the report. CT Chest Wo Contrast  Result Date: 11/15/2019 CLINICAL DATA:  Severe right-sided chest wall pain. EXAM: CT CHEST WITHOUT CONTRAST TECHNIQUE: Multidetector CT imaging of the chest was performed following the standard protocol without IV contrast. COMPARISON:  None. FINDINGS: Cardiovascular: No acute findings. Aortic and coronary artery atherosclerosis incidentally noted. Mediastinum/Nodes: No masses or pathologically enlarged lymph nodes identified on this unenhanced exam. Lungs/Pleura: No pulmonary infiltrate or mass identified. No effusion present. Upper Abdomen:  Mild diffuse hepatic steatosis again noted. Musculoskeletal: No suspicious bone lesions. No fractures identified. No evidence of chest wall mass. IMPRESSION: 1. No acute findings or other active disease within the thorax. 2. Hepatic steatosis. Aortic Atherosclerosis (ICD10-I70.0). Coronary artery atherosclerosis. Electronically Signed   By: Marlaine Hind M.D.   On: 11/15/2019 12:53   DG Bone Survey Met  Result Date: 11/15/2019 CLINICAL DATA:  Multifocal bone pain. EXAM: METASTATIC BONE SURVEY COMPARISON:  No recent prior bone survey is available. MRI of the head 06/09/2018. CT of the head 06/09/2018. FINDINGS: Imaging of the axial and appendicular skeleton performed. Chest x-ray reveals no acute cardiopulmonary disease. Punctate lucencies noted within  the skull. Although these could represent small vascular lakes, metastatic disease or myeloma cannot be excluded. No other clearcut lytic lesions are noted. No sclerotic lesions. Diffuse degenerative changes are noted throughout the cervical, thoracic, lumbar spine. Thoracic scoliosis is present. No acute bony abnormality identified. No evidence of fracture. Carotid vascular calcification cannot be excluded. Surgical clips in the pelvis. IMPRESSION: 1. Punctate lucencies are noted within the skull. Although these could represent small vascular lakes, metastatic disease  or myeloma cannot be excluded. No other clearcut lytic lesions are identified. No sclerotic lesions are identified. 2. Diffuse degenerative change throughout the spine with thoracic spine scoliosis. No acute bony abnormality identified. 3.  Carotid vascular disease cannot be excluded. Electronically Signed   By: Marcello Moores  Register   On: 11/15/2019 12:21     ASSESSMENT & PLAN:   DASON MOSLEY is a 69 y.o. male with:  1. Persistent generalized bone pains and back pains - ? Related to scoliosis. 2. Chest wall pain ? From trauma vs other etiology 3. 3. Polycythemia -  R/o PCV  PLAN: -Discussed 11/08/2019 MMP is as follows: all values are WNL -Discussed 11/08/19; Pt has had CT Chest ([id number]) completed on 11/15/2019 with results revealing "1. No acute findings or other active disease within the thorax. 2. Hepatic steatosis." -Discussed 11/15/2019 Bone Survey ([id number]) which revealed no significant bone lesions -Discussed 11/08/2019 of Vitamin D 25 hydroxy is low at 18.32. -Discussed 11/08/2019 PTH, intact and Calcium is as follows: all values WNL, PTH at 36, Calcium at 9.6 -Discussed 11/08/19 of CBC w/diff and CMP is as follows: all values are WNL except for Hemoglobin at 19.1, HCT at 53.9, MCH at 34.2, Glucose at 107, Creatinine at 1.34, Total Bilirubin at 1.6, GFR, Est Non Af Am at 54. -Discussed 11/08/2019 JAK2, MPL AND CALR revealed "No mutations identified" -Advised pt that CT Chest shows no malignancy. Back pain likely due to scoliosis and degenerative changes- recommend seeing pain specialist for scoliosis -Pt does not have PCV based on mutation testing. Polycythemia most likely due to dehydration and alcohol use   -Lack of M Protein on MMP and no bone lesions identified on Bone Scan make Mulitple Myeloma unlikely  -Recommends hydrating, cutting down alcohol use  -F/u with PCP about pain management and sleep study  -Rx 2x weekly Vitamin D  -Will see back in 4 months with labs     . No orders of the defined types were placed in this encounter.   FOLLOW UP RTC with Dr Irene Limbo with labs in 4 months to f/u on polycythemia   The total time spent in the appt was 20 minutes and more than 50% was on counseling and direct patient cares.  All of the patient's questions were answered with apparent satisfaction. The patient knows to call the clinic with any problems, questions or concerns.    Sullivan Lone MD [place] AAHIVMS [facility] Hematology/Oncology Physician  [facility]   (Office):       [phone] (Work cell):  [phone] (Fax):           [phone]  11/22/2019 2:34 PM  I, Yevette Edwards, am acting as a scribe for Dr. Sullivan Lone.   .I have reviewed the above documentation for accuracy and completeness, and I agree with the above. Brunetta Genera MD

## 2019-11-26 ENCOUNTER — Other Ambulatory Visit: Payer: Self-pay | Admitting: Family Medicine

## 2019-11-26 ENCOUNTER — Encounter: Payer: Self-pay | Admitting: Family Medicine

## 2019-11-26 MED ORDER — BACLOFEN 10 MG PO TABS: 10.0000 mg | ORAL_TABLET | Freq: Two times a day (BID) | ORAL | 1 refills | Status: DC

## 2019-11-28 ENCOUNTER — Other Ambulatory Visit: Payer: Self-pay | Admitting: Hematology

## 2019-11-28 DIAGNOSIS — D751 Secondary polycythemia: Secondary | ICD-10-CM

## 2019-11-28 NOTE — Progress Notes (Signed)
cbc

## 2019-11-29 ENCOUNTER — Telehealth: Payer: Self-pay | Admitting: Hematology

## 2019-11-29 NOTE — Telephone Encounter (Signed)
Scheduled appt per 3/3 sch message - - pt aware of appt date and time 

## 2019-11-30 ENCOUNTER — Encounter: Payer: Self-pay | Admitting: Family Medicine

## 2019-12-03 ENCOUNTER — Other Ambulatory Visit: Payer: Self-pay | Admitting: Family Medicine

## 2019-12-05 ENCOUNTER — Encounter: Payer: Self-pay | Admitting: Family Medicine

## 2019-12-17 ENCOUNTER — Other Ambulatory Visit: Payer: Self-pay | Admitting: Family Medicine

## 2019-12-17 ENCOUNTER — Encounter: Payer: Self-pay | Admitting: Family Medicine

## 2019-12-17 DIAGNOSIS — G8929 Other chronic pain: Secondary | ICD-10-CM

## 2019-12-17 MED ORDER — BACLOFEN 10 MG PO TABS: 10.0000 mg | ORAL_TABLET | Freq: Three times a day (TID) | ORAL | 1 refills | Status: DC

## 2019-12-20 ENCOUNTER — Encounter: Payer: Self-pay | Admitting: Physical Medicine and Rehabilitation

## 2020-01-10 ENCOUNTER — Encounter
Payer: Medicare Other | Attending: Physical Medicine and Rehabilitation | Admitting: Physical Medicine and Rehabilitation

## 2020-01-10 ENCOUNTER — Other Ambulatory Visit: Payer: Self-pay

## 2020-01-10 ENCOUNTER — Encounter: Payer: Self-pay | Admitting: Physical Medicine and Rehabilitation

## 2020-01-10 VITALS — BP 158/83 | HR 74 | Temp 97.2°F | Ht 72.0 in | Wt 198.0 lb

## 2020-01-10 DIAGNOSIS — G588 Other specified mononeuropathies: Secondary | ICD-10-CM | POA: Insufficient documentation

## 2020-01-10 DIAGNOSIS — G950 Syringomyelia and syringobulbia: Secondary | ICD-10-CM | POA: Diagnosis not present

## 2020-01-10 DIAGNOSIS — M48062 Spinal stenosis, lumbar region with neurogenic claudication: Secondary | ICD-10-CM | POA: Diagnosis not present

## 2020-01-10 MED ORDER — AMITRIPTYLINE HCL 10 MG PO TABS: 10.0000 mg | ORAL_TABLET | Freq: Every day | ORAL | 3 refills | Status: DC

## 2020-01-10 NOTE — Progress Notes (Signed)
Subjective:    Patient ID: Jacob Kerr, male    DOB: Nov 25, 1950, 69 y.o.   MRN: [id number]  HPI  Jacob Kerr is a 69 year old man who has a syrinxes in his cervical and thoracic spines, who is presenting with right sided pain extending from his cervical spine to thoracic spine. He has a 9/10 pain on average, that is affecting his good nature. He started drinking so he could sleep. Then he realized how much he was spending on alcohol so he stopped drinking 1 month ago and has not had any cravings.   For pain, he has tried Lyrica which made him loopy, Gabapentin which gave him strange nightmares and changed his personality. He has tried anti-inflammatories and took quite a lot without benefit. It increased his BP and caused his vision to narrow, so he no longer takes Tylenol or NSAIDs, which hurts his stomach. He has tried a couple more in the Gabapentin family. Two of his sisters and his nephew were addicted to pain medications.   He has seen a neurosurgeon and was told there is no intervention necessary.   He does daily stretching. He does no lifting of weights as he as noticed tingling in his right 4th and 5th digits and has an appointment for a ligament repair.  He had a CT Chest in 2/18 for right sided chest wall pain that showed no evidence of disease.   Bone scan 2/18 IMPRESSION: 1. Punctate lucencies are noted within the skull. Although these could represent small vascular lakes, metastatic disease or myeloma cannot be excluded. No other clearcut lytic lesions are identified. No sclerotic lesions are identified. 2. Diffuse degenerative change throughout the spine with thoracic spine scoliosis. No acute bony abnormality identified. 3.  Carotid vascular disease cannot be excluded  He has seen an oncologist and was told that hs vitamin D was decreased.   MRI Cervical Spine: IMPRESSION: 1. Unchanged small syrinx in the lower cervical spinal cord. 2. Unchanged cervical disc  degeneration without spinal stenosis. 3. Moderate right neural foraminal stenosis at C4-5.  MRI Thoracic Spine: No significant change. No abnormality seen to explain fecal incontinence. Thoracic spinal curvature with mild degenerative disc disease. No disc herniation or compressive central canal stenosis. Some facet degeneration on the right in the upper thoracic region in the left in the lower thoracic region could certainly contribute to back pain. There does not appear to be any gross compressive foraminal stenosis.  Small thoracic cord syrinx is stable and not likely the cause of the clinical presentation.  He has done physical therapy and pelvic floor therapy without much benefit.  Pain Inventory Average Pain 9 Pain Right Now 7 My pain is constant, sharp, burning, stabbing and tingling  In the last 24 hours, has pain interfered with the following? General activity 9 Relation with others 8 Enjoyment of life 10 What TIME of day is your pain at its worst? daytime Sleep (in general) Poor  Pain is worse with: bending, inactivity and standing Pain improves with: heat/ice, therapy/exercise, pacing activities and TENS Relief from Meds: 4  Mobility walk without assistance how many minutes can you walk? 30 ability to climb steps?  yes do you drive?  yes  Function disabled: date disabled 08/2014 I need assistance with the following:  household duties  Neuro/Psych bladder control problems bowel control problems weakness numbness tingling spasms dizziness anxiety  Prior Studies Any changes since last visit?  no  Physicians involved in your care Any changes since  last visit?  no Primary care Jacob Kerr   Family History  Problem Relation Age of Onset  . Prostate cancer Father   . Heart disease Father   . Stroke Father   . Nephrolithiasis Sister   . Cancer Sister        Intestinal  . Hyperlipidemia Sister   . Hyperlipidemia Sister   . Cancer Sister         Sinus  . COPD Other   . Colon polyps Brother        x2  . Colon polyps Sister   . Colon cancer Neg Hx   . Esophageal cancer Neg Hx    Social History   Socioeconomic History  . Marital status: Married    Spouse name: Not on file  . Number of children: 2  . Years of education: 76  . Highest education level: Not on file  Occupational History  . Occupation: Retired    Fish farm manager: Chief of Staff  Tobacco Use  . Smoking status: Never Smoker  . Smokeless tobacco: Never Used  Substance and Sexual Activity  . Alcohol use: Yes    Alcohol/week: 7.0 standard drinks    Types: 7 Shots of liquor per week    Comment: 2 at night  . Drug use: No  . Sexual activity: Not on file  Other Topics Concern  . Not on file  Social History Narrative   Lives at home with wife.       Patient is right handed.   Patient drinks 1-2 cups of caffeine daily.   Social Determinants of Health   Financial Resource Strain: Low Risk   . Difficulty of Paying Living Expenses: Not hard at all  Food Insecurity: No Food Insecurity  . Worried About Charity fundraiser in the Last Year: Never true  . Ran Out of Food in the Last Year: Never true  Transportation Needs: No Transportation Needs  . Lack of Transportation (Medical): No  . Lack of Transportation (Non-Medical): No  Physical Activity: Sufficiently Active  . Days of Exercise per Week: 6 days  . Minutes of Exercise per Session: 30 min  Stress: No Stress Concern Present  . Feeling of Stress : Only a little  Social Connections: Somewhat Isolated  . Frequency of Communication with Friends and Family: More than three times a week  . Frequency of Social Gatherings with Friends and Family: More than three times a week  . Attends Religious Services: Never  . Active Member of Clubs or Organizations: No  . Attends Archivist Meetings: Never  . Marital Status: Married   Past Surgical History:  Procedure Laterality Date  . CARDIAC CATHETERIZATION   02-18-2010   dr Angelena Form   normal coronary arteries/  normal lvsf--  ef 65%  . COLONOSCOPY    . [place]  . HERNIA REPAIR     [facility] [place] [facility]  . KNEE ARTHROSCOPY Left 03/13/2014   Procedure: LEFT ARTHROSCOPY KNEE WITH DEBRIDMENT;  Surgeon: Gearlean Alf, MD;  Location: [facility];  Service: Orthopedics;  Laterality: Left;  . LAPAROSCOPIC CHOLECYSTECTOMY  01-30-2003  . NEGATIVE SLEEP STUDY  2013   per pt  . RADIOFREQUENCY ABLATION NERVES  05/2016  . REMOVAL FORGEIN BODY FROM EAR  AGE 24  . RIGHT URETEROSCOPIC LASER LITHOTRIPSY STONE EXTRACTION /  STENT PLACEMENT  05-10-2000  . TRANSTHORACIC ECHOCARDIOGRAM  01-16-2010   normal lvf/  ef  60%/  mild lae   Past Medical  History:  Diagnosis Date  . Acute meniscal tear of left knee   . Allergic rhinitis   . Anal fissure   . Anxiety   . BPH (benign prostatic hypertrophy)   . Cervical spondylosis without myelopathy   . Cervicogenic headache   . Chronic headaches   . Chronic neck pain    uses TENS unit  prn  . Diverticulitis   . Gallstones   . GERD (gastroesophageal reflux disease)   . H/O hiatal hernia   . History of diverticulitis   . History of kidney stones   . History of TIA (transient ischemic attack)    04/ 2011---  no residuals  . Hypertension   . Hypothyroidism   . Peripheral neuropathy   . Shingles   . Syrinx of spinal cord (HCC)    C4 -- C7   BP (!) 158/83   Pulse 74   Temp (!) 97.2 F (36.2 C)   Ht 6' (1.829 m)   Wt 198 lb (89.8 kg)   SpO2 97%   BMI 26.85 kg/m   Opioid Risk Score:   Fall Risk Score:  `1  Depression screen PHQ 2/9  Depression screen [facility] 2/9 01/10/2020 06/14/2019 03/28/2019 02/06/2019 12/05/2018 11/06/2018 10/05/2018  Decreased Interest 0 0 0 0 1 2 2   Down, Depressed, Hopeless 0 0 0 0 0 1 2  PHQ - 2 Score 0 0 0 0 1 3 4   Altered sleeping 3 - - - - 1 2  Tired, decreased energy 3 - - - - 2 2  Change in appetite 1 - - - - 1 2  Feeling bad or failure about  yourself  0 - - - - 1 1  Trouble concentrating 2 - - - - 1 1  Moving slowly or fidgety/restless 1 - - - - 1 1  Suicidal thoughts 0 - - - - 0 0  PHQ-9 Score 10 - - - - 10 13  Difficult doing work/chores Somewhat difficult - - - - Somewhat difficult Somewhat difficult  Some recent data might be hidden    Review of Systems  Constitutional: Positive for unexpected weight change.  HENT: Negative.   Eyes: Negative.   Respiratory: Negative.   Cardiovascular: Negative.   Gastrointestinal: Positive for constipation and diarrhea.  Endocrine: Negative.   Genitourinary:       Bladder control  Musculoskeletal:       Spasms  Skin: Negative.   Allergic/Immunologic: Negative.   Neurological: Positive for dizziness, weakness and numbness.       Tingling  Hematological: Negative.   Psychiatric/Behavioral: The patient is nervous/anxious.   All other systems reviewed and are negative.     Objective:   Physical Exam Gen: no distress, normal appearing HEENT: oral mucosa pink and moist, NCAT Cardio: Reg rate Chest: normal effort, normal rate of breathing Abd: soft, non-distended Ext: no edema Skin: intact Neuro: AOx3.  Musculoskeletal: 5/5 strength throughout.  Sensation intact with the exception of dorsal and palmar aspects of right 4th and 5th digits. Very good muscle bulk; no evidence of atrophy.  Psych: pleasant, normal affect    Assessment & Plan:  Jacob Kerr is a 69 year old man who presents with right sided neck, thoracic spine, ribs, and groin pain.   --His pain is possibly associated with his cervical and thoracic syrinxes, which have been deemed too small to operate on and are causing no other significant symptoms, with the exception of fecal incontinence approx once per month, which may  be related.   --He is also sleeping very poorly at night, which is likely increasing his inflammation and contributing to his pain.  --He has a burning sensation in his right chest wall which  may be secondary to intercostal neuralgia. MRIs of his cervical and thoracic spine personally reviewed and show evidence of foraminal stenosis.   --At this time recommend Amitriptyline 10mg  HS to improve sleep and to treat neuropathic pain. Advised regarding side effects. Can uptitrate dose if well tolerated.   --Also provided referral to physical therapy for myofascial release, stretching and strengthening of muscles of upper back and neck, and development of a HEP.  --All questions answered. RTC in 1 month.

## 2020-01-15 DIAGNOSIS — G5601 Carpal tunnel syndrome, right upper limb: Secondary | ICD-10-CM | POA: Diagnosis not present

## 2020-01-15 DIAGNOSIS — G5600 Carpal tunnel syndrome, unspecified upper limb: Secondary | ICD-10-CM | POA: Insufficient documentation

## 2020-01-15 DIAGNOSIS — M25521 Pain in right elbow: Secondary | ICD-10-CM | POA: Diagnosis not present

## 2020-01-15 DIAGNOSIS — G5621 Lesion of ulnar nerve, right upper limb: Secondary | ICD-10-CM | POA: Diagnosis not present

## 2020-01-17 DIAGNOSIS — L570 Actinic keratosis: Secondary | ICD-10-CM | POA: Diagnosis not present

## 2020-01-17 DIAGNOSIS — L814 Other melanin hyperpigmentation: Secondary | ICD-10-CM | POA: Diagnosis not present

## 2020-01-17 DIAGNOSIS — L579 Skin changes due to chronic exposure to nonionizing radiation, unspecified: Secondary | ICD-10-CM | POA: Diagnosis not present

## 2020-01-17 DIAGNOSIS — D2372 Other benign neoplasm of skin of left lower limb, including hip: Secondary | ICD-10-CM | POA: Diagnosis not present

## 2020-01-17 DIAGNOSIS — L821 Other seborrheic keratosis: Secondary | ICD-10-CM | POA: Diagnosis not present

## 2020-01-22 ENCOUNTER — Ambulatory Visit: Payer: Medicare Other | Attending: Physical Medicine and Rehabilitation

## 2020-01-22 ENCOUNTER — Other Ambulatory Visit: Payer: Self-pay

## 2020-01-22 DIAGNOSIS — R0781 Pleurodynia: Secondary | ICD-10-CM | POA: Insufficient documentation

## 2020-01-22 DIAGNOSIS — R252 Cramp and spasm: Secondary | ICD-10-CM | POA: Diagnosis not present

## 2020-01-22 DIAGNOSIS — M5441 Lumbago with sciatica, right side: Secondary | ICD-10-CM | POA: Insufficient documentation

## 2020-01-22 DIAGNOSIS — G8929 Other chronic pain: Secondary | ICD-10-CM | POA: Diagnosis not present

## 2020-01-22 NOTE — Therapy (Signed)
[place], [place], [address] Phone: [phone]   Fax:  [phone]  Physical Therapy Evaluation  Patient Details  Name: Jacob Kerr MRN: [id number] Date of Birth: 1951-08-14 Referring Provider (PT): Leeroy Cha, MD   Encounter Date: 01/22/2020  PT End of Session - 01/22/20 1050    Visit Number  1    Number of Visits  10    Date for PT Re-Evaluation  02/22/20    Authorization Type  [employer] MCR    PT Start Time  1000    PT Stop Time  1046    PT Time Calculation (min)  46 min    Activity Tolerance  Patient tolerated treatment well;Patient limited by pain    Behavior During Therapy  [facility] for tasks assessed/performed       Past Medical History:  Diagnosis Date  . Acute meniscal tear of left knee   . Allergic rhinitis   . Anal fissure   . Anxiety   . BPH (benign prostatic hypertrophy)   . Cervical spondylosis without myelopathy   . Cervicogenic headache   . Chronic headaches   . Chronic neck pain    uses TENS unit  prn  . Diverticulitis   . Gallstones   . GERD (gastroesophageal reflux disease)   . H/O hiatal hernia   . History of diverticulitis   . History of kidney stones   . History of TIA (transient ischemic attack)    04/ 2011---  no residuals  . Hypertension   . Hypothyroidism   . Peripheral neuropathy   . Shingles   . Syrinx of spinal cord ([place])    C4 -- C7    Past Surgical History:  Procedure Laterality Date  . CARDIAC CATHETERIZATION  02-18-2010   dr Angelena Form   normal coronary arteries/  normal lvsf--  ef 65%  . COLONOSCOPY    . [place]  . HERNIA REPAIR     [facility] [place] [facility]  . KNEE ARTHROSCOPY Left 03/13/2014   Procedure: LEFT ARTHROSCOPY KNEE WITH DEBRIDMENT;  Surgeon: Gearlean Alf, MD;  Location[facility];  Service: Orthopedics;  Laterality: Left;  . LAPAROSCOPIC CHOLECYSTECTOMY  01-30-2003  . NEGATIVE SLEEP STUDY  2013   per pt  . RADIOFREQUENCY ABLATION NERVES  05/2016  . REMOVAL FORGEIN BODY FROM EAR  AGE 80  . RIGHT URETEROSCOPIC LASER LITHOTRIPSY STONE EXTRACTION /  STENT PLACEMENT  05-10-2000  . TRANSTHORACIC ECHOCARDIOGRAM  01-16-2010   normal lvf/  ef  60%/  mild lae    There were no vitals filed for this visit.   Subjective Assessment - 01/22/20 1005    Subjective  He reports he is here with constant back and rib pain and hip /groin pain.    More on RT.     Feels massage and Electreic stim helped.  farms and works on Art therapist.    Pertinent History  Fe ll at work 5 years ago and pain increased. Does all HEP    Limitations  --   deep breath.  reaching, moving   Currently in Pain?  Yes    Pain Location  --   RT back and ribs , head ache   Pain Orientation  Right    Pain Descriptors / Indicators  Aching;Sharp;Spasm    Pain Type  Chronic pain    Pain Onset  More than a month ago    Pain Frequency  Constant  Aggravating Factors   activity    Pain Relieving Factors  lying , hot water         [facility] PT Assessment - 01/22/20 0001      Assessment   Medical Diagnosis  Chronic back painRT    Referring Provider (PT)  Leeroy Cha, MD    Onset Date/Surgical Date  --   5 years ago   Next MD Visit  [place] [facility]    Prior Therapy  Yes for incontinence and bak pain      Precautions   Precautions  None      Restrictions   Weight Bearing Restrictions  No      Balance Screen   Has the patient fallen in the past 6 months  Yes    How many times?  multiple times    Has the patient had a decrease in activity level because of a fear of falling?   No    Is the patient reluctant to leave their home because of a fear of falling?   No      Prior Function   Level of Independence  Independent      Cognition   Overall Cognitive Status  Within Functional Limits for tasks assessed      Posture/Postural Control   Posture Comments  RT shoulder lower Lt ribs slightly flared. , Lt leg functionally shorted        ROM / Strength   AROM / PROM / Strength  AROM;PROM;Strength      AROM   Overall AROM Comments  Shoulder  ROM normal    AROM Assessment Site  Lumbar    Lumbar Flexion  WNL     Lumbar Extension  WNL    Lumbar - Right Side Bend  WNL    Lumbar - Left Side Bend  WNL      Strength   Overall Strength Comments  His normal       Flexibility   Soft Tissue Assessment /Muscle Length  yes    Hamstrings  WNL    ITB  mild + ober's RT ? Lt       Palpation   Palpation comment  PA glides  no incr pain  lumbar and thoracic                Objective measurements completed on examination: See above findings.              PT Education - 01/22/20 1013    Education Details  POC   HEP    Person(s) Educated  Patient    Methods  Explanation;Demonstration;Tactile cues;Verbal cues;Handout    Comprehension  Verbalized understanding;Returned demonstration;Need further instruction          PT Long Term Goals - 01/22/20 1057      PT LONG TERM GOAL #1   Title  Independent with a HEP.    Time  5    Period  Weeks    Status  New      PT LONG TERM GOAL #2   Title  He will report pain RT side easd 50% with normal activity requirung less time lying of comfort    Time  5    Period  Weeks    Status  New      PT LONG TERM GOAL #5   Title  able to play with his 43 year old grandson with 50% less  pain due to improve mobilty and decreased in muscle spasms  Time  5    Period  Weeks             Plan - 01/22/20 1051    Clinical Impression Statement  [name] presents with chronic RT sided pain that has been see in PT in past with limited  and no long lasting benefit. His ROM in spine and LE are normal.  He is tender on RT flank but PA glides to spine did not cause any pain. He was moving the entire time to stretch. I issued a pair of exercises to start for postural asymetries. Will trial 6 visits and 10 max to establish a HEP for opposing RT muscles that are painful to  elongate and facilitate muscles in opposition to RT side.    Personal Factors and Comorbidities  Past/Current Experience;Time since onset of injury/illness/exacerbation;Comorbidity 1;Comorbidity 2    Stability/Clinical Decision Making  Evolving/Moderate complexity    Clinical Decision Making  Moderate    Rehab Potential  Fair    PT Frequency  2x / week    PT Duration  --   5 weeks   PT Treatment/Interventions  Therapeutic exercise;Patient/family education;Manual techniques;Dry needling    PT Next Visit Plan  Review HEP and add to exercises as needed , possible manual/heat    PT Home Exercise Plan  LT legf lift with IR Lt leg,  RT side lye with posterior slide left thigh and adduction squeeze    Consulted and Agree with Plan of Care  Patient       Patient will benefit from skilled therapeutic intervention in order to improve the following deficits and impairments:  Pain, Postural dysfunction  Visit Diagnosis: Chronic bilateral low back pain with right-sided sciatica  Cramp and spasm  Rib pain on right side     Problem List Patient Active Problem List   Diagnosis Date Noted  . Anxiety 10/04/2017  . Nonallopathic lesion of rib cage 06/15/2017  . Nonallopathic lesion of cervical region 05/18/2017  . Nonallopathic lesion of thoracic region 05/18/2017  . Nonallopathic lesion of lumbosacral region 05/18/2017  . Arrhythmia 10/08/2014  . TIA (transient ischemic attack) 08/09/2014  . Lateral meniscal tear 03/12/2014  . Testosterone deficiency 07/04/2013  . Cervicogenic headache 03/28/2013  . Vitamin D deficiency 01/01/2013  . Hyperlipidemia 01/01/2013  . BPH (benign prostatic hyperplasia) 11/03/2012  . Diverticulosis large intestine w/o perforation or abscess w/bleeding 11/03/2012  . Peripheral neuropathy 11/03/2012  . Hypothyroidism 01/18/2011  . Essential hypertension 01/18/2011  . GERD (gastroesophageal reflux disease) 01/18/2011  . Cervical spondylosis without myelopathy  11/24/2010  . Headache(784.0) 11/24/2010  . Sleep disturbance, unspecified 11/24/2010  . Syringomyelia and syringobulbia ([place]) 11/24/2010  . Disturbance of skin sensation 11/24/2010  . Cerebrovascular disease, unspecified 11/24/2010    Darrel Hoover  PT 01/22/2020, 11:03 AM  [facility] [address] [place], [place], [address] Phone: [phone]   Fax:  [phone]  Name: Jacob Kerr MRN: [id number] Date of Birth: [birth year]

## 2020-01-22 NOTE — Patient Instructions (Signed)
Standing with lift of Lt pelvis and leg with IR of Lt leg hold x 5 breaths of 10 sec  2x/day RT side lye with Lt foot above Lt knee with inhale to pull Lt thigh back and adduct LT knee and holds 5 breaths or 10 sec.  5 reps 2x/day

## 2020-01-24 ENCOUNTER — Encounter: Payer: Self-pay | Admitting: Family Medicine

## 2020-01-25 DIAGNOSIS — M25521 Pain in right elbow: Secondary | ICD-10-CM | POA: Diagnosis not present

## 2020-01-28 ENCOUNTER — Ambulatory Visit: Payer: Medicare Other

## 2020-02-02 ENCOUNTER — Other Ambulatory Visit: Payer: Self-pay | Admitting: Physical Medicine and Rehabilitation

## 2020-02-05 ENCOUNTER — Other Ambulatory Visit: Payer: Self-pay

## 2020-02-05 ENCOUNTER — Ambulatory Visit: Payer: Medicare Other | Attending: Physical Medicine and Rehabilitation

## 2020-02-05 DIAGNOSIS — M546 Pain in thoracic spine: Secondary | ICD-10-CM | POA: Insufficient documentation

## 2020-02-05 DIAGNOSIS — R252 Cramp and spasm: Secondary | ICD-10-CM | POA: Insufficient documentation

## 2020-02-05 DIAGNOSIS — M5441 Lumbago with sciatica, right side: Secondary | ICD-10-CM | POA: Insufficient documentation

## 2020-02-05 DIAGNOSIS — G8929 Other chronic pain: Secondary | ICD-10-CM | POA: Diagnosis not present

## 2020-02-05 DIAGNOSIS — R0781 Pleurodynia: Secondary | ICD-10-CM | POA: Diagnosis not present

## 2020-02-05 DIAGNOSIS — M6281 Muscle weakness (generalized): Secondary | ICD-10-CM | POA: Diagnosis not present

## 2020-02-05 NOTE — Therapy (Signed)
[place], [place], [address] Phone: [phone]   Fax:  [phone]  Physical Therapy Treatment  Patient Details  Name: Jacob Kerr MRN: [id number] Date of Birth: 13-May-1951 Referring Provider (PT): Leeroy Cha, MD   Encounter Date: 02/05/2020  PT End of Session - 02/05/20 0841    Visit Number  2    Number of Visits  10    Date for PT Re-Evaluation  02/22/20    Authorization Type  [employer] MCR    PT Start Time  0836    PT Stop Time  0915    PT Time Calculation (min)  39 min    Activity Tolerance  Patient tolerated treatment well;Patient limited by pain    Behavior During Therapy  [facility] for tasks assessed/performed       Past Medical History:  Diagnosis Date  . Acute meniscal tear of left knee   . Allergic rhinitis   . Anal fissure   . Anxiety   . BPH (benign prostatic hypertrophy)   . Cervical spondylosis without myelopathy   . Cervicogenic headache   . Chronic headaches   . Chronic neck pain    uses TENS unit  prn  . Diverticulitis   . Gallstones   . GERD (gastroesophageal reflux disease)   . H/O hiatal hernia   . History of diverticulitis   . History of kidney stones   . History of TIA (transient ischemic attack)    04/ 2011---  no residuals  . Hypertension   . Hypothyroidism   . Peripheral neuropathy   . Shingles   . Syrinx of spinal cord ([place])    C4 -- C7    Past Surgical History:  Procedure Laterality Date  . CARDIAC CATHETERIZATION  02-18-2010   dr Angelena Form   normal coronary arteries/  normal lvsf--  ef 65%  . COLONOSCOPY    . [place]  . HERNIA REPAIR     [facility] [place] [facility]  . KNEE ARTHROSCOPY Left 03/13/2014   Procedure: LEFT ARTHROSCOPY KNEE WITH DEBRIDMENT;  Surgeon: Gearlean Alf, MD;  Location: [facility];  Service: Orthopedics;  Laterality: Left;  . LAPAROSCOPIC CHOLECYSTECTOMY  01-30-2003  . NEGATIVE SLEEP STUDY  2013   per pt  . RADIOFREQUENCY ABLATION NERVES  05/2016  . REMOVAL FORGEIN BODY FROM EAR  AGE 69  . RIGHT URETEROSCOPIC LASER LITHOTRIPSY STONE EXTRACTION /  STENT PLACEMENT  05-10-2000  . TRANSTHORACIC ECHOCARDIOGRAM  01-16-2010   normal lvf/  ef  60%/  mild lae    There were no vitals filed for this visit.  Subjective Assessment - 02/05/20 0840    Subjective  Felt bad last week so did not come in but better now. back pain same.    Pain Location  --   RT back and ribs   Pain Orientation  Right    Pain Descriptors / Indicators  Aching;Sharp;Spasm    Pain Type  Chronic pain    Pain Onset  More than a month ago    Pain Frequency  Constant    Aggravating Factors   activity    Pain Relieving Factors  lying own , hot water                       [facility] Adult PT Treatment/Exercise - 02/05/20 0001      Exercises   Exercises  Lumbar      Lumbar Exercises:  Aerobic   Nustep  UE?LE L 4 5 min      Lumbar Exercises: Standing   Other Standing Lumbar Exercises  Left leg lift from step with trunk Lt sidebend with pelvis elevation x 6 10 [place] with hip IR, cued too level shoulders      Lumbar Exercises: Supine   Other Supine Lumbar Exercises  PPT with  legs on  green ball  heels into ball and lifting of Lt leg  ,cued to maintain  PPT .  x 6 10 sec ,   than feet on 4 in platform PPT with Lt pelvic retraction  and red bandsRT clam x 6 10 sec, then    PPT  with Lt retraction Lt pelvis and IR  redband x 6 10 sec hold      Manual Therapy   Manual Therapy  Passive ROM;Manual Traction    Passive ROM  lateral  flexion to Lt and LTR to Lt     Manual Traction  RT leg pull                  PT Long Term Goals - 01/22/20 1057      PT LONG TERM GOAL #1   Title  Independent with a HEP.    Time  5    Period  Weeks    Status  New      PT LONG TERM GOAL #2   Title  He will report pain RT side easd 50% with normal activity requirung less time lying of comfort    Time  5    Period   Weeks    Status  New      PT LONG TERM GOAL #5   Title  able to play with his 69 year old grandson with 50% less  pain due to improve mobilty and decreased in muscle spasms    Time  5    Period  Weeks            Plan - 02/05/20 0842    Clinical Impression Statement  No increASES PAIN POST AND FELT FINE THOUGH STILL WITH PAIN AND FEELS TIGHT.    wILL ADD TO hep NET SESSION. aSKED him to do initiald HEP 2x/day.    PT Treatment/Interventions  Therapeutic exercise;Patient/family education;Manual techniques;Dry needling    PT Next Visit Plan  Review HEP and add to exercises as needed , possible manual/heat    PT Home Exercise Plan  LT legf lift with IR Lt leg,  RT side lye with posterior slide left thigh and adduction squeeze    Consulted and Agree with Plan of Care  Patient       Patient will benefit from skilled therapeutic intervention in order to improve the following deficits and impairments:  Pain, Postural dysfunction, Increased muscle spasms  Visit Diagnosis: Chronic bilateral low back pain with right-sided sciatica  Cramp and spasm  Rib pain on right side  Muscle weakness (generalized)  Pain in thoracic spine     Problem List Patient Active Problem List   Diagnosis Date Noted  . Anxiety 10/04/2017  . Nonallopathic lesion of rib cage 06/15/2017  . Nonallopathic lesion of cervical region 05/18/2017  . Nonallopathic lesion of thoracic region 05/18/2017  . Nonallopathic lesion of lumbosacral region 05/18/2017  . Arrhythmia 10/08/2014  . TIA (transient ischemic attack) 08/09/2014  . Lateral meniscal tear 03/12/2014  . Testosterone deficiency 07/04/2013  . Cervicogenic headache 03/28/2013  . Vitamin D deficiency 01/01/2013  .  Hyperlipidemia 01/01/2013  . BPH (benign prostatic hyperplasia) 11/03/2012  . Diverticulosis large intestine w/o perforation or abscess w/bleeding 11/03/2012  . Peripheral neuropathy 11/03/2012  . Hypothyroidism 01/18/2011  . Essential  hypertension 01/18/2011  . GERD (gastroesophageal reflux disease) 01/18/2011  . Cervical spondylosis without myelopathy 11/24/2010  . Headache(784.0) 11/24/2010  . Sleep disturbance, unspecified 11/24/2010  . Syringomyelia and syringobulbia ([place]) 11/24/2010  . Disturbance of skin sensation 11/24/2010  . Cerebrovascular disease, unspecified 11/24/2010    Darrel Hoover  PT 02/05/2020, 9:20 AM  [facility] [address] [place], [place], [address] Phone: [phone]   Fax:  [phone]  Name: Jacob Kerr MRN: [id number] Date of Birth: 11/15/1950

## 2020-02-07 ENCOUNTER — Ambulatory Visit: Payer: Medicare Other

## 2020-02-07 ENCOUNTER — Ambulatory Visit (INDEPENDENT_AMBULATORY_CARE_PROVIDER_SITE_OTHER): Payer: Medicare Other | Admitting: *Deleted

## 2020-02-07 ENCOUNTER — Other Ambulatory Visit: Payer: Self-pay

## 2020-02-07 DIAGNOSIS — Z0000 Encounter for general adult medical examination without abnormal findings: Secondary | ICD-10-CM | POA: Diagnosis not present

## 2020-02-07 DIAGNOSIS — M5441 Lumbago with sciatica, right side: Secondary | ICD-10-CM

## 2020-02-07 DIAGNOSIS — G8929 Other chronic pain: Secondary | ICD-10-CM | POA: Diagnosis not present

## 2020-02-07 DIAGNOSIS — R0781 Pleurodynia: Secondary | ICD-10-CM | POA: Diagnosis not present

## 2020-02-07 DIAGNOSIS — M546 Pain in thoracic spine: Secondary | ICD-10-CM | POA: Diagnosis not present

## 2020-02-07 DIAGNOSIS — R252 Cramp and spasm: Secondary | ICD-10-CM | POA: Diagnosis not present

## 2020-02-07 DIAGNOSIS — M6281 Muscle weakness (generalized): Secondary | ICD-10-CM | POA: Diagnosis not present

## 2020-02-07 NOTE — Patient Instructions (Signed)
Issued band red for supine feet elevated 2-4 in hook lye woith PPT and shift right pelvis forward  Lift LT foot and IR with adduction pull band laterally x 5 reps 10 sec 2x/day

## 2020-02-07 NOTE — Progress Notes (Signed)
MEDICARE ANNUAL WELLNESS VISIT  02/07/2020  Telephone Visit Disclaimer This Medicare AWV was conducted by telephone due to national recommendations for restrictions regarding the COVID-19 Pandemic (e.g. social distancing).  I verified, using two identifiers, that I am speaking with Jacob Kerr or their authorized healthcare agent. I discussed the limitations, risks, security, and privacy concerns of performing an evaluation and management service by telephone and the potential availability of an in-person appointment in the future. The patient expressed understanding and agreed to proceed.   Subjective:  Jacob Kerr is a 69 y.o. male patient of Jacob Norlander, DO who had a Medicare Annual Wellness Visit today via telephone. Jacob Kerr is retired and lives with his wife Jacob Kerr. He has 2 children. He reports that he is socially active and does interact with friends/family regularly. He is minimally physically active and enjoys spending time with his grandchildren.   Patient Care Team: Jacob Norlander, DO as PCP - General (Family Medicine) Jacob Kerr., Kerr (Neurosurgery) Jacob Feil, Kerr (Gastroenterology) Jacob Ducking, Kerr (Neurology) Jacob Kerr, Kerr (Nephrology) Jacob Breeding, Kerr as Consulting Physician (Cardiology) Jacob Norlander, DO as Consulting Physician (Family Medicine)  Advanced Directives 02/07/2020 01/22/2020 11/08/2019 04/11/2019 02/06/2019 05/18/2018 02/02/2016  Does Patient Have a Medical Advance Directive? No No No No No No No  Would patient like information on creating a medical advance directive? No - Patient declined No - Patient declined Yes (MAU/Ambulatory/Procedural Areas - Information given) No - Patient declined Yes (MAU/Ambulatory/Procedural Areas - Information given) - -    Hospital Utilization Over the Past 12 Months: # of hospitalizations or ER visits: 0 # of surgeries: 0  Review of Systems    Patient reports that his overall  health is better compared to last year.  History obtained from the patient and patient chart.   Patient Reported Readings (BP, Pulse, CBG, Weight, etc) none  Pain Assessment Pain : 0-10 Pain Score: 4  Pain Type: Chronic pain Pain Location: Rib cage Pain Orientation: Right Pain Descriptors / Indicators: Aching, Spasm, Sharp Pain Onset: More than a month ago Pain Frequency: Constant Pain Relieving Factors: lying own, hot water Effect of Pain on Daily Activities: "makes me grouchy"  Pain Relieving Factors: lying own, hot water  Current Medications & Allergies (verified) Allergies as of 02/07/2020      Reactions   Cymbalta [duloxetine Hcl] Other (See Comments)   Depression   Lyrica [pregabalin] Other (See Comments)   Paranoid   Neurontin [gabapentin] Other (See Comments)   nightmares   Robaxin [methocarbamol] Other (See Comments)   Seeing "bright lights"   Sudafed [pseudoephedrine Hcl] Other (See Comments)   Increased heart rate   Epinephrine Palpitations   Decreased blood pressure      Medication List       Accurate as of Feb 07, 2020  2:43 PM. If you have any questions, ask your nurse or doctor.        amitriptyline 10 MG tablet Commonly known as: ELAVIL TAKE 1 TABLET BY MOUTH EVERYDAY AT BEDTIME   amLODipine 10 MG tablet Commonly known as: NORVASC TAKE 1 TABLET BY MOUTH  DAILY   aspirin 81 MG tablet Take 81 mg by mouth at bedtime.   azelastine 0.1 % nasal spray Commonly known as: ASTELIN PLACE 2 SPRAYS INTO BOTH NOSTRILS 2 (TWO) TIMES DAILY.   baclofen 10 MG tablet Commonly known as: LIORESAL Take 1 tablet (10 mg total) by mouth 3 (three) times daily.  Co Q 10 100 MG Caps Take 1 capsule by mouth daily.   ergocalciferol 1.25 MG (50000 UT) capsule Commonly known as: VITAMIN D2 Take 1 capsule (50,000 Units total) by mouth 2 (two) times a week.   levothyroxine 150 MCG tablet Commonly known as: SYNTHROID TAKE 1 TABLET BY MOUTH  DAILY BEFORE BREAKFAST   NEEDS TO BE SEEN FOR  FURTHER REFILLS   LORazepam 0.5 MG tablet Commonly known as: ATIVAN Take 1 tablet (0.5 mg total) by mouth 2 (two) times daily as needed for anxiety. What changed: when to take this   losartan 50 MG tablet Commonly known as: COZAAR TAKE 1 TABLET BY MOUTH  DAILY   NON FORMULARY Take 3 capsules by mouth every morning. Nugenix Testosterone Booster / [place]   omeprazole 20 MG capsule Commonly known as: PRILOSEC TAKE 1 CAPSULE BY MOUTH TWO TIMES DAILY   UNABLE TO FIND Med Name: CBD oil   venlafaxine XR 75 MG 24 hr capsule Commonly known as: EFFEXOR-XR TAKE 1 CAPSULE BY MOUTH  DAILY WITH BREAKFAST       History (reviewed): Past Medical History:  Diagnosis Date  . Acute meniscal tear of left knee   . Allergic rhinitis   . Anal fissure   . Anxiety   . BPH (benign prostatic hypertrophy)   . Cervical spondylosis without myelopathy   . Cervicogenic headache   . Chronic headaches   . Chronic neck pain    uses TENS unit  prn  . Diverticulitis   . Gallstones   . GERD (gastroesophageal reflux disease)   . H/O hiatal hernia   . History of diverticulitis   . History of kidney stones   . History of TIA (transient ischemic attack)    04/ 2011---  no residuals  . Hypertension   . Hypothyroidism   . Peripheral neuropathy   . Shingles   . Syrinx of spinal cord ([place])    C4 -- C7   Past Surgical History:  Procedure Laterality Date  . CARDIAC CATHETERIZATION  02-18-2010   dr Angelena Form   normal coronary arteries/  normal lvsf--  ef 65%  . COLONOSCOPY    . [place]  . HERNIA REPAIR     [facility] [place] [facility]  . KNEE ARTHROSCOPY Left 03/13/2014   Procedure: LEFT ARTHROSCOPY KNEE WITH DEBRIDMENT;  Surgeon: Gearlean Alf, Kerr;  Location: [facility];  Service: Orthopedics;  Laterality: Left;  . LAPAROSCOPIC CHOLECYSTECTOMY  01-30-2003  . NEGATIVE SLEEP STUDY  2013   per pt  . RADIOFREQUENCY ABLATION NERVES  05/2016  . REMOVAL  FORGEIN BODY FROM EAR  AGE 32  . RIGHT URETEROSCOPIC LASER LITHOTRIPSY STONE EXTRACTION /  STENT PLACEMENT  05-10-2000  . TRANSTHORACIC ECHOCARDIOGRAM  01-16-2010   normal lvf/  ef  60%/  mild lae   Family History  Problem Relation Age of Onset  . Prostate cancer Father   . Heart disease Father   . Stroke Father   . Nephrolithiasis Sister   . Cancer Sister        Intestinal  . Hyperlipidemia Sister   . Hyperlipidemia Sister   . Cancer Sister        Sinus  . COPD Other   . Colon polyps Brother        x2  . Colon polyps Sister   . Colon cancer Neg Hx   . Esophageal cancer Neg Hx    Social History   Socioeconomic History  . Marital  status: Married    Spouse name: [name]  . Number of children: 2  . Years of education: 75  . Highest education level: 12th grade  Occupational History  . Occupation: Retired    Fish farm manager: Chief of Staff  Tobacco Use  . Smoking status: Never Smoker  . Smokeless tobacco: Never Used  Substance and Sexual Activity  . Alcohol use: Not Currently    Alcohol/week: 7.0 standard drinks    Types: 7 Shots of liquor per week    Comment: 2 at night  . Drug use: No  . Sexual activity: Not on file  Other Topics Concern  . Not on file  Social History Narrative   Lives at home with wife.       Patient is right handed.   Patient drinks 1-2 cups of caffeine daily.   Social Determinants of Health   Financial Resource Strain:   . Difficulty of Paying Living Expenses:   Food Insecurity:   . Worried About Charity fundraiser in the Last Year:   . Arboriculturist in the Last Year:   Transportation Needs:   . Film/video editor (Medical):   Marland Kitchen Lack of Transportation (Non-Medical):   Physical Activity:   . Days of Exercise per Week:   . Minutes of Exercise per Session:   Stress:   . Feeling of Stress :   Social Connections:   . Frequency of Communication with Friends and Family:   . Frequency of Social Gatherings with Friends and Family:   . Attends  Religious Services:   . Active Member of Clubs or Organizations:   . Attends Archivist Meetings:   Marland Kitchen Marital Status:     Activities of Daily Living In your present state of health, do you have any difficulty performing the following activities: 02/07/2020  Hearing? N  Vision? N  Difficulty concentrating or making decisions? N  Walking or climbing stairs? N  Dressing or bathing? N  Doing errands, shopping? N  Preparing Food and eating ? N  Using the Toilet? N  In the past six months, have you accidently leaked urine? Y  Do you have problems with loss of bowel control? Y  Comment has had pelvic floor therapy  Managing your Medications? N  Managing your Finances? N  Housekeeping or managing your Housekeeping? N  Some recent data might be hidden    Patient Education/ Literacy How often do you need to have someone help you when you read instructions, pamphlets, or other written materials from your doctor or pharmacy?: 1 - Never What is the last grade level you completed in school?: 12  Exercise Current Exercise Habits: Home exercise routine, Type of exercise: yoga;stretching;walking, Time (Minutes): 30, Frequency (Times/Week): 7, Weekly Exercise (Minutes/Week): 210, Intensity: Mild  Diet Patient reports consuming 2 meals a day and 1 snack(s) a day Patient reports that his primary diet is: Regular Patient reports that she does have regular access to food.   Depression Screen PHQ 2/9 Scores 02/07/2020 01/10/2020 06/14/2019 03/28/2019 02/06/2019 12/05/2018 11/06/2018  PHQ - 2 Score 0 0 0 0 0 1 3  PHQ- 9 Score - 10 - - - - 10     Fall Risk Fall Risk  02/07/2020 01/10/2020 06/14/2019 03/28/2019 02/06/2019  Falls in the past year? 1 1 0 0 1  Number falls in past yr: 1 1 - - 1  Comment - 5 - - -  Injury with Fall? 0 0 - - 0  Risk  for fall due to : Impaired balance/gait History of fall(s) - - Impaired balance/gait  Follow up Falls prevention discussed - - - Falls prevention  discussed;Education provided     Objective:  Jacob Kerr seemed alert and oriented and he participated appropriately during our telephone visit.  Blood Pressure Weight BMI  BP Readings from Last 3 Encounters:  01/10/20 (!) 158/83  11/08/19 (!) 158/92  07/11/19 132/80   Wt Readings from Last 3 Encounters:  01/10/20 198 lb (89.8 kg)  11/08/19 205 lb 4.8 oz (93.1 kg)  07/11/19 207 lb (93.9 kg)   BMI Readings from Last 1 Encounters:  01/10/20 26.85 kg/m    *Unable to obtain current vital signs, weight, and BMI due to telephone visit type  Hearing/Vision  . Hershy did not seem to have difficulty with hearing/understanding during the telephone conversation . Reports that he has not had a formal eye exam by an eye care professional within the past year . Reports that he has not had a formal hearing evaluation within the past year *Unable to fully assess hearing and vision during telephone visit type  Cognitive Function: 6CIT Screen 02/07/2020 02/06/2019  What Year? 0 points 0 points  What month? 0 points 0 points  What time? 0 points 0 points  Count back from 20 0 points 0 points  Months in reverse 0 points 2 points  Repeat phrase 0 points 0 points  Total Score 0 2   (Normal:0-7, Significant for Dysfunction: >8)  Normal Cognitive Function Screening: Yes   Immunization & Health Maintenance Record Immunization History  Administered Date(s) Administered  . Influenza Whole 06/27/2012  . Influenza, High Dose Seasonal PF 07/21/2018  . Influenza,inj,Quad PF,6+ Mos 07/04/2013, 07/12/2014, 07/31/2015, 08/18/2016  . Influenza-Unspecified 07/13/2017  . Pneumococcal Conjugate-13 03/07/2018  . Pneumococcal Polysaccharide-23 03/28/2019  . Tdap 09/27/2005, 06/23/2011  . Zoster 05/20/2014    Health Maintenance  Topic Date Due  . COVID-19 Vaccine (1) 02/23/2020 (Originally 09/14/1967)  . INFLUENZA VACCINE  04/27/2020  . TETANUS/TDAP  06/22/2021  . COLONOSCOPY  12/29/2024  .  Hepatitis C Screening  Completed  . PNA vac Low Risk Adult  Completed       Assessment  This is a routine wellness examination for [employer].  Health Maintenance: Due or Overdue There are no preventive care reminders to display for this patient.  Jacob Kerr does not need a referral for [employer] Assistance: Care Management:   no Social Work:    no Prescription Assistance:  no Nutrition/Diabetes Education:  no   Plan:  Personalized Goals Goals Addressed            This Visit's Progress   . Patient Stated       02/07/2020 AWV Goal: Fall Prevention  . Over the next year, patient will decrease their risk for falls by: o Using assistive devices, such as a cane or walker, as needed o Identifying fall risks within their home and correcting them by: - Removing throw rugs - Adding handrails to stairs or ramps - Removing clutter and keeping a clear pathway throughout the home - Increasing light, especially at night - Adding shower handles/bars - Raising toilet seat o Identifying potential personal risk factors for falls: - Medication side effects - Incontinence/urgency - Vestibular dysfunction - Hearing loss - Musculoskeletal disorders - Neurological disorders - Orthostatic hypotension        Personalized Health Maintenance & Screening Recommendations    Lung Cancer Screening Recommended: no (Low Dose CT  Chest recommended if Age 33-80 years, 30 pack-year currently smoking OR have quit w/in past 15 years) Hepatitis C Screening recommended: no HIV Screening recommended: no  Advanced Directives: Written information was not prepared per patient's request.  Referrals & Orders No orders of the defined types were placed in this encounter.   Follow-up Plan . Follow-up with Jacob Norlander, DO as planned   I have personally reviewed and noted the following in the patient's chart:   . Medical and social history . Use of alcohol, tobacco or illicit drugs   . Current medications and supplements . Functional ability and status . Nutritional status . Physical activity . Advanced directives . List of other physicians . Hospitalizations, surgeries, and ER visits in previous 12 months . Vitals . Screenings to include cognitive, depression, and falls . Referrals and appointments  In addition, I have reviewed and discussed with Jacob Kerr certain preventive protocols, quality metrics, and best practice recommendations. A written personalized care plan for preventive services as well as general preventive health recommendations is available and can be mailed to the patient at his request.     Baldomero Lamy, LPN  [id number]

## 2020-02-07 NOTE — Therapy (Signed)
[place], [place], [address] Phone: [phone]   Fax:  [phone]  Physical Therapy Treatment  Patient Details  Name: Jacob Kerr MRN: [id number] Date of Birth: [birth year] Referring Provider (PT): Leeroy Cha, MD   Encounter Date: 02/07/2020  PT End of Session - 02/07/20 0836    Visit Number  3    Number of Visits  10    Date for PT Re-Evaluation  02/22/20    Authorization Type  U[employer] MCR    PT Start Time  0831    PT Stop Time  0915    PT Time Calculation (min)  44 min    Activity Tolerance  Patient tolerated treatment well;Patient limited by pain    Behavior During Therapy  [facility] for tasks assessed/performed       Past Medical History:  Diagnosis Date  . Acute meniscal tear of left knee   . Allergic rhinitis   . Anal fissure   . Anxiety   . BPH (benign prostatic hypertrophy)   . Cervical spondylosis without myelopathy   . Cervicogenic headache   . Chronic headaches   . Chronic neck pain    uses TENS unit  prn  . Diverticulitis   . Gallstones   . GERD (gastroesophageal reflux disease)   . H/O hiatal hernia   . History of diverticulitis   . History of kidney stones   . History of TIA (transient ischemic attack)    04/ 2011---  no residuals  . Hypertension   . Hypothyroidism   . Peripheral neuropathy   . Shingles   . Syrinx of spinal cord ([place])    C4 -- C7    Past Surgical History:  Procedure Laterality Date  . CARDIAC CATHETERIZATION  02-18-2010   dr Angelena Form   normal coronary arteries/  normal lvsf--  ef 65%  . COLONOSCOPY    . [place]  . HERNIA REPAIR     [facility] [place] [facility]  . KNEE ARTHROSCOPY Left 03/13/2014   Procedure: LEFT ARTHROSCOPY KNEE WITH DEBRIDMENT;  Surgeon: Gearlean Alf, MD;  Location: [facility];  Service: Orthopedics;  Laterality: Left;  . LAPAROSCOPIC CHOLECYSTECTOMY  01-30-2003  . NEGATIVE SLEEP STUDY  2013   per pt  . RADIOFREQUENCY ABLATION NERVES  05/2016  . REMOVAL FORGEIN BODY FROM EAR  AGE 24  . RIGHT URETEROSCOPIC LASER LITHOTRIPSY STONE EXTRACTION /  STENT PLACEMENT  05-10-2000  . TRANSTHORACIC ECHOCARDIOGRAM  01-16-2010   normal lvf/  ef  60%/  mild lae    There were no vitals filed for this visit.  Subjective Assessment - 02/07/20 0835    Subjective  Everything the same    Pain Location  --   RT back and ribs   Pain Orientation  Right    Pain Descriptors / Indicators  Aching;Sharp;Spasm    Pain Type  Chronic pain    Pain Onset  More than a month ago    Pain Frequency  Constant                        [facility] Adult PT Treatment/Exercise - 02/07/20 0001      Lumbar Exercises: Stretches   Other Lumbar Stretch Exercise  lage green ball forward flex AND  REACH TO LEFT X 7 EACH      Lumbar Exercises: Aerobic   Nustep  UE?LE L 4  7 min  Lumbar Exercises: Standing   Other Standing Lumbar Exercises  Left leg lift from step with trunk Lt sidebend with pelvis elevation x 8 10 [place] with hip IR, cued too level shoulders      Lumbar Exercises: Supine   Other Supine Lumbar Exercises  PPT with  legs on  green ball  heels into ball and lifting of Lt leg  ,cued to maintain  PPT .  x 6 10 sec ,   than feet on 4 in platform PPT with Lt pelvic retraction  and red bandsRT clam x 6 10 sec, then    PPT  with Lt retraction Lt pelvis and IR  redband x 6 10 sec hold      Manual Therapy   Manual Traction  RT leg pull             PT Education - 02/07/20 0913    Education Details  HEP    Person(s) Educated  Patient    Methods  Explanation;Tactile cues;Verbal cues;Handout    Comprehension  Verbalized understanding;Returned demonstration          PT Long Term Goals - 02/07/20 0837      PT LONG TERM GOAL #1   Title  Independent with a HEP.    Status  On-going      PT LONG TERM GOAL #2   Title  He will report pain RT side easd 50% with normal activity requirung less  time lying of comfort    Status  On-going            Plan - 02/07/20 0837    Clinical Impression Statement  No chANGE IN PAIN BUT ABLE TO DO ALL EXERCISE CORRECTLY WITH MINOR CUES.  Explained about facilitation of muscles opposite to side of pain nad elongation of spine on RT.    PT Treatment/Interventions  Therapeutic exercise;Patient/family education;Manual techniques;Dry needling    PT Next Visit Plan  Review HEP and add to exercises as needed , possible manual/heat    PT Home Exercise Plan  LT legf lift with IR Lt leg,  RT side lye with posterior slide left thigh and adduction squeeze    Consulted and Agree with Plan of Care  Patient       Patient will benefit from skilled therapeutic intervention in order to improve the following deficits and impairments:  Pain, Postural dysfunction, Increased muscle spasms  Visit Diagnosis: Chronic bilateral low back pain with right-sided sciatica  Cramp and spasm  Rib pain on right side     Problem List Patient Active Problem List   Diagnosis Date Noted  . Anxiety 10/04/2017  . Nonallopathic lesion of rib cage 06/15/2017  . Nonallopathic lesion of cervical region 05/18/2017  . Nonallopathic lesion of thoracic region 05/18/2017  . Nonallopathic lesion of lumbosacral region 05/18/2017  . Arrhythmia 10/08/2014  . TIA (transient ischemic attack) 08/09/2014  . Lateral meniscal tear 03/12/2014  . Testosterone deficiency 07/04/2013  . Cervicogenic headache 03/28/2013  . Vitamin D deficiency 01/01/2013  . Hyperlipidemia 01/01/2013  . BPH (benign prostatic hyperplasia) 11/03/2012  . Diverticulosis large intestine w/o perforation or abscess w/bleeding 11/03/2012  . Peripheral neuropathy 11/03/2012  . Hypothyroidism 01/18/2011  . Essential hypertension 01/18/2011  . GERD (gastroesophageal reflux disease) 01/18/2011  . Cervical spondylosis without myelopathy 11/24/2010  . Headache(784.0) 11/24/2010  . Sleep disturbance, unspecified  11/24/2010  . Syringomyelia and syringobulbia ([place]) 11/24/2010  . Disturbance of skin sensation 11/24/2010  . Cerebrovascular disease, unspecified 11/24/2010  Darrel Hoover  PT 02/07/2020, 9:15 AM  [facility] [address] [place], [place], [address] Phone: [phone]   Fax:  [phone]  Name: Jacob Kerr MRN: [id number] Date of Birth: 1951-08-20

## 2020-02-11 ENCOUNTER — Telehealth: Payer: Self-pay | Admitting: Physical Therapy

## 2020-02-11 ENCOUNTER — Ambulatory Visit: Payer: Medicare Other

## 2020-02-11 NOTE — Telephone Encounter (Signed)
Message left about missed appointment today and  Next appointment on 02/14/20 at 39 AM. Asked him to call if hew was not coming to that appointment and that was his last scheduled appointment.

## 2020-02-12 ENCOUNTER — Encounter
Payer: Medicare Other | Attending: Physical Medicine and Rehabilitation | Admitting: Physical Medicine and Rehabilitation

## 2020-02-12 DIAGNOSIS — M19021 Primary osteoarthritis, right elbow: Secondary | ICD-10-CM | POA: Diagnosis not present

## 2020-02-12 DIAGNOSIS — G588 Other specified mononeuropathies: Secondary | ICD-10-CM | POA: Insufficient documentation

## 2020-02-12 DIAGNOSIS — G950 Syringomyelia and syringobulbia: Secondary | ICD-10-CM | POA: Insufficient documentation

## 2020-02-12 DIAGNOSIS — M48062 Spinal stenosis, lumbar region with neurogenic claudication: Secondary | ICD-10-CM | POA: Insufficient documentation

## 2020-02-14 ENCOUNTER — Ambulatory Visit: Payer: Medicare Other

## 2020-02-14 ENCOUNTER — Other Ambulatory Visit: Payer: Self-pay

## 2020-02-14 DIAGNOSIS — R252 Cramp and spasm: Secondary | ICD-10-CM

## 2020-02-14 DIAGNOSIS — G8929 Other chronic pain: Secondary | ICD-10-CM

## 2020-02-14 DIAGNOSIS — R0781 Pleurodynia: Secondary | ICD-10-CM | POA: Diagnosis not present

## 2020-02-14 DIAGNOSIS — M6281 Muscle weakness (generalized): Secondary | ICD-10-CM | POA: Diagnosis not present

## 2020-02-14 DIAGNOSIS — M546 Pain in thoracic spine: Secondary | ICD-10-CM | POA: Diagnosis not present

## 2020-02-14 DIAGNOSIS — M5441 Lumbago with sciatica, right side: Secondary | ICD-10-CM | POA: Diagnosis not present

## 2020-02-14 NOTE — Therapy (Signed)
[place], [place], [address] Phone: [phone]   Fax:  [phone]  Physical Therapy Treatment  Patient Details  Name: Jacob Kerr MRN: [id number] Date of Birth: Nov 05, 1950 Referring Provider (PT): Leeroy Cha, MD   Encounter Date: 02/14/2020  PT End of Session - 02/14/20 0849    Visit Number  4    Number of Visits  10    Date for PT Re-Evaluation  02/22/20    Authorization Type  UHC MCR    PT Start Time  0845   pt late   PT Stop Time  0915    PT Time Calculation (min)  30 min    Activity Tolerance  Patient tolerated treatment well;Patient limited by pain    Behavior During Therapy  [facility] for tasks assessed/performed       Past Medical History:  Diagnosis Date  . Acute meniscal tear of left knee   . Allergic rhinitis   . Anal fissure   . Anxiety   . BPH (benign prostatic hypertrophy)   . Cervical spondylosis without myelopathy   . Cervicogenic headache   . Chronic headaches   . Chronic neck pain    uses TENS unit  prn  . Diverticulitis   . Gallstones   . GERD (gastroesophageal reflux disease)   . H/O hiatal hernia   . History of diverticulitis   . History of kidney stones   . History of TIA (transient ischemic attack)    04/ 2011---  no residuals  . Hypertension   . Hypothyroidism   . Peripheral neuropathy   . Shingles   . Syrinx of spinal cord ([place])    C4 -- C7    Past Surgical History:  Procedure Laterality Date  . CARDIAC CATHETERIZATION  02-18-2010   dr Angelena Form   normal coronary arteries/  normal lvsf--  ef 65%  . COLONOSCOPY    . [place]  . HERNIA REPAIR     [facility] [place] [facility]  . KNEE ARTHROSCOPY Left 03/13/2014   Procedure: LEFT ARTHROSCOPY KNEE WITH DEBRIDMENT;  Surgeon: Gearlean Alf, MD;  Location: [facility];  Service: Orthopedics;  Laterality: Left;  . LAPAROSCOPIC CHOLECYSTECTOMY  01-30-2003  . NEGATIVE SLEEP STUDY   2013   per pt  . RADIOFREQUENCY ABLATION NERVES  05/2016  . REMOVAL FORGEIN BODY FROM EAR  AGE 69  . RIGHT URETEROSCOPIC LASER LITHOTRIPSY STONE EXTRACTION /  STENT PLACEMENT  05-10-2000  . TRANSTHORACIC ECHOCARDIOGRAM  01-16-2010   normal lvf/  ef  60%/  mild lae    There were no vitals filed for this visit.  Subjective Assessment - 02/14/20 0847    Subjective  Working the other side gives my brain something to think about. RT elbow injection eased elbow pain.      [place] RT side better.    Pain Score  3     Pain Location  Rib cage    Pain Orientation  Right    Pain Descriptors / Indicators  Aching;Spasm;Sharp    Pain Type  Chronic pain    Pain Onset  More than a month ago    Pain Frequency  Constant    Aggravating Factors   activity    Pain Relieving Factors  rest, heat                        [facility] Adult PT Treatment/Exercise - 02/14/20 0001  Lumbar Exercises: Stretches   Double Knee to Chest Stretch  1 rep;60 seconds    Double Knee to Chest Stretch Limitations  allso with trunk tocking to give PA pressures to thoracic and upprer lumbar spine.     Other Lumbar Stretch Exercise  hold to pole sidebend RT  with RT hand over head  2/30 sec      Lumbar Exercises: Aerobic   Nustep  UE?LE L 4  17min      Lumbar Exercises: Standing   Other Standing Lumbar Exercises  Left leg lift from step with trunk Lt sidebend with pelvis elevation x 8 10 [place] with hip IR, cued too level shoulders      Lumbar Exercises: Supine   Other Supine Lumbar Exercises  PPT with  legs on  green ball  heels into ball and lifting of Lt leg  ,cued to maintain  PPT .  x 8 for   10 sec ,   than feet on 4 in platform PPT with Lt pelvic retraction  and red bandsRT clam x 6 10 sec, then    PPT  with Lt retraction Lt pelvis and IR  redband x 6 10 sec hold      Manual Therapy   Manual Traction  RT leg pull                  PT Long Term Goals - 02/14/20 0851      PT LONG TERM GOAL #1    Title  Independent with a HEP.    Status  On-going      PT LONG TERM GOAL #2   Title  He will report pain RT side easd 50% with normal activity requirung less time lying of comfort    Status  On-going            Plan - 02/14/20 0850    Clinical Impression Statement  Reports rib pain better  but not overall. He feels PT is helping. so will add 6 more visits.  Does the exercises wirth effort    PT Treatment/Interventions  Therapeutic exercise;Patient/family education;Manual techniques;Dry needling    PT Next Visit Plan  Continue with oincr reps  or hold time with exercises.  Assess leg length    PT Home Exercise Plan  LT legf lift with IR Lt leg,  RT side lye with posterior slide left thigh and adduction squeeze    Consulted and Agree with Plan of Care  Patient       Patient will benefit from skilled therapeutic intervention in order to improve the following deficits and impairments:  Pain, Postural dysfunction, Increased muscle spasms  Visit Diagnosis: Chronic bilateral low back pain with right-sided sciatica  Rib pain on right side  Cramp and spasm     Problem List Patient Active Problem List   Diagnosis Date Noted  . Anxiety 10/04/2017  . Nonallopathic lesion of rib cage 06/15/2017  . Nonallopathic lesion of cervical region 05/18/2017  . Nonallopathic lesion of thoracic region 05/18/2017  . Nonallopathic lesion of lumbosacral region 05/18/2017  . Arrhythmia 10/08/2014  . TIA (transient ischemic attack) 08/09/2014  . Lateral meniscal tear 03/12/2014  . Testosterone deficiency 07/04/2013  . Cervicogenic headache 03/28/2013  . Vitamin D deficiency 01/01/2013  . Hyperlipidemia 01/01/2013  . BPH (benign prostatic hyperplasia) 11/03/2012  . Diverticulosis large intestine w/o perforation or abscess w/bleeding 11/03/2012  . Peripheral neuropathy 11/03/2012  . Hypothyroidism 01/18/2011  . Essential hypertension 01/18/2011  . GERD (gastroesophageal  reflux disease)  01/18/2011  . Cervical spondylosis without myelopathy 11/24/2010  . Headache(784.0) 11/24/2010  . Sleep disturbance, unspecified 11/24/2010  . Syringomyelia and syringobulbia ([place]) 11/24/2010  . Disturbance of skin sensation 11/24/2010  . Cerebrovascular disease, unspecified 11/24/2010    Darrel Hoover  PT 02/14/2020, 9:18 AM  [facility] [address] [place], [place], [address] Phone: [phone]   Fax:  [phone]  Name: Jacob Kerr MRN: [id number] Date of Birth: Sep 24, 1951

## 2020-02-28 ENCOUNTER — Other Ambulatory Visit: Payer: Self-pay

## 2020-02-28 ENCOUNTER — Ambulatory Visit: Payer: Medicare Other | Attending: Physical Medicine and Rehabilitation | Admitting: Physical Therapy

## 2020-02-28 ENCOUNTER — Encounter: Payer: Self-pay | Admitting: Physical Therapy

## 2020-02-28 DIAGNOSIS — R252 Cramp and spasm: Secondary | ICD-10-CM | POA: Insufficient documentation

## 2020-02-28 DIAGNOSIS — M5441 Lumbago with sciatica, right side: Secondary | ICD-10-CM | POA: Diagnosis not present

## 2020-02-28 DIAGNOSIS — G8929 Other chronic pain: Secondary | ICD-10-CM | POA: Diagnosis not present

## 2020-02-28 DIAGNOSIS — M6281 Muscle weakness (generalized): Secondary | ICD-10-CM | POA: Insufficient documentation

## 2020-02-28 DIAGNOSIS — R0781 Pleurodynia: Secondary | ICD-10-CM | POA: Insufficient documentation

## 2020-02-28 DIAGNOSIS — M546 Pain in thoracic spine: Secondary | ICD-10-CM | POA: Diagnosis not present

## 2020-02-28 NOTE — Therapy (Signed)
[place], [place], [address] Phone: [phone]   Fax:  [phone]  Physical Therapy Treatment  Patient Details  Name: Jacob Kerr MRN: [id number] Date of Birth: 05-16-1951 Referring Provider (PT): Leeroy Cha, MD   Encounter Date: 02/28/2020  PT End of Session - 02/28/20 1151    Visit Number  5    Number of Visits  10    Date for PT Re-Evaluation  02/22/20    Authorization Type  UHC MCR    PT Start Time  [id number]    PT Stop Time  [id number]    PT Time Calculation (min)  58 min       Past Medical History:  Diagnosis Date  . Acute meniscal tear of left knee   . Allergic rhinitis   . Anal fissure   . Anxiety   . BPH (benign prostatic hypertrophy)   . Cervical spondylosis without myelopathy   . Cervicogenic headache   . Chronic headaches   . Chronic neck pain    uses TENS unit  prn  . Diverticulitis   . Gallstones   . GERD (gastroesophageal reflux disease)   . H/O hiatal hernia   . History of diverticulitis   . History of kidney stones   . History of TIA (transient ischemic attack)    04/ 2011---  no residuals  . Hypertension   . Hypothyroidism   . Peripheral neuropathy   . Shingles   . Syrinx of spinal cord ([place])    C4 -- C7    Past Surgical History:  Procedure Laterality Date  . CARDIAC CATHETERIZATION  02-18-2010   dr Angelena Form   normal coronary arteries/  normal lvsf--  ef 65%  . COLONOSCOPY    . [place]  . HERNIA REPAIR     [facility] [place] [facility]  . KNEE ARTHROSCOPY Left 03/13/2014   Procedure: LEFT ARTHROSCOPY KNEE WITH DEBRIDMENT;  Surgeon: Gearlean Alf, MD;  Location: [facility];  Service: Orthopedics;  Laterality: Left;  . LAPAROSCOPIC CHOLECYSTECTOMY  01-30-2003  . NEGATIVE SLEEP STUDY  2013   per pt  . RADIOFREQUENCY ABLATION NERVES  05/2016  . REMOVAL FORGEIN BODY FROM EAR  AGE 79  . RIGHT URETEROSCOPIC LASER LITHOTRIPSY STONE  EXTRACTION /  STENT PLACEMENT  05-10-2000  . TRANSTHORACIC ECHOCARDIOGRAM  01-16-2010   normal lvf/  ef  60%/  mild lae    There were no vitals filed for this visit.                     [place] Adult PT Treatment/Exercise - 02/28/20 0001      Lumbar Exercises: Stretches   Lower Trunk Rotation  10 seconds    Lower Trunk Rotation Limitations  --    Pelvic Tilt  10 reps    Standing Side Bend Limitations  seated side bending    Other Lumbar Stretch Exercise  seated trunk rotations with reach to opplosite lateral knee.       Lumbar Exercises: Aerobic   Nustep  L5 X 7  MINUTES       Lumbar Exercises: Seated   Other Seated Lumbar Exercises  pelvic rocking on sit-fit, then neutral with alternating knee extension- increased pain       Lumbar Exercises: Supine   Other Supine Lumbar Exercises  PPT with legs on PB and LLE lift , bridge with feet on ball x 10 , LTR with feet  on ball       Modalities   Modalities  Moist Heat      Moist Heat Therapy   Number Minutes Moist Heat  15 Minutes    Moist Heat Location  --   right thoracic                 PT Long Term Goals - 02/14/20 0851      PT LONG TERM GOAL #1   Title  Independent with a HEP.    Status  On-going      PT LONG TERM GOAL #2   Title  He will report pain RT side easd 50% with normal activity requirung less time lying of comfort    Status  On-going            Plan - 02/28/20 1240    Clinical Impression Statement  Pt reports he was better after trunk roations one session in PT however the pain has returned. After Nustep pt demonstrated staggered steps and nearly LOB x 2. Continued with trunk flexibility and stabilization per PT POC. Pt moves and adjusts during session. MHP applied at end of session to reduce spasms.    PT Next Visit Plan  Continue with incr reps  or hold time with exercises.  Assess leg length    PT Home Exercise Plan  LT legf lift with IR Lt leg,  RT side lye with posterior  slide left thigh and adduction squeeze       Patient will benefit from skilled therapeutic intervention in order to improve the following deficits and impairments:  Pain, Postural dysfunction, Increased muscle spasms  Visit Diagnosis: Chronic bilateral low back pain with right-sided sciatica     Problem List Patient Active Problem List   Diagnosis Date Noted  . Anxiety 10/04/2017  . Nonallopathic lesion of rib cage 06/15/2017  . Nonallopathic lesion of cervical region 05/18/2017  . Nonallopathic lesion of thoracic region 05/18/2017  . Nonallopathic lesion of lumbosacral region 05/18/2017  . Arrhythmia 10/08/2014  . TIA (transient ischemic attack) 08/09/2014  . Lateral meniscal tear 03/12/2014  . Testosterone deficiency 07/04/2013  . Cervicogenic headache 03/28/2013  . Vitamin D deficiency 01/01/2013  . Hyperlipidemia 01/01/2013  . BPH (benign prostatic hyperplasia) 11/03/2012  . Diverticulosis large intestine w/o perforation or abscess w/bleeding 11/03/2012  . Peripheral neuropathy 11/03/2012  . Hypothyroidism 01/18/2011  . Essential hypertension 01/18/2011  . GERD (gastroesophageal reflux disease) 01/18/2011  . Cervical spondylosis without myelopathy 11/24/2010  . Headache(784.0) 11/24/2010  . Sleep disturbance, unspecified 11/24/2010  . Syringomyelia and syringobulbia ([place]) 11/24/2010  . Disturbance of skin sensation 11/24/2010  . Cerebrovascular disease, unspecified 11/24/2010    Dorene Ar, PTA 02/28/2020, 12:59 PM  [facility] [address] [place], [place], [address] Phone: [phone]   Fax:  [phone]  Name: TAREN WOODLEE MRN: [id number] Date of Birth: [birth year]

## 2020-03-03 ENCOUNTER — Ambulatory Visit: Payer: Medicare Other

## 2020-03-03 ENCOUNTER — Other Ambulatory Visit: Payer: Self-pay

## 2020-03-03 DIAGNOSIS — R252 Cramp and spasm: Secondary | ICD-10-CM | POA: Diagnosis not present

## 2020-03-03 DIAGNOSIS — M546 Pain in thoracic spine: Secondary | ICD-10-CM

## 2020-03-03 DIAGNOSIS — M6281 Muscle weakness (generalized): Secondary | ICD-10-CM

## 2020-03-03 DIAGNOSIS — R0781 Pleurodynia: Secondary | ICD-10-CM

## 2020-03-03 DIAGNOSIS — M5441 Lumbago with sciatica, right side: Secondary | ICD-10-CM | POA: Diagnosis not present

## 2020-03-03 DIAGNOSIS — G8929 Other chronic pain: Secondary | ICD-10-CM | POA: Diagnosis not present

## 2020-03-03 NOTE — Therapy (Signed)
[facility], [place], [address] Phone: [phone]   Fax:  [phone]  Physical Therapy Treatment/Discharge  Patient Details  Name: Jacob Kerr MRN: [id number] Date of Birth: April 28, 1951 Referring Provider (PT): Leeroy Cha, MD   Encounter Date: 03/03/2020  PT End of Session - 03/03/20 0828    Visit Number  6    Number of Visits  10    Date for PT Re-Evaluation  02/22/20    Authorization Type  [employer] MCR    PT Start Time  0830    PT Stop Time  0910    PT Time Calculation (min)  40 min    Activity Tolerance  Patient tolerated treatment well;Patient limited by pain    Behavior During Therapy  [facility] for tasks assessed/performed       Past Medical History:  Diagnosis Date   Acute meniscal tear of left knee    Allergic rhinitis    Anal fissure    Anxiety    BPH (benign prostatic hypertrophy)    Cervical spondylosis without myelopathy    Cervicogenic headache    Chronic headaches    Chronic neck pain    uses TENS unit  prn   Diverticulitis    Gallstones    GERD (gastroesophageal reflux disease)    H/O hiatal hernia    History of diverticulitis    History of kidney stones    History of TIA (transient ischemic attack)    04/ 2011---  no residuals   Hypertension    Hypothyroidism    Peripheral neuropathy    Shingles    Syrinx of spinal cord ([place])    C4 -- C7    Past Surgical History:  Procedure Laterality Date   CARDIAC CATHETERIZATION  02-18-2010   dr Angelena Form   normal coronary arteries/  normal lvsf--  ef 65%   COLONOSCOPY     [place]     [facility] [place] [facility]   KNEE ARTHROSCOPY Left 03/13/2014   Procedure: LEFT ARTHROSCOPY KNEE WITH DEBRIDMENT;  Surgeon: Gearlean Alf, MD;  Location: [facility];  Service: Orthopedics;  Laterality: Left;   LAPAROSCOPIC CHOLECYSTECTOMY  01-30-2003   NEGATIVE SLEEP STUDY   2013   per pt   RADIOFREQUENCY ABLATION NERVES  05/2016   REMOVAL FORGEIN BODY FROM EAR  AGE 32   RIGHT URETEROSCOPIC LASER LITHOTRIPSY STONE EXTRACTION /  STENT PLACEMENT  05-10-2000   TRANSTHORACIC ECHOCARDIOGRAM  01-16-2010   normal lvf/  ef  60%/  mild lae    There were no vitals filed for this visit.  Subjective Assessment - 03/03/20 0854    Subjective  It's the same , comes and goes.    Pain Score  7     Pain Location  Thoracic   ribs   Pain Orientation  Right    Pain Descriptors / Indicators  Aching;Spasm;Sharp    Pain Type  Chronic pain    Pain Onset  More than a month ago    Pain Frequency  Constant    Aggravating Factors   activity ,    Pain Relieving Factors  heat, rest stretcxh                        [facility] Adult PT Treatment/Exercise - 03/03/20 0001      Lumbar Exercises: Stretches   Lower Trunk Rotation  --    Lower Trunk  Rotation Limitations  10 reps 5 sec    Pelvic Tilt  10 reps    Standing Side Bend Limitations  seated side bending    Other Lumbar Stretch Exercise  seated trunk rotations with reach to opplosite lateral knee.       Lumbar Exercises: Aerobic   Nustep  L5 X 7  MINUTES  UE/LE        Lumbar Exercises: Seated   Other Seated Lumbar Exercises  pelvic rocking on sit-fit  lateral and AP, then neutral with alternating knee extension- increased pain       Lumbar Exercises: Supine   Bridge  10 reps    Other Supine Lumbar Exercises  PPT with legs on PB and LLE lift , bridge with feet on ball x 10 , LTR with feet on ball       Lumbar Exercises: Sidelying   Other Sidelying Lumbar Exercises  thoracic rotaiton RT /LT x 8       Manual Therapy   Manual Traction  RT leg pull                  PT Long Term Goals - 03/03/20 1036      PT LONG TERM GOAL #1   Title  Independent with a HEP.    Status  Achieved      PT LONG TERM GOAL #2   Title  He will report pain RT side easd 50% with normal activity requirung less time  lying of comfort    Baseline  no bbetter in past month    Status  Not Met      PT LONG TERM GOAL #5   Title  able to play with his 62 year old grandson with 50% less  pain due to improve mobilty and decreased in muscle spasms    Baseline  no change    Status  Not Met            Plan - 03/03/20 0829    Clinical Impression Statement  [name] reports hew is doing all HEP and pushes himself at home but feels coming to PT is not  improvinhg his pain. He will see how it goes for 2-3 months and decide on possible return then.    PT Treatment/Interventions  Therapeutic exercise;Patient/family education;Manual techniques;Dry needling    PT Next Visit Plan  Discharge with HEP    PT Home Exercise Plan  LT legf lift with IR Lt leg,  RT side lye with posterior slide left thigh and adduction squeeze    Consulted and Agree with Plan of Care  Patient       Patient will benefit from skilled therapeutic intervention in order to improve the following deficits and impairments:  Pain, Postural dysfunction, Increased muscle spasms  Visit Diagnosis: Rib pain on right side  Cramp and spasm  Muscle weakness (generalized)  Pain in thoracic spine     Problem List Patient Active Problem List   Diagnosis Date Noted   Anxiety 10/04/2017   Nonallopathic lesion of rib cage 06/15/2017   Nonallopathic lesion of cervical region 05/18/2017   Nonallopathic lesion of thoracic region 05/18/2017   Nonallopathic lesion of lumbosacral region 05/18/2017   Arrhythmia 10/08/2014   TIA (transient ischemic attack) 08/09/2014   Lateral meniscal tear 03/12/2014   Testosterone deficiency 07/04/2013   Cervicogenic headache 03/28/2013   Vitamin D deficiency 01/01/2013   Hyperlipidemia 01/01/2013   BPH (benign prostatic hyperplasia) 11/03/2012   Diverticulosis large intestine w/o  perforation or abscess w/bleeding 11/03/2012   Peripheral neuropathy 11/03/2012   Hypothyroidism 01/18/2011    Essential hypertension 01/18/2011   GERD (gastroesophageal reflux disease) 01/18/2011   Cervical spondylosis without myelopathy 11/24/2010   Headache(784.0) 11/24/2010   Sleep disturbance, unspecified 11/24/2010   Syringomyelia and syringobulbia ([place]) 11/24/2010   Disturbance of skin sensation 11/24/2010   Cerebrovascular disease, unspecified 11/24/2010    Darrel Hoover  PT 03/03/2020, 10:37 AM  [facility] [address] [place], [place], [address] Phone: [phone]   Fax:  [phone]  Name: CARL BUTNER MRN: [id number] Date of Birth: October 01, 1950  PHYSICAL THERAPY DISCHARGE SUMMARY  Visits from Start of Care: 6  Current functional level related to goals / functional outcomes: Not met.    Remaining deficits: No changes since eval   Education / Equipment: He is independent with HEP Plan: Patient agrees to discharge.  Patient goals were partially met. Patient is being discharged due to lack of progress.  ?????

## 2020-03-04 ENCOUNTER — Other Ambulatory Visit: Payer: Self-pay | Admitting: Physical Medicine and Rehabilitation

## 2020-03-04 DIAGNOSIS — R1031 Right lower quadrant pain: Secondary | ICD-10-CM

## 2020-03-05 ENCOUNTER — Ambulatory Visit: Payer: Medicare Other | Admitting: Physical Therapy

## 2020-03-10 ENCOUNTER — Encounter: Payer: Medicare Other | Admitting: Physical Therapy

## 2020-03-12 ENCOUNTER — Encounter: Payer: Medicare Other | Admitting: Physical Therapy

## 2020-03-14 DIAGNOSIS — G5621 Lesion of ulnar nerve, right upper limb: Secondary | ICD-10-CM | POA: Diagnosis not present

## 2020-03-14 DIAGNOSIS — G5601 Carpal tunnel syndrome, right upper limb: Secondary | ICD-10-CM | POA: Diagnosis not present

## 2020-03-14 DIAGNOSIS — M25521 Pain in right elbow: Secondary | ICD-10-CM | POA: Diagnosis not present

## 2020-03-19 ENCOUNTER — Encounter: Payer: Medicare Other | Admitting: Physical Therapy

## 2020-04-02 NOTE — Progress Notes (Signed)
HEMATOLOGY/ONCOLOGY CLINIC NOTE  Date of Service: 04/03/2020  Patient Care Team: Janora Norlander, DO as PCP - General (Family Medicine) Luna Kitchens., MD (Neurosurgery) Sable Feil, MD (Gastroenterology) Kathrynn Ducking, MD (Neurology) Edrick Oh, MD (Nephrology) Minus Breeding, MD as Consulting Physician (Cardiology) Janora Norlander, DO as Consulting Physician (Family Medicine)  REFERRING PHYSICIAN: Janora Norlander, DO  CHIEF COMPLAINTS/PURPOSE OF CONSULTATION:  Bone Pain polycythemia   HISTORY OF PRESENTING ILLNESS:   Jacob Kerr is a wonderful 69 y.o. male who has been referred to [place] by Janora Norlander, DO for evaluation and management of Bone Pain. The pt reports that he is doing well overall.   The pt reports that he has right should blade pain and right rib pain. He also has random bone pain in his hips.  June 2015 he fell going down hill and it effected his right side.  He has had several imaging studies since his fall that has not revealed a cause to his pain.  He has noticed that he has some trouble controlling his bladder. He also experiences bowel issues such diarrhea and constipation. He has tried pelvic floor therapy.   He has seen a neurologist and had a nerve ablation with no improvement.  He has had some cutaneous squamous cell carcinoma removed.  He has scoliosis in his spine.  He has been to a pain clinic but does not want to take the pain medications due to the side effects.    He states that he is drinking a fair amount of ETOH to sleep. He did not start drinking since 2015.  He experiences some pain his big toe that feels as if someone is drilling a nail through his toe.  He has tried never medications that did not help.  He has noticed that there as been a 5-10 lb radiance in his weight.  He has tried yoga and lays on a bed of nails every night.  He has also tried dry needling treatments.  He is using  a TENS that helps temporarily.  He has never had a bone density test and he is not taking Vitamin D.  No localized back pain.  He feels something growing under his right shoulder blade.   Most recent lab results (03/28/2019) of CBC is as follows: all values are WNL except for MCV at 100, MCH at 34.3, Glucose at 113, BUN/Creatinine Ratio at 9.  On review of systems, pt reports pain over the right should blade and pain in the right ribs, uncontrollable bowels, and denies abdominal pain and any other symptoms.   On Social Hx the pt reports never smoker. He began drinking 2015. Currently drinking a half of a gallon of crown royal whisky.  On Family Hx the pt reports that his sister had a cancerous tumor behind her left eye. She passed from this cancer at 42. He has another sister who passed away from carcinoma.   INTERVAL HISTORY:   Jacob Kerr is a wonderful 69 y.o. male who is here for evaluation and management of Bone Pain. The patient's last visit with [place] was on 11/22/19. The pt reports that he is doing well overall.  The pt reports he is good. His bone pain has been about the same but he stops when he feels pain. He is no longer drinking much alcohol. Pt has been eating well and sleeping fine. His bone pain has been hurting in his hip and right  arm has been painful. The groin pain comes and goes.   Lab results today (04/03/20) of CBC w/diff and CMP is as follows: all values are WNL except for Hemoglobin at 17.8, Glucose at 125, Creatinine at 1.27, total Bilirubin at 1.7, GFR, Est Non Af Am at 58   On review of systems, pt reports healthy appetite, staying active, bone pain and denies pain along back and any other symptoms.    MEDICAL HISTORY:  Past Medical History:  Diagnosis Date   Acute meniscal tear of left knee    Allergic rhinitis    Anal fissure    Anxiety    BPH (benign prostatic hypertrophy)    Cervical spondylosis without myelopathy    Cervicogenic headache      Chronic headaches    Chronic neck pain    uses TENS unit  prn   Diverticulitis    Gallstones    GERD (gastroesophageal reflux disease)    H/O hiatal hernia    History of diverticulitis    History of kidney stones    History of TIA (transient ischemic attack)    04/ 2011---  no residuals   Hypertension    Hypothyroidism    Peripheral neuropathy    Shingles    Syrinx of spinal cord ([place])    C4 -- C7     SURGICAL HISTORY: Past Surgical History:  Procedure Laterality Date   CARDIAC CATHETERIZATION  02-18-2010   dr Angelena Form   normal coronary arteries/  normal lvsf--  ef 65%   COLONOSCOPY     [place]   HERNIA REPAIR     central [place] surgery   KNEE ARTHROSCOPY Left 03/13/2014   Procedure: LEFT ARTHROSCOPY KNEE WITH DEBRIDMENT;  Surgeon: Gearlean Alf, MD;  Location: [facility];  Service: Orthopedics;  Laterality: Left;   LAPAROSCOPIC CHOLECYSTECTOMY  01-30-2003   NEGATIVE SLEEP STUDY  2013   per pt   RADIOFREQUENCY ABLATION NERVES  05/2016   REMOVAL FORGEIN BODY FROM EAR  AGE 87   RIGHT URETEROSCOPIC LASER LITHOTRIPSY STONE EXTRACTION /  STENT PLACEMENT  05-10-2000   TRANSTHORACIC ECHOCARDIOGRAM  01-16-2010   normal lvf/  ef  60%/  mild lae     SOCIAL HISTORY: Social History   Socioeconomic History   Marital status: Married    Spouse name: [name]   Number of children: 2   Years of education: 12   Highest education level: 12th grade  Occupational History   Occupation: Retired    Fish farm manager: Chief of Staff  Tobacco Use   Smoking status: Never Smoker   Smokeless tobacco: Never Used  Scientific laboratory technician Use: Never used  Substance and Sexual Activity   Alcohol use: Not Currently    Alcohol/week: 7.0 standard drinks    Types: 7 Shots of liquor per week    Comment: 2 at night   Drug use: No   Sexual activity: Not on file  Other Topics Concern   Not on file  Social History Narrative   Lives  at home with wife.       Patient is right handed.   Patient drinks 1-2 cups of caffeine daily.   Social Determinants of Health   Financial Resource Strain:    Difficulty of Paying Living Expenses:   Food Insecurity:    Worried About Charity fundraiser in the Last Year:    Arboriculturist in the Last Year:   Transportation Needs:  Lack of Transportation (Medical):    Lack of Transportation (Non-Medical):   Physical Activity:    Days of Exercise per Week:    Minutes of Exercise per Session:   Stress:    Feeling of Stress :   Social Connections:    Frequency of Communication with Friends and Family:    Frequency of Social Gatherings with Friends and Family:    Attends Religious Services:    Active Member of Clubs or Organizations:    Attends Music therapist:    Marital Status:   Intimate Partner Violence:    Fear of Current or Ex-Partner:    Emotionally Abused:    Physically Abused:    Sexually Abused:      FAMILY HISTORY: Family History  Problem Relation Age of Onset   Prostate cancer Father    Heart disease Father    Stroke Father    Nephrolithiasis Sister    Cancer Sister        Intestinal   Hyperlipidemia Sister    Hyperlipidemia Sister    Cancer Sister        Sinus   COPD Other    Colon polyps Brother        x2   Colon polyps Sister    Colon cancer Neg Hx    Esophageal cancer Neg Hx      ALLERGIES:   is allergic to cymbalta [duloxetine hcl], lyrica [pregabalin], neurontin [gabapentin], robaxin [methocarbamol], sudafed [pseudoephedrine hcl], and epinephrine.   MEDICATIONS:  Current Outpatient Medications  Medication Sig Dispense Refill   amitriptyline (ELAVIL) 10 MG tablet TAKE 1 TABLET BY MOUTH EVERYDAY AT BEDTIME 90 tablet 2   amLODipine (NORVASC) 10 MG tablet TAKE 1 TABLET BY MOUTH  DAILY 90 tablet 1   aspirin 81 MG tablet Take 81 mg by mouth at bedtime.      azelastine (ASTELIN) 0.1 % nasal  spray PLACE 2 SPRAYS INTO BOTH NOSTRILS 2 (TWO) TIMES DAILY. 30 mL 5   baclofen (LIORESAL) 10 MG tablet Take 1 tablet (10 mg total) by mouth 3 (three) times daily. 270 each 1   Coenzyme Q10 (CO Q 10) 100 MG CAPS Take 1 capsule by mouth daily.     ergocalciferol (VITAMIN D2) 1.25 MG (50000 UT) capsule Take 1 capsule (50,000 Units total) by mouth 2 (two) times a week. 24 capsule 3   levothyroxine (SYNTHROID) 150 MCG tablet TAKE 1 TABLET BY MOUTH  DAILY BEFORE BREAKFAST  NEEDS TO BE SEEN FOR  FURTHER REFILLS 90 tablet 1   LORazepam (ATIVAN) 0.5 MG tablet Take 1 tablet (0.5 mg total) by mouth 2 (two) times daily as needed for anxiety. (Patient taking differently: Take 0.5 mg by mouth as needed for anxiety. ) 30 tablet 0   losartan (COZAAR) 50 MG tablet TAKE 1 TABLET BY MOUTH  DAILY 90 tablet 1   NON FORMULARY Take 3 capsules by mouth every morning. Nugenix Testosterone Booster / [employer]      omeprazole (PRILOSEC) 20 MG capsule TAKE 1 CAPSULE BY MOUTH TWO TIMES DAILY 180 capsule 1   UNABLE TO FIND Med Name: CBD oil     venlafaxine XR (EFFEXOR-XR) 75 MG 24 hr capsule TAKE 1 CAPSULE BY MOUTH  DAILY WITH BREAKFAST 90 capsule 1   No current facility-administered medications for this visit.     REVIEW OF SYSTEMS:   A 10+ POINT REVIEW OF SYSTEMS WAS OBTAINED including neurology, dermatology, psychiatry, cardiac, respiratory, lymph, extremities, GI, GU, Musculoskeletal, constitutional, breasts, reproductive,  HEENT.  All pertinent positives are noted in the HPI.  All others are negative.   PHYSICAL EXAMINATION: ECOG PERFORMANCE STATUS: 2 - Symptomatic, <50% confined to bed  Vitals:   04/03/20 0915  BP: (!) 155/91  Pulse: 69  Resp: 18  Temp: 97.9 F (36.6 C)  SpO2: 99%   Filed Weights   04/03/20 0915  Weight: 189 lb 11.2 oz (86 kg)   Body mass index is 25.73 kg/m.   Exam given in chair  GENERAL:alert, in no acute distress and comfortable SKIN: no acute rashes, no significant  lesions EYES: conjunctiva are pink and non-injected, sclera anicteric OROPHARYNX: MMM, no exudates, no oropharyngeal erythema or ulceration NECK: supple, no JVD LYMPH:  no palpable lymphadenopathy in the cervical, axillary or inguinal regions LUNGS: clear to auscultation b/l with normal respiratory effort HEART: regular rate & rhythm ABDOMEN:  normoactive bowel sounds , non tender, not distended. Extremity: no pedal edema PSYCH: alert & oriented x 3 with fluent speech NEURO: no focal motor/sensory deficits  LABORATORY DATA:  I have reviewed the data as listed  CBC Latest Ref Rng & Units 04/03/2020 11/08/2019 03/28/2019  WBC 4.0 - 10.5 K/uL 5.6 6.1 6.1  Hemoglobin 13.0 - 17.0 g/dL 17.8(H) 19.1(H) 17.2  Hematocrit 39 - 52 % 51.9 53.9(H) 50.2  Platelets 150 - 400 K/uL 208 225 227   11/16/2019 JAK2:   RADIOGRAPHIC STUDIES: I have personally reviewed the radiological images as listed and agreed with the findings in the report. No results found.   ASSESSMENT & PLAN:   Jacob Kerr is a 69 y.o. male with:  1. Persistent generalized bone pains and back pains - ? Related to scoliosis. 2. Chest wall pain ? From trauma vs other etiology 3. 3. Polycythemia -  R/o PCV  PLAN: -Discussed pt labwork today, 04/03/20; of CBC w/diff and CMP is as follows: all values are WNL except for Hemoglobin at 17.8, Glucose at 125, Creatinine at 1.27, total Bilirubin at 1.7, GFR, Est Non Af Am at 58 -Pt does not have PCV based on mutation testing. Polycythemia most likely due to dehydration and alcohol use  -Lack of M Protein on MMP and no bone lesions identified on Bone Scan make Mulitple Myeloma unlikely  -Advised on staying active and possibly getting referral to PT from PCP -Advised bone pain due to degenerative disk disease and scoliosis  -Recommends continue cutting down alcohol use  -Recommends continue to stay hydrated  -Recommends continue Rx 2x weekly Ergocalciferol 50k units -Recommends PCP  recheck Vitamin D levels on f/u  -Recommends f/u with PCP about memory difficulties  -F/u with PCP about pain management and sleep study -Will see back as needed   No orders of the defined types were placed in this encounter.  FOLLOW UP RTC with Dr Irene Limbo as needed  The total time spent in the appt was 20 minutes and more than 50% was on counseling and direct patient cares.  All of the patient's questions were answered with apparent satisfaction. The patient knows to call the clinic with any problems, questions or concerns.  Sullivan Lone MD MS AAHIVMS [facility] Hematology/Oncology Physician  [facility]   (Office):       [phone] (Work cell):  [phone] (Fax):           [phone]  04/03/2020 10:06 AM  I, Dawayne Cirri am acting as a Education administrator for Dr. Sullivan Lone.   .I have reviewed the above documentation for accuracy and completeness,  and I agree with the above. Brunetta Genera MD

## 2020-04-03 ENCOUNTER — Other Ambulatory Visit: Payer: Self-pay

## 2020-04-03 ENCOUNTER — Inpatient Hospital Stay: Payer: Medicare Other

## 2020-04-03 ENCOUNTER — Inpatient Hospital Stay: Payer: Medicare Other | Attending: Hematology | Admitting: Hematology

## 2020-04-03 VITALS — BP 155/91 | HR 69 | Temp 97.9°F | Resp 18 | Ht 72.0 in | Wt 189.7 lb

## 2020-04-03 DIAGNOSIS — M898X9 Other specified disorders of bone, unspecified site: Secondary | ICD-10-CM | POA: Diagnosis not present

## 2020-04-03 DIAGNOSIS — Z79899 Other long term (current) drug therapy: Secondary | ICD-10-CM | POA: Diagnosis not present

## 2020-04-03 DIAGNOSIS — Z8042 Family history of malignant neoplasm of prostate: Secondary | ICD-10-CM | POA: Diagnosis not present

## 2020-04-03 DIAGNOSIS — E039 Hypothyroidism, unspecified: Secondary | ICD-10-CM | POA: Diagnosis not present

## 2020-04-03 DIAGNOSIS — D751 Secondary polycythemia: Secondary | ICD-10-CM

## 2020-04-03 DIAGNOSIS — Z8249 Family history of ischemic heart disease and other diseases of the circulatory system: Secondary | ICD-10-CM | POA: Insufficient documentation

## 2020-04-03 DIAGNOSIS — F419 Anxiety disorder, unspecified: Secondary | ICD-10-CM | POA: Insufficient documentation

## 2020-04-03 DIAGNOSIS — I10 Essential (primary) hypertension: Secondary | ICD-10-CM | POA: Insufficient documentation

## 2020-04-03 LAB — CMP (CANCER CENTER ONLY)
ALT: 27 U/L (ref 0–44)
AST: 19 U/L (ref 15–41)
Albumin: 4.1 g/dL (ref 3.5–5.0)
Alkaline Phosphatase: 115 U/L (ref 38–126)
Anion gap: 9 (ref 5–15)
BUN: 13 mg/dL (ref 8–23)
CO2: 28 mmol/L (ref 22–32)
Calcium: 9.2 mg/dL (ref 8.9–10.3)
Chloride: 103 mmol/L (ref 98–111)
Creatinine: 1.27 mg/dL — ABNORMAL HIGH (ref 0.61–1.24)
GFR, Est AFR Am: >60 mL/min (ref >60)
GFR, Estimated: 58 mL/min — ABNORMAL LOW (ref >60)
Glucose, Bld: 125 mg/dL — ABNORMAL HIGH (ref 70–99)
Potassium: 3.8 mmol/L (ref 3.5–5.1)
Sodium: 140 mmol/L (ref 135–145)
Total Bilirubin: 1.7 mg/dL — ABNORMAL HIGH (ref 0.3–1.2)
Total Protein: 7.2 g/dL (ref 6.5–8.1)

## 2020-04-03 LAB — CBC WITH DIFFERENTIAL/PLATELET
Abs Immature Granulocytes: 0.01 10*3/uL (ref 0.00–0.07)
Basophils Absolute: 0.1 10*3/uL (ref 0.0–0.1)
Basophils Relative: 1 %
Eosinophils Absolute: 0.2 10*3/uL (ref 0.0–0.5)
Eosinophils Relative: 3 %
HCT: 51.9 % (ref 39.0–52.0)
Hemoglobin: 17.8 g/dL — ABNORMAL HIGH (ref 13.0–17.0)
Immature Granulocytes: 0 %
Lymphocytes Relative: 27 %
Lymphs Abs: 1.5 10*3/uL (ref 0.7–4.0)
MCH: 32.4 pg (ref 26.0–34.0)
MCHC: 34.3 g/dL (ref 30.0–36.0)
MCV: 94.4 fL (ref 80.0–100.0)
Monocytes Absolute: 0.6 10*3/uL (ref 0.1–1.0)
Monocytes Relative: 11 %
Neutro Abs: 3.3 10*3/uL (ref 1.7–7.7)
Neutrophils Relative %: 58 %
Platelets: 208 10*3/uL (ref 150–400)
RBC: 5.5 MIL/uL (ref 4.22–5.81)
RDW: 12.5 % (ref 11.5–15.5)
WBC: 5.6 10*3/uL (ref 4.0–10.5)
nRBC: 0 % (ref 0.0–0.2)

## 2020-04-03 NOTE — Patient Instructions (Signed)
Thank you for choosing [facility] to provide your oncology and hematology care.   Should you have questions after your visit to the [facility] ([facility]), please contact this office at [phone] between 8:30 AM and 4:30 PM.  Voice mails left after 4:00 PM may not be returned until the following business day.  Calls received after 4:30 PM will be answered by an off-site Nurse Triage Line.    Prescription Refills:  Please have your pharmacy contact us directly for most prescription requests.  Contact the office directly for refills of narcotics (pain medications). Allow 48-72 hours for refills.  Appointments: Please contact the [facility] scheduling department [phone] for questions regarding [facility] appointment scheduling.  Contact the schedulers with any scheduling changes so that your appointment can be rescheduled in a timely manner.   Central Scheduling for [facility] [phone] - Call to schedule procedures such as PET scans, CT scans, MRI, Ultrasound, etc.  To afford each patient quality time with our providers, please arrive 30 minutes before your scheduled appointment time.  If you arrive late for your appointment, you may be asked to reschedule.  We strive to give you quality time with our providers, and arriving late affects you and other patients whose appointments are after yours. If you are a no show for multiple scheduled visits, you may be dismissed from the clinic at the providers discretion.     Resources: [facility] Social Workers [phone] for additional information on assistance programs or assistance connecting with community support programs   [place] DSS  336-641-3447: Information regarding food stamps, Medicaid, and utility assistance [place] 336-333-6589   [place] [employer]'s shared-ride transportation service for eligible riders who have a disability that prevents them from riding the fixed route bus.   Medicare Rights Center  800-333-4114 Helps people with Medicare understand their rights and benefits, navigate the Medicare system, and secure the quality healthcare they deserve [employer] 800-227-2345 Assists patients locate various types of support and financial assistance Cancer Care: 1-800-813-HOPE (4673) Provides financial assistance, online support groups, medication/co-pay assistance.   Transportation Assistance for appointments at [facility]: Transportation Coordinator [phone]  Again, thank you for choosing [facility] for your care.       

## 2020-04-06 ENCOUNTER — Encounter: Payer: Self-pay | Admitting: Family Medicine

## 2020-04-10 ENCOUNTER — Ambulatory Visit: Payer: Medicare Other | Admitting: Physical Therapy

## 2020-04-11 ENCOUNTER — Other Ambulatory Visit: Payer: Self-pay | Admitting: Family Medicine

## 2020-04-14 DIAGNOSIS — M25521 Pain in right elbow: Secondary | ICD-10-CM | POA: Diagnosis not present

## 2020-04-14 DIAGNOSIS — G5601 Carpal tunnel syndrome, right upper limb: Secondary | ICD-10-CM | POA: Diagnosis not present

## 2020-04-14 DIAGNOSIS — G5621 Lesion of ulnar nerve, right upper limb: Secondary | ICD-10-CM | POA: Diagnosis not present

## 2020-04-14 NOTE — Telephone Encounter (Signed)
Pt is aware he ntbs for refills and he will call back to schedule his appt because he was driving and didn't have the calendar to see when he was free. Advised pt he has to call back to schedule or we can't refill his medications.

## 2020-04-14 NOTE — Telephone Encounter (Signed)
[place]. NTBS LOV 03/28/19 other was acute in Sept. Mail order not sent

## 2020-04-25 ENCOUNTER — Other Ambulatory Visit: Payer: Self-pay | Admitting: Family Medicine

## 2020-04-25 DIAGNOSIS — E034 Atrophy of thyroid (acquired): Secondary | ICD-10-CM

## 2020-04-27 ENCOUNTER — Other Ambulatory Visit: Payer: Self-pay | Admitting: Family Medicine

## 2020-04-28 ENCOUNTER — Encounter: Payer: Self-pay | Admitting: Family Medicine

## 2020-04-28 ENCOUNTER — Other Ambulatory Visit: Payer: Self-pay | Admitting: *Deleted

## 2020-04-28 MED ORDER — VENLAFAXINE HCL ER 75 MG PO CP24: ORAL_CAPSULE | ORAL | 1 refills | Status: DC

## 2020-05-21 ENCOUNTER — Other Ambulatory Visit: Payer: Self-pay

## 2020-05-21 ENCOUNTER — Encounter: Payer: Self-pay | Admitting: Family Medicine

## 2020-05-21 ENCOUNTER — Ambulatory Visit (INDEPENDENT_AMBULATORY_CARE_PROVIDER_SITE_OTHER): Payer: Medicare Other | Admitting: Family Medicine

## 2020-05-21 VITALS — BP 121/74 | HR 67 | Temp 97.0°F | Ht 72.0 in | Wt 190.4 lb

## 2020-05-21 DIAGNOSIS — I10 Essential (primary) hypertension: Secondary | ICD-10-CM | POA: Diagnosis not present

## 2020-05-21 DIAGNOSIS — N1831 Chronic kidney disease, stage 3a: Secondary | ICD-10-CM | POA: Insufficient documentation

## 2020-05-21 DIAGNOSIS — K219 Gastro-esophageal reflux disease without esophagitis: Secondary | ICD-10-CM | POA: Diagnosis not present

## 2020-05-21 DIAGNOSIS — E034 Atrophy of thyroid (acquired): Secondary | ICD-10-CM

## 2020-05-21 MED ORDER — OMEPRAZOLE 20 MG PO CPDR: DELAYED_RELEASE_CAPSULE | ORAL | 1 refills | Status: DC

## 2020-05-21 MED ORDER — AMLODIPINE BESYLATE 10 MG PO TABS: 10.0000 mg | ORAL_TABLET | Freq: Every day | ORAL | 3 refills | Status: DC

## 2020-05-21 MED ORDER — LOSARTAN POTASSIUM 50 MG PO TABS: 50.0000 mg | ORAL_TABLET | Freq: Every day | ORAL | 3 refills | Status: DC

## 2020-05-21 NOTE — Progress Notes (Signed)
Subjective: CC: Checkup PCP: Jacob Norlander, DO Jacob Kerr is a 69 y.o. male presenting to clinic today for:  1.  Hypertension, CKD 3 Patient reports compliance with his medications.  He does need refills.  No chest pain, shortness of breath, lower extremity edema.  He is trying to stay active as this helps him keep his mind off of other things.  He does report chronic pain.  He is seeing a specialist for this.  He has an appoint with the urologist tomorrow as well  2.  Hypothyroidism Patient reports compliance with Synthroid 150 mcg daily.  No change in was, difficulty swallowing, change in bowel habit or body weight  3.  GERD Patient reports compliance with PPI.  No known bleeding.  No nausea or vomiting.   ROS: Per HPI  Allergies  Allergen Reactions   Cymbalta [Duloxetine Hcl] Other (See Comments)    Depression   Lyrica [Pregabalin] Other (See Comments)    Paranoid   Neurontin [Gabapentin] Other (See Comments)    nightmares   Robaxin [Methocarbamol] Other (See Comments)    Seeing "bright lights"   Sudafed [Pseudoephedrine Hcl] Other (See Comments)    Increased heart rate   Epinephrine Palpitations    Decreased blood pressure   Past Medical History:  Diagnosis Date   Acute meniscal tear of left knee    Allergic rhinitis    Anal fissure    Anxiety    BPH (benign prostatic hypertrophy)    Cervical spondylosis without myelopathy    Cervicogenic headache    Chronic headaches    Chronic neck pain    uses TENS unit  prn   Diverticulitis    Gallstones    GERD (gastroesophageal reflux disease)    H/O hiatal hernia    History of diverticulitis    History of kidney stones    History of TIA (transient ischemic attack)    04/ 2011---  no residuals   Hypertension    Hypothyroidism    Peripheral neuropathy    Shingles    Syrinx of spinal cord (HCC)    C4 -- C7    Current Outpatient Medications:    amitriptyline (ELAVIL) 10  MG tablet, TAKE 1 TABLET BY MOUTH EVERYDAY AT BEDTIME, Disp: 90 tablet, Rfl: 2   amLODipine (NORVASC) 10 MG tablet, TAKE 1 TABLET BY MOUTH  DAILY, Disp: 90 tablet, Rfl: 0   aspirin 81 MG tablet, Take 81 mg by mouth at bedtime. , Disp: , Rfl:    azelastine (ASTELIN) 0.1 % nasal spray, PLACE 2 SPRAYS INTO BOTH NOSTRILS 2 (TWO) TIMES DAILY., Disp: 30 mL, Rfl: 5   baclofen (LIORESAL) 10 MG tablet, Take 1 tablet (10 mg total) by mouth 3 (three) times daily., Disp: 270 each, Rfl: 1   Coenzyme Q10 (CO Q 10) 100 MG CAPS, Take 1 capsule by mouth daily., Disp: , Rfl:    ergocalciferol (VITAMIN D2) 1.25 MG (50000 UT) capsule, Take 1 capsule (50,000 Units total) by mouth 2 (two) times a week., Disp: 24 capsule, Rfl: 3   levothyroxine (SYNTHROID) 150 MCG tablet, TAKE 1 TABLET BY MOUTH  DAILY BEFORE BREAKFAST  NEEDS TO BE SEEN FOR  FURTHER REFILLS, Disp: 90 tablet, Rfl: 0   LORazepam (ATIVAN) 0.5 MG tablet, Take 1 tablet (0.5 mg total) by mouth 2 (two) times daily as needed for anxiety. (Patient taking differently: Take 0.5 mg by mouth as needed for anxiety. ), Disp: 30 tablet, Rfl: 0   losartan (COZAAR)  50 MG tablet, TAKE 1 TABLET BY MOUTH  DAILY, Disp: 90 tablet, Rfl: 0   NON FORMULARY, Take 3 capsules by mouth every morning. Nugenix Testosterone Booster / Jacob Kerr , Disp: , Rfl:    omeprazole (PRILOSEC) 20 MG capsule, TAKE 1 CAPSULE BY MOUTH TWO TIMES DAILY, Disp: 180 capsule, Rfl: 1   UNABLE TO FIND, Med Name: CBD oil, Disp: , Rfl:    venlafaxine XR (EFFEXOR-XR) 75 MG 24 hr capsule, TAKE 1 CAPSULE BY MOUTH  DAILY WITH BREAKFAST, Disp: 90 capsule, Rfl: 1 Social History   Socioeconomic History   Marital status: Married    Spouse name: Jacob Kerr   Number of children: 2   Years of education: 12   Highest education level: 12th grade  Occupational History   Occupation: Retired    Fish farm manager: Chief of Staff  Tobacco Use   Smoking status: Never Smoker   Smokeless tobacco: Never Used  Brewing technologist Use: Never used  Substance and Sexual Activity   Alcohol use: Not Currently    Alcohol/week: 7.0 standard drinks    Types: 7 Shots of liquor per week    Comment: 2 at night   Drug use: No   Sexual activity: Not on file  Other Topics Concern   Not on file  Social History Narrative   Lives at home with wife.       Patient is right handed.   Patient drinks 1-2 cups of caffeine daily.   Social Determinants of Health   Financial Resource Strain:    Difficulty of Paying Living Expenses: Not on file  Food Insecurity:    Worried About Charity fundraiser in the Last Year: Not on file   [employer] of Food in the Last Year: Not on file  Transportation Needs:    Lack of Transportation (Medical): Not on file   Lack of Transportation (Non-Medical): Not on file  Physical Activity:    Days of Exercise per Week: Not on file   Minutes of Exercise per Session: Not on file  Stress:    Feeling of Stress : Not on file  Social Connections:    Frequency of Communication with Friends and Family: Not on file   Frequency of Social Gatherings with Friends and Family: Not on file   Attends Religious Services: Not on file   Active Member of Clubs or Organizations: Not on file   Attends Archivist Meetings: Not on file   Marital Status: Not on file  Intimate Partner Violence:    Fear of Current or Ex-Partner: Not on file   Emotionally Abused: Not on file   Physically Abused: Not on file   Sexually Abused: Not on file   Family History  Problem Relation Age of Onset   Prostate cancer Father    Heart disease Father    Stroke Father    Nephrolithiasis Sister    Cancer Sister        Intestinal   Hyperlipidemia Sister    Hyperlipidemia Sister    Cancer Sister        Sinus   COPD Other    Colon polyps Brother        x2   Colon polyps Sister    Colon cancer Neg Hx    Esophageal cancer Neg Hx     Objective: Office vital signs reviewed. BP  121/74    Pulse 67    Temp (!) 97 F (36.1 C)    Ht 6' (  1.829 m)    Wt 190 lb 6.4 oz (86.4 kg)    SpO2 98%    BMI 25.82 kg/m   Physical Examination:  General: Awake, alert, No acute distress HEENT: Normal, sclera white, MMM; no goiter or exophthalmos Cardio: regular rate and rhythm, S1S2 heard, no murmurs appreciated Pulm: clear to auscultation bilaterally, no wheezes, rhonchi or rales; normal work of breathing on room air Extremities: warm, well perfused, No edema, cyanosis or clubbing; +2 pulses bilaterally MSK: normal gait and station; stretches back often during evaluation  Assessment/ Plan: 69 y.o. male   1. Hypothyroidism due to acquired atrophy of thyroid Asymptomatic - Thyroid Panel With TSH  2. Essential hypertension Well-controlled - amLODipine (NORVASC) 10 MG tablet; Take 1 tablet (10 mg total) by mouth daily.  Dispense: 90 tablet; Refill: 3 - losartan (COZAAR) 50 MG tablet; Take 1 tablet (50 mg total) by mouth daily.  Dispense: 90 tablet; Refill: 3  3. Stage 3a chronic kidney disease Check renal function - losartan (COZAAR) 50 MG tablet; Take 1 tablet (50 mg total) by mouth daily.  Dispense: 90 tablet; Refill: 3  4. Gastroesophageal reflux disease without esophagitis Stable - omeprazole (PRILOSEC) 20 MG capsule; TAKE 1 CAPSULE BY MOUTH TWO TIMES DAILY  Dispense: 180 capsule; Refill: 1   No orders of the defined types were placed in this encounter.  No orders of the defined types were placed in this encounter.    Jacob Norlander, DO [place] [phone]

## 2020-05-22 DIAGNOSIS — K4090 Unilateral inguinal hernia, without obstruction or gangrene, not specified as recurrent: Secondary | ICD-10-CM | POA: Diagnosis not present

## 2020-05-22 LAB — THYROID PANEL WITH TSH
Free Thyroxine Index: 2.6 (ref 1.2–4.9)
T3 Uptake Ratio: 28 % (ref 24–39)
T4, Total: 9.2 ug/dL (ref 4.5–12.0)
TSH: 0.067 u[IU]/mL — ABNORMAL LOW (ref 0.450–4.500)

## 2020-05-27 DIAGNOSIS — M25521 Pain in right elbow: Secondary | ICD-10-CM | POA: Diagnosis not present

## 2020-05-29 DIAGNOSIS — M62838 Other muscle spasm: Secondary | ICD-10-CM | POA: Diagnosis not present

## 2020-06-04 ENCOUNTER — Other Ambulatory Visit: Payer: Self-pay | Admitting: Family Medicine

## 2020-06-10 DIAGNOSIS — R350 Frequency of micturition: Secondary | ICD-10-CM | POA: Diagnosis not present

## 2020-06-16 DIAGNOSIS — H5213 Myopia, bilateral: Secondary | ICD-10-CM | POA: Diagnosis not present

## 2020-06-16 DIAGNOSIS — H2513 Age-related nuclear cataract, bilateral: Secondary | ICD-10-CM | POA: Diagnosis not present

## 2020-06-16 DIAGNOSIS — H524 Presbyopia: Secondary | ICD-10-CM | POA: Diagnosis not present

## 2020-06-16 DIAGNOSIS — H52203 Unspecified astigmatism, bilateral: Secondary | ICD-10-CM | POA: Diagnosis not present

## 2020-06-21 ENCOUNTER — Other Ambulatory Visit: Payer: Self-pay | Admitting: Family Medicine

## 2020-07-02 DIAGNOSIS — R350 Frequency of micturition: Secondary | ICD-10-CM | POA: Diagnosis not present

## 2020-07-15 ENCOUNTER — Other Ambulatory Visit: Payer: Self-pay | Admitting: Family Medicine

## 2020-07-15 DIAGNOSIS — E034 Atrophy of thyroid (acquired): Secondary | ICD-10-CM

## 2020-07-16 NOTE — Telephone Encounter (Signed)
Dose change per 05/21/20 labwork

## 2020-08-01 ENCOUNTER — Encounter: Payer: Self-pay | Admitting: Family Medicine

## 2020-08-01 NOTE — Telephone Encounter (Signed)
Ok for letter

## 2020-09-04 DIAGNOSIS — L821 Other seborrheic keratosis: Secondary | ICD-10-CM | POA: Diagnosis not present

## 2020-09-04 DIAGNOSIS — R21 Rash and other nonspecific skin eruption: Secondary | ICD-10-CM | POA: Diagnosis not present

## 2020-09-04 DIAGNOSIS — L579 Skin changes due to chronic exposure to nonionizing radiation, unspecified: Secondary | ICD-10-CM | POA: Diagnosis not present

## 2020-09-04 DIAGNOSIS — L814 Other melanin hyperpigmentation: Secondary | ICD-10-CM | POA: Diagnosis not present

## 2020-09-04 DIAGNOSIS — Z85828 Personal history of other malignant neoplasm of skin: Secondary | ICD-10-CM | POA: Diagnosis not present

## 2020-09-17 DIAGNOSIS — M546 Pain in thoracic spine: Secondary | ICD-10-CM | POA: Diagnosis not present

## 2020-09-17 DIAGNOSIS — R350 Frequency of micturition: Secondary | ICD-10-CM | POA: Diagnosis not present

## 2020-09-22 DIAGNOSIS — Z85828 Personal history of other malignant neoplasm of skin: Secondary | ICD-10-CM | POA: Diagnosis not present

## 2020-09-22 DIAGNOSIS — R21 Rash and other nonspecific skin eruption: Secondary | ICD-10-CM | POA: Diagnosis not present

## 2020-10-02 DIAGNOSIS — M6281 Muscle weakness (generalized): Secondary | ICD-10-CM | POA: Diagnosis not present

## 2020-10-02 DIAGNOSIS — M62838 Other muscle spasm: Secondary | ICD-10-CM | POA: Diagnosis not present

## 2020-10-08 ENCOUNTER — Other Ambulatory Visit: Payer: Self-pay | Admitting: Family Medicine

## 2020-10-08 DIAGNOSIS — E034 Atrophy of thyroid (acquired): Secondary | ICD-10-CM

## 2020-10-09 NOTE — Telephone Encounter (Signed)
[place]. NTBS Needs repeat Thyroid from August.

## 2020-10-10 DIAGNOSIS — M546 Pain in thoracic spine: Secondary | ICD-10-CM | POA: Diagnosis not present

## 2020-10-10 DIAGNOSIS — M62838 Other muscle spasm: Secondary | ICD-10-CM | POA: Diagnosis not present

## 2020-10-10 DIAGNOSIS — M6281 Muscle weakness (generalized): Secondary | ICD-10-CM | POA: Diagnosis not present

## 2020-10-30 ENCOUNTER — Other Ambulatory Visit: Payer: Self-pay | Admitting: Family Medicine

## 2020-11-02 ENCOUNTER — Other Ambulatory Visit: Payer: Self-pay | Admitting: Hematology

## 2020-11-06 DIAGNOSIS — M62838 Other muscle spasm: Secondary | ICD-10-CM | POA: Diagnosis not present

## 2020-11-06 DIAGNOSIS — M6281 Muscle weakness (generalized): Secondary | ICD-10-CM | POA: Diagnosis not present

## 2020-11-12 ENCOUNTER — Other Ambulatory Visit: Payer: Self-pay | Admitting: Family Medicine

## 2020-11-12 DIAGNOSIS — K219 Gastro-esophageal reflux disease without esophagitis: Secondary | ICD-10-CM

## 2020-11-13 DIAGNOSIS — M6208 Separation of muscle (nontraumatic), other site: Secondary | ICD-10-CM | POA: Diagnosis not present

## 2020-11-13 DIAGNOSIS — M6281 Muscle weakness (generalized): Secondary | ICD-10-CM | POA: Diagnosis not present

## 2020-11-13 DIAGNOSIS — M62838 Other muscle spasm: Secondary | ICD-10-CM | POA: Diagnosis not present

## 2020-11-21 DIAGNOSIS — M6208 Separation of muscle (nontraumatic), other site: Secondary | ICD-10-CM | POA: Diagnosis not present

## 2020-11-21 DIAGNOSIS — M62838 Other muscle spasm: Secondary | ICD-10-CM | POA: Diagnosis not present

## 2020-11-21 DIAGNOSIS — M6281 Muscle weakness (generalized): Secondary | ICD-10-CM | POA: Diagnosis not present

## 2020-11-27 DIAGNOSIS — M62838 Other muscle spasm: Secondary | ICD-10-CM | POA: Diagnosis not present

## 2020-11-27 DIAGNOSIS — M6281 Muscle weakness (generalized): Secondary | ICD-10-CM | POA: Diagnosis not present

## 2020-12-01 ENCOUNTER — Encounter: Payer: Self-pay | Admitting: Family Medicine

## 2020-12-04 ENCOUNTER — Other Ambulatory Visit: Payer: Self-pay | Admitting: Family Medicine

## 2020-12-04 DIAGNOSIS — M62838 Other muscle spasm: Secondary | ICD-10-CM | POA: Diagnosis not present

## 2020-12-04 DIAGNOSIS — M6281 Muscle weakness (generalized): Secondary | ICD-10-CM | POA: Diagnosis not present

## 2020-12-04 DIAGNOSIS — M546 Pain in thoracic spine: Secondary | ICD-10-CM | POA: Diagnosis not present

## 2020-12-08 ENCOUNTER — Other Ambulatory Visit: Payer: Self-pay | Admitting: Family Medicine

## 2020-12-08 DIAGNOSIS — E034 Atrophy of thyroid (acquired): Secondary | ICD-10-CM

## 2020-12-09 ENCOUNTER — Ambulatory Visit (INDEPENDENT_AMBULATORY_CARE_PROVIDER_SITE_OTHER): Payer: Medicare Other | Admitting: Family Medicine

## 2020-12-09 ENCOUNTER — Other Ambulatory Visit: Payer: Self-pay

## 2020-12-09 VITALS — BP 138/80 | HR 75 | Temp 98.3°F | Ht 72.0 in | Wt 190.0 lb

## 2020-12-09 DIAGNOSIS — G459 Transient cerebral ischemic attack, unspecified: Secondary | ICD-10-CM | POA: Diagnosis not present

## 2020-12-09 DIAGNOSIS — E034 Atrophy of thyroid (acquired): Secondary | ICD-10-CM | POA: Diagnosis not present

## 2020-12-09 DIAGNOSIS — N509 Disorder of male genital organs, unspecified: Secondary | ICD-10-CM

## 2020-12-09 DIAGNOSIS — L720 Epidermal cyst: Secondary | ICD-10-CM

## 2020-12-09 DIAGNOSIS — N1831 Chronic kidney disease, stage 3a: Secondary | ICD-10-CM

## 2020-12-09 DIAGNOSIS — N400 Enlarged prostate without lower urinary tract symptoms: Secondary | ICD-10-CM

## 2020-12-09 DIAGNOSIS — I10 Essential (primary) hypertension: Secondary | ICD-10-CM

## 2020-12-09 NOTE — Progress Notes (Signed)
Subjective: CC: Skin tags/ moles PCP: Janora Norlander, DO [id number] R Jacob Kerr is a 70 y.o. male presenting to clinic today for:  1.  Skin tags/ scrotal lesion Patient reports that he has had a lesion on the right side of his back and right side of his neck for quite some time now.  He reports them to be irritating and he would like to have these removed if possible.  Does not report any bleeding, swelling or erythema.  No discharge  He also has a lesion on his scrotum that he was previously told was cystic in nature.  He has not brought it to the attention of his urologist but will be seeing him tomorrow.  Does not report any discomfort in that area.   ROS: Per HPI  Allergies  Allergen Reactions  . Cymbalta [Duloxetine Hcl] Other (See Comments)    Depression  . Lyrica [Pregabalin] Other (See Comments)    Paranoid  . Neurontin [Gabapentin] Other (See Comments)    nightmares  . Robaxin [Methocarbamol] Other (See Comments)    Seeing "bright lights"  . Sudafed [Pseudoephedrine Hcl] Other (See Comments)    Increased heart rate  . Epinephrine Palpitations    Decreased blood pressure   Past Medical History:  Diagnosis Date  . Acute meniscal tear of left knee   . Allergic rhinitis   . Anal fissure   . Anxiety   . BPH (benign prostatic hypertrophy)   . Cervical spondylosis without myelopathy   . Cervicogenic headache   . Chronic headaches   . Chronic neck pain    uses TENS unit  prn  . Diverticulitis   . Gallstones   . GERD (gastroesophageal reflux disease)   . H/O hiatal hernia   . History of diverticulitis   . History of kidney stones   . History of TIA (transient ischemic attack)    04/ 2011---  no residuals  . Hypertension   . Hypothyroidism   . Peripheral neuropathy   . Shingles   . Syrinx of spinal cord (HCC)    C4 -- C7    Current Outpatient Medications:  .  amitriptyline (ELAVIL) 10 MG tablet, TAKE 1 TABLET BY MOUTH EVERYDAY AT BEDTIME, Disp: 90 tablet,  Rfl: 2 .  amLODipine (NORVASC) 10 MG tablet, Take 1 tablet (10 mg total) by mouth daily., Disp: 90 tablet, Rfl: 3 .  aspirin 81 MG tablet, Take 81 mg by mouth at bedtime. , Disp: , Rfl:  .  azelastine (ASTELIN) 0.1 % nasal spray, PLACE 2 SPRAYS INTO BOTH NOSTRILS 2 (TWO) TIMES DAILY., Disp: 30 mL, Rfl: 5 .  baclofen (LIORESAL) 10 MG tablet, TAKE 1 TABLET BY MOUTH TWICE A DAY, Disp: 180 tablet, Rfl: 1 .  Coenzyme Q10 (CO Q 10) 100 MG CAPS, Take 1 capsule by mouth daily., Disp: , Rfl:  .  levothyroxine (SYNTHROID) 150 MCG tablet, TAKE 1 TABLET BY MOUTH  DAILY BEFORE BREAKFAST  EXCEPT 1/2 TABLET ON SUNDAY, Disp: 84 tablet, Rfl: 0 .  LORazepam (ATIVAN) 0.5 MG tablet, Take 1 tablet (0.5 mg total) by mouth 2 (two) times daily as needed for anxiety. (Patient taking differently: Take 0.5 mg by mouth as needed for anxiety. ), Disp: 30 tablet, Rfl: 0 .  losartan (COZAAR) 50 MG tablet, Take 1 tablet (50 mg total) by mouth daily., Disp: 90 tablet, Rfl: 3 .  NON FORMULARY, Take 3 capsules by mouth every morning. Nugenix Testosterone Booster / [place] , Disp: , Rfl:  .  omeprazole (PRILOSEC) 20 MG capsule, TAKE 1 CAPSULE BY MOUTH  TWICE DAILY, Disp: 180 capsule, Rfl: 1 .  UNABLE TO FIND, Med Name: CBD oil, Disp: , Rfl:  .  venlafaxine XR (EFFEXOR-XR) 75 MG 24 hr capsule, TAKE 1 CAPSULE BY MOUTH  DAILY WITH BREAKFAST  NEEDS TO BE SEEN FOR FURTHER REFILLS, Disp: 90 capsule, Rfl: 0 .  Vitamin D, Ergocalciferol, (DRISDOL) 1.25 MG (50000 UNIT) CAPS capsule, TAKE 1 CAPSULE (50,000 UNITS TOTAL) BY MOUTH 2 (TWO) TIMES A WEEK., Disp: 24 capsule, Rfl: 3 Social History   Socioeconomic History  . Marital status: Married    Spouse name: [name]  . Number of children: 2  . Years of education: 61  . Highest education level: 12th grade  Occupational History  . Occupation: Retired    Fish farm manager: Chief of Staff  Tobacco Use  . Smoking status: Never Smoker  . Smokeless tobacco: Never Used  Vaping Use  . Vaping Use: Never used   Substance and Sexual Activity  . Alcohol use: Not Currently    Alcohol/week: 7.0 standard drinks    Types: 7 Shots of liquor per week    Comment: 2 at night  . Drug use: No  . Sexual activity: Not on file  Other Topics Concern  . Not on file  Social History Narrative   Lives at home with wife.       Patient is right handed.   Patient drinks 1-2 cups of caffeine daily.   Social Determinants of Health   Financial Resource Strain: Not on file  Food Insecurity: Not on file  Transportation Needs: Not on file  Physical Activity: Not on file  Stress: Not on file  Social Connections: Not on file  Intimate Partner Violence: Not on file   Family History  Problem Relation Age of Onset  . Prostate cancer Father   . Heart disease Father   . Stroke Father   . Nephrolithiasis Sister   . Cancer Sister        Intestinal  . Hyperlipidemia Sister   . Hyperlipidemia Sister   . Cancer Sister        Sinus  . COPD Other   . Colon polyps Brother        x2  . Colon polyps Sister   . Colon cancer Neg Hx   . Esophageal cancer Neg Hx     Objective: Office vital signs reviewed. BP 138/80   Pulse 75   Temp 98.3 F (36.8 C) (Temporal)   Ht 6' (1.829 m)   Wt 190 lb (86.2 kg)   SpO2 99%   BMI 25.77 kg/m   Physical Examination:  General: Awake, alert, No acute distress Skin: Tiny epidermal cyst with appreciable blackhead noted along the right posterior lateral aspect of the neck, a very similar lesion noted along the right flank.  The lesion along the scrotum appears to be a yellow, irregularly shaped firm mass that would suggest calcium deposition versus cholesterol deposition.  It did not appear consistent with a cyst.  PROCEDURE NOTE: Pore extraction  Patient given informed consent, verbal consent obtained.  Appropriate time out taken. Areas of concern cleansed with alcohol swabs x2.  18 g needle used to loosen the blackheads and sterile pickups were used to extract them.  The  patient tolerated the procedure well.  Minimal bleeding.  Patient given post procedure instructions.    Assessment/ Plan: 70 y.o. male   Epidermoid cyst of skin of back  Epidermal cyst of  neck  Scrotal lesion  Hypothyroidism due to acquired atrophy of thyroid - Plan: TSH, T4, Free  Essential hypertension - Plan: Renal Function Panel  Stage 3a chronic kidney disease ([place]) - Plan: Renal Function Panel, CBC, VITAMIN D 25 Hydroxy (Vit-D Deficiency, Fractures)  TIA (transient ischemic attack) - Plan: Lipid Panel  Benign prostatic hyperplasia without lower urinary tract symptoms - Plan: PSA  The main focus of our appointment today where the cystic lesions of his neck, back and scrotum.  He wanted to get labs done today and therefore the other items were addressed only very briefly.  Extraction of the lesions on his back and neck were performed today using tweezers and an 18-gauge needle.  Both of these were successful and there were no immediate complications.  Keep lesions clean and they should heal over the next couple of days.  Would like his urologist to address the scrotal lesion.  It almost appeared to be like a cholesterol or calcium deposit.  It did not quite have a cystic appearance and there were no visible pores today.  No orders of the defined types were placed in this encounter.  No orders of the defined types were placed in this encounter.    Janora Norlander, DO [place] [phone]

## 2020-12-10 ENCOUNTER — Encounter: Payer: Self-pay | Admitting: Family Medicine

## 2020-12-10 LAB — VITAMIN D 25 HYDROXY (VIT D DEFICIENCY, FRACTURES): Vit D, 25-Hydroxy: 85.4 ng/mL (ref 30.0–100.0)

## 2020-12-10 LAB — RENAL FUNCTION PANEL
Albumin: 4.4 g/dL (ref 3.8–4.8)
BUN/Creatinine Ratio: 10 (ref 10–24)
BUN: 13 mg/dL (ref 8–27)
CO2: 24 mmol/L (ref 20–29)
Calcium: 9.5 mg/dL (ref 8.6–10.2)
Chloride: 101 mmol/L (ref 96–106)
Creatinine, Ser: 1.24 mg/dL (ref 0.76–1.27)
Glucose: 126 mg/dL — ABNORMAL HIGH (ref 65–99)
Phosphorus: 3.3 mg/dL (ref 2.8–4.1)
Potassium: 3.8 mmol/L (ref 3.5–5.2)
Sodium: 139 mmol/L (ref 134–144)
eGFR: 63 mL/min/{1.73_m2} (ref >59)

## 2020-12-10 LAB — CBC
Hematocrit: 52.7 % — ABNORMAL HIGH (ref 37.5–51.0)
Hemoglobin: 17.7 g/dL (ref 13.0–17.7)
MCH: 33.2 pg — ABNORMAL HIGH (ref 26.6–33.0)
MCHC: 33.6 g/dL (ref 31.5–35.7)
MCV: 99 fL — ABNORMAL HIGH (ref 79–97)
Platelets: 222 10*3/uL (ref 150–450)
RBC: 5.33 x10E6/uL (ref 4.14–5.80)
RDW: 12.4 % (ref 11.6–15.4)
WBC: 4.9 10*3/uL (ref 3.4–10.8)

## 2020-12-10 LAB — LIPID PANEL
Chol/HDL Ratio: 3.7 ratio (ref 0.0–5.0)
Cholesterol, Total: 152 mg/dL (ref 100–199)
HDL: 41 mg/dL (ref >39)
LDL Chol Calc (NIH): 92 mg/dL (ref 0–99)
Triglycerides: 103 mg/dL (ref 0–149)
VLDL Cholesterol Cal: 19 mg/dL (ref 5–40)

## 2020-12-10 LAB — PSA: Prostate Specific Ag, Serum: 0.4 ng/mL (ref 0.0–4.0)

## 2020-12-10 LAB — TSH: TSH: 0.656 u[IU]/mL (ref 0.450–4.500)

## 2020-12-10 LAB — T4, FREE: Free T4: 1.51 ng/dL (ref 0.82–1.77)

## 2020-12-17 DIAGNOSIS — M62838 Other muscle spasm: Secondary | ICD-10-CM | POA: Diagnosis not present

## 2020-12-17 DIAGNOSIS — M6281 Muscle weakness (generalized): Secondary | ICD-10-CM | POA: Diagnosis not present

## 2020-12-29 ENCOUNTER — Ambulatory Visit: Payer: Medicare Other | Admitting: Family Medicine

## 2021-01-12 ENCOUNTER — Encounter: Payer: Self-pay | Admitting: Family Medicine

## 2021-01-13 ENCOUNTER — Ambulatory Visit: Payer: Medicare Other | Admitting: Family Medicine

## 2021-01-20 ENCOUNTER — Other Ambulatory Visit: Payer: Self-pay | Admitting: Family Medicine

## 2021-02-09 ENCOUNTER — Ambulatory Visit (INDEPENDENT_AMBULATORY_CARE_PROVIDER_SITE_OTHER): Payer: Medicare Other

## 2021-02-09 VITALS — Ht 72.0 in | Wt 190.0 lb

## 2021-02-09 DIAGNOSIS — Z0000 Encounter for general adult medical examination without abnormal findings: Secondary | ICD-10-CM

## 2021-02-09 NOTE — Progress Notes (Signed)
Subjective:   Jacob Kerr is a 70 y.o. male who presents for Medicare Annual/Subsequent preventive examination.  Virtual Visit via Telephone Note  I connected with  Jacob Kerr on 02/09/21 at  2:45 PM EDT by telephone and verified that I am speaking with the correct person using two identifiers.  Location: Patient: Home Provider: [facility] Persons participating in the virtual visit: patient/Nurse Health Advisor   I discussed the limitations, risks, security and privacy concerns of performing an evaluation and management service by telephone and the availability of in person appointments. The patient expressed understanding and agreed to proceed.  Interactive audio and video telecommunications were attempted between this nurse and patient, however failed, due to patient having technical difficulties OR patient did not have access to video capability.  We continued and completed visit with audio only.  Some vital signs may be absent or patient reported.   Jacob Kerr Jacob Royelle Hinchman, LPN    Review of Systems     Cardiac Risk Factors include: advanced age (>56men, >5 women);male gender;hypertension     Objective:    Today's Vitals   02/09/21 1356  Weight: 190 lb (86.2 kg)  Height: 6' (1.829 m)  PainSc: 5    Body mass index is 25.77 kg/m.  Advanced Directives 02/09/2021 04/03/2020 02/07/2020 01/22/2020 11/08/2019 04/11/2019 02/06/2019  Does Patient Have a Medical Advance Directive? No No No No No No No  Would patient like information on creating a medical advance directive? No - Patient declined No - Patient declined No - Patient declined No - Patient declined Yes (MAU/Ambulatory/Procedural Areas - Information given) No - Patient declined Yes (MAU/Ambulatory/Procedural Areas - Information given)    Current Medications (verified) Outpatient Encounter Medications as of 02/09/2021  Medication Sig  . amLODipine (NORVASC) 10 MG tablet Take 1 tablet (10 mg total) by mouth daily.  Marland Kitchen aspirin 81 MG  tablet Take 81 mg by mouth at bedtime.  Marland Kitchen azelastine (ASTELIN) 0.1 % nasal spray PLACE 2 SPRAYS INTO BOTH NOSTRILS 2 (TWO) TIMES DAILY.  . baclofen (LIORESAL) 10 MG tablet TAKE 1 TABLET BY MOUTH TWICE A DAY  . Coenzyme Q10 (CO Q 10) 100 MG CAPS Take 1 capsule by mouth daily.  Marland Kitchen levothyroxine (SYNTHROID) 150 MCG tablet TAKE 1 TABLET BY MOUTH  DAILY BEFORE BREAKFAST  EXCEPT ONE-HALF TABLET BY  MOUTH ON SUNDAY  . losartan (COZAAR) 50 MG tablet Take 1 tablet (50 mg total) by mouth daily.  . NON FORMULARY Take 3 capsules by mouth every morning. Nugenix Testosterone Booster / Jacob Kerr  . omeprazole (PRILOSEC) 20 MG capsule TAKE 1 CAPSULE BY MOUTH  TWICE DAILY  . venlafaxine XR (EFFEXOR-XR) 75 MG 24 hr capsule TAKE 1 CAPSULE BY MOUTH  DAILY WITH BREAKFAST  . Vitamin D, Ergocalciferol, (DRISDOL) 1.25 MG (50000 UNIT) CAPS capsule TAKE 1 CAPSULE (50,000 UNITS TOTAL) BY MOUTH 2 (TWO) TIMES A WEEK.  Marland Kitchen amitriptyline (ELAVIL) 10 MG tablet TAKE 1 TABLET BY MOUTH EVERYDAY AT BEDTIME (Patient not taking: No sig reported)  . LORazepam (ATIVAN) 0.5 MG tablet Take 1 tablet (0.5 mg total) by mouth 2 (two) times daily as needed for anxiety. (Patient not taking: Reported on 02/09/2021)   No facility-administered encounter medications on file as of 02/09/2021.    Allergies (verified) Cymbalta [duloxetine hcl], Lyrica [pregabalin], Neurontin [gabapentin], Robaxin [methocarbamol], Sudafed [pseudoephedrine hcl], and Epinephrine   History: Past Medical History:  Diagnosis Date  . Acute meniscal tear of left knee   . Allergic rhinitis   .  Anal fissure   . Anxiety   . BPH (benign prostatic hypertrophy)   . Cervical spondylosis without myelopathy   . Cervicogenic headache   . Chronic headaches   . Chronic neck pain    uses TENS unit  prn  . Diverticulitis   . Gallstones   . GERD (gastroesophageal reflux disease)   . H/O hiatal hernia   . History of diverticulitis   . History of kidney stones   . History of TIA  (transient ischemic attack)    04/ 2011---  no residuals  . Hypertension   . Hypothyroidism   . Peripheral neuropathy   . Shingles   . Syrinx of spinal cord ([place])    C4 -- C7   Past Surgical History:  Procedure Laterality Date  . CARDIAC CATHETERIZATION  02-18-2010   dr Angelena Form   normal coronary arteries/  normal lvsf--  ef 65%  . COLONOSCOPY    . [place]  . HERNIA REPAIR     [facility] [place] [facility]  . KNEE ARTHROSCOPY Left 03/13/2014   Procedure: LEFT ARTHROSCOPY KNEE WITH DEBRIDMENT;  Surgeon: Gearlean Alf, MD;  Location: [facility];  Service: Orthopedics;  Laterality: Left;  . LAPAROSCOPIC CHOLECYSTECTOMY  01-30-2003  . NEGATIVE SLEEP STUDY  2013   per pt  . RADIOFREQUENCY ABLATION NERVES  05/2016  . REMOVAL FORGEIN BODY FROM EAR  AGE 67  . RIGHT URETEROSCOPIC LASER LITHOTRIPSY STONE EXTRACTION /  STENT PLACEMENT  05-10-2000  . TRANSTHORACIC ECHOCARDIOGRAM  01-16-2010   normal lvf/  ef  60%/  mild lae   Family History  Problem Relation Age of Onset  . Prostate cancer Father   . Heart disease Father   . Stroke Father   . Nephrolithiasis Sister   . Cancer Sister        Intestinal  . Hyperlipidemia Sister   . Hyperlipidemia Sister   . Cancer Sister        Sinus  . COPD Other   . Colon polyps Brother        x2  . Colon polyps Sister   . Colon cancer Neg Hx   . Esophageal cancer Neg Hx    Social History   Socioeconomic History  . Marital status: Married    Spouse name: Jacob Kerr  . Number of children: 2  . Years of education: 75  . Highest education level: 12th grade  Occupational History  . Occupation: Retired    Fish farm manager: Chief of Staff  Tobacco Use  . Smoking status: Never Smoker  . Smokeless tobacco: Never Used  Vaping Use  . Vaping Use: Never used  Substance and Sexual Activity  . Alcohol use: Not Currently    Alcohol/week: 7.0 standard drinks    Types: 7 Shots of liquor per week    Comment: 2 at night  . Drug use:  No  . Sexual activity: Not on file  Other Topics Concern  . Not on file  Social History Narrative   Lives at home with wife.       Patient is right handed.   Patient drinks 1-2 cups of caffeine daily.   Social Determinants of Health   Financial Resource Strain: Low Risk   . Difficulty of Paying Living Expenses: Not hard at all  Food Insecurity: No Food Insecurity  . Worried About Charity fundraiser in the Last Year: Never true  . Ran Out of Food in the Last Year: Never true  Transportation Needs:  No Transportation Needs  . Lack of Transportation (Medical): No  . Lack of Transportation (Non-Medical): No  Physical Activity: Sufficiently Active  . Days of Exercise per Week: 7 days  . Minutes of Exercise per Session: 40 min  Stress: No Stress Concern Present  . Feeling of Stress : Not at all  Social Connections: Moderately Isolated  . Frequency of Communication with Friends and Family: More than three times a week  . Frequency of Social Gatherings with Friends and Family: More than three times a week  . Attends Religious Services: Never  . Active Member of Clubs or Organizations: No  . Attends Archivist Meetings: Never  . Marital Status: Married    Tobacco Counseling Counseling given: Not Answered   Clinical Intake:  Pre-visit preparation completed: Yes  Pain : 0-10 Pain Score: 5  Pain Type: Chronic pain Pain Location: Back Pain Orientation: Lower Pain Descriptors / Indicators: Aching Pain Onset: More than a month ago Pain Frequency: Intermittent     BMI - recorded: 25.77 Nutritional Status: BMI 25 -29 Overweight Nutritional Risks: Nausea/ vomitting/ diarrhea (nausea when pain gets really bad) Diabetes: No  How often do you need to have someone help you when you read instructions, pamphlets, or other written materials from your doctor or pharmacy?: 1 - Never  Diabetic? no  Interpreter Needed?: No  Information entered by :: Skyleigh Windle,  LPN   Activities of Daily Living In your present state of health, do you have any difficulty performing the following activities: 02/09/2021  Hearing? N  Vision? N  Difficulty concentrating or making decisions? N  Walking or climbing stairs? N  Dressing or bathing? N  Doing errands, shopping? N  Preparing Food and eating ? N  Using the Toilet? N  In the past six months, have you accidently leaked urine? Y  Do you have problems with loss of bowel control? Y  Comment only when he has back spasms really bad  Managing your Medications? N  Managing your Finances? N  Housekeeping or managing your Housekeeping? N  Some recent data might be hidden    Patient Care Team: Janora Norlander, DO as PCP - General (Family Medicine) Luna Kitchens., MD (Neurosurgery) Sable Feil, MD (Gastroenterology) Kathrynn Ducking, MD (Neurology) Edrick Oh, MD (Nephrology) Minus Breeding, MD as Consulting Physician (Cardiology) Janora Norlander, DO as Consulting Physician (Family Medicine)  Indicate any recent Medical Services you may have received from other than [facility] providers in the past year (date may be approximate).     Assessment:   This is a routine wellness examination for Jacob Kerr.  Hearing/Vision screen  Hearing Screening   125Hz  250Hz  500Hz  1000Hz  2000Hz  3000Hz  4000Hz  6000Hz  8000Hz   Right ear:           Left ear:           Comments: Denies hearing difficulties Has chronic tinnitus   Vision Screening Comments: Wears eyeglasses for reading - [facility] - up to date with eye exam  Dietary issues and exercise activities discussed: Current Exercise Habits: Home exercise routine, Type of exercise: walking, Time (Minutes): 30, Frequency (Times/Week): 7, Weekly Exercise (Minutes/Week): 210, Intensity: Mild, Exercise limited by: orthopedic condition(s)  Goals Addressed            This Visit's Progress   . Patient Stated   On track     02/07/2020 AWV Goal: Fall Prevention  . Over the next year, patient will decrease  their risk for falls by: o Using assistive devices, such as a cane or walker, as needed o Identifying fall risks within their home and correcting them by: - Removing throw rugs - Adding handrails to stairs or ramps - Removing clutter and keeping a clear pathway throughout the home - Increasing light, especially at night - Adding shower handles/bars - Raising toilet seat o Identifying potential personal risk factors for falls: - Medication side effects - Incontinence/urgency - Vestibular dysfunction - Hearing loss - Musculoskeletal disorders - Neurological disorders - Orthostatic hypotension        Depression Screen PHQ 2/9 Scores 02/09/2021 12/09/2020 05/21/2020 02/07/2020 01/10/2020 06/14/2019 03/28/2019  PHQ - 2 Score 0 0 0 0 0 0 0  PHQ- 9 Score - 0 - - 10 - -    Fall Risk Fall Risk  02/09/2021 12/09/2020 05/21/2020 05/21/2020 02/07/2020  Falls in the past year? 1 0 1 0 1  Number falls in past yr: 0 - 1 - 1  Comment - - - - -  Injury with Fall? 0 - 0 - 0  Risk for fall due to : History of fall(s);Impaired balance/gait;Orthopedic patient - - - Impaired balance/gait  Follow up Falls prevention discussed;Education provided - Falls evaluation completed - Falls prevention discussed    FALL RISK PREVENTION PERTAINING TO THE HOME:  Any stairs in or around the home? Yes  If so, are there any without handrails? No  Home free of loose throw rugs in walkways, pet beds, electrical cords, etc? Yes  Adequate lighting in your home to reduce risk of falls? Yes   ASSISTIVE DEVICES UTILIZED TO PREVENT FALLS:  Life alert? No  Use of a cane, walker or w/c? Yes  Grab bars in the bathroom? Yes  Shower chair or bench in shower? No  Elevated toilet seat or a handicapped toilet? No   TIMED UP AND GO:  Was the test performed? No . telephonic visit  Cognitive Function: Normal cognitive status assessed by direct  observation by this Nurse Health Advisor. No abnormalities found.      6CIT Screen 02/07/2020 02/06/2019  What Year? 0 points 0 points  What month? 0 points 0 points  What time? 0 points 0 points  Count back from 20 0 points 0 points  Months in reverse 0 points 2 points  Repeat phrase 0 points 0 points  Total Score 0 2    Immunizations Immunization History  Administered Date(s) Administered  . Influenza Whole 06/27/2012  . Influenza, High Dose Seasonal PF 07/21/2018  . Influenza,inj,Quad PF,6+ Mos 07/04/2013, 07/12/2014, 07/31/2015, 08/18/2016  . Influenza-Unspecified 07/13/2017  . Pneumococcal Conjugate-13 03/07/2018  . Pneumococcal Polysaccharide-23 03/28/2019  . Tdap 09/27/2005, 06/23/2011  . Zoster 05/20/2014    TDAP status: Up to date  Flu Vaccine status: Due, Education has been provided regarding the importance of this vaccine. Advised may receive this vaccine at local pharmacy or Health Dept. Aware to provide a copy of the vaccination record if obtained from local pharmacy or Health Dept. Verbalized acceptance and understanding.  Pneumococcal vaccine status: Up to date  Covid-19 vaccine status: Declined, Education has been provided regarding the importance of this vaccine but patient still declined. Advised may receive this vaccine at local pharmacy or Health Dept.or vaccine clinic. Aware to provide a copy of the vaccination record if obtained from local pharmacy or Health Dept. Verbalized acceptance and understanding.  Qualifies for Shingles Vaccine? Yes   Zostavax completed Yes   Shingrix Completed?: No.  Education has been provided regarding the importance of this vaccine. Patient has been advised to call insurance company to determine out of pocket expense if they have not yet received this vaccine. Advised may also receive vaccine at local pharmacy or Health Dept. Verbalized acceptance and understanding.  Screening Tests Health Maintenance  Topic Date Due  .  COVID-19 Vaccine (1) Never done  . INFLUENZA VACCINE  04/27/2021  . TETANUS/TDAP  06/22/2021  . COLONOSCOPY (Pts 45-72yrs Insurance coverage will need to be confirmed)  12/29/2024  . Hepatitis C Screening  Completed  . PNA vac Low Risk Adult  Completed  . HPV VACCINES  Aged Out    Health Maintenance  Health Maintenance Due  Topic Date Due  . COVID-19 Vaccine (1) Never done    Colorectal cancer screening: Type of screening: Colonoscopy. Completed 02/02/2016. Repeat every 10 years  Lung Cancer Screening: (Low Dose CT Chest recommended if Age 44-80 years, 30 pack-year currently smoking OR have quit w/in 15years.) does not qualify.   Additional Screening:  Hepatitis C Screening: does qualify; Completed 03/10/2018  Vision Screening: Recommended annual ophthalmology exams for early detection of glaucoma and other disorders of the eye. Is the patient up to date with their annual eye exam?  Yes  Who is the provider or what is the name of the office in which the patient attends annual eye exams? [facility] If pt is not established with a provider, would they like to be referred to a provider to establish care? No .   Dental Screening: Recommended annual dental exams for proper oral hygiene  Community Resource Referral / Chronic Care Management: CRR required this visit?  No   CCM required this visit?  No      Plan:     I have personally reviewed and noted the following in the patient's chart:   . Medical and social history . Use of alcohol, tobacco or illicit drugs  . Current medications and supplements including opioid prescriptions. Patient is not currently taking opioid prescriptions. . Functional ability and status . Nutritional status . Physical activity . Advanced directives . List of other physicians . Hospitalizations, surgeries, and ER visits in previous 12 months . Vitals . Screenings to include cognitive, depression, and falls . Referrals and  appointments  In addition, I have reviewed and discussed with patient certain preventive protocols, quality metrics, and best practice recommendations. A written personalized care plan for preventive services as well as general preventive health recommendations were provided to patient.     Sandrea Hammond, LPN   [id number]   Nurse Notes: None

## 2021-02-09 NOTE — Patient Instructions (Addendum)
Jacob Kerr , Thank you for taking time to come for your Medicare Wellness Visit. I appreciate your ongoing commitment to your health goals. Please review the following plan we discussed and let me know if I can assist you in the future.   Screening recommendations/referrals: Colonoscopy: Done 02/02/2016 - Repeat in 10 years Recommended yearly ophthalmology/optometry visit for glaucoma screening and checkup Recommended yearly dental visit for hygiene and checkup  Vaccinations: Influenza vaccine: Due in Fall 2022 Pneumococcal vaccine: Done 03/07/2018 & 03/28/2019 Tdap vaccine: Done 06/23/2011 - Repeat in 10 years (due this fall) Shingles vaccine: Zostavax done 05/20/2014; due for Shingrix discussed. Please contact your pharmacy for coverage information.    Covid-19: Declined  Advanced directives: Please bring a copy of your health care power of attorney and living will to the office to be added to your chart at your convenience.  Conditions/risks identified: Aim for 30 minutes of exercise or brisk walking each day, drink 6-8 glasses of water and eat lots of fruits and vegetables.  Next appointment: Follow up in one year for your annual wellness visit.   Preventive Care 43 Years and Older, Male  Preventive care refers to lifestyle choices and visits with your health care provider that can promote health and wellness. What does preventive care include?  A yearly physical exam. This is also called an annual well check.  Dental exams once or twice a year.  Routine eye exams. Ask your health care provider how often you should have your eyes checked.  Personal lifestyle choices, including:  Daily care of your teeth and gums.  Regular physical activity.  Eating a healthy diet.  Avoiding tobacco and drug use.  Limiting alcohol use.  Practicing safe sex.  Taking low doses of aspirin every day.  Taking vitamin and mineral supplements as recommended by your health care provider. What  happens during an annual well check? The services and screenings done by your health care provider during your annual well check will depend on your age, overall health, lifestyle risk factors, and family history of disease. Counseling  Your health care provider may ask you questions about your:  Alcohol use.  Tobacco use.  Drug use.  Emotional well-being.  Home and relationship well-being.  Sexual activity.  Eating habits.  History of falls.  Memory and ability to understand (cognition).  Work and work Statistician. Screening  You may have the following tests or measurements:  Height, weight, and BMI.  Blood pressure.  Lipid and cholesterol levels. These may be checked every 5 years, or more frequently if you are over 14 years old.  Skin check.  Lung cancer screening. You may have this screening every year starting at age 2 if you have a 30-pack-year history of smoking and currently smoke or have quit within the past 15 years.  Fecal occult blood test (FOBT) of the stool. You may have this test every year starting at age 39.  Flexible sigmoidoscopy or colonoscopy. You may have a sigmoidoscopy every 5 years or a colonoscopy every 10 years starting at age 63.  Prostate cancer screening. Recommendations will vary depending on your family history and other risks.  Hepatitis C blood test.  Hepatitis B blood test.  Sexually transmitted disease (STD) testing.  Diabetes screening. This is done by checking your blood sugar (glucose) after you have not eaten for a while (fasting). You may have this done every 1-3 years.  Abdominal aortic aneurysm (AAA) screening. You may need this if you are a current  or former smoker.  Osteoporosis. You may be screened starting at age 28 if you are at high risk. Talk with your health care provider about your test results, treatment options, and if necessary, the need for more tests. Vaccines  Your health care provider may recommend  certain vaccines, such as:  Influenza vaccine. This is recommended every year.  Tetanus, diphtheria, and acellular pertussis (Tdap, Td) vaccine. You may need a Td booster every 10 years.  Zoster vaccine. You may need this after age 58.  Pneumococcal 13-valent conjugate (PCV13) vaccine. One dose is recommended after age 33.  Pneumococcal polysaccharide (PPSV23) vaccine. One dose is recommended after age 85. Talk to your health care provider about which screenings and vaccines you need and how often you need them. This information is not intended to replace advice given to you by your health care provider. Make sure you discuss any questions you have with your health care provider. Document Released: 10/10/2015 Document Revised: 06/02/2016 Document Reviewed: 07/15/2015 Elsevier Interactive Patient Education  2017 [place] Prevention in the Home Falls can cause injuries. They can happen to people of all ages. There are many things you can do to make your home safe and to help prevent falls. What can I do on the outside of my home?  Regularly fix the edges of walkways and driveways and fix any cracks.  Remove anything that might make you trip as you walk through a door, such as a raised step or threshold.  Trim any bushes or trees on the path to your home.  Use bright outdoor lighting.  Clear any walking paths of anything that might make someone trip, such as rocks or tools.  Regularly check to see if handrails are loose or broken. Make sure that both sides of any steps have handrails.  Any raised decks and porches should have guardrails on the edges.  Have any leaves, snow, or ice cleared regularly.  Use sand or salt on walking paths during winter.  Clean up any spills in your garage right away. This includes oil or grease spills. What can I do in the bathroom?  Use night lights.  Install grab bars by the toilet and in the tub and shower. Do not use towel bars as  grab bars.  Use non-skid mats or decals in the tub or shower.  If you need to sit down in the shower, use a plastic, non-slip stool.  Keep the floor dry. Clean up any water that spills on the floor as soon as it happens.  Remove soap buildup in the tub or shower regularly.  Attach bath mats securely with double-sided non-slip rug tape.  Do not have throw rugs and other things on the floor that can make you trip. What can I do in the bedroom?  Use night lights.  Make sure that you have a light by your bed that is easy to reach.  Do not use any sheets or blankets that are too big for your bed. They should not hang down onto the floor.  Have a firm chair that has side arms. You can use this for support while you get dressed.  Do not have throw rugs and other things on the floor that can make you trip. What can I do in the kitchen?  Clean up any spills right away.  Avoid walking on wet floors.  Keep items that you use a lot in easy-to-reach places.  If you need to reach something above you,  use a strong step stool that has a grab bar.  Keep electrical cords out of the way.  Do not use floor polish or wax that makes floors slippery. If you must use wax, use non-skid floor wax.  Do not have throw rugs and other things on the floor that can make you trip. What can I do with my stairs?  Do not leave any items on the stairs.  Make sure that there are handrails on both sides of the stairs and use them. Fix handrails that are broken or loose. Make sure that handrails are as long as the stairways.  Check any carpeting to make sure that it is firmly attached to the stairs. Fix any carpet that is loose or worn.  Avoid having throw rugs at the top or bottom of the stairs. If you do have throw rugs, attach them to the floor with carpet tape.  Make sure that you have a light switch at the top of the stairs and the bottom of the stairs. If you do not have them, ask someone to add them  for you. What else can I do to help prevent falls?  Wear shoes that:  Do not have high heels.  Have rubber bottoms.  Are comfortable and fit you well.  Are closed at the toe. Do not wear sandals.  If you use a stepladder:  Make sure that it is fully opened. Do not climb a closed stepladder.  Make sure that both sides of the stepladder are locked into place.  Ask someone to hold it for you, if possible.  Clearly mark and make sure that you can see:  Any grab bars or handrails.  First and last steps.  Where the edge of each step is.  Use tools that help you move around (mobility aids) if they are needed. These include:  Canes.  Walkers.  Scooters.  Crutches.  Turn on the lights when you go into a dark area. Replace any light bulbs as soon as they burn out.  Set up your furniture so you have a clear path. Avoid moving your furniture around.  If any of your floors are uneven, fix them.  If there are any pets around you, be aware of where they are.  Review your medicines with your doctor. Some medicines can make you feel dizzy. This can increase your chance of falling. Ask your doctor what other things that you can do to help prevent falls. This information is not intended to replace advice given to you by your health care provider. Make sure you discuss any questions you have with your health care provider. Document Released: 07/10/2009 Document Revised: 02/19/2016 Document Reviewed: 10/18/2014 Elsevier Interactive Patient Education  2017 Elsevier Inc.  Managing Pain Without Opioids Opioids are strong medicines used to treat moderate to severe pain. For some people, especially those who have long-term (chronic) pain, opioids may not be the best choice for pain management due to:  Side effects like nausea, constipation, and sleepiness.  The risk of addiction (opioid use disorder). The longer you take opioids, the greater your risk of addiction. Pain that lasts  for more than 3 months is called chronic pain. Managing chronic pain usually requires more than one approach and is often provided by a team of health care providers working together (multidisciplinary approach). Pain management may be done at a pain management center or pain clinic. Types of pain management without opioids Managing pain without opioids can involve:  Non-opioid medicines.  Exercises to help relieve pain and improve strength and range of motion (physical therapy).  Therapy to help with everyday tasks and activities (occupational therapy).  Therapy to help you find ways to relieve pain by doing things you enjoy (recreational therapy).  Talk therapy (psychotherapy) and other mental health therapies.  Medical treatments such as injections or devices.  Making lifestyle changes. Pain management options Non-opioid medicines Non-opioid medicines for pain may include medicines taken by mouth (oral medicines), such as:  Over-the-counter or prescription NSAIDs. These may be the first medicines used for pain. They work well for muscle and bone pain, and they reduce swelling.  Acetaminophen. This over-the-counter medicine may work well for milder pain but not swelling.  Antidepressants. These may be used to treat chronic pain. A certain type of antidepressant (tricyclics) is often used. These medicines are given in lower doses for pain than when used for depression.  Anticonvulsants. These are usually used to treat seizures but may also reduce nerve (neuropathic) pain.  Muscle relaxants. These relieve pain caused by sudden muscle tightening (spasms). You may also use a type of pain medicine that is applied to the skin as a patch, cream, or gel (topical analgesic), such as a numbing medicine. These may cause fewer side effects than oral medicines. Therapy Physical therapy involves doing exercises to gain strength and flexibility. A physical therapist may teach you exercises to move  and stretch parts of your body that are weak, stiff, or painful. You can learn these exercises at physical therapy visits and practice them at home. Physical therapy may also involve:  Massage.  Heat wraps or applying heat or cold to affected areas.  Sending electrical signals through the skin to interrupt pain signals (transcutaneous electrical nerve stimulation, TENS).  Sending weak lasers through the skin to reduce pain and swelling (low-level laser therapy).  Using signals from your body to help you learn to regulate pain (biofeedback). Occupational therapy helps you learn ways to function at home and work with less pain. Recreational therapy may involve trying new activities or hobbies, such as drawing or a physical activity. Types of mental health therapy for pain include:  Cognitive behavioral therapy (CBT) to help you learn coping skills for dealing with pain.  Acceptance and commitment therapy (ACT) to change the way you think and react to pain.  Relaxation therapies, including muscle relaxation exercises and focusing your mind on the present moment to lower stress (mindfulness-based stress reduction).  Pain management counseling. This may be individual, family, or group counseling.   Medical treatments Medical treatments for pain management include:  Nerve block injections. These may include a pain blocker and anti-inflammatory medicines. You may have injections: ? Near the spine to relieve chronic back or neck pain. ? Into joints to relieve back or joint pain. ? Into nerve areas that supply a painful area to relieve body pain. ? Into muscles (trigger point injections) to relieve some painful muscle conditions.  A medical device placed near your spine to help block pain signals and relieve nerve pain or chronic back pain (spinal cord stimulation device).  Acupuncture. Follow these instructions at home Medicines  Take over-the-counter and prescription medicines only as  told by your health care provider.  If you are taking pain medicine, ask your health care providers about possible side effects to watch out for.  Do not drive or use heavy machinery while taking prescription pain medicine. Lifestyle  Do not use drugs or alcohol to reduce pain. Limit alcohol intake  to no more than 1 drink a day for nonpregnant women and 2 drinks a day for men. One drink equals 12 oz of beer, 5 oz of wine, or 1 oz of hard liquor.  Do not use any products that contain nicotine or tobacco, such as cigarettes and e-cigarettes. These can delay healing. If you need help quitting, ask your health care provider.  Eat a healthy diet and maintain a healthy weight. Poor diet and excess weight may make pain worse. ? Eat foods that are high in fiber. These include fresh fruits and vegetables, whole grains, and beans. ? Limit foods that are high in fat and processed sugars, such as fried and sweet foods.  Exercise regularly. Exercise lowers stress and may help relieve pain. ? Ask your health care provider what activities and exercises are safe for you. ? If your health care provider approves, join an exercise class that combines movement and stress reduction. Examples include yoga and tai chi.  Get enough sleep. Lack of sleep may make pain worse.  Lower stress as much as possible. Practice stress reduction techniques as told by your therapist.   General instructions  Work with all your pain management providers to find the treatments that work best for you. You are an important member of your pain management team. There are many things you can do to reduce pain on your own.  Consider joining an online or in-person support group for people who have chronic pain.  Keep all follow-up visits as told by your health care providers. This is important. Where to find more information You can find more information about managing pain without opioids from:  American Academy of Pain Medicine:  painmed.[place] for Chronic Pain: instituteforchronicpain.org  American Chronic Pain Association: theacpa.org Contact a health care provider if:  You have side effects from pain medicine.  Your pain gets worse or does not get better with treatments or home care.  You are struggling with anxiety or depression. Summary  Many types of pain can be managed without opioids. Chronic pain may respond better to pain management without opioids.  Pain is best managed with a team of providers working together.  Pain management without opioids may include non-opioid medicines, medical treatments, physical therapy, mental health therapy, and lifestyle changes.  Tell your health care providers if your pain gets worse or is not being managed well enough. This information is not intended to replace advice given to you by your health care provider. Make sure you discuss any questions you have with your health care provider. Document Revised: 06/26/2020 Document Reviewed: 06/26/2020 Elsevier Patient Education  [place].

## 2021-02-17 ENCOUNTER — Encounter: Payer: Self-pay | Admitting: Family Medicine

## 2021-03-16 DIAGNOSIS — Z85828 Personal history of other malignant neoplasm of skin: Secondary | ICD-10-CM | POA: Diagnosis not present

## 2021-03-16 DIAGNOSIS — D2372 Other benign neoplasm of skin of left lower limb, including hip: Secondary | ICD-10-CM | POA: Diagnosis not present

## 2021-03-16 DIAGNOSIS — L579 Skin changes due to chronic exposure to nonionizing radiation, unspecified: Secondary | ICD-10-CM | POA: Diagnosis not present

## 2021-03-16 DIAGNOSIS — L814 Other melanin hyperpigmentation: Secondary | ICD-10-CM | POA: Diagnosis not present

## 2021-03-16 DIAGNOSIS — L821 Other seborrheic keratosis: Secondary | ICD-10-CM | POA: Diagnosis not present

## 2021-03-27 ENCOUNTER — Encounter: Payer: Self-pay | Admitting: Family Medicine

## 2021-04-15 ENCOUNTER — Other Ambulatory Visit: Payer: Self-pay | Admitting: Family Medicine

## 2021-04-16 ENCOUNTER — Ambulatory Visit (INDEPENDENT_AMBULATORY_CARE_PROVIDER_SITE_OTHER): Payer: Medicare Other | Admitting: Family Medicine

## 2021-04-16 ENCOUNTER — Encounter: Payer: Self-pay | Admitting: Family Medicine

## 2021-04-16 ENCOUNTER — Other Ambulatory Visit: Payer: Self-pay

## 2021-04-16 VITALS — BP 132/80 | HR 67 | Temp 97.9°F | Resp 20 | Ht 72.0 in | Wt 183.0 lb

## 2021-04-16 DIAGNOSIS — E034 Atrophy of thyroid (acquired): Secondary | ICD-10-CM

## 2021-04-16 DIAGNOSIS — I10 Essential (primary) hypertension: Secondary | ICD-10-CM | POA: Diagnosis not present

## 2021-04-16 DIAGNOSIS — K219 Gastro-esophageal reflux disease without esophagitis: Secondary | ICD-10-CM | POA: Diagnosis not present

## 2021-04-16 DIAGNOSIS — N1831 Chronic kidney disease, stage 3a: Secondary | ICD-10-CM

## 2021-04-16 DIAGNOSIS — M546 Pain in thoracic spine: Secondary | ICD-10-CM | POA: Diagnosis not present

## 2021-04-16 DIAGNOSIS — G8929 Other chronic pain: Secondary | ICD-10-CM | POA: Diagnosis not present

## 2021-04-16 MED ORDER — VENLAFAXINE HCL ER 75 MG PO CP24: ORAL_CAPSULE | ORAL | 3 refills | Status: DC

## 2021-04-16 MED ORDER — AMLODIPINE BESYLATE 10 MG PO TABS: 10.0000 mg | ORAL_TABLET | Freq: Every day | ORAL | 3 refills | Status: DC

## 2021-04-16 MED ORDER — OMEPRAZOLE 20 MG PO CPDR: DELAYED_RELEASE_CAPSULE | ORAL | 1 refills | Status: DC

## 2021-04-16 MED ORDER — BACLOFEN 10 MG PO TABS: 10.0000 mg | ORAL_TABLET | Freq: Two times a day (BID) | ORAL | 1 refills | Status: DC

## 2021-04-16 MED ORDER — LOSARTAN POTASSIUM 50 MG PO TABS: 50.0000 mg | ORAL_TABLET | Freq: Every day | ORAL | 3 refills | Status: DC

## 2021-04-16 NOTE — Progress Notes (Signed)
Subjective: IH:[id number] PCP: Janora Norlander, DO PPI:RJJOAC Jacob Kerr is a 70 y.o. male presenting to clinic today for:  1. Hypothyroidism Patient reports compliance with Synthroid.  Denies any tremor, heart palpitation, change in bowel habit (he has alternating chronic constipation and diarrhea at baseline)  2.  Hypertension associated with CKD3a Compliant with Cozaar, Norvasc.  No reports of chest pain or shortness of breath.  3.  Chronic back pain Patient continues to struggle with chronic back pain.  He uses his baclofen as needed for this issue.  He has been trying to do some exercises where he is doing traction on his spine in efforts to improve symptoms.  He has amitriptyline but has not been using it regularly.  This apparently was started by one of his specialist for his back pain.  Has not had any issues with concomitant use of his Effexor and this medicine.   ROS: Per HPI  Allergies  Allergen Reactions   Cymbalta [Duloxetine Hcl] Other (See Comments)    Depression   Lyrica [Pregabalin] Other (See Comments)    Paranoid   Neurontin [Gabapentin] Other (See Comments)    nightmares   Robaxin [Methocarbamol] Other (See Comments)    Seeing "bright lights"   Sudafed [Pseudoephedrine Hcl] Other (See Comments)    Increased heart rate   Epinephrine Palpitations    Decreased blood pressure   Past Medical History:  Diagnosis Date   Acute meniscal tear of left knee    Allergic rhinitis    Anal fissure    Anxiety    BPH (benign prostatic hypertrophy)    Cervical spondylosis without myelopathy    Cervicogenic headache    Chronic headaches    Chronic neck pain    uses TENS unit  prn   Diverticulitis    Gallstones    GERD (gastroesophageal reflux disease)    H/O hiatal hernia    History of diverticulitis    History of kidney stones    History of TIA (transient ischemic attack)    04/ 2011---  no residuals   Hypertension    Hypothyroidism    Peripheral  neuropathy    Shingles    Syrinx of spinal cord (HCC)    C4 -- C7    Current Outpatient Medications:    amitriptyline (ELAVIL) 10 MG tablet, TAKE 1 TABLET BY MOUTH EVERYDAY AT BEDTIME (Patient not taking: No sig reported), Disp: 90 tablet, Rfl: 2   amLODipine (NORVASC) 10 MG tablet, Take 1 tablet (10 mg total) by mouth daily., Disp: 90 tablet, Rfl: 3   aspirin 81 MG tablet, Take 81 mg by mouth at bedtime., Disp: , Rfl:    azelastine (ASTELIN) 0.1 % nasal spray, PLACE 2 SPRAYS INTO BOTH NOSTRILS 2 (TWO) TIMES DAILY., Disp: 30 mL, Rfl: 5   baclofen (LIORESAL) 10 MG tablet, TAKE 1 TABLET BY MOUTH TWICE A DAY, Disp: 180 tablet, Rfl: 1   Coenzyme Q10 (CO Q 10) 100 MG CAPS, Take 1 capsule by mouth daily., Disp: , Rfl:    levothyroxine (SYNTHROID) 150 MCG tablet, TAKE 1 TABLET BY MOUTH  DAILY BEFORE BREAKFAST  EXCEPT ONE-HALF TABLET BY  MOUTH ON SUNDAY, Disp: 84 tablet, Rfl: 3   LORazepam (ATIVAN) 0.5 MG tablet, Take 1 tablet (0.5 mg total) by mouth 2 (two) times daily as needed for anxiety. (Patient not taking: Reported on 02/09/2021), Disp: 30 tablet, Rfl: 0   losartan (COZAAR) 50 MG tablet, Take 1 tablet (50 mg total) by mouth daily., Disp:  90 tablet, Rfl: 3   NON FORMULARY, Take 3 capsules by mouth every morning. Nugenix Testosterone Booster / Center, Disp: , Rfl:    omeprazole (PRILOSEC) 20 MG capsule, TAKE 1 CAPSULE BY MOUTH  TWICE DAILY, Disp: 180 capsule, Rfl: 1   venlafaxine XR (EFFEXOR-XR) 75 MG 24 hr capsule, TAKE 1 CAPSULE BY MOUTH  DAILY WITH BREAKFAST, Disp: 90 capsule, Rfl: 0   Vitamin D, Ergocalciferol, (DRISDOL) 1.25 MG (50000 UNIT) CAPS capsule, TAKE 1 CAPSULE (50,000 UNITS TOTAL) BY MOUTH 2 (TWO) TIMES A WEEK., Disp: 24 capsule, Rfl: 3 Social History   Socioeconomic History   Marital status: Married    Spouse name: [name]   Number of children: 2   Years of education: 12   Highest education level: 12th grade  Occupational History   Occupation: Retired    Fish farm manager: Chief of Staff   Tobacco Use   Smoking status: Never   Smokeless tobacco: Never  Vaping Use   Vaping Use: Never used  Substance and Sexual Activity   Alcohol use: Not Currently    Alcohol/week: 7.0 standard drinks    Types: 7 Shots of liquor per week    Comment: 2 at night   Drug use: No   Sexual activity: Not on file  Other Topics Concern   Not on file  Social History Narrative   Lives at home with wife.       Patient is right handed.   Patient drinks 1-2 cups of caffeine daily.   Social Determinants of Health   Financial Resource Strain: Low Risk    Difficulty of Paying Living Expenses: Not hard at all  Food Insecurity: No Food Insecurity   Worried About Charity fundraiser in the Last Year: Never true   [place] in the Last Year: Never true  Transportation Needs: No Transportation Needs   Lack of Transportation (Medical): No   Lack of Transportation (Non-Medical): No  Physical Activity: Sufficiently Active   Days of Exercise per Week: 7 days   Minutes of Exercise per Session: 40 min  Stress: No Stress Concern Present   Feeling of Stress : Not at all  Social Connections: Moderately Isolated   Frequency of Communication with Friends and Family: More than three times a week   Frequency of Social Gatherings with Friends and Family: More than three times a week   Attends Religious Services: Never   Marine scientist or Organizations: No   Attends Music therapist: Never   Marital Status: Married  Human resources officer Violence: Not At Risk   Fear of Current or Ex-Partner: No   Emotionally Abused: No   Physically Abused: No   Sexually Abused: No   Family History  Problem Relation Age of Onset   Prostate cancer Father    Heart disease Father    Stroke Father    Nephrolithiasis Sister    Cancer Sister        Intestinal   Hyperlipidemia Sister    Hyperlipidemia Sister    Cancer Sister        Sinus   COPD Other    Colon polyps Brother        x2   Colon  polyps Sister    Colon cancer Neg Hx    Esophageal cancer Neg Hx     Objective: Office vital signs reviewed. BP 132/80   Pulse 67   Temp 97.9 F (36.6 C)   Resp 20   Ht 6' (1.829  m)   Wt 183 lb (83 kg)   SpO2 96%   BMI 24.82 kg/m   Physical Examination:  General: Awake, alert, well nourished, No acute distress HEENT: Normal; prominent cricoid but no appreciable thyroid masses, cervical lymphadenopathy Cardio: regular rate and rhythm, S1S2 heard, no murmurs appreciated Pulm: clear to auscultation bilaterally, no wheezes, rhonchi or rales; normal work of breathing on room air Extremities: warm, well perfused, No edema, cyanosis or clubbing; +2 pulses bilaterally MSK: normal gait and station; frequently changes position while seated however to find a position of comfort. Skin: dry; intact; no rashes or lesions; normal temperature Neuro: no tremor  Assessment/ Plan: 70 y.o. male   Hypothyroidism due to acquired atrophy of thyroid - Plan: TSH, T4, Free  Essential hypertension - Plan: amLODipine (NORVASC) 10 MG tablet, losartan (COZAAR) 50 MG tablet  Stage 3a chronic kidney disease (HCC) - Plan: Renal Function Panel, losartan (COZAAR) 50 MG tablet  Chronic right-sided thoracic back pain  Gastroesophageal reflux disease without esophagitis - Plan: omeprazole (PRILOSEC) 20 MG capsule  Asymptomatic from a thyroid standpoint.  Check TSH and free T4  Blood pressure well controlled.  Has had waxing and waning renal function between stage II and stage III chronic kidney disease.  Check renal function panel.  Not quite due for fasting lipid panel  Chronic back pain is stable.  Not having any concerning symptoms with concomitant use of TCA and SNRI  GERD was not discussed but omeprazole was renewed given that it is due next month  No orders of the defined types were placed in this encounter.  No orders of the defined types were placed in this encounter.    Janora Norlander,  DO [place] [phone]

## 2021-04-17 LAB — RENAL FUNCTION PANEL
Albumin: 4.6 g/dL (ref 3.8–4.8)
BUN/Creatinine Ratio: 11 (ref 10–24)
BUN: 13 mg/dL (ref 8–27)
CO2: 25 mmol/L (ref 20–29)
Calcium: 9.4 mg/dL (ref 8.6–10.2)
Chloride: 102 mmol/L (ref 96–106)
Creatinine, Ser: 1.2 mg/dL (ref 0.76–1.27)
Glucose: 110 mg/dL — ABNORMAL HIGH (ref 65–99)
Phosphorus: 2.8 mg/dL (ref 2.8–4.1)
Potassium: 4.2 mmol/L (ref 3.5–5.2)
Sodium: 141 mmol/L (ref 134–144)
eGFR: 65 mL/min/{1.73_m2} (ref >59)

## 2021-04-17 LAB — T4, FREE: Free T4: 1.68 ng/dL (ref 0.82–1.77)

## 2021-04-17 LAB — TSH: TSH: 0.186 u[IU]/mL — ABNORMAL LOW (ref 0.450–4.500)

## 2021-06-20 ENCOUNTER — Other Ambulatory Visit: Payer: Self-pay | Admitting: Family Medicine

## 2021-09-15 ENCOUNTER — Encounter: Payer: Self-pay | Admitting: Family Medicine

## 2021-09-24 DIAGNOSIS — Z85828 Personal history of other malignant neoplasm of skin: Secondary | ICD-10-CM | POA: Diagnosis not present

## 2021-09-24 DIAGNOSIS — L821 Other seborrheic keratosis: Secondary | ICD-10-CM | POA: Diagnosis not present

## 2021-09-24 DIAGNOSIS — L579 Skin changes due to chronic exposure to nonionizing radiation, unspecified: Secondary | ICD-10-CM | POA: Diagnosis not present

## 2021-09-24 DIAGNOSIS — L814 Other melanin hyperpigmentation: Secondary | ICD-10-CM | POA: Diagnosis not present

## 2021-10-13 DIAGNOSIS — M25511 Pain in right shoulder: Secondary | ICD-10-CM | POA: Diagnosis not present

## 2021-10-13 DIAGNOSIS — M542 Cervicalgia: Secondary | ICD-10-CM | POA: Diagnosis not present

## 2021-10-14 ENCOUNTER — Other Ambulatory Visit: Payer: Self-pay | Admitting: Family Medicine

## 2021-10-14 DIAGNOSIS — K219 Gastro-esophageal reflux disease without esophagitis: Secondary | ICD-10-CM

## 2021-10-14 NOTE — Telephone Encounter (Signed)
[facility]. NTBS for 6 mos ckup. Last OV July. Mail order sent

## 2021-10-14 NOTE — Telephone Encounter (Signed)
Made appt for first availabe with dr g in feb

## 2021-10-20 ENCOUNTER — Other Ambulatory Visit: Payer: Self-pay | Admitting: Family Medicine

## 2021-10-20 DIAGNOSIS — E034 Atrophy of thyroid (acquired): Secondary | ICD-10-CM

## 2021-10-23 DIAGNOSIS — M25511 Pain in right shoulder: Secondary | ICD-10-CM | POA: Diagnosis not present

## 2021-10-29 ENCOUNTER — Other Ambulatory Visit: Payer: Self-pay | Admitting: Hematology

## 2021-10-30 DIAGNOSIS — M25511 Pain in right shoulder: Secondary | ICD-10-CM | POA: Diagnosis not present

## 2021-11-18 ENCOUNTER — Encounter: Payer: Self-pay | Admitting: Family Medicine

## 2021-11-19 ENCOUNTER — Telehealth: Payer: Medicare Other | Admitting: Nurse Practitioner

## 2021-11-19 DIAGNOSIS — H5789 Other specified disorders of eye and adnexa: Secondary | ICD-10-CM

## 2021-11-19 MED ORDER — POLYMYXIN B-TRIMETHOPRIM 10000-0.1 UNIT/ML-% OP SOLN: 1.0000 [drp] | OPHTHALMIC | 0 refills | Status: DC

## 2021-11-19 NOTE — Progress Notes (Signed)

## 2021-11-23 ENCOUNTER — Ambulatory Visit (INDEPENDENT_AMBULATORY_CARE_PROVIDER_SITE_OTHER): Payer: Medicare Other | Admitting: Family Medicine

## 2021-11-23 ENCOUNTER — Encounter: Payer: Self-pay | Admitting: Family Medicine

## 2021-11-23 VITALS — BP 130/81 | HR 75 | Temp 97.8°F | Ht 72.0 in | Wt 185.4 lb

## 2021-11-23 DIAGNOSIS — N1831 Chronic kidney disease, stage 3a: Secondary | ICD-10-CM

## 2021-11-23 DIAGNOSIS — M47812 Spondylosis without myelopathy or radiculopathy, cervical region: Secondary | ICD-10-CM | POA: Diagnosis not present

## 2021-11-23 DIAGNOSIS — E7800 Pure hypercholesterolemia, unspecified: Secondary | ICD-10-CM

## 2021-11-23 DIAGNOSIS — R39198 Other difficulties with micturition: Secondary | ICD-10-CM

## 2021-11-23 DIAGNOSIS — I10 Essential (primary) hypertension: Secondary | ICD-10-CM | POA: Diagnosis not present

## 2021-11-23 DIAGNOSIS — E034 Atrophy of thyroid (acquired): Secondary | ICD-10-CM | POA: Diagnosis not present

## 2021-11-23 MED ORDER — BACLOFEN 10 MG PO TABS: 10.0000 mg | ORAL_TABLET | Freq: Three times a day (TID) | ORAL | 1 refills | Status: DC | PRN

## 2021-11-23 NOTE — Progress Notes (Signed)
Subjective: CC: Follow-up hypothyroidism PCP: Janora Norlander, DO Jacob Kerr is a 71 y.o. male presenting to clinic today for:  1.  Hypothyroidism Patient is compliant with his Synthroid.  He only takes one half on Sundays but takes 1 tablet daily all other days.  He will come in for fasting labs tomorrow.  2.  Chronic back pain Patient with ongoing back pain, specifically on the right side of the back.  He is managed by Dr. Maxie Better but will be seeing a neurologist in a couple of weeks for evaluation.  3.  Hypertension with hyperlipidemia Patient reports compliance with his medications.  He did have a transient rise in his blood pressure a couple of days ago.  He had a headache at that time.  He has history of TIA.  Symptoms have totally resolved.  4.  Change in urination Patient reports he has had a change in his urinary flow.  No hematuria, dysuria reported.   ROS: Per HPI  Allergies  Allergen Reactions   Cymbalta [Duloxetine Hcl] Other (See Comments)    Depression   Lyrica [Pregabalin] Other (See Comments)    Paranoid   Neurontin [Gabapentin] Other (See Comments)    nightmares   Robaxin [Methocarbamol] Other (See Comments)    Seeing "bright lights"   Sudafed [Pseudoephedrine Hcl] Other (See Comments)    Increased heart rate   Epinephrine Palpitations    Decreased blood pressure   Past Medical History:  Diagnosis Date   Acute meniscal tear of left knee    Allergic rhinitis    Anal fissure    Anxiety    BPH (benign prostatic hypertrophy)    Cervical spondylosis without myelopathy    Cervicogenic headache    Chronic headaches    Chronic neck pain    uses TENS unit  prn   Diverticulitis    Gallstones    GERD (gastroesophageal reflux disease)    H/O hiatal hernia    History of diverticulitis    History of kidney stones    History of TIA (transient ischemic attack)    04/ 2011---  no residuals   Hypertension    Hypothyroidism    Peripheral  neuropathy    Shingles    Syrinx of spinal cord (HCC)    C4 -- C7    Current Outpatient Medications:    amLODipine (NORVASC) 10 MG tablet, Take 1 tablet (10 mg total) by mouth daily., Disp: 90 tablet, Rfl: 3   aspirin 81 MG tablet, Take 81 mg by mouth at bedtime., Disp: , Rfl:    baclofen (LIORESAL) 10 MG tablet, Take 1 tablet (10 mg total) by mouth 2 (two) times daily., Disp: 180 tablet, Rfl: 1   Coenzyme Q10 (CO Q 10) 100 MG CAPS, Take 1 capsule by mouth daily., Disp: , Rfl:    levothyroxine (SYNTHROID) 150 MCG tablet, TAKE 1 TABLET BY MOUTH  DAILY BEFORE BREAKFAST  EXCEPT ONE-HALF TABLET BY  MOUTH ON SUNDAY, Disp: 81 tablet, Rfl: 1   LORazepam (ATIVAN) 0.5 MG tablet, Take 1 tablet (0.5 mg total) by mouth 2 (two) times daily as needed for anxiety., Disp: 30 tablet, Rfl: 0   losartan (COZAAR) 50 MG tablet, Take 1 tablet (50 mg total) by mouth daily., Disp: 90 tablet, Rfl: 3   NON FORMULARY, Take 3 capsules by mouth every morning. Nugenix Testosterone Booster / Cascade, Disp: , Rfl:    omeprazole (PRILOSEC) 20 MG capsule, TAKE 1 CAPSULE BY MOUTH  TWICE DAILY, Disp:  180 capsule, Rfl: 0   trimethoprim-polymyxin b (POLYTRIM) ophthalmic solution, Place 1 drop into the left eye every 4 (four) hours., Disp: 10 mL, Rfl: 0   venlafaxine XR (EFFEXOR-XR) 75 MG 24 hr capsule, TAKE 1 CAPSULE BY MOUTH  DAILY WITH BREAKFAST, Disp: 90 capsule, Rfl: 3   Vitamin D, Ergocalciferol, (DRISDOL) 1.25 MG (50000 UNIT) CAPS capsule, TAKE 1 CAPSULE (50,000 UNITS TOTAL) BY MOUTH TWO TIMES A WEEK, Disp: 24 capsule, Rfl: 3   zinc gluconate 50 MG tablet, Take 50 mg by mouth daily., Disp: , Rfl:    amitriptyline (ELAVIL) 10 MG tablet, TAKE 1 TABLET BY MOUTH EVERYDAY AT BEDTIME (Patient not taking: Reported on 12/09/2020), Disp: 90 tablet, Rfl: 2   azelastine (ASTELIN) 0.1 % nasal spray, PLACE 2 SPRAYS INTO BOTH NOSTRILS 2 (TWO) TIMES DAILY. (Patient not taking: Reported on 04/16/2021), Disp: 30 mL, Rfl: 5 Social History    Socioeconomic History   Marital status: Married    Spouse name: [name]   Number of children: 2   Years of education: 12   Highest education level: 12th grade  Occupational History   Occupation: Retired    Fish farm manager: Chief of Staff  Tobacco Use   Smoking status: Never   Smokeless tobacco: Never  Vaping Use   Vaping Use: Never used  Substance and Sexual Activity   Alcohol use: Not Currently    Alcohol/week: 7.0 standard drinks    Types: 7 Shots of liquor per week    Comment: 2 at night   Drug use: No   Sexual activity: Not on file  Other Topics Concern   Not on file  Social History Narrative   Lives at home with wife.       Patient is right handed.   Patient drinks 1-2 cups of caffeine daily.   Social Determinants of Health   Financial Resource Strain: Low Risk    Difficulty of Paying Living Expenses: Not hard at all  Food Insecurity: No Food Insecurity   Worried About Charity fundraiser in the Last Year: Never true   [place] in the Last Year: Never true  Transportation Needs: No Transportation Needs   Lack of Transportation (Medical): No   Lack of Transportation (Non-Medical): No  Physical Activity: Sufficiently Active   Days of Exercise per Week: 7 days   Minutes of Exercise per Session: 40 min  Stress: No Stress Concern Present   Feeling of Stress : Not at all  Social Connections: Moderately Isolated   Frequency of Communication with Friends and Family: More than three times a week   Frequency of Social Gatherings with Friends and Family: More than three times a week   Attends Religious Services: Never   Marine scientist or Organizations: No   Attends Music therapist: Never   Marital Status: Married  Human resources officer Violence: Not At Risk   Fear of Current or Ex-Partner: No   Emotionally Abused: No   Physically Abused: No   Sexually Abused: No   Family History  Problem Relation Age of Onset   Prostate cancer Father    Heart  disease Father    Stroke Father    Nephrolithiasis Sister    Cancer Sister        Intestinal   Hyperlipidemia Sister    Hyperlipidemia Sister    Cancer Sister        Sinus   COPD Other    Colon polyps Brother  x2   Colon polyps Sister    Colon cancer Neg Hx    Esophageal cancer Neg Hx     Objective: Office vital signs reviewed. BP 130/81    Pulse 75    Temp 97.8 F (36.6 C)    Ht 6' (1.829 m)    Wt 185 lb 6.4 oz (84.1 kg)    SpO2 97%    BMI 25.14 kg/m   Physical Examination:  General: Awake, alert, nontoxic-appearing male, No acute distress HEENT: Sclera white.  Moist mucous membranes Cardio: regular rate and rhythm, S1S2 heard, no murmurs appreciated Pulm: clear to auscultation bilaterally, no wheezes, rhonchi or rales; normal work of breathing on room air MSK: Slow, antalgic gait  Assessment/ Plan: 71 y.o. male   Hypothyroidism due to acquired atrophy of thyroid - Plan: T4, free  Essential hypertension - Plan: CMP14+EGFR  Stage 3a chronic kidney disease ([place]) - Plan: CMP14+EGFR, VITAMIN D 25 Hydroxy (Vit-D Deficiency, Fractures), CBC  Pure hypercholesterolemia - Plan: Lipid panel  Subjective change in urination - Plan: PSA  Cervical spondylosis without myelopathy - Plan: baclofen (LIORESAL) 10 MG tablet  Asymptomatic from a thyroid standpoint.  He will come in for fasting labs tomorrow continue current regimen  Blood pressure is controlled today but he is reported some transient changes in blood pressure.  Need to continue monitoring very closely given history of TIA  Check renal function tomorrow, vitamin D and CBC given history of CKD 3A  Fasting lipid panel ordered for tomorrow  Reports some change in urination.  We will plan for PSA check.  Could consider use of Flomax or similar if needed  Advance baclofen to 3 times daily.  Can continue twice daily dosing but I would like him to have a reserve third tablet should he need it.  Caution sedation.   Continue to follow-up with spinal surgery as directed  No orders of the defined types were placed in this encounter.  No orders of the defined types were placed in this encounter.    Janora Norlander, DO [place] [phone]

## 2021-11-24 ENCOUNTER — Other Ambulatory Visit: Payer: Medicare Other

## 2021-11-24 DIAGNOSIS — N1831 Chronic kidney disease, stage 3a: Secondary | ICD-10-CM | POA: Diagnosis not present

## 2021-11-24 DIAGNOSIS — E034 Atrophy of thyroid (acquired): Secondary | ICD-10-CM | POA: Diagnosis not present

## 2021-11-24 DIAGNOSIS — E7800 Pure hypercholesterolemia, unspecified: Secondary | ICD-10-CM | POA: Diagnosis not present

## 2021-11-24 DIAGNOSIS — R39198 Other difficulties with micturition: Secondary | ICD-10-CM

## 2021-11-24 DIAGNOSIS — I10 Essential (primary) hypertension: Secondary | ICD-10-CM

## 2021-11-25 ENCOUNTER — Encounter: Payer: Self-pay | Admitting: Family Medicine

## 2021-11-25 ENCOUNTER — Other Ambulatory Visit: Payer: Self-pay | Admitting: Family Medicine

## 2021-11-25 DIAGNOSIS — E7800 Pure hypercholesterolemia, unspecified: Secondary | ICD-10-CM

## 2021-11-25 LAB — CBC
Hematocrit: 51.1 % — ABNORMAL HIGH (ref 37.5–51.0)
Hemoglobin: 17.6 g/dL (ref 13.0–17.7)
MCH: 33.5 pg — ABNORMAL HIGH (ref 26.6–33.0)
MCHC: 34.4 g/dL (ref 31.5–35.7)
MCV: 97 fL (ref 79–97)
Platelets: 221 10*3/uL (ref 150–450)
RBC: 5.26 x10E6/uL (ref 4.14–5.80)
RDW: 12.9 % (ref 11.6–15.4)
WBC: 5.2 10*3/uL (ref 3.4–10.8)

## 2021-11-25 LAB — CMP14+EGFR
ALT: 21 IU/L (ref 0–44)
AST: 17 IU/L (ref 0–40)
Albumin/Globulin Ratio: 2 (ref 1.2–2.2)
Albumin: 4.5 g/dL (ref 3.8–4.8)
Alkaline Phosphatase: 120 IU/L (ref 44–121)
BUN/Creatinine Ratio: 10 (ref 10–24)
BUN: 12 mg/dL (ref 8–27)
Bilirubin Total: 1.1 mg/dL (ref 0.0–1.2)
CO2: 27 mmol/L (ref 20–29)
Calcium: 9.5 mg/dL (ref 8.6–10.2)
Chloride: 99 mmol/L (ref 96–106)
Creatinine, Ser: 1.19 mg/dL (ref 0.76–1.27)
Globulin, Total: 2.2 g/dL (ref 1.5–4.5)
Glucose: 118 mg/dL — ABNORMAL HIGH (ref 70–99)
Potassium: 4.5 mmol/L (ref 3.5–5.2)
Sodium: 138 mmol/L (ref 134–144)
Total Protein: 6.7 g/dL (ref 6.0–8.5)
eGFR: 66 mL/min/{1.73_m2} (ref >59)

## 2021-11-25 LAB — LIPID PANEL
Chol/HDL Ratio: 3.6 ratio (ref 0.0–5.0)
Cholesterol, Total: 188 mg/dL (ref 100–199)
HDL: 52 mg/dL (ref >39)
LDL Chol Calc (NIH): 107 mg/dL — ABNORMAL HIGH (ref 0–99)
Triglycerides: 168 mg/dL — ABNORMAL HIGH (ref 0–149)
VLDL Cholesterol Cal: 29 mg/dL (ref 5–40)

## 2021-11-25 LAB — PSA: Prostate Specific Ag, Serum: 0.3 ng/mL (ref 0.0–4.0)

## 2021-11-25 LAB — T4, FREE: Free T4: 1.62 ng/dL (ref 0.82–1.77)

## 2021-11-25 LAB — VITAMIN D 25 HYDROXY (VIT D DEFICIENCY, FRACTURES): Vit D, 25-Hydroxy: 96.7 ng/mL (ref 30.0–100.0)

## 2021-11-25 MED ORDER — ROSUVASTATIN CALCIUM 10 MG PO TABS
10.0000 mg | ORAL_TABLET | Freq: Every day | ORAL | 3 refills | Status: DC
Start: 2021-11-25 — End: 2022-07-16

## 2021-12-02 ENCOUNTER — Encounter: Payer: Self-pay | Admitting: *Deleted

## 2021-12-07 ENCOUNTER — Ambulatory Visit: Payer: Medicare Other | Admitting: Diagnostic Neuroimaging

## 2021-12-07 ENCOUNTER — Other Ambulatory Visit: Payer: Self-pay

## 2021-12-07 ENCOUNTER — Encounter: Payer: Self-pay | Admitting: Diagnostic Neuroimaging

## 2021-12-07 VITALS — BP 145/85 | HR 72 | Ht 72.0 in | Wt 185.0 lb

## 2021-12-07 DIAGNOSIS — M79601 Pain in right arm: Secondary | ICD-10-CM | POA: Diagnosis not present

## 2021-12-07 DIAGNOSIS — M25511 Pain in right shoulder: Secondary | ICD-10-CM

## 2021-12-07 DIAGNOSIS — G8929 Other chronic pain: Secondary | ICD-10-CM | POA: Diagnosis not present

## 2021-12-07 DIAGNOSIS — M5489 Other dorsalgia: Secondary | ICD-10-CM

## 2021-12-07 NOTE — Patient Instructions (Signed)
?  RIGHT SHOULDER, ARM, FLANK PAIN (post-traumatic since 2015 fall) ?- not related to cervical and thoracic syrinx ?- patient not interested in surgery, stimulator or pain meds ?- consider PT, OT, personal trainer, chiro, acupuncture, sauna, massage treatments ?

## 2021-12-07 NOTE — Progress Notes (Signed)
[facility]  PATIENT: Jacob Kerr DOB: November 12, 1950  REFERRING CLINICIAN: Susa Day, MD HISTORY FROM: patient  REASON FOR VISIT: new consult    HISTORICAL  CHIEF COMPLAINT:  Chief Complaint  Patient presents with   New Patient (Initial Visit)    RM 6 alone here for consult on worsening neck pain-     HISTORY OF PRESENT ILLNESS:   UPDATE (12/07/21, VRP): 71 year old male here for evaluation of right-sided shoulder pain, arm pain, flank pain.  Symptoms started around 2015 when he fell down and injured himself.  He has had problems ever since that time.  In the course of work-up he was found to have cervical and thoracic syringomyelia, mild and asymptomatic incidental findings.  He is managed his symptoms over the years with pain management, nerve blocks, injections, exercise, PT, acupuncture and chiropractic treatments.  Patient went to orthopedic clinic recently for evaluation and was referred back here to evaluate cervical and thoracic syringomyelia.    PRIOR HPI (08/10/16, Dr. Jannifer Kerr): Jacob Kerr is a 71 year old right-handed white male with a history of chronic neck pain, the patient has a history of a cervical cord syrinx. He has been followed through a pain center, he is seen through [facility]. The patient has gained good improvement. He is also undergoing regular stretching exercises, he takes yoga with some benefit. The patient still has headaches but they are more of a pressure sensation in the head. The patient has had positional vertigo that has responded to the Epley maneuvers. Overall, the patient believes that he is doing better. He returns for an evaluation. He has gone off the Ultram completely.   REVIEW OF SYSTEMS: Full 14 system review of systems performed and negative with exception of: as per HPI.  ALLERGIES: Allergies  Allergen Reactions   Cymbalta [Duloxetine Hcl] Other (See Comments)    Depression   Lyrica [Pregabalin]  Other (See Comments)    Paranoid   Neurontin [Gabapentin] Other (See Comments)    nightmares   Robaxin [Methocarbamol] Other (See Comments)    Seeing "bright lights"   Sudafed [Pseudoephedrine Hcl] Other (See Comments)    Increased heart rate   Epinephrine Palpitations    Decreased blood pressure    HOME MEDICATIONS: Outpatient Medications Prior to Visit  Medication Sig Dispense Refill   amitriptyline (ELAVIL) 10 MG tablet TAKE 1 TABLET BY MOUTH EVERYDAY AT BEDTIME 90 tablet 2   amLODipine (NORVASC) 10 MG tablet Take 1 tablet (10 mg total) by mouth daily. 90 tablet 3   aspirin 81 MG tablet Take 81 mg by mouth at bedtime.     azelastine (ASTELIN) 0.1 % nasal spray PLACE 2 SPRAYS INTO BOTH NOSTRILS 2 (TWO) TIMES DAILY. 30 mL 5   baclofen (LIORESAL) 10 MG tablet Take 1 tablet (10 mg total) by mouth 3 (three) times daily as needed for muscle spasms. 270 tablet 1   Coenzyme Q10 (CO Q 10) 100 MG CAPS Take 1 capsule by mouth daily.     levothyroxine (SYNTHROID) 150 MCG tablet TAKE 1 TABLET BY MOUTH  DAILY BEFORE BREAKFAST  EXCEPT ONE-HALF TABLET BY  MOUTH ON SUNDAY 81 tablet 1   LORazepam (ATIVAN) 0.5 MG tablet Take 1 tablet (0.5 mg total) by mouth 2 (two) times daily as needed for anxiety. 30 tablet 0   losartan (COZAAR) 50 MG tablet Take 1 tablet (50 mg total) by mouth daily. 90 tablet 3   NON FORMULARY Take 3 capsules by mouth every morning.  Nugenix Testosterone Booster / [employer]     omeprazole (PRILOSEC) 20 MG capsule TAKE 1 CAPSULE BY MOUTH  TWICE DAILY 180 capsule 0   rosuvastatin (CRESTOR) 10 MG tablet Take 1 tablet (10 mg total) by mouth daily. 90 tablet 3   trimethoprim-polymyxin b (POLYTRIM) ophthalmic solution Place 1 drop into the left eye every 4 (four) hours. 10 mL 0   venlafaxine XR (EFFEXOR-XR) 75 MG 24 hr capsule TAKE 1 CAPSULE BY MOUTH  DAILY WITH BREAKFAST 90 capsule 3   Vitamin D, Ergocalciferol, (DRISDOL) 1.25 MG (50000 UNIT) CAPS capsule TAKE 1 CAPSULE (50,000 UNITS TOTAL) BY  MOUTH TWO TIMES A WEEK 24 capsule 3   zinc gluconate 50 MG tablet Take 50 mg by mouth daily.     No facility-administered medications prior to visit.    PAST MEDICAL HISTORY: Past Medical History:  Diagnosis Date   Acute meniscal tear of left knee    Allergic rhinitis    Anal fissure    Anxiety    BPH (benign prostatic hypertrophy)    Carpal tunnel syndrome of right wrist    Cervical spondylosis without myelopathy    Cervicogenic headache    Chronic headaches    Chronic neck pain    uses TENS unit  prn   Diverticulitis    Gallstones    GERD (gastroesophageal reflux disease)    H/O hiatal hernia    History of diverticulitis    History of kidney stones    History of TIA (transient ischemic attack)    04/ 2011---  no residuals   Hypertension    Hypothyroidism    Peripheral neuropathy    Shingles    Syrinx of spinal cord ([place])    C4 -- C7    PAST SURGICAL HISTORY: Past Surgical History:  Procedure Laterality Date   CARDIAC CATHETERIZATION  02-18-2010   dr Jacob Kerr   normal coronary arteries/  normal lvsf--  ef 65%   COLONOSCOPY     [place]  2018   [facility] [place] [facility]   KNEE ARTHROSCOPY Left 03/13/2014   Procedure: LEFT ARTHROSCOPY KNEE WITH DEBRIDMENT;  Surgeon: Jacob Alf, MD;  Location: [facility];  Service: Orthopedics;  Laterality: Left;   LAPAROSCOPIC CHOLECYSTECTOMY  01/30/2003   NEGATIVE SLEEP STUDY  2013   per pt   RADIOFREQUENCY ABLATION NERVES  05/2016   REMOVAL FORGEIN BODY FROM EAR  AGE 89   RIGHT URETEROSCOPIC LASER LITHOTRIPSY STONE EXTRACTION /  STENT PLACEMENT  05/10/2000   TRANSTHORACIC ECHOCARDIOGRAM  01/16/2010   normal lvf/  ef  60%/  mild lae    FAMILY HISTORY: Family History  Problem Relation Age of Onset   Heart disease Mother    Prostate cancer Father    Heart disease Father    Stroke Father    Nephrolithiasis Sister    Cancer Sister        Intestinal   Hyperlipidemia  Sister    Hyperlipidemia Sister    Cancer Sister        Sinus   Colon polyps Sister    Diabetes Brother    Colon polyps Brother        x2   COPD Other    Colon cancer Neg Hx    Esophageal cancer Neg Hx     SOCIAL HISTORY: Social History   Socioeconomic History   Marital status: Married    Spouse name: [name]   Number of children: 2  Years of education: 68   Highest education level: 12th grade  Occupational History   Occupation: Retired    Fish farm manager: Chief of Staff  Tobacco Use   Smoking status: Never   Smokeless tobacco: Never  Vaping Use   Vaping Use: Never used  Substance and Sexual Activity   Alcohol use: Not Currently    Alcohol/week: 7.0 standard drinks    Types: 7 Shots of liquor per week    Comment: 2 at night   Drug use: No   Sexual activity: Not on file  Other Topics Concern   Not on file  Social History Narrative   Lives at home with wife.       Patient is right handed.   Patient drinks 1-2 cups of caffeine daily.   Social Determinants of Health   Financial Resource Strain: Low Risk    Difficulty of Paying Living Expenses: Not hard at all  Food Insecurity: No Food Insecurity   Worried About Charity fundraiser in the Last Year: Never true   [place] in the Last Year: Never true  Transportation Needs: No Transportation Needs   Lack of Transportation (Medical): No   Lack of Transportation (Non-Medical): No  Physical Activity: Sufficiently Active   Days of Exercise per Week: 7 days   Minutes of Exercise per Session: 40 min  Stress: No Stress Concern Present   Feeling of Stress : Not at all  Social Connections: Moderately Isolated   Frequency of Communication with Friends and Family: More than three times a week   Frequency of Social Gatherings with Friends and Family: More than three times a week   Attends Religious Services: Never   Marine scientist or Organizations: No   Attends Music therapist: Never   Marital Status:  Married  Human resources officer Violence: Not At Risk   Fear of Current or Ex-Partner: No   Emotionally Abused: No   Physically Abused: No   Sexually Abused: No     PHYSICAL EXAM  GENERAL EXAM/CONSTITUTIONAL: Vitals:  Vitals:   12/07/21 1210  BP: (!) 145/85  Pulse: 72  Weight: 185 lb (83.9 kg)  Height: 6' (1.829 m)   Body mass index is 25.09 kg/m. Wt Readings from Last 3 Encounters:  12/07/21 185 lb (83.9 kg)  11/23/21 185 lb 6.4 oz (84.1 kg)  04/16/21 183 lb (83 kg)   Patient is in no distress; well developed, nourished and groomed; neck is supple  CARDIOVASCULAR: Examination of carotid arteries is normal; no carotid bruits Regular rate and rhythm, no murmurs Examination of peripheral vascular system by observation and palpation is normal  EYES: Ophthalmoscopic exam of optic discs and posterior segments is normal; no papilledema or hemorrhages No results found.  MUSCULOSKELETAL: Gait, strength, tone, movements noted in Neurologic exam below  NEUROLOGIC: MENTAL STATUS:  No flowsheet data found. awake, alert, oriented to person, place and time recent and remote memory intact normal attention and concentration language fluent, comprehension intact, naming intact fund of knowledge appropriate  CRANIAL NERVE:  2nd - no papilledema on fundoscopic exam 2nd, 3rd, 4th, 6th - pupils equal and reactive to light, visual fields full to confrontation, extraocular muscles intact, no nystagmus 5th - facial sensation symmetric 7th - facial strength symmetric 8th - hearing intact 9th - palate elevates symmetrically, uvula midline 11th - shoulder shrug symmetric 12th - tongue protrusion midline  MOTOR:  normal bulk and tone, full strength in the BUE, BLE  SENSORY:  normal and  symmetric to light touch, temperature, vibration  COORDINATION:  finger-nose-finger, fine finger movements normal  REFLEXES:  deep tendon reflexes TRACE and symmetric  GAIT/STATION:  narrow based  gait     DIAGNOSTIC DATA (LABS, IMAGING, TESTING) - I reviewed patient records, labs, notes, testing and imaging myself where available.  Lab Results  Component Value Date   WBC 5.2 11/24/2021   HGB 17.6 11/24/2021   HCT 51.1 (H) 11/24/2021   MCV 97 11/24/2021   PLT 221 11/24/2021      Component Value Date/Time   NA 138 11/24/2021 0834   K 4.5 11/24/2021 0834   CL 99 11/24/2021 0834   CO2 27 11/24/2021 0834   GLUCOSE 118 (H) 11/24/2021 0834   GLUCOSE 125 (H) 04/03/2020 0855   BUN 12 11/24/2021 0834   CREATININE 1.19 11/24/2021 0834   CREATININE 1.27 (H) 04/03/2020 0855   CREATININE 1.30 01/01/2013 0935   CALCIUM 9.5 11/24/2021 0834   CALCIUM 9.6 11/08/2019 1334   PROT 6.7 11/24/2021 0834   ALBUMIN 4.5 11/24/2021 0834   AST 17 11/24/2021 0834   AST 19 04/03/2020 0855   ALT 21 11/24/2021 0834   ALT 27 04/03/2020 0855   ALKPHOS 120 11/24/2021 0834   BILITOT 1.1 11/24/2021 0834   BILITOT 1.7 (H) 04/03/2020 0855   GFRNONAA 58 (L) 04/03/2020 0855   GFRAA >60 04/03/2020 0855   Lab Results  Component Value Date   CHOL 188 11/24/2021   HDL 52 11/24/2021   LDLCALC 107 (H) 11/24/2021   TRIG 168 (H) 11/24/2021   CHOLHDL 3.6 11/24/2021   Lab Results  Component Value Date   HGBA1C 5.6 09/01/2016   Lab Results  Component Value Date   VITAMINB12 220 05/18/2017   Lab Results  Component Value Date   TSH 0.186 (L) 04/16/2021    10/23/19 MRI cervical spine [I reviewed images myself and agree with interpretation. -VRP]  1. Unchanged small syrinx in the lower cervical spinal cord. 2. Unchanged cervical disc degeneration without spinal stenosis. 3. Moderate right neural foraminal stenosis at C4-5.  06/18/19 MRI thoracic spine No significant change. No abnormality seen to explain fecal incontinence. Thoracic spinal curvature with mild degenerative disc disease. No disc herniation or compressive central canal stenosis. Some facet degeneration on the right in the upper  thoracic region in the left in the lower thoracic region could certainly contribute to back pain. There does not appear to be any gross compressive foraminal stenosis.   Small thoracic cord syrinx is stable and not likely the cause of the clinical presentation.  04/24/18 MRI lumbar spine - Normal lumbar spine.    ASSESSMENT AND PLAN  71 y.o. year old male here with:    Dx:  1. Right arm pain   2. Chronic right shoulder pain   3. Pain in right paraspinal region     PLAN:  RIGHT SHOULDER, ARM, FLANK PAIN (post-traumatic since 2015 fall) - not related to cervical and thoracic syrinx - patient not interested in surgery, stimulator or pain meds - consider PT, OT, personal trainer, chiro, acupuncture, sauna, massage treatments  Return for return to PCP.  I spent 35 minutes of face-to-face and non-face-to-face time with patient.  This included previsit chart review, lab review, study review, order entry, electronic health record documentation, patient education.      Penni Bombard, MD 5/78/4696, 2:95 PM Certified in Neurology, Neurophysiology and Neuroimaging  [facility] [address], [place], [place] [address] [phone]

## 2022-01-06 ENCOUNTER — Other Ambulatory Visit: Payer: Self-pay | Admitting: Family Medicine

## 2022-01-06 DIAGNOSIS — K219 Gastro-esophageal reflux disease without esophagitis: Secondary | ICD-10-CM

## 2022-02-05 ENCOUNTER — Encounter: Payer: Self-pay | Admitting: Family Medicine

## 2022-02-07 ENCOUNTER — Other Ambulatory Visit: Payer: Self-pay | Admitting: Family Medicine

## 2022-02-07 DIAGNOSIS — I10 Essential (primary) hypertension: Secondary | ICD-10-CM

## 2022-02-10 ENCOUNTER — Ambulatory Visit: Payer: Medicare Other

## 2022-02-18 ENCOUNTER — Ambulatory Visit (INDEPENDENT_AMBULATORY_CARE_PROVIDER_SITE_OTHER): Payer: Medicare Other | Admitting: Family Medicine

## 2022-02-18 ENCOUNTER — Encounter: Payer: Self-pay | Admitting: Family Medicine

## 2022-02-18 VITALS — BP 136/78 | HR 72 | Temp 97.8°F | Ht 72.0 in | Wt 181.6 lb

## 2022-02-18 DIAGNOSIS — K1379 Other lesions of oral mucosa: Secondary | ICD-10-CM

## 2022-02-18 DIAGNOSIS — L570 Actinic keratosis: Secondary | ICD-10-CM | POA: Diagnosis not present

## 2022-02-18 NOTE — Progress Notes (Signed)
Subjective: CC: Skin lesion PCP: Janora Norlander, DO ZJI:RCVELF Jacob Kerr is a 71 y.o. male presenting to clinic today for:  1.  Skin lesion Patient reports a skin lesion on the right thigh that seems to come and go.  He recently scratched some of it off.  He unfortunately has not been able to get it clear up over the last several weeks despite trying.  He also has some lesions along his gums that he would like me to look at.  This has been brought to the attention of his dentist and his dentist recommended simple surveillance at this time.  He has an appointment coming up in July again with them   ROS: Per HPI  Allergies  Allergen Reactions   Cymbalta [Duloxetine Hcl] Other (See Comments)    Depression   Lyrica [Pregabalin] Other (See Comments)    Paranoid   Neurontin [Gabapentin] Other (See Comments)    nightmares   Robaxin [Methocarbamol] Other (See Comments)    Seeing "bright lights"   Sudafed [Pseudoephedrine Hcl] Other (See Comments)    Increased heart rate   Epinephrine Palpitations    Decreased blood pressure   Past Medical History:  Diagnosis Date   Acute meniscal tear of left knee    Allergic rhinitis    Anal fissure    Anxiety    BPH (benign prostatic hypertrophy)    Carpal tunnel syndrome of right wrist    Cervical spondylosis without myelopathy    Cervicogenic headache    Chronic headaches    Chronic neck pain    uses TENS unit  prn   Diverticulitis    Gallstones    GERD (gastroesophageal reflux disease)    H/O hiatal hernia    History of diverticulitis    History of kidney stones    History of TIA (transient ischemic attack)    04/ 2011---  no residuals   Hypertension    Hypothyroidism    Peripheral neuropathy    Shingles    Syrinx of spinal cord (HCC)    C4 -- C7    Current Outpatient Medications:    amitriptyline (ELAVIL) 10 MG tablet, TAKE 1 TABLET BY MOUTH EVERYDAY AT BEDTIME, Disp: 90 tablet, Rfl: 2   amLODipine (NORVASC) 10 MG  tablet, TAKE 1 TABLET BY MOUTH  DAILY, Disp: 100 tablet, Rfl: 0   aspirin 81 MG tablet, Take 81 mg by mouth at bedtime., Disp: , Rfl:    azelastine (ASTELIN) 0.1 % nasal spray, PLACE 2 SPRAYS INTO BOTH NOSTRILS 2 (TWO) TIMES DAILY., Disp: 30 mL, Rfl: 5   baclofen (LIORESAL) 10 MG tablet, Take 1 tablet (10 mg total) by mouth 3 (three) times daily as needed for muscle spasms., Disp: 270 tablet, Rfl: 1   Coenzyme Q10 (CO Q 10) 100 MG CAPS, Take 1 capsule by mouth daily., Disp: , Rfl:    levothyroxine (SYNTHROID) 150 MCG tablet, TAKE 1 TABLET BY MOUTH  DAILY BEFORE BREAKFAST  EXCEPT ONE-HALF TABLET BY  MOUTH ON SUNDAY, Disp: 81 tablet, Rfl: 1   LORazepam (ATIVAN) 0.5 MG tablet, Take 1 tablet (0.5 mg total) by mouth 2 (two) times daily as needed for anxiety., Disp: 30 tablet, Rfl: 0   losartan (COZAAR) 50 MG tablet, Take 1 tablet (50 mg total) by mouth daily., Disp: 90 tablet, Rfl: 3   NON FORMULARY, Take 3 capsules by mouth every morning. Nugenix Testosterone Booster / [place], Disp: , Rfl:    omeprazole (PRILOSEC) 20 MG capsule, TAKE  1 CAPSULE BY MOUTH TWICE  DAILY, Disp: 180 capsule, Rfl: 1   rosuvastatin (CRESTOR) 10 MG tablet, Take 1 tablet (10 mg total) by mouth daily., Disp: 90 tablet, Rfl: 3   trimethoprim-polymyxin b (POLYTRIM) ophthalmic solution, Place 1 drop into the left eye every 4 (four) hours., Disp: 10 mL, Rfl: 0   venlafaxine XR (EFFEXOR-XR) 75 MG 24 hr capsule, TAKE 1 CAPSULE BY MOUTH  DAILY WITH BREAKFAST, Disp: 90 capsule, Rfl: 3   Vitamin D, Ergocalciferol, (DRISDOL) 1.25 MG (50000 UNIT) CAPS capsule, TAKE 1 CAPSULE (50,000 UNITS TOTAL) BY MOUTH TWO TIMES A WEEK, Disp: 24 capsule, Rfl: 3   zinc gluconate 50 MG tablet, Take 50 mg by mouth daily., Disp: , Rfl:  Social History   Socioeconomic History   Marital status: Married    Spouse name: [name]   Number of children: 2   Years of education: 12   Highest education level: 12th grade  Occupational History   Occupation: Retired     Fish farm manager: Chief of Staff  Tobacco Use   Smoking status: Never   Smokeless tobacco: Never  Vaping Use   Vaping Use: Never used  Substance and Sexual Activity   Alcohol use: Not Currently    Alcohol/week: 7.0 standard drinks    Types: 7 Shots of liquor per week    Comment: 2 at night   Drug use: No   Sexual activity: Not on file  Other Topics Concern   Not on file  Social History Narrative   Lives at home with wife.       Patient is right handed.   Patient drinks 1-2 cups of caffeine daily.   Social Determinants of Health   Financial Resource Strain: Not on file  Food Insecurity: Not on file  Transportation Needs: Not on file  Physical Activity: Not on file  Stress: Not on file  Social Connections: Not on file  Intimate Partner Violence: Not on file   Family History  Problem Relation Age of Onset   Heart disease Mother    Prostate cancer Father    Heart disease Father    Stroke Father    Nephrolithiasis Sister    Cancer Sister        Intestinal   Hyperlipidemia Sister    Hyperlipidemia Sister    Cancer Sister        Sinus   Colon polyps Sister    Diabetes Brother    Colon polyps Brother        x2   COPD Other    Colon cancer Neg Hx    Esophageal cancer Neg Hx     Objective: Office vital signs reviewed. BP (!) 153/90   Pulse 72   Temp 97.8 F (36.6 C)   Ht 6' (1.829 m)   Wt 181 lb 9.6 oz (82.4 kg)   SpO2 98%   BMI 24.63 kg/m   Physical Examination:  General: Awake, alert, well nourished, No acute distress HEENT: Has a small, flesh colored papule noted along the superior gumline just over the canine on the right.  He has what appears to be mucoceles along the inner lip on the left. Skin: Right thigh with a crusted, raised hyperkeratotic lesion ~16mx5mm.  Nontender.  Not exudative.  No erythema or warmth  Cryotherapy Procedure:  Risks and benefits of procedure were reviewed with the patient.  Written consent obtained and scanned into the chart.  Lesion  of concern was identified and located on right thigh.  Liquid nitrogen  was applied to area of concern and extending out 34mllimeters beyond the border of the lesion.  Treated area was allowed to come back to room temperature before treating it a second time.  Patient tolerated procedure well and there were no immediate complications.  Home care instructions were reviewed with the patient and a handout was provided.   Assessment/ Plan: 71y.o. male   Actinic keratosis  Mucocele of buccal mucosa  Suspect lesion on the right thigh is an actinic keratosis versus squamous cell.  I have treated it with liquid nitrogen and we will see if this resolves this issue.  I am not quite sure what the lesion is along the gumline on the right just above the canine but the other lesions appear to be consistent with mucocele.  Agree with follow-up with dentist for ongoing surveillance of the other lesion  No orders of the defined types were placed in this encounter.  No orders of the defined types were placed in this encounter.    AJanora Norlander DO [facility]([phone]

## 2022-02-18 NOTE — Patient Instructions (Signed)
Mucocele of the Mouth A mucocele is a growth or bump (cyst) that is filled with mucus. A mucocele can form on many parts of the mouth, including the gums, the tongue, and the inside of the cheeks. A common spot is inside the lower lip. Mucoceles are not dangerous, and they are usually not painful. A mucocele can be uncomfortable if it is very large or if it is located under the tongue. Small mucoceles often clear up on their own. Treatment may not be needed. Mucoceles that are large or that keep coming back might need to be removed. What are the causes? Mucoceles form when the salivary ducts in the mouth are damaged and leak saliva. These ducts carry saliva from the salivary glands to the surface of the mouth. A mucocele can develop because of: A mouth injury. Sucking or biting on your lips or tongue. A blocked salivary duct. This is sometimes caused by swelling. A tongue or lip piercing. In some cases, the cause may not be known. What are the signs or symptoms? Symptoms of this condition include a smooth, painless bump inside your mouth. The bump may: Show up all of a sudden. Have thin walls and a bluish color. Change in size. Most are smaller than  inch (1.3 cm). Mucoceles under the tongue are called ranulas. These may be bigger and may push your tongue up and back. In some cases, this can make it hard to talk, swallow, or breathe. How is this diagnosed? This condition is usually diagnosed with a physical exam. Your health care provider will often be able to tell if you have a mucocele by looking at it and feeling it. You may also have tests, such as: An ultrasound to check for problems with your salivary gland. An X-ray to see if stones are blocking the exit of saliva, and if the mucocele is under the tongue. How is this treated? Treatment may depend on the size of the mucocele: For a small mucocele, treatment is usually not needed. It will drain on its own and go away. For larger  mucoceles and ranulas, treatment options may include: Steroid injections. Medicine is injected into the cyst to reduce swelling. Cryotherapy. Extreme cold is applied to the tissue to remove the cyst. Laser therapy. High-energy light is used to remove the cyst. Surgery. This may be done if the cyst does not go away or if it keeps coming back. The entire cyst may be taken out. In some cases, the salivary gland may also be removed. Follow these instructions at home: Take over-the-counter and prescription medicines only as told by your health care provider. Do not try to drain a mucocele on your own. Do not poke a hole in it. Do not use any products that contain nicotine or tobacco. These products include cigarettes, chewing tobacco, and vaping devices, such as e-cigarettes. If you need help quitting, ask your health care provider. Do not suck or bite on your lips or tongue. If your mucocele was removed, avoid hard, sharp, or spicy foods and foods that have high acidity while your mouth is healing. Keep all follow-up visits. This is important. Contact a health care provider if: You have a lump or cyst inside your mouth that: Does not go away. Becomes painful. You have a fever. Get help right away if: You have a lump or cyst in your mouth that quickly becomes large. You have a lump or cyst in your mouth that makes it hard to swallow, talk, or breathe.  These symptoms may be an emergency. Get help right away. Call 911. Do not wait to see if the symptoms will go away. Do not drive yourself to the hospital. Summary A mucocele is a growth or bump (cyst) that is filled with mucus. A mucocele can form on many parts of the mouth. Mucoceles are usually not painful. A mucocele can be uncomfortable if it is very large or if it is located under the tongue. For a small mucocele, treatment is usually not needed. It will drain on its own and go away. Larger mucoceles may need to be removed. Do not try to  drain a mucocele on your own. Do not poke a hole in it. This information is not intended to replace advice given to you by your health care provider. Make sure you discuss any questions you have with your health care provider. Document Revised: 09/09/2021 Document Reviewed: 09/09/2021 Elsevier Patient Education  [place].

## 2022-03-02 ENCOUNTER — Telehealth: Payer: Self-pay | Admitting: Family Medicine

## 2022-03-02 NOTE — Telephone Encounter (Signed)
Left message for patient to call back and schedule Medicare Annual Wellness Visit (AWV) to be completed by video or phone.   Last AWV: 02/09/2021  Please schedule at anytime with [facility] Nurse Health Advisor     45 minute appointment  Any questions, please contact me at [phone]      

## 2022-03-10 ENCOUNTER — Ambulatory Visit (INDEPENDENT_AMBULATORY_CARE_PROVIDER_SITE_OTHER): Payer: Medicare Other

## 2022-03-10 VITALS — Ht 72.0 in | Wt 181.0 lb

## 2022-03-10 DIAGNOSIS — Z0000 Encounter for general adult medical examination without abnormal findings: Secondary | ICD-10-CM | POA: Diagnosis not present

## 2022-03-10 NOTE — Patient Instructions (Signed)
Jacob Kerr , Thank you for taking time to come for your Medicare Wellness Visit. I appreciate your ongoing commitment to your health goals. Please review the following plan we discussed and let me know if I can assist you in the future.   Screening recommendations/referrals: Colonoscopy: Done 12/30/2014 Repeat in 10 years  Recommended yearly ophthalmology/optometry visit for glaucoma screening and checkup Recommended yearly dental visit for hygiene and checkup  Vaccinations: Influenza vaccine: Due Fall 2023. Pneumococcal vaccine: Done 03/07/2018 and 03/28/2019. Tdap vaccine: Done 06/23/2011 Repeat in 10 years  Shingles vaccine: Discussed. Available at your local pharmacy.   Covid-19: Declined.   Advanced directives: Please bring a copy of your health care power of attorney and living will to the office to be added to your chart at your convenience.   Conditions/risks identified: Aim for 30 minutes of exercise or brisk walking, 6-8 glasses of water, and 5 servings of fruits and vegetables each day. KEEP UP THE GOOD WORK!!  Next appointment: Follow up in one year for your annual wellness visit. 2024.  Preventive Care 40 Years and Older, Male  Preventive care refers to lifestyle choices and visits with your health care provider that can promote health and wellness. What does preventive care include? A yearly physical exam. This is also called an annual well check. Dental exams once or twice a year. Routine eye exams. Ask your health care provider how often you should have your eyes checked. Personal lifestyle choices, including: Daily care of your teeth and gums. Regular physical activity. Eating a healthy diet. Avoiding tobacco and drug use. Limiting alcohol use. Practicing safe sex. Taking low doses of aspirin every day. Taking vitamin and mineral supplements as recommended by your health care provider. What happens during an annual well check? The services and screenings done by  your health care provider during your annual well check will depend on your age, overall health, lifestyle risk factors, and family history of disease. Counseling  Your health care provider may ask you questions about your: Alcohol use. Tobacco use. Drug use. Emotional well-being. Home and relationship well-being. Sexual activity. Eating habits. History of falls. Memory and ability to understand (cognition). Work and work Statistician. Screening  You may have the following tests or measurements: Height, weight, and BMI. Blood pressure. Lipid and cholesterol levels. These may be checked every 5 years, or more frequently if you are over 26 years old. Skin check. Lung cancer screening. You may have this screening every year starting at age 29 if you have a 30-pack-year history of smoking and currently smoke or have quit within the past 15 years. Fecal occult blood test (FOBT) of the stool. You may have this test every year starting at age 24. Flexible sigmoidoscopy or colonoscopy. You may have a sigmoidoscopy every 5 years or a colonoscopy every 10 years starting at age 29. Prostate cancer screening. Recommendations will vary depending on your family history and other risks. Hepatitis C blood test. Hepatitis B blood test. Sexually transmitted disease (STD) testing. Diabetes screening. This is done by checking your blood sugar (glucose) after you have not eaten for a while (fasting). You may have this done every 1-3 years. Abdominal aortic aneurysm (AAA) screening. You may need this if you are a current or former smoker. Osteoporosis. You may be screened starting at age 33 if you are at high risk. Talk with your health care provider about your test results, treatment options, and if necessary, the need for more tests. Vaccines  Your health  care provider may recommend certain vaccines, such as: Influenza vaccine. This is recommended every year. Tetanus, diphtheria, and acellular pertussis  (Tdap, Td) vaccine. You may need a Td booster every 10 years. Zoster vaccine. You may need this after age 59. Pneumococcal 13-valent conjugate (PCV13) vaccine. One dose is recommended after age 11. Pneumococcal polysaccharide (PPSV23) vaccine. One dose is recommended after age 27. Talk to your health care provider about which screenings and vaccines you need and how often you need them. This information is not intended to replace advice given to you by your health care provider. Make sure you discuss any questions you have with your health care provider. Document Released: 10/10/2015 Document Revised: 06/02/2016 Document Reviewed: 07/15/2015 Elsevier Interactive Patient Education  2017 [place] Prevention in the Home Falls can cause injuries. They can happen to people of all ages. There are many things you can do to make your home safe and to help prevent falls. What can I do on the outside of my home? Regularly fix the edges of walkways and driveways and fix any cracks. Remove anything that might make you trip as you walk through a door, such as a raised step or threshold. Trim any bushes or trees on the path to your home. Use bright outdoor lighting. Clear any walking paths of anything that might make someone trip, such as rocks or tools. Regularly check to see if handrails are loose or broken. Make sure that both sides of any steps have handrails. Any raised decks and porches should have guardrails on the edges. Have any leaves, snow, or ice cleared regularly. Use sand or salt on walking paths during winter. Clean up any spills in your garage right away. This includes oil or grease spills. What can I do in the bathroom? Use night lights. Install grab bars by the toilet and in the tub and shower. Do not use towel bars as grab bars. Use non-skid mats or decals in the tub or shower. If you need to sit down in the shower, use a plastic, non-slip stool. Keep the floor dry. Clean  up any water that spills on the floor as soon as it happens. Remove soap buildup in the tub or shower regularly. Attach bath mats securely with double-sided non-slip rug tape. Do not have throw rugs and other things on the floor that can make you trip. What can I do in the bedroom? Use night lights. Make sure that you have a light by your bed that is easy to reach. Do not use any sheets or blankets that are too big for your bed. They should not hang down onto the floor. Have a firm chair that has side arms. You can use this for support while you get dressed. Do not have throw rugs and other things on the floor that can make you trip. What can I do in the kitchen? Clean up any spills right away. Avoid walking on wet floors. Keep items that you use a lot in easy-to-reach places. If you need to reach something above you, use a strong step stool that has a grab bar. Keep electrical cords out of the way. Do not use floor polish or wax that makes floors slippery. If you must use wax, use non-skid floor wax. Do not have throw rugs and other things on the floor that can make you trip. What can I do with my stairs? Do not leave any items on the stairs. Make sure that there are handrails on  both sides of the stairs and use them. Fix handrails that are broken or loose. Make sure that handrails are as long as the stairways. Check any carpeting to make sure that it is firmly attached to the stairs. Fix any carpet that is loose or worn. Avoid having throw rugs at the top or bottom of the stairs. If you do have throw rugs, attach them to the floor with carpet tape. Make sure that you have a light switch at the top of the stairs and the bottom of the stairs. If you do not have them, ask someone to add them for you. What else can I do to help prevent falls? Wear shoes that: Do not have high heels. Have rubber bottoms. Are comfortable and fit you well. Are closed at the toe. Do not wear sandals. If you  use a stepladder: Make sure that it is fully opened. Do not climb a closed stepladder. Make sure that both sides of the stepladder are locked into place. Ask someone to hold it for you, if possible. Clearly mark and make sure that you can see: Any grab bars or handrails. First and last steps. Where the edge of each step is. Use tools that help you move around (mobility aids) if they are needed. These include: Canes. Walkers. Scooters. Crutches. Turn on the lights when you go into a dark area. Replace any light bulbs as soon as they burn out. Set up your furniture so you have a clear path. Avoid moving your furniture around. If any of your floors are uneven, fix them. If there are any pets around you, be aware of where they are. Review your medicines with your doctor. Some medicines can make you feel dizzy. This can increase your chance of falling. Ask your doctor what other things that you can do to help prevent falls. This information is not intended to replace advice given to you by your health care provider. Make sure you discuss any questions you have with your health care provider. Document Released: 07/10/2009 Document Revised: 02/19/2016 Document Reviewed: 10/18/2014 Elsevier Interactive Patient Education  2017 [employer].

## 2022-03-10 NOTE — Progress Notes (Cosign Needed)
Subjective:   Jacob Kerr is a 71 y.o. male who presents for Medicare Annual/Subsequent preventive examination. Virtual Visit via Telephone Note  I connected with  [name] on 03/10/22 at  2:00 PM EDT by telephone and verified that I am speaking with the correct person using two identifiers.  Location: Patient: HOME Provider: [facility] Persons participating in the virtual visit: patient/Nurse Health Advisor   I discussed the limitations, risks, security and privacy concerns of performing an evaluation and management service by telephone and the availability of in person appointments. The patient expressed understanding and agreed to proceed.  Interactive audio and video telecommunications were attempted between this nurse and patient, however failed, due to patient having technical difficulties OR patient did not have access to video capability.  We continued and completed visit with audio only.  Some vital signs may be absent or patient reported.   Chriss Driver, LPN  Review of Systems     Cardiac Risk Factors include: advanced age (>13mn, >>2women);hypertension;dyslipidemia;male gender;sedentary lifestyle     Objective:    Today's Vitals   03/10/22 1405 03/10/22 1408  Weight: 181 lb (82.1 kg)   Height: 6' (1.829 m)   PainSc:  6    Body mass index is 24.55 kg/m.     03/10/2022    2:13 PM 02/09/2021    2:14 PM 04/03/2020    9:36 AM 02/07/2020    2:36 PM 01/22/2020   10:13 AM 11/08/2019   12:19 PM 04/11/2019    8:54 AM  Advanced Directives  Does Patient Have a Medical Advance Directive? No No No No No No No  Would patient like information on creating a medical advance directive? No - Patient declined No - Patient declined No - Patient declined No - Patient declined No - Patient declined Yes (MAU/Ambulatory/Procedural Areas - Information given) No - Patient declined    Current Medications (verified) Outpatient Encounter Medications as of 03/10/2022  Medication  Sig   amitriptyline (ELAVIL) 10 MG tablet TAKE 1 TABLET BY MOUTH EVERYDAY AT BEDTIME   amLODipine (NORVASC) 10 MG tablet TAKE 1 TABLET BY MOUTH  DAILY   aspirin 81 MG tablet Take 81 mg by mouth at bedtime.   azelastine (ASTELIN) 0.1 % nasal spray PLACE 2 SPRAYS INTO BOTH NOSTRILS 2 (TWO) TIMES DAILY.   baclofen (LIORESAL) 10 MG tablet Take 1 tablet (10 mg total) by mouth 3 (three) times daily as needed for muscle spasms.   Coenzyme Q10 (CO Q 10) 100 MG CAPS Take 1 capsule by mouth daily.   levothyroxine (SYNTHROID) 150 MCG tablet TAKE 1 TABLET BY MOUTH  DAILY BEFORE BREAKFAST  EXCEPT ONE-HALF TABLET BY  MOUTH ON SUNDAY   LORazepam (ATIVAN) 0.5 MG tablet Take 1 tablet (0.5 mg total) by mouth 2 (two) times daily as needed for anxiety.   losartan (COZAAR) 50 MG tablet Take 1 tablet (50 mg total) by mouth daily.   NON FORMULARY Take 3 capsules by mouth every morning. Nugenix Testosterone Booster / [employer]   omeprazole (PRILOSEC) 20 MG capsule TAKE 1 CAPSULE BY MOUTH TWICE  DAILY   rosuvastatin (CRESTOR) 10 MG tablet Take 1 tablet (10 mg total) by mouth daily.   trimethoprim-polymyxin b (POLYTRIM) ophthalmic solution Place 1 drop into the left eye every 4 (four) hours.   venlafaxine XR (EFFEXOR-XR) 75 MG 24 hr capsule TAKE 1 CAPSULE BY MOUTH  DAILY WITH BREAKFAST   Vitamin D, Ergocalciferol, (DRISDOL) 1.25 MG (50000 UNIT) CAPS capsule  TAKE 1 CAPSULE (50,000 UNITS TOTAL) BY MOUTH TWO TIMES A WEEK   zinc gluconate 50 MG tablet Take 50 mg by mouth daily.   No facility-administered encounter medications on file as of 03/10/2022.    Allergies (verified) Cymbalta [duloxetine hcl], Lyrica [pregabalin], Neurontin [gabapentin], Robaxin [methocarbamol], Sudafed [pseudoephedrine hcl], and Epinephrine   History: Past Medical History:  Diagnosis Date   Acute meniscal tear of left knee    Allergic rhinitis    Anal fissure    Anxiety    BPH (benign prostatic hypertrophy)    Carpal tunnel syndrome of right  wrist    Cervical spondylosis without myelopathy    Cervicogenic headache    Chronic headaches    Chronic neck pain    uses TENS unit  prn   Diverticulitis    Gallstones    GERD (gastroesophageal reflux disease)    H/O hiatal hernia    History of diverticulitis    History of kidney stones    History of TIA (transient ischemic attack)    04/ 2011---  no residuals   Hypertension    Hypothyroidism    Peripheral neuropathy    Shingles    [place] of spinal cord ([place])    C4 -- C7   Past Surgical History:  Procedure Laterality Date   CARDIAC CATHETERIZATION  02-18-2010   dr Angelena Form   normal coronary arteries/  normal lvsf--  ef 65%   COLONOSCOPY     [place]  2018   [facility] [place] [facility]   KNEE ARTHROSCOPY Left 03/13/2014   Procedure: LEFT ARTHROSCOPY KNEE WITH DEBRIDMENT;  Surgeon: Gearlean Alf, MD;  Location: [place];  Service: Orthopedics;  Laterality: Left;   LAPAROSCOPIC CHOLECYSTECTOMY  01/30/2003   NEGATIVE SLEEP STUDY  2013   per pt   RADIOFREQUENCY ABLATION NERVES  05/2016   REMOVAL FORGEIN BODY FROM EAR  AGE 49   RIGHT URETEROSCOPIC LASER LITHOTRIPSY STONE EXTRACTION /  STENT PLACEMENT  05/10/2000   TRANSTHORACIC ECHOCARDIOGRAM  01/16/2010   normal lvf/  ef  60%/  mild lae   Family History  Problem Relation Age of Onset   Heart disease Mother    Prostate cancer Father    Heart disease Father    Stroke Father    Nephrolithiasis Sister    Cancer Sister        Intestinal   Hyperlipidemia Sister    Hyperlipidemia Sister    Cancer Sister        Sinus   Colon polyps Sister    Diabetes Brother    Colon polyps Brother        x2   COPD Other    Colon cancer Neg Hx    Esophageal cancer Neg Hx    Social History   Socioeconomic History   Marital status: Married    Spouse name: [name]   Number of children: 2   Years of education: 12   Highest education level: 12th grade  Occupational History    Occupation: Retired    Fish farm manager: Chief of Staff  Tobacco Use   Smoking status: Never   Smokeless tobacco: Never  Vaping Use   Vaping Use: Never used  Substance and Sexual Activity   Alcohol use: Not Currently    Alcohol/week: 7.0 standard drinks of alcohol    Types: 7 Shots of liquor per week    Comment: 2 at night   Drug use: No   Sexual activity: Not on  file  Other Topics Concern   Not on file  Social History Narrative   Lives at home with wife.       Patient is right handed.   Patient drinks 1-2 cups of caffeine daily.   Social Determinants of Health   Financial Resource Strain: Low Risk  (03/10/2022)   Overall Financial Resource Strain (CARDIA)    Difficulty of Paying Living Expenses: Not hard at all  Food Insecurity: No Food Insecurity (03/10/2022)   Hunger Vital Sign    Worried About Running Out of Food in the Last Year: Never true    Ran Out of Food in the Last Year: Never true  Transportation Needs: No Transportation Needs (03/10/2022)   PRAPARE - Hydrologist (Medical): No    Lack of Transportation (Non-Medical): No  Physical Activity: Sufficiently Active (03/10/2022)   Exercise Vital Sign    Days of Exercise per Week: 3 days    Minutes of Exercise per Session: 60 min  Stress: No Stress Concern Present (03/10/2022)   [place]    Feeling of Stress : Not at all  Social Connections: [place] (03/10/2022)   Social Connection and Isolation Panel [NHANES]    Frequency of Communication with Friends and Family: More than three times a week    Frequency of Social Gatherings with Friends and Family: More than three times a week    Attends Religious Services: 1 to 4 times per year    Active Member of [employer] or Organizations: Yes    Attends Music therapist: More than 4 times per year    Marital Status: Married    Tobacco Counseling Counseling given: Not  Answered   Clinical Intake:  Pre-visit preparation completed: Yes  Pain : 0-10 Pain Score: 6  Pain Type: Chronic pain Pain Location: Back Pain Descriptors / Indicators: Aching, Discomfort, Dull Pain Onset: More than a month ago Pain Frequency: Intermittent     BMI - recorded: 24.55 Nutritional Status: BMI of 19-24  Normal Nutritional Risks: None Diabetes: No  How often do you need to have someone help you when you read instructions, pamphlets, or other written materials from your doctor or pharmacy?: 1 - Never  Diabetic?NO  Interpreter Needed?: No  Information entered by :: mj Brandy Zuba, lpn   Activities of Daily Living    03/10/2022    2:19 PM  In your present state of health, do you have any difficulty performing the following activities:  Hearing? 1  Vision? 0  Difficulty concentrating or making decisions? 0  Walking or climbing stairs? 0  Dressing or bathing? 0  Doing errands, shopping? 0  Preparing Food and eating ? N  Using the Toilet? N  In the past six months, have you accidently leaked urine? N  Do you have problems with loss of bowel control? N  Managing your Medications? N  Managing your Finances? N  Housekeeping or managing your Housekeeping? N    Patient Care Team: Janora Norlander, DO as PCP - General (Family Medicine) Luna Kitchens., MD (Neurosurgery) Sable Feil, MD (Gastroenterology) Kathrynn Ducking, MD (Inactive) (Neurology) Edrick Oh, MD (Nephrology) Minus Breeding, MD as Consulting Physician (Cardiology) Janora Norlander, DO as Consulting Physician (Family Medicine)  Indicate any recent Medical Services you may have received from other than [facility] providers in the past year (date may be approximate).     Assessment:   This is  a routine wellness examination for Jacob Kerr.  Hearing/Vision screen Hearing Screening - Comments:: No hearing issues other than tinnitus. Vision Screening - Comments:: Glasses. [facility] 2023.  Dietary issues and exercise activities discussed: Current Exercise Habits: Home exercise routine, Type of exercise: walking;strength training/weights;stretching, Time (Minutes): 60, Frequency (Times/Week): 3, Weekly Exercise (Minutes/Week): 180, Intensity: Mild, Exercise limited by: cardiac condition(s);orthopedic condition(s)   Goals Addressed             This Visit's Progress    Patient Stated   On track    02/07/2020 AWV Goal: Fall Prevention  Over the next year, patient will decrease their risk for falls by: Using assistive devices, such as a cane or walker, as needed Identifying fall risks within their home and correcting them by: Removing throw rugs Adding handrails to stairs or ramps Removing clutter and keeping a clear pathway throughout the home Increasing light, especially at night Adding shower handles/bars Raising toilet seat Identifying potential personal risk factors for falls: Medication side effects Incontinence/urgency Vestibular dysfunction Hearing loss Musculoskeletal disorders Neurological disorders Orthostatic hypotension         Depression Screen    03/10/2022    2:11 PM 02/18/2022   10:52 AM 11/23/2021    1:03 PM 04/16/2021   10:10 AM 02/09/2021    2:05 PM 12/09/2020    8:15 AM 05/21/2020    1:01 PM  PHQ 2/9 Scores  PHQ - 2 Score 0 0 0 1 0 0 0  PHQ- 9 Score   3 7  0     Fall Risk    03/10/2022    2:15 PM 02/18/2022   10:52 AM 11/23/2021    1:03 PM 04/16/2021   10:09 AM 02/09/2021    2:14 PM  Fall Risk   Falls in the past year? '1 1 1 1 1  '$ Number falls in past yr: '1 1 1 1 '$ 0  Injury with Fall? 0 0 0 0 0  Risk for fall due to : History of fall(s);Impaired balance/gait History of fall(s) History of fall(s) Impaired balance/gait History of fall(s);Impaired balance/gait;Orthopedic patient  Follow up Falls prevention discussed Education provided Education provided Education provided Falls prevention discussed;Education provided     FALL RISK PREVENTION PERTAINING TO THE HOME:  Any stairs in or around the home? Yes  If so, are there any without handrails? No  Home free of loose throw rugs in walkways, pet beds, electrical cords, etc? Yes  Adequate lighting in your home to reduce risk of falls? Yes   ASSISTIVE DEVICES UTILIZED TO PREVENT FALLS:  Life alert? No  Use of a cane, walker or w/c? No  Grab bars in the bathroom? No  Shower chair or bench in shower? No  Elevated toilet seat or a handicapped toilet? Yes   TIMED UP AND GO:  Was the test performed? No .  PHONE VISIT. Cognitive Function:        03/10/2022    2:19 PM 02/07/2020    2:39 PM 02/06/2019    3:58 PM  6CIT Screen  What Year? 0 points 0 points 0 points  What month? 0 points 0 points 0 points  What time? 0 points 0 points 0 points  Count back from 20 0 points 0 points 0 points  Months in reverse 2 points 0 points 2 points  Repeat phrase 0 points 0 points 0 points  Total Score 2 points 0 points 2 points    Immunizations Immunization History  Administered Date(s) Administered  Influenza Whole 06/27/2012   Influenza, High Dose Seasonal PF 07/21/2018   Influenza,inj,Quad PF,6+ Mos 07/04/2013, 07/12/2014, 07/31/2015, 08/18/2016   Influenza-Unspecified 07/13/2017   Pneumococcal Conjugate-13 03/07/2018   Pneumococcal Polysaccharide-23 03/28/2019   Tdap 09/27/2005, 06/23/2011   Zoster, Live 05/20/2014    TDAP status: Due, Education has been provided regarding the importance of this vaccine. Advised may receive this vaccine at local pharmacy or Health Dept. Aware to provide a copy of the vaccination record if obtained from local pharmacy or Health Dept. Verbalized acceptance and understanding.  Flu Vaccine status: Due, Education has been provided regarding the importance of this vaccine. Advised may receive this vaccine at local pharmacy or Health Dept. Aware to provide a copy of the vaccination record if obtained from local pharmacy or  Health Dept. Verbalized acceptance and understanding.  Pneumococcal vaccine status: Up to date  Covid-19 vaccine status: Declined, Education has been provided regarding the importance of this vaccine but patient still declined. Advised may receive this vaccine at local pharmacy or Health Dept.or vaccine clinic. Aware to provide a copy of the vaccination record if obtained from local pharmacy or Health Dept. Verbalized acceptance and understanding.  Qualifies for Shingles Vaccine? Yes   Zostavax completed Yes   Shingrix Completed?: No.    Education has been provided regarding the importance of this vaccine. Patient has been advised to call insurance company to determine out of pocket expense if they have not yet received this vaccine. Advised may also receive vaccine at local pharmacy or Health Dept. Verbalized acceptance and understanding.  Screening Tests Health Maintenance  Topic Date Due   Zoster Vaccines- Shingrix (1 of 2) 05/27/2022 (Originally 09/13/1970)   TETANUS/TDAP  11/23/2022 (Originally 06/22/2021)   INFLUENZA VACCINE  04/27/2022   COLONOSCOPY (Pts 45-51yr Insurance coverage will need to be confirmed)  12/29/2024   Pneumonia Vaccine 71 Years old  Completed   Hepatitis C Screening  Completed   HPV VACCINES  Aged Out   COVID-19 Vaccine  Discontinued    Health Maintenance  There are no preventive care reminders to display for this patient.   Colorectal cancer screening: Type of screening: Colonoscopy. Completed 12/30/2014. Repeat every 10 years  Lung Cancer Screening: (Low Dose CT Chest recommended if Age 71-80years, 30 pack-year currently smoking OR have quit w/in 15years.) does not qualify.  Additional Screening:  Hepatitis C Screening: does qualify; Completed 03/12/2018  Vision Screening: Recommended annual ophthalmology exams for early detection of glaucoma and other disorders of the eye. Is the patient up to date with their annual eye exam?  Yes  Who is the  provider or what is the name of the office in which the patient attends annual eye exams? [facility] If pt is not established with a provider, would they like to be referred to a provider to establish care? No .   Dental Screening: Recommended annual dental exams for proper oral hygiene  Community Resource Referral / Chronic Care Management: CRR required this visit?  No   CCM required this visit?  No      Plan:     I have personally reviewed and noted the following in the patient's chart:   Medical and social history Use of alcohol, tobacco or illicit drugs  Current medications and supplements including opioid prescriptions. Patient is not currently taking opioid prescriptions. Functional ability and status Nutritional status Physical activity Advanced directives List of other physicians Hospitalizations, surgeries, and ER visits in previous 12 months Vitals Screenings to include cognitive,  depression, and falls Referrals and appointments  In addition, I have reviewed and discussed with patient certain preventive protocols, quality metrics, and best practice recommendations. A written personalized care plan for preventive services as well as general preventive health recommendations were provided to patient.     Chriss Driver, LPN   1/67/[id number]   Nurse Notes: Discussed Shingrix and how to obtain.

## 2022-03-15 ENCOUNTER — Other Ambulatory Visit: Payer: Self-pay | Admitting: Family Medicine

## 2022-03-15 DIAGNOSIS — E034 Atrophy of thyroid (acquired): Secondary | ICD-10-CM

## 2022-03-19 ENCOUNTER — Other Ambulatory Visit: Payer: Self-pay | Admitting: Family Medicine

## 2022-04-01 DIAGNOSIS — Z85828 Personal history of other malignant neoplasm of skin: Secondary | ICD-10-CM | POA: Diagnosis not present

## 2022-04-01 DIAGNOSIS — D2261 Melanocytic nevi of right upper limb, including shoulder: Secondary | ICD-10-CM | POA: Diagnosis not present

## 2022-04-01 DIAGNOSIS — L821 Other seborrheic keratosis: Secondary | ICD-10-CM | POA: Diagnosis not present

## 2022-04-01 DIAGNOSIS — L814 Other melanin hyperpigmentation: Secondary | ICD-10-CM | POA: Diagnosis not present

## 2022-04-01 DIAGNOSIS — L579 Skin changes due to chronic exposure to nonionizing radiation, unspecified: Secondary | ICD-10-CM | POA: Diagnosis not present

## 2022-04-08 ENCOUNTER — Other Ambulatory Visit: Payer: Self-pay | Admitting: Family Medicine

## 2022-04-08 DIAGNOSIS — K219 Gastro-esophageal reflux disease without esophagitis: Secondary | ICD-10-CM

## 2022-05-09 ENCOUNTER — Other Ambulatory Visit: Payer: Self-pay | Admitting: Family Medicine

## 2022-05-09 DIAGNOSIS — I10 Essential (primary) hypertension: Secondary | ICD-10-CM

## 2022-05-09 DIAGNOSIS — N1831 Chronic kidney disease, stage 3a: Secondary | ICD-10-CM

## 2022-05-27 ENCOUNTER — Other Ambulatory Visit: Payer: Self-pay | Admitting: Family Medicine

## 2022-05-27 DIAGNOSIS — E034 Atrophy of thyroid (acquired): Secondary | ICD-10-CM

## 2022-06-01 ENCOUNTER — Telehealth: Payer: Self-pay | Admitting: Diagnostic Neuroimaging

## 2022-06-01 DIAGNOSIS — G950 Syringomyelia and syringobulbia: Secondary | ICD-10-CM

## 2022-06-01 NOTE — Telephone Encounter (Signed)
Called patient to advise we've not received a request for his records. He stated he has form, has tried to fax it to [place] today but won't go through. I gave him main fax # [phone]. He will fax to this #. Patient verbalized understanding, appreciation.

## 2022-06-01 NOTE — Telephone Encounter (Signed)
Pt requesting referral for [facility] to participate in a program for syrnex. Have been trying to fax over forms request medical information to send to [facility]. Would like a call  from the nurse.

## 2022-06-02 NOTE — Telephone Encounter (Signed)
Received fax form patient with [facility] CSF eval referral form. Sent to MD for completion.

## 2022-06-03 NOTE — Telephone Encounter (Signed)
Called patient to discuss [facility] paperwork for CSF. He understands he doesn't have CSF leak but stated he wants it followed, and the program will accept him.  Information required by form printed, form on MD desk to sign, referral placed.

## 2022-06-07 ENCOUNTER — Other Ambulatory Visit: Payer: Self-pay | Admitting: Family Medicine

## 2022-06-07 NOTE — Telephone Encounter (Signed)
Referral signed, faxed to [facility] [phone]. Attached referral order, MRI reports, office notes, copy of insurance card. Received confirmation.

## 2022-06-08 ENCOUNTER — Telehealth: Payer: Self-pay | Admitting: Diagnostic Neuroimaging

## 2022-06-08 NOTE — Telephone Encounter (Signed)
Sent referral requested by neurologist to [facility] CSF Leak program Fax: [phone]

## 2022-06-15 ENCOUNTER — Encounter: Payer: Self-pay | Admitting: Family Medicine

## 2022-06-16 ENCOUNTER — Other Ambulatory Visit: Payer: Self-pay | Admitting: Family Medicine

## 2022-06-16 DIAGNOSIS — M47812 Spondylosis without myelopathy or radiculopathy, cervical region: Secondary | ICD-10-CM

## 2022-06-16 MED ORDER — BACLOFEN 10 MG PO TABS: 10.0000 mg | ORAL_TABLET | Freq: Three times a day (TID) | ORAL | 1 refills | Status: DC | PRN

## 2022-06-23 ENCOUNTER — Other Ambulatory Visit: Payer: Self-pay | Admitting: Family Medicine

## 2022-06-23 DIAGNOSIS — K219 Gastro-esophageal reflux disease without esophagitis: Secondary | ICD-10-CM

## 2022-07-02 DIAGNOSIS — M79672 Pain in left foot: Secondary | ICD-10-CM | POA: Diagnosis not present

## 2022-07-02 DIAGNOSIS — M79671 Pain in right foot: Secondary | ICD-10-CM | POA: Diagnosis not present

## 2022-07-16 ENCOUNTER — Ambulatory Visit (INDEPENDENT_AMBULATORY_CARE_PROVIDER_SITE_OTHER): Payer: Medicare Other | Admitting: Family Medicine

## 2022-07-16 ENCOUNTER — Encounter: Payer: Self-pay | Admitting: Family Medicine

## 2022-07-16 VITALS — BP 150/86 | HR 74 | Temp 97.2°F | Ht 72.0 in | Wt 183.0 lb

## 2022-07-16 DIAGNOSIS — N1831 Chronic kidney disease, stage 3a: Secondary | ICD-10-CM | POA: Diagnosis not present

## 2022-07-16 DIAGNOSIS — G950 Syringomyelia and syringobulbia: Secondary | ICD-10-CM

## 2022-07-16 DIAGNOSIS — E7800 Pure hypercholesterolemia, unspecified: Secondary | ICD-10-CM

## 2022-07-16 DIAGNOSIS — E034 Atrophy of thyroid (acquired): Secondary | ICD-10-CM | POA: Diagnosis not present

## 2022-07-16 DIAGNOSIS — M47812 Spondylosis without myelopathy or radiculopathy, cervical region: Secondary | ICD-10-CM

## 2022-07-16 DIAGNOSIS — N400 Enlarged prostate without lower urinary tract symptoms: Secondary | ICD-10-CM

## 2022-07-16 DIAGNOSIS — F410 Panic disorder [episodic paroxysmal anxiety] without agoraphobia: Secondary | ICD-10-CM

## 2022-07-16 DIAGNOSIS — I10 Essential (primary) hypertension: Secondary | ICD-10-CM

## 2022-07-16 DIAGNOSIS — R39198 Other difficulties with micturition: Secondary | ICD-10-CM

## 2022-07-16 LAB — URINALYSIS
Bilirubin, UA: NEGATIVE
Glucose, UA: NEGATIVE
Ketones, UA: NEGATIVE
Leukocytes,UA: NEGATIVE
Nitrite, UA: NEGATIVE
Protein,UA: NEGATIVE
RBC, UA: NEGATIVE
Specific Gravity, UA: 1.02 (ref 1.005–1.030)
Urobilinogen, Ur: 0.2 mg/dL (ref 0.2–1.0)
pH, UA: 7 (ref 5.0–7.5)

## 2022-07-16 MED ORDER — VENLAFAXINE HCL ER 75 MG PO CP24: 75.0000 mg | ORAL_CAPSULE | Freq: Every day | ORAL | 3 refills | Status: DC

## 2022-07-16 MED ORDER — LEVOTHYROXINE SODIUM 150 MCG PO TABS: ORAL_TABLET | ORAL | 3 refills | Status: DC

## 2022-07-16 MED ORDER — ROSUVASTATIN CALCIUM 10 MG PO TABS: 10.0000 mg | ORAL_TABLET | Freq: Every day | ORAL | 3 refills | Status: DC

## 2022-07-16 MED ORDER — LOSARTAN POTASSIUM 50 MG PO TABS: 50.0000 mg | ORAL_TABLET | Freq: Every day | ORAL | 3 refills | Status: DC

## 2022-07-16 MED ORDER — AMLODIPINE BESYLATE 10 MG PO TABS: 10.0000 mg | ORAL_TABLET | Freq: Every day | ORAL | 3 refills | Status: DC

## 2022-07-16 NOTE — Patient Instructions (Signed)
MRI brain w/ and without contrast ordered.  Let me know who to send results to You had labs performed today.  You will be contacted with the results of the labs once they are available, usually in the next 3 business days for routine lab work.  If you have an active my chart account, they will be released to your MyChart.  If you prefer to have these labs released to you via telephone, please let us know.

## 2022-07-16 NOTE — Progress Notes (Signed)
Subjective: CC: Chronic follow-up PCP: Janora Norlander, DO [id number] Jacob Kerr is a 71 y.o. male presenting to clinic today for:  1.  Chronic back pain, history of syringomyelia His consultation cannot be completed at the [facility] location until he has MRI with contrast of the brain.  Unfortunately, his previous specialist has not ordered and he asks if we can take care of this or facilitate this for him today.  Continues to have quite a bit of back pain but denies any headache.  2.  Hypothyroidism Compliant with thyroid replacement medication.  No reports of tremor, heart palpitations or difficulty swallowing  3.  Depression He mostly gets anxious around the holidays.  He still has Ativan on hand from 3 years ago and he almost never uses this unless its around the holidays.  He does report stressors involving his daughter and son-in-law as he does not agree with her life choices.  He is compliant with his Effexor  4.  Vitamin D deficiency Previously placed on vitamin D 50,000 units twice weekly by his hematologist.  He has not been able to get refills on this but is interested in restarting.  5.  Decreased urination Patient reports decreased urination.  No hematuria, dysuria.  Has chronic back pain as above   ROS: Per HPI  Allergies  Allergen Reactions   Cymbalta [Duloxetine Hcl] Other (See Comments)    Depression   Lyrica [Pregabalin] Other (See Comments)    Paranoid   Neurontin [Gabapentin] Other (See Comments)    nightmares   Robaxin [Methocarbamol] Other (See Comments)    Seeing "bright lights"   Sudafed [Pseudoephedrine Hcl] Other (See Comments)    Increased heart rate   Epinephrine Palpitations    Decreased blood pressure   Past Medical History:  Diagnosis Date   Acute meniscal tear of left knee    Allergic rhinitis    Anal fissure    Anxiety    BPH (benign prostatic hypertrophy)    Carpal tunnel syndrome of right wrist    Cervical spondylosis without  myelopathy    Cervicogenic headache    Chronic headaches    Chronic neck pain    uses TENS unit  prn   Diverticulitis    Gallstones    GERD (gastroesophageal reflux disease)    H/O hiatal hernia    History of diverticulitis    History of kidney stones    History of TIA (transient ischemic attack)    04/ 2011---  no residuals   Hypertension    Hypothyroidism    Peripheral neuropathy    Shingles    Syrinx of spinal cord (HCC)    C4 -- C7    Current Outpatient Medications:    amLODipine (NORVASC) 10 MG tablet, TAKE 1 TABLET BY MOUTH DAILY, Disp: 100 tablet, Rfl: 0   aspirin 81 MG tablet, Take 81 mg by mouth at bedtime., Disp: , Rfl:    azelastine (ASTELIN) 0.1 % nasal spray, PLACE 2 SPRAYS INTO BOTH NOSTRILS 2 (TWO) TIMES DAILY., Disp: 30 mL, Rfl: 5   baclofen (LIORESAL) 10 MG tablet, Take 1 tablet (10 mg total) by mouth 3 (three) times daily as needed for muscle spasms., Disp: 270 tablet, Rfl: 1   Coenzyme Q10 (CO Q 10) 100 MG CAPS, Take 1 capsule by mouth daily., Disp: , Rfl:    levothyroxine (SYNTHROID) 150 MCG tablet, TAKE 1 TABLET BY MOUTH DAILY  BEFORE BREAKFAST EXCEPT ONE-HALF TABLET BY MOUTH ON SUNDAY, Disp: 94 tablet, Rfl:  2   LORazepam (ATIVAN) 0.5 MG tablet, Take 1 tablet (0.5 mg total) by mouth 2 (two) times daily as needed for anxiety., Disp: 30 tablet, Rfl: 0   losartan (COZAAR) 50 MG tablet, TAKE 1 TABLET BY MOUTH  DAILY, Disp: 100 tablet, Rfl: 0   NON FORMULARY, Take 3 capsules by mouth every morning. Nugenix Testosterone Booster / [place], Disp: , Rfl:    omeprazole (PRILOSEC) 20 MG capsule, TAKE 1 CAPSULE BY MOUTH TWICE  DAILY, Disp: 180 capsule, Rfl: 0   rosuvastatin (CRESTOR) 10 MG tablet, Take 1 tablet (10 mg total) by mouth daily., Disp: 90 tablet, Rfl: 3   trimethoprim-polymyxin b (POLYTRIM) ophthalmic solution, Place 1 drop into the left eye every 4 (four) hours., Disp: 10 mL, Rfl: 0   venlafaxine XR (EFFEXOR-XR) 75 MG 24 hr capsule, TAKE 1 CAPSULE BY MOUTH DAILY   WITH BREAKFAST, Disp: 100 capsule, Rfl: 0   Vitamin D, Ergocalciferol, (DRISDOL) 1.25 MG (50000 UNIT) CAPS capsule, TAKE 1 CAPSULE (50,000 UNITS TOTAL) BY MOUTH TWO TIMES A WEEK, Disp: 24 capsule, Rfl: 3   zinc gluconate 50 MG tablet, Take 50 mg by mouth daily., Disp: , Rfl:  Social History   Socioeconomic History   Marital status: Married    Spouse name: [name]   Number of children: 2   Years of education: 12   Highest education level: 12th grade  Occupational History   Occupation: Retired    Fish farm manager: Chief of Staff  Tobacco Use   Smoking status: Never   Smokeless tobacco: Never  Vaping Use   Vaping Use: Never used  Substance and Sexual Activity   Alcohol use: Not Currently    Alcohol/week: 7.0 standard drinks of alcohol    Types: 7 Shots of liquor per week    Comment: 2 at night   Drug use: No   Sexual activity: Not on file  Other Topics Concern   Not on file  Social History Narrative   Lives at home with wife.       Patient is right handed.   Patient drinks 1-2 cups of caffeine daily.   Social Determinants of Health   Financial Resource Strain: Low Risk  (03/10/2022)   Overall Financial Resource Strain (CARDIA)    Difficulty of Paying Living Expenses: Not hard at all  Food Insecurity: No Food Insecurity (03/10/2022)   Hunger Vital Sign    Worried About Running Out of Food in the Last Year: Never true    Ran Out of Food in the Last Year: Never true  Transportation Needs: No Transportation Needs (03/10/2022)   PRAPARE - Hydrologist (Medical): No    Lack of Transportation (Non-Medical): No  Physical Activity: Sufficiently Active (03/10/2022)   Exercise Vital Sign    Days of Exercise per Week: 3 days    Minutes of Exercise per Session: 60 min  Stress: No Stress Concern Present (03/10/2022)   [place]    Feeling of Stress : Not at all  Social Connections: [place]  (03/10/2022)   Social Connection and Isolation Panel [NHANES]    Frequency of Communication with Friends and Family: More than three times a week    Frequency of Social Gatherings with Friends and Family: More than three times a week    Attends Religious Services: 1 to 4 times per year    Active Member of [employer] or Organizations: Yes    Attends Archivist  Meetings: More than 4 times per year    Marital Status: Married  Human resources officer Violence: Not At Risk (03/10/2022)   Humiliation, Afraid, Rape, and Kick questionnaire    Fear of Current or Ex-Partner: No    Emotionally Abused: No    Physically Abused: No    Sexually Abused: No   Family History  Problem Relation Age of Onset   Heart disease Mother    Prostate cancer Father    Heart disease Father    Stroke Father    Nephrolithiasis Sister    Cancer Sister        Intestinal   Hyperlipidemia Sister    Hyperlipidemia Sister    Cancer Sister        Sinus   Colon polyps Sister    Diabetes Brother    Colon polyps Brother        x2   COPD Other    Colon cancer Neg Hx    Esophageal cancer Neg Hx     Objective: Office vital signs reviewed. BP (!) 150/86   Pulse 74   Temp (!) 97.2 F (36.2 C)   Ht 6' (1.829 m)   Wt 183 lb (83 kg)   SpO2 98%   BMI 24.82 kg/m   Physical Examination:  General: Awake, alert, well nourished, No acute distress HEENT: Sclera white.  Moist mucous membranes.  No exophthalmos.  No goiter Cardio: regular rate and rhythm, S1S2 heard, no murmurs appreciated Pulm: clear to auscultation bilaterally, no wheezes, rhonchi or rales; normal work of breathing on room air MSK: Ambulating independently with normal gait and station but frequently stretching to relieve discomfort Neuro: No focal neurologic deficits Psych: Affect flat but very pleasant and interactive     07/16/2022   11:28 AM 03/10/2022    2:11 PM 02/18/2022   10:52 AM  Depression screen PHQ 2/9  Decreased Interest 0 0 0  Down,  Depressed, Hopeless 0 0 0  PHQ - 2 Score 0 0 0      07/16/2022   11:28 AM 02/18/2022   10:52 AM 11/23/2021    1:03 PM 04/16/2021   10:11 AM  GAD 7 : Generalized Anxiety Score  Nervous, Anxious, on Edge 0 0 1 0  Control/stop worrying 0 0 0 0  Worry too much - different things 0 0 0 0  Trouble relaxing 0 0 1 0  Restless 0 0 1 1  Easily annoyed or irritable 0 0 0 0  Afraid - awful might happen 0 0 0 0  Total GAD 7 Score 0 0 3 1  Anxiety Difficulty Not difficult at all Not difficult at all Somewhat difficult Somewhat difficult      Assessment/ Plan: 71 y.o. male   Subjective change in urination - Plan: PSA, CBC, Urinalysis  Benign prostatic hyperplasia without lower urinary tract symptoms - Plan: PSA  Syringomyelia and syringobulbia (HCC) - Plan: MR Brain W Wo Contrast  Cervical spondylosis without myelopathy - Plan: MR Brain W Wo Contrast  Essential hypertension - Plan: amLODipine (NORVASC) 10 MG tablet, losartan (COZAAR) 50 MG tablet  Stage 3a chronic kidney disease (HCC) - Plan: CMP14+EGFR, CBC, VITAMIN D 25 Hydroxy (Vit-D Deficiency, Fractures), losartan (COZAAR) 50 MG tablet  Pure hypercholesterolemia - Plan: rosuvastatin (CRESTOR) 10 MG tablet  Hypothyroidism due to acquired atrophy of thyroid - Plan: TSH, T4, Free, levothyroxine (SYNTHROID) 150 MCG tablet  Panic attack - Plan: CANCELED: ToxASSURE Select 13 (MW), Urine  Check PSA, CBC.  UA without  any infection or blood  MRI brain with contrast ordered as per request by [facility].  The patient will give me the exact details as to where I can fax this result to  Blood pressure is borderline but technically controlled for age.  No changes.  Renewal of medications have been sent  Not yet due for fasting lipid.  Continue statin  Check thyroid levels.  Synthroid renewed  Declined tox assure and controlled substance contract as he did not feel like he needed a renewal on the Ativan yet.  Continue Effexor  No orders of the  defined types were placed in this encounter.  No orders of the defined types were placed in this encounter.    Janora Norlander, DO [place] [phone]

## 2022-07-17 LAB — CMP14+EGFR
ALT: 24 IU/L (ref 0–44)
AST: 20 IU/L (ref 0–40)
Albumin/Globulin Ratio: 2.1 (ref 1.2–2.2)
Albumin: 4.6 g/dL (ref 3.9–4.9)
Alkaline Phosphatase: 106 IU/L (ref 44–121)
BUN/Creatinine Ratio: 9 — ABNORMAL LOW (ref 10–24)
BUN: 10 mg/dL (ref 8–27)
Bilirubin Total: 1 mg/dL (ref 0.0–1.2)
CO2: 24 mmol/L (ref 20–29)
Calcium: 9.6 mg/dL (ref 8.6–10.2)
Chloride: 100 mmol/L (ref 96–106)
Creatinine, Ser: 1.15 mg/dL (ref 0.76–1.27)
Globulin, Total: 2.2 g/dL (ref 1.5–4.5)
Glucose: 100 mg/dL — ABNORMAL HIGH (ref 70–99)
Potassium: 3.9 mmol/L (ref 3.5–5.2)
Sodium: 140 mmol/L (ref 134–144)
Total Protein: 6.8 g/dL (ref 6.0–8.5)
eGFR: 68 mL/min/{1.73_m2} (ref >59)

## 2022-07-17 LAB — PSA: Prostate Specific Ag, Serum: 0.2 ng/mL (ref 0.0–4.0)

## 2022-07-17 LAB — CBC
Hematocrit: 50.9 % (ref 37.5–51.0)
Hemoglobin: 17.6 g/dL (ref 13.0–17.7)
MCH: 34 pg — ABNORMAL HIGH (ref 26.6–33.0)
MCHC: 34.6 g/dL (ref 31.5–35.7)
MCV: 98 fL — ABNORMAL HIGH (ref 79–97)
Platelets: 254 10*3/uL (ref 150–450)
RBC: 5.18 x10E6/uL (ref 4.14–5.80)
RDW: 12.5 % (ref 11.6–15.4)
WBC: 5 10*3/uL (ref 3.4–10.8)

## 2022-07-17 LAB — T4, FREE: Free T4: 1.84 ng/dL — ABNORMAL HIGH (ref 0.82–1.77)

## 2022-07-17 LAB — VITAMIN D 25 HYDROXY (VIT D DEFICIENCY, FRACTURES): Vit D, 25-Hydroxy: 44.6 ng/mL (ref 30.0–100.0)

## 2022-07-17 LAB — TSH: TSH: 0.27 u[IU]/mL — ABNORMAL LOW (ref 0.450–4.500)

## 2022-07-19 ENCOUNTER — Other Ambulatory Visit: Payer: Self-pay | Admitting: Family Medicine

## 2022-07-19 DIAGNOSIS — E034 Atrophy of thyroid (acquired): Secondary | ICD-10-CM

## 2022-07-19 MED ORDER — LEVOTHYROXINE SODIUM 150 MCG PO TABS: ORAL_TABLET | ORAL | 3 refills | Status: DC

## 2022-07-30 ENCOUNTER — Telehealth: Payer: Medicare Other | Admitting: Physician Assistant

## 2022-07-30 DIAGNOSIS — J329 Chronic sinusitis, unspecified: Secondary | ICD-10-CM | POA: Diagnosis not present

## 2022-07-30 DIAGNOSIS — J40 Bronchitis, not specified as acute or chronic: Secondary | ICD-10-CM | POA: Diagnosis not present

## 2022-07-30 MED ORDER — PREDNISONE 20 MG PO TABS: 40.0000 mg | ORAL_TABLET | Freq: Every day | ORAL | 0 refills | Status: DC

## 2022-07-30 MED ORDER — BENZONATATE 100 MG PO CAPS: 100.0000 mg | ORAL_CAPSULE | Freq: Three times a day (TID) | ORAL | 0 refills | Status: DC | PRN

## 2022-07-30 MED ORDER — IPRATROPIUM BROMIDE 0.03 % NA SOLN: 2.0000 | Freq: Two times a day (BID) | NASAL | 0 refills | Status: DC

## 2022-07-30 NOTE — Progress Notes (Signed)
We are sorry that you are not feeling well.  Here is how we plan to help!  Based on your presentation I believe you most likely have A cough due to a virus.  This is called viral bronchitis and is best treated by rest, plenty of fluids and control of the cough.  You may use Ibuprofen or Tylenol as directed to help your symptoms.     In addition you may use A non-prescription cough medication called Mucinex DM: take 2 tablets every 12 hours. and A prescription cough medication called Tessalon Perles '100mg'$ . You may take 1-2 capsules every 8 hours as needed for your cough. I have also prescribed a nasal spray called Ipratropium Bromide Use 2 sprays in each nostril every 12 hours for congestion and drainage. This can be used with Nasacort type nasal spray.   Prednisone '20mg'$  Take 2 tablets daily with breakfast for 5 days  From your responses in the eVisit questionnaire you describe inflammation in the upper respiratory tract which is causing a significant cough.  This is commonly called Bronchitis and has four common causes:   Allergies Viral Infections Acid Reflux Bacterial Infection Allergies, viruses and acid reflux are treated by controlling symptoms or eliminating the cause. An example might be a cough caused by taking certain blood pressure medications. You stop the cough by changing the medication. Another example might be a cough caused by acid reflux. Controlling the reflux helps control the cough.  USE OF BRONCHODILATOR ("RESCUE") INHALERS: There is a risk from using your bronchodilator too frequently.  The risk is that over-reliance on a medication which only relaxes the muscles surrounding the breathing tubes can reduce the effectiveness of medications prescribed to reduce swelling and congestion of the tubes themselves.  Although you feel brief relief from the bronchodilator inhaler, your asthma may actually be worsening with the tubes becoming more swollen and filled with mucus.  This can  delay other crucial treatments, such as oral steroid medications. If you need to use a bronchodilator inhaler daily, several times per day, you should discuss this with your provider.  There are probably better treatments that could be used to keep your asthma under control.     HOME CARE Only take medications as instructed by your medical team. Complete the entire course of an antibiotic. Drink plenty of fluids and get plenty of rest. Avoid close contacts especially the very young and the elderly Cover your mouth if you cough or cough into your sleeve. Always remember to wash your hands A steam or ultrasonic humidifier can help congestion.   GET HELP RIGHT AWAY IF: You develop worsening fever. You become short of breath You cough up blood. Your symptoms persist after you have completed your treatment plan MAKE SURE YOU  Understand these instructions. Will watch your condition. Will get help right away if you are not doing well or get worse.    Thank you for choosing an e-visit.  Your e-visit answers were reviewed by a board certified advanced clinical practitioner to complete your personal care plan. Depending upon the condition, your plan could have included both over the counter or prescription medications.  Please review your pharmacy choice. Make sure the pharmacy is open so you can pick up prescription now. If there is a problem, you may contact your provider through [employer] and have the prescription routed to another pharmacy.  Your safety is important to [place]. If you have drug allergies check your prescription carefully.   For the  next 24 hours you can use MyChart to ask questions about today's visit, request a non-urgent call back, or ask for a work or school excuse. You will get an email in the next two days asking about your experience. I hope that your e-visit has been valuable and will speed your recovery.  I have spent 5 minutes in review of e-visit questionnaire,  review and updating patient chart, medical decision making and response to patient.   Mar Daring, PA-C

## 2022-08-12 ENCOUNTER — Ambulatory Visit (HOSPITAL_COMMUNITY)
Admission: RE | Admit: 2022-08-12 | Discharge: 2022-08-12 | Disposition: A | Discharge status: Home / Self Care | Payer: Medicare Other | Source: Ambulatory Visit | Attending: Family Medicine | Admitting: Family Medicine

## 2022-08-12 DIAGNOSIS — G950 Syringomyelia and syringobulbia: Secondary | ICD-10-CM | POA: Diagnosis not present

## 2022-08-12 DIAGNOSIS — M47812 Spondylosis without myelopathy or radiculopathy, cervical region: Secondary | ICD-10-CM | POA: Diagnosis not present

## 2022-08-12 DIAGNOSIS — I6782 Cerebral ischemia: Secondary | ICD-10-CM | POA: Diagnosis not present

## 2022-08-12 DIAGNOSIS — J329 Chronic sinusitis, unspecified: Secondary | ICD-10-CM | POA: Diagnosis not present

## 2022-08-12 MED ORDER — GADOBUTROL 1 MMOL/ML IV SOLN
9.0000 mL | Freq: Once | INTRAVENOUS | Status: AC | PRN
  Administered 2022-08-12: 9 mL via INTRAVENOUS

## 2022-08-17 ENCOUNTER — Encounter: Payer: Self-pay | Admitting: Family Medicine

## 2022-08-18 ENCOUNTER — Telehealth (INDEPENDENT_AMBULATORY_CARE_PROVIDER_SITE_OTHER): Payer: Medicare Other | Admitting: Nurse Practitioner

## 2022-08-18 ENCOUNTER — Encounter: Payer: Self-pay | Admitting: Nurse Practitioner

## 2022-08-18 DIAGNOSIS — J0100 Acute maxillary sinusitis, unspecified: Secondary | ICD-10-CM

## 2022-08-18 MED ORDER — AMOXICILLIN-POT CLAVULANATE 875-125 MG PO TABS: 1.0000 | ORAL_TABLET | Freq: Two times a day (BID) | ORAL | 0 refills | Status: DC

## 2022-08-18 MED ORDER — FLUTICASONE PROPIONATE 50 MCG/ACT NA SUSP: 2.0000 | Freq: Every day | NASAL | 6 refills | Status: DC

## 2022-08-18 NOTE — Patient Instructions (Signed)

## 2022-08-18 NOTE — Progress Notes (Signed)
   Virtual Visit  Note Due to COVID-19 pandemic this visit was conducted virtually. This visit type was conducted due to national recommendations for restrictions regarding the COVID-19 Pandemic (e.g. social distancing, sheltering in place) in an effort to limit this patient's exposure and mitigate transmission in our community. All issues noted in this document were discussed and addressed.  A physical exam was not performed with this format.  I connected with Jacob Kerr on 08/18/22 at 1:00 pm by telephone and verified that I am speaking with the correct person using two identifiers. Jacob Kerr is currently located at home during visit. The provider, Ivy Lynn, NP is located in their office at time of visit.  I discussed the limitations, risks, security and privacy concerns of performing an evaluation and management service by telephone and the availability of in person appointments. I also discussed with the patient that there may be a patient responsible charge related to this service. The patient expressed understanding and agreed to proceed.   History and Present Illness:  Sinusitis This is a new problem. Episode onset: in the past 3-5 days. The problem has been gradually worsening since onset. There has been no fever. Associated symptoms include congestion, ear pain and sinus pressure. Pertinent negatives include no chills, coughing, neck pain or sore throat. Past treatments include acetaminophen. The treatment provided no relief.      Review of Systems  Constitutional: Negative.  Negative for chills, fever, malaise/fatigue and weight loss.  HENT:  Positive for congestion, ear pain, sinus pressure and sinus pain. Negative for sore throat.   Respiratory:  Negative for cough.   Musculoskeletal:  Negative for neck pain.  Skin: Negative.  Negative for itching and rash.  All other systems reviewed and are negative.    Observations/Objective: Televisit patient not in  distress.  Assessment and Plan: Patient presents with maxillary sinusitis, with headache, head pressure, and jawline pain.  Advised patient to take meds as prescribed - Use a cool mist humidifier  -Use saline nose sprays frequently -Force fluids -For fever or aches or pains- take Tylenol or ibuprofen. -If symptoms do not improve, he may need to be COVID tested to rule this out -At home COVID-19 test negative.   Follow Up Instructions: Follow-up worsening unresolved symptoms.    I discussed the assessment and treatment plan with the patient. The patient was provided an opportunity to ask questions and all were answered. The patient agreed with the plan and demonstrated an understanding of the instructions.   The patient was advised to call back or seek an in-person evaluation if the symptoms worsen or if the condition fails to improve as anticipated.  The above assessment and management plan was discussed with the patient. The patient verbalized understanding of and has agreed to the management plan. Patient is aware to call the clinic if symptoms persist or worsen. Patient is aware when to return to the clinic for a follow-up visit. Patient educated on when it is appropriate to go to the emergency department.   Time call ended: 1:12 PM  I provided 12 minutes of  non face-to-face time during this encounter.    Ivy Lynn, NP

## 2022-08-27 ENCOUNTER — Other Ambulatory Visit: Payer: Self-pay | Admitting: Family Medicine

## 2022-08-29 ENCOUNTER — Other Ambulatory Visit: Payer: Self-pay | Admitting: Family Medicine

## 2022-08-29 DIAGNOSIS — K219 Gastro-esophageal reflux disease without esophagitis: Secondary | ICD-10-CM

## 2022-10-06 ENCOUNTER — Encounter: Payer: Self-pay | Admitting: Family Medicine

## 2022-10-06 ENCOUNTER — Encounter: Payer: Self-pay | Admitting: Nurse Practitioner

## 2022-10-12 ENCOUNTER — Encounter: Payer: Self-pay | Admitting: Family Medicine

## 2022-10-12 DIAGNOSIS — F410 Panic disorder [episodic paroxysmal anxiety] without agoraphobia: Secondary | ICD-10-CM

## 2022-10-26 ENCOUNTER — Ambulatory Visit: Payer: Medicare Other | Admitting: Nurse Practitioner

## 2022-10-27 NOTE — Telephone Encounter (Signed)
Pt contacted for a symptom update in regard to my chart message sent by pt. Pt stated that he is feeling better, had a BM last  night, no abdominal pain, no nausea. Pt encouraged to keep Office Visit that is scheduled: Pt verbalized understanding with all questions answered.

## 2022-11-11 ENCOUNTER — Other Ambulatory Visit: Payer: Self-pay | Admitting: Family Medicine

## 2022-11-11 DIAGNOSIS — K219 Gastro-esophageal reflux disease without esophagitis: Secondary | ICD-10-CM

## 2022-11-19 ENCOUNTER — Ambulatory Visit: Payer: Medicare HMO | Admitting: Nurse Practitioner

## 2022-11-19 ENCOUNTER — Other Ambulatory Visit (INDEPENDENT_AMBULATORY_CARE_PROVIDER_SITE_OTHER): Payer: Medicare HMO

## 2022-11-19 ENCOUNTER — Encounter: Payer: Self-pay | Admitting: Nurse Practitioner

## 2022-11-19 ENCOUNTER — Telehealth: Payer: Self-pay

## 2022-11-19 VITALS — BP 130/84 | HR 83 | Ht 72.0 in | Wt 185.0 lb

## 2022-11-19 DIAGNOSIS — R1032 Left lower quadrant pain: Secondary | ICD-10-CM

## 2022-11-19 DIAGNOSIS — K625 Hemorrhage of anus and rectum: Secondary | ICD-10-CM

## 2022-11-19 LAB — COMPREHENSIVE METABOLIC PANEL
ALT: 20 U/L (ref 0–53)
AST: 16 U/L (ref 0–37)
Albumin: 4.6 g/dL (ref 3.5–5.2)
Alkaline Phosphatase: 93 U/L (ref 39–117)
BUN: 16 mg/dL (ref 6–23)
CO2: 29 mEq/L (ref 19–32)
Calcium: 9.6 mg/dL (ref 8.4–10.5)
Chloride: 101 mEq/L (ref 96–112)
Creatinine, Ser: 1.18 mg/dL (ref 0.40–1.50)
GFR: 62.22 mL/min (ref >60.00)
Glucose, Bld: 110 mg/dL — ABNORMAL HIGH (ref 70–99)
Potassium: 3.8 mEq/L (ref 3.5–5.1)
Sodium: 139 mEq/L (ref 135–145)
Total Bilirubin: 1.2 mg/dL (ref 0.2–1.2)
Total Protein: 6.9 g/dL (ref 6.0–8.3)

## 2022-11-19 LAB — CBC WITH DIFFERENTIAL/PLATELET
Basophils Absolute: 0 10*3/uL (ref 0.0–0.1)
Basophils Relative: 0.8 % (ref 0.0–3.0)
Eosinophils Absolute: 0.3 10*3/uL (ref 0.0–0.7)
Eosinophils Relative: 4.7 % (ref 0.0–5.0)
HCT: 48.9 % (ref 39.0–52.0)
Hemoglobin: 17.2 g/dL — ABNORMAL HIGH (ref 13.0–17.0)
Lymphocytes Relative: 26.1 % (ref 12.0–46.0)
Lymphs Abs: 1.4 10*3/uL (ref 0.7–4.0)
MCHC: 35.2 g/dL (ref 30.0–36.0)
MCV: 97.9 fl (ref 78.0–100.0)
Monocytes Absolute: 0.6 10*3/uL (ref 0.1–1.0)
Monocytes Relative: 11 % (ref 3.0–12.0)
Neutro Abs: 3.1 10*3/uL (ref 1.4–7.7)
Neutrophils Relative %: 57.4 % (ref 43.0–77.0)
Platelets: 229 10*3/uL (ref 150.0–400.0)
RBC: 5 Mil/uL (ref 4.22–5.81)
RDW: 14.2 % (ref 11.5–15.5)
WBC: 5.3 10*3/uL (ref 4.0–10.5)

## 2022-11-19 LAB — C-REACTIVE PROTEIN: CRP: <1 mg/dL (ref 0.5–20.0)

## 2022-11-19 MED ORDER — CIPROFLOXACIN HCL 500 MG PO TABS: 500.0000 mg | ORAL_TABLET | Freq: Two times a day (BID) | ORAL | 0 refills | Status: DC

## 2022-11-19 MED ORDER — METRONIDAZOLE 500 MG PO TABS: 500.0000 mg | ORAL_TABLET | Freq: Two times a day (BID) | ORAL | 0 refills | Status: DC

## 2022-11-19 NOTE — Telephone Encounter (Signed)
Spoke pt and informed pt that CT was moved to [facility] for Sunday 11/21/22 at 1 pm, arrive at 11 am

## 2022-11-19 NOTE — Progress Notes (Signed)
____________________________________________________________  Attending physician addendum:  Thank you for sending this case to me. I have reviewed the entire note and agree with the plan.  Yes, a sooner CT scan would be ideal.  Wilfrid Lund, MD  ____________________________________________________________

## 2022-11-19 NOTE — Patient Instructions (Signed)
You have been scheduled for a CT scan of the abdomen and pelvis at [facility], 1st floor Radiology. You are scheduled on 12/03/22 at 4:30 pm. You should arrive 2 hours prior to your appointment time for registration.  If you have any questions regarding your exam or if you need to reschedule, you may call [facility] at [phone] between the hours of 8:00 am and 5:00 pm, Monday-Friday.    We have sent the following medications to your pharmacy for you to pick up at your convenience: Flagyl 500 mg & Cipro 500 mg   Due to recent changes in healthcare laws, you may see the results of your imaging and laboratory studies on MyChart before your provider has had a chance to review them.  We understand that in some cases there may be results that are confusing or concerning to you. Not all laboratory results come back in the same time frame and the provider may be waiting for multiple results in order to interpret others.  Please give [place] 48 hours in order for your provider to thoroughly review all the results before contacting the office for clarification of your results.    Thank you for trusting me with your gastrointestinal care!   Carl Best, CRNP

## 2022-11-19 NOTE — Progress Notes (Signed)
11/19/2022 CHANON CROES SU:[id number] [birth year]   CHIEF COMPLAINT: Irregular BMs  HISTORY OF PRESENT ILLNESS: Anyelo R. Wallack is a 72 year old male with a past medical history of anxiety, hypertension, TIA, hypothyroidism, peripheral neuropathy, chronic back pain, syringomyelia, vitamin D deficiency, kidney stones, diverticulitis 2016. Past cholecystectomy. He presents to our office today as referred by with complaints of LLQ pain which started approximately 2 months ago and has progressively worsened since then. He sometimes feels a knot to the LLQ area.  His bowel pattern has also changed within the past month. He is passing small bits of stool with a lot of water per the rectum.  Stools are sometimes pencil thin. He passed 3 red bloody bowel movements over the past month.  No black stools.  No anorectal pain. He is experiencing lower back pain which is somewhat different from his chronic back issues. He endorses unintentionally losing 20 pounds over the past 6 months. No fever, sweats or chills. No known family history of IBD or colorectal cancer.  His sister had a "vine like cancer growth " outside of her intestines. His most recent colonoscopy was 12/30/2014 which identified severe diverticulosis to the descending and sigmoid colon with mild diverticulosis to the transverse colon and internal hemorrhoids. A sigmoidoscopy by Dr. Loletha Carrow 02/02/2016 showed diverticulosis in the left colon.  At that time, he was treated with a course of Augmentin for suspected low-grade diverticulitis.  Prior history of diverticulitis with a possible contained microperforation in 2016.  No GERD symptoms on Omeprazole 20 mg twice daily.  No recent antibiotics.     Latest Ref Rng & Units 07/16/2022   11:52 AM 11/24/2021    8:34 AM 12/09/2020    8:33 AM  CBC  WBC 3.4 - 10.8 x10E3/uL 5.0  5.2  4.9   Hemoglobin 13.0 - 17.7 g/dL 17.6  17.6  17.7   Hematocrit 37.5 - 51.0 % 50.9  51.1  52.7   Platelets 150 - 450 x10E3/uL  254  221  222        Latest Ref Rng & Units 07/16/2022   11:52 AM 11/24/2021    8:34 AM 04/16/2021   10:25 AM  CMP  Glucose 70 - 99 mg/dL 100  118  110   BUN 8 - 27 mg/dL '10  12  13   '$ Creatinine 0.76 - 1.27 mg/dL 1.15  1.19  1.20   Sodium 134 - 144 mmol/L 140  138  141   Potassium 3.5 - 5.2 mmol/L 3.9  4.5  4.2   Chloride 96 - 106 mmol/L 100  99  102   CO2 20 - 29 mmol/L '24  27  25   '$ Calcium 8.6 - 10.2 mg/dL 9.6  9.5  9.4   Total Protein 6.0 - 8.5 g/dL 6.8  6.7    Total Bilirubin 0.0 - 1.2 mg/dL 1.0  1.1    Alkaline Phos 44 - 121 IU/L 106  120    AST 0 - 40 IU/L 20  17    ALT 0 - 44 IU/L 24  21       Colonoscopy 12/30/2014 by Dr. Deatra Ina: Severe diverticulosis to the descending and sigmoid colon Mild diverticulosis transverse colon Internal hemorrhoids  Sigmoidoscopy due to LLQ pain 02/02/2016 by Dr. Loletha Carrow: Diverticulosis in the left colon Internal hemorrhoids (which are the source of recent bleeding) The examination was otherwise normal No specimens collected No evidence of SCAD. Treated with Augmentin for suspected low grade  diverticulitis per CT  Past Medical History:  Diagnosis Date   Acute meniscal tear of left knee    Allergic rhinitis    Anal fissure    Anxiety    BPH (benign prostatic hypertrophy)    Carpal tunnel syndrome of right wrist    Cervical spondylosis without myelopathy    Cervicogenic headache    Chronic headaches    Chronic neck pain    uses TENS unit  prn   Diverticulitis    Gallstones    GERD (gastroesophageal reflux disease)    H/O hiatal hernia    History of diverticulitis    History of kidney stones    History of TIA (transient ischemic attack)    04/ 2011---  no residuals   Hypertension    Hypothyroidism    Peripheral neuropathy    Shingles    Syrinx of spinal cord ([place])    C4 -- C7   Past Surgical History:  Procedure Laterality Date   CARDIAC CATHETERIZATION  02-18-2010   dr Angelena Form   normal coronary arteries/  normal lvsf--  ef  65%   COLONOSCOPY     [place]  2018   [facility]   KNEE ARTHROSCOPY Left 03/13/2014   Procedure: LEFT ARTHROSCOPY KNEE WITH DEBRIDMENT;  Surgeon: Gearlean Alf, MD;  Location: [facility];  Service: Orthopedics;  Laterality: Left;   LAPAROSCOPIC CHOLECYSTECTOMY  01/30/2003   NEGATIVE SLEEP STUDY  2013   per pt   RADIOFREQUENCY ABLATION NERVES  05/2016   REMOVAL FORGEIN BODY FROM EAR  AGE 89   RIGHT URETEROSCOPIC LASER LITHOTRIPSY STONE EXTRACTION /  STENT PLACEMENT  05/10/2000   TRANSTHORACIC ECHOCARDIOGRAM  01/16/2010   normal lvf/  ef  60%/  mild lae   Social History: He is married.  Retired Occupational hygienist.  He has 2 daughters.  Non-smoker.  He drinks 2 alcoholic beverages daily.  No drug use.  Family History: family history includes COPD in an other family member; Cancer in his sister and sister; Colon polyps in his brother and sister; Diabetes in his brother; Heart disease in his father and mother; Hyperlipidemia in his sister and sister; Nephrolithiasis in his sister; Prostate cancer in his father; Stroke in his father.  Allergies  Allergen Reactions   Cymbalta [Duloxetine Hcl] Other (See Comments)    Depression   Lyrica [Pregabalin] Other (See Comments)    Paranoid   Neurontin [Gabapentin] Other (See Comments)    nightmares   Robaxin [Methocarbamol] Other (See Comments)    Seeing "bright lights"   Sudafed [Pseudoephedrine Hcl] Other (See Comments)    Increased heart rate   Epinephrine Palpitations    Decreased blood pressure      Outpatient Encounter Medications as of 11/19/2022  Medication Sig   amLODipine (NORVASC) 10 MG tablet Take 1 tablet (10 mg total) by mouth daily.   amoxicillin-clavulanate (AUGMENTIN) 875-125 MG tablet Take 1 tablet by mouth 2 (two) times daily.   aspirin 81 MG tablet Take 81 mg by mouth at bedtime.   azelastine (ASTELIN) 0.1 % nasal spray PLACE 2 SPRAYS INTO BOTH NOSTRILS 2  (TWO) TIMES DAILY.   baclofen (LIORESAL) 10 MG tablet Take 1 tablet (10 mg total) by mouth 3 (three) times daily as needed for muscle spasms.   benzonatate (TESSALON) 100 MG capsule Take 1 capsule (100 mg total) by mouth 3 (three) times daily as needed.   Coenzyme Q10 (CO Q 10) 100  MG CAPS Take 1 capsule by mouth daily.   fluticasone (FLONASE) 50 MCG/ACT nasal spray Place 2 sprays into both nostrils daily.   ipratropium (ATROVENT) 0.03 % nasal spray Place 2 sprays into both nostrils every 12 (twelve) hours.   levothyroxine (SYNTHROID) 150 MCG tablet TAKE 1 TABLET BY MOUTH DAILY  BEFORE BREAKFAST EXCEPT ONE-HALF TABLET BY MOUTH ON Saturday and SUNDAY   LORazepam (ATIVAN) 0.5 MG tablet Take 1 tablet (0.5 mg total) by mouth 2 (two) times daily as needed for anxiety.   losartan (COZAAR) 50 MG tablet Take 1 tablet (50 mg total) by mouth daily.   NON FORMULARY Take 3 capsules by mouth every morning. Nugenix Testosterone Booster / [employer]   omeprazole (PRILOSEC) 20 MG capsule TAKE 1 CAPSULE BY MOUTH TWICE  DAILY   predniSONE (DELTASONE) 20 MG tablet Take 2 tablets (40 mg total) by mouth daily with breakfast.   rosuvastatin (CRESTOR) 10 MG tablet Take 1 tablet (10 mg total) by mouth daily.   venlafaxine XR (EFFEXOR-XR) 75 MG 24 hr capsule Take 1 capsule (75 mg total) by mouth daily with breakfast.   Vitamin D, Ergocalciferol, (DRISDOL) 1.25 MG (50000 UNIT) CAPS capsule TAKE 1 CAPSULE (50,000 UNITS TOTAL) BY MOUTH TWO TIMES A WEEK   zinc gluconate 50 MG tablet Take 50 mg by mouth daily.   No facility-administered encounter medications on file as of 11/19/2022.   REVIEW OF SYSTEMS:  Gen: See HPI. CV: Denies chest pain, palpitations or edema. Resp: + Cough. No shortness of breath of hemoptysis.  GI: Denies heartburn, dysphagia, stomach or lower abdominal pain. No diarrhea or constipation.  GU : Denies urinary burning, blood in urine, increased urinary frequency or incontinence. MS:+ Back pain. Derm: Denies  rash, itchiness, skin lesions or unhealing ulcers. Psych: Denies depression, anxiety, memory loss or confusion. Heme: Denies bruising, easy bleeding. Neuro:  + Headaches. Endo:  Denies any problems with DM, thyroid or adrenal function.  PHYSICAL EXAM: BP 130/84   Pulse 83   Ht 6' (1.829 m)   Wt 185 lb (83.9 kg)   BMI 25.09 kg/m   General: 72 year old male in no acute distress. Head: Normocephalic and atraumatic. Eyes: Sclerae non-icteric, conjunctive pink. Ears: Normal auditory acuity. Mouth: Dentition intact. No ulcers or lesions.  Neck: Supple, no lymphadenopathy or thyromegaly.  Lungs: Clear bilaterally to auscultation without wheezes, crackles or rhonchi. Heart: Regular rate and rhythm. No murmur, rub or gallop appreciated.  Abdomen: Soft, nondistended.  Moderate LLQ tenderness without rebound or guarding.  No masses. No hepatosplenomegaly. Normoactive bowel sounds x 4 quadrants.  Rectal: Deferred. Musculoskeletal: + Back pain.  Skin: Warm and dry. No rash or lesions on visible extremities. Extremities: No edema. Neurological: Alert oriented x 4, no focal deficits.  Psychological:  Alert and cooperative. Normal mood and affect.  ASSESSMENT AND PLAN:  58) 72 year old male with a history of severe diverticulosis and diverticulitis with possible contained microperforation in 2016 with acute on chronic LLQ pain x 2 months which has progressively worsened over the past month associated with 3 bloody stools. -CBC, CMP and CRP -CTAP with oral and IV contrast asap to rule out diverticulitis or other intra-abdominal/pelvic pathology to explain his symptoms. BUN/Cr levels to be reviewed prior to patient receiving IV contrast. -Cipro 500 mg 1 p.o. twice daily and Flagyl 500 mg 1 p.o. twice daily for 10 days for suspected diverticulitis/colitis -Push fluids, bland diet -May need to check for C. difficile, await CT and lab results -Patient instructed go to  the ED if abdominal pain  worsens  2) GERD, stable on PPI bid -Continue omeprazole 20 mg twice daily  3) Unexplained 20 pound weight loss over the past 6 months -CTAP and labs as ordered above    CC:  Janora Norlander, DO

## 2022-11-21 ENCOUNTER — Ambulatory Visit (HOSPITAL_BASED_OUTPATIENT_CLINIC_OR_DEPARTMENT_OTHER)
Admission: RE | Admit: 2022-11-21 | Discharge: 2022-11-21 | Disposition: A | Discharge status: Home / Self Care | Payer: Medicare HMO | Source: Ambulatory Visit | Attending: Nurse Practitioner | Admitting: Nurse Practitioner

## 2022-11-21 DIAGNOSIS — R1032 Left lower quadrant pain: Secondary | ICD-10-CM | POA: Diagnosis present

## 2022-11-21 DIAGNOSIS — K625 Hemorrhage of anus and rectum: Secondary | ICD-10-CM | POA: Insufficient documentation

## 2022-11-21 MED ORDER — IOHEXOL 300 MG/ML  SOLN
100.0000 mL | Freq: Once | INTRAMUSCULAR | Status: AC | PRN
  Administered 2022-11-21: 100 mL via INTRAVENOUS

## 2022-11-24 NOTE — Telephone Encounter (Signed)
See CTAP notes

## 2022-11-28 ENCOUNTER — Encounter: Payer: Self-pay | Admitting: Family Medicine

## 2022-12-03 ENCOUNTER — Ambulatory Visit (HOSPITAL_COMMUNITY): Payer: Medicare HMO

## 2022-12-16 ENCOUNTER — Ambulatory Visit (HOSPITAL_COMMUNITY): Payer: Medicare HMO | Admitting: Psychiatry

## 2022-12-16 ENCOUNTER — Encounter (HOSPITAL_COMMUNITY): Payer: Self-pay | Admitting: Psychiatry

## 2022-12-16 DIAGNOSIS — G479 Sleep disorder, unspecified: Secondary | ICD-10-CM

## 2022-12-16 DIAGNOSIS — F4310 Post-traumatic stress disorder, unspecified: Secondary | ICD-10-CM | POA: Diagnosis not present

## 2022-12-16 DIAGNOSIS — G8929 Other chronic pain: Secondary | ICD-10-CM

## 2022-12-16 DIAGNOSIS — R634 Abnormal weight loss: Secondary | ICD-10-CM | POA: Diagnosis not present

## 2022-12-16 DIAGNOSIS — F1290 Cannabis use, unspecified, uncomplicated: Secondary | ICD-10-CM

## 2022-12-16 MED ORDER — VENLAFAXINE HCL ER 75 MG PO CP24: 75.0000 mg | ORAL_CAPSULE | Freq: Every day | ORAL | 3 refills | Status: DC

## 2022-12-16 NOTE — Patient Instructions (Signed)
We didn't make any medication changes today. If you are interested in the future, reach out to your insurer to find a psychotherapist for CBT that is in network to begin processing the past trauma.
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
## 2022-12-24 ENCOUNTER — Other Ambulatory Visit: Payer: Self-pay | Admitting: Family Medicine

## 2022-12-24 DIAGNOSIS — M47812 Spondylosis without myelopathy or radiculopathy, cervical region: Secondary | ICD-10-CM

## 2023-01-25 ENCOUNTER — Encounter: Payer: Self-pay | Admitting: Family Medicine

## 2023-02-15 ENCOUNTER — Encounter (HOSPITAL_COMMUNITY): Payer: Self-pay | Admitting: Psychiatry

## 2023-02-15 ENCOUNTER — Telehealth (INDEPENDENT_AMBULATORY_CARE_PROVIDER_SITE_OTHER): Payer: Medicare HMO | Admitting: Psychiatry

## 2023-02-15 DIAGNOSIS — G479 Sleep disorder, unspecified: Secondary | ICD-10-CM

## 2023-02-15 DIAGNOSIS — F1290 Cannabis use, unspecified, uncomplicated: Secondary | ICD-10-CM | POA: Diagnosis not present

## 2023-02-15 DIAGNOSIS — F4310 Post-traumatic stress disorder, unspecified: Secondary | ICD-10-CM

## 2023-02-15 DIAGNOSIS — R634 Abnormal weight loss: Secondary | ICD-10-CM
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
## 2023-03-16 ENCOUNTER — Ambulatory Visit: Payer: Medicare HMO

## 2023-03-16 ENCOUNTER — Encounter: Payer: Self-pay | Admitting: Family Medicine

## 2023-03-16 VITALS — Ht 72.0 in | Wt 195.0 lb

## 2023-03-16 DIAGNOSIS — Z0000 Encounter for general adult medical examination without abnormal findings: Secondary | ICD-10-CM | POA: Diagnosis not present

## 2023-03-16 NOTE — Progress Notes (Signed)
Subjective:   Jacob Kerr is a 72 y.o. male who presents for Medicare Annual/Subsequent preventive examination.  Visit Complete: Virtual  I connected with  [name] on 03/16/23 by a audio enabled telemedicine application and verified that I am speaking with the correct person using two identifiers.  Patient Location: Home  Provider Location: Home Office  I discussed the limitations of evaluation and management by telemedicine. The patient expressed understanding and agreed to proceed.  Patient Medicare AWV questionnaire was completed by the patient on 03/16/2023; I have confirmed that all information answered by patient is correct and no changes since this date.  Review of Systems     Cardiac Risk Factors include: advanced age (>14men, >5 women);male gender;hypertension;dyslipidemia     Objective:    Today's Vitals   03/16/23 1417  Weight: 195 lb (88.5 kg)  Height: 6' (1.829 m)   Body mass index is 26.45 kg/m.     03/16/2023    2:21 PM 03/10/2022    2:13 PM 02/09/2021    2:14 PM 04/03/2020    9:36 AM 02/07/2020    2:36 PM 01/22/2020   10:13 AM 11/08/2019   12:19 PM  Advanced Directives  Does Patient Have a Medical Advance Directive? No No No No No No No  Would patient like information on creating a medical advance directive? No - Patient declined No - Patient declined No - Patient declined No - Patient declined No - Patient declined No - Patient declined Yes (MAU/Ambulatory/Procedural Areas - Information given)    Current Medications (verified) Outpatient Encounter Medications as of 03/16/2023  Medication Sig   amLODipine (NORVASC) 10 MG tablet Take 1 tablet (10 mg total) by mouth daily.   aspirin 81 MG tablet Take 81 mg by mouth at bedtime.   azelastine (ASTELIN) 0.1 % nasal spray PLACE 2 SPRAYS INTO BOTH NOSTRILS 2 (TWO) TIMES DAILY.   Coenzyme Q10 (CO Q 10) 100 MG CAPS Take 1 capsule by mouth daily.   fluticasone (FLONASE) 50 MCG/ACT nasal spray Place 2  sprays into both nostrils daily.   ipratropium (ATROVENT) 0.03 % nasal spray Place 2 sprays into both nostrils every 12 (twelve) hours.   levothyroxine (SYNTHROID) 150 MCG tablet TAKE 1 TABLET BY MOUTH DAILY  BEFORE BREAKFAST EXCEPT ONE-HALF TABLET BY MOUTH ON Saturday and SUNDAY   losartan (COZAAR) 50 MG tablet Take 1 tablet (50 mg total) by mouth daily.   NON FORMULARY Take 3 capsules by mouth every morning. Nugenix Testosterone Booster / [employer]   omeprazole (PRILOSEC) 20 MG capsule TAKE 1 CAPSULE BY MOUTH TWICE  DAILY   venlafaxine XR (EFFEXOR-XR) 75 MG 24 hr capsule Take 1 capsule (75 mg total) by mouth daily with breakfast.   Vitamin D, Ergocalciferol, (DRISDOL) 1.25 MG (50000 UNIT) CAPS capsule TAKE 1 CAPSULE (50,000 UNITS TOTAL) BY MOUTH TWO TIMES A WEEK   zinc gluconate 50 MG tablet Take 50 mg by mouth daily.   No facility-administered encounter medications on file as of 03/16/2023.    Allergies (verified) Cymbalta [duloxetine hcl], Lyrica [pregabalin], Neurontin [gabapentin], Robaxin [methocarbamol], Sudafed [pseudoephedrine hcl], and Epinephrine   History: Past Medical History:  Diagnosis Date   Acute meniscal tear of left knee    Allergic rhinitis    Anal fissure    Anxiety    BPH (benign prostatic hypertrophy)    Carpal tunnel syndrome of right wrist    Cervical spondylosis without myelopathy    Cervicogenic headache    Chronic headaches  Chronic neck pain    uses TENS unit  prn   Diverticulitis    Gallstones    GERD (gastroesophageal reflux disease)    H/O hiatal hernia    History of diverticulitis    History of kidney stones    History of TIA (transient ischemic attack)    04/ 2011---  no residuals   Hypertension    Hypothyroidism    Peripheral neuropathy    Shingles    Syrinx of spinal cord (HCC)    C4 -- C7   Past Surgical History:  Procedure Laterality Date   CARDIAC CATHETERIZATION  02-18-2010   dr Clifton James   normal coronary arteries/  normal lvsf--  ef  65%   COLONOSCOPY     HEMORRHOID SURGERY  1992   HERNIA REPAIR  2018   [facility]   KNEE ARTHROSCOPY Left 03/13/2014   Procedure: LEFT ARTHROSCOPY KNEE WITH DEBRIDMENT;  Surgeon: Loanne Drilling, MD;  Location: [facility];  Service: Orthopedics;  Laterality: Left;   LAPAROSCOPIC CHOLECYSTECTOMY  01/30/2003   NEGATIVE SLEEP STUDY  2013   per pt   RADIOFREQUENCY ABLATION NERVES  05/2016   REMOVAL FORGEIN BODY FROM EAR  AGE 41   RIGHT URETEROSCOPIC LASER LITHOTRIPSY STONE EXTRACTION /  STENT PLACEMENT  05/10/2000   TRANSTHORACIC ECHOCARDIOGRAM  01/16/2010   normal lvf/  ef  60%/  mild lae   Family History  Problem Relation Age of Onset   Heart disease Mother    Prostate cancer Father    Heart disease Father    Stroke Father    Nephrolithiasis Sister    Cancer Sister        Intestinal   Hyperlipidemia Sister    Hyperlipidemia Sister    Cancer Sister        Sinus   Colon polyps Sister    Diabetes Brother    Colon polyps Brother        x2   COPD Other    Colon cancer Neg Hx    Esophageal cancer Neg Hx    Social History   Socioeconomic History   Marital status: Married    Spouse name: [name]   Number of children: 2   Years of education: 12   Highest education level: 12th grade  Occupational History   Occupation: Retired    Associate Professor: Lexicographer  Tobacco Use   Smoking status: Never   Smokeless tobacco: Never  Vaping Use   Vaping Use: Never used  Substance and Sexual Activity   Alcohol use: Yes    Alcohol/week: 7.0 standard drinks of alcohol    Types: 7 Shots of liquor per week    Comment: Quit half gallon of liquor per week on 12/09/22. Trying to have 1 beer or less nightly as of 02/15/23   Drug use: Yes    Types: Marijuana    Comment: started use after retiring, usually two puffs per day for back pain. Cutting back as of 02/15/23   Sexual activity: Not on file  Other Topics Concern   Not on file  Social History Narrative   Lives at  home with wife.       Patient is right handed.   Patient drinks 1-2 cups of caffeine daily.   Social Determinants of Health   Financial Resource Strain: Low Risk  (03/16/2023)   Overall Financial Resource Strain (CARDIA)    Difficulty of Paying Living Expenses: Not hard at all  Food Insecurity: No Food Insecurity (  03/16/2023)   Hunger Vital Sign    Worried About Running Out of Food in the Last Year: Never true    Ran Out of Food in the Last Year: Never true  Transportation Needs: No Transportation Needs (03/16/2023)   PRAPARE - Administrator, Civil Service (Medical): No    Lack of Transportation (Non-Medical): No  Physical Activity: Sufficiently Active (03/16/2023)   Exercise Vital Sign    Days of Exercise per Week: 5 days    Minutes of Exercise per Session: 30 min  Stress: No Stress Concern Present (03/16/2023)   [employer] of Occupational Health - Occupational Stress Questionnaire    Feeling of Stress : Not at all  Social Connections: Moderately Integrated (03/16/2023)   Social Connection and Isolation Panel [NHANES]    Frequency of Communication with Friends and Family: More than three times a week    Frequency of Social Gatherings with Friends and Family: More than three times a week    Attends Religious Services: 1 to 4 times per year    Active Member of [employer] or Organizations: No    Attends Engineer, structural: Never    Marital Status: Married    Tobacco Counseling Counseling given: Not Answered   Clinical Intake:  Pre-visit preparation completed: Yes  Pain : No/denies pain     Nutritional Risks: None Diabetes: No  How often do you need to have someone help you when you read instructions, pamphlets, or other written materials from your doctor or pharmacy?: 1 - Never  Interpreter Needed?: No  Information entered by :: Renie Ora, LPN   Activities of Daily Living    03/16/2023    2:21 PM  In your present state of health, do you  have any difficulty performing the following activities:  Hearing? 0  Vision? 0  Difficulty concentrating or making decisions? 0  Walking or climbing stairs? 0  Dressing or bathing? 0  Doing errands, shopping? 0  Preparing Food and eating ? N  Using the Toilet? N  In the past six months, have you accidently leaked urine? N  Do you have problems with loss of bowel control? N  Managing your Medications? N  Managing your Finances? N  Housekeeping or managing your Housekeeping? N    Patient Care Team: Raliegh Ip, DO as PCP - General (Family Medicine) Shellia Carwin., MD (Neurosurgery) Mardella Layman, MD (Gastroenterology) York Spaniel, MD (Inactive) (Neurology) Elvis Coil, MD (Nephrology) Rollene Rotunda, MD as Consulting Physician (Cardiology) Raliegh Ip, DO as Consulting Physician (Family Medicine)  Indicate any recent Medical Services you may have received from other than [facility] providers in the past year (date may be approximate).     Assessment:   This is a routine wellness examination for Kendyn.  Hearing/Vision screen Vision Screening - Comments:: Wears rx glasses - up to date with routine eye exams with  Dr,Johnson   Dietary issues and exercise activities discussed:     Goals Addressed             This Visit's Progress    Patient Stated   On track    02/07/2020 AWV Goal: Fall Prevention  Over the next year, patient will decrease their risk for falls by: Using assistive devices, such as a cane or walker, as needed Identifying fall risks within their home and correcting them by: Removing throw rugs Adding handrails to stairs or ramps Removing clutter and keeping a clear pathway throughout the  home Increasing light, especially at night Adding shower handles/bars Raising toilet seat Identifying potential personal risk factors for falls: Medication side effects Incontinence/urgency Vestibular dysfunction Hearing  loss Musculoskeletal disorders Neurological disorders Orthostatic hypotension         Depression Screen    03/16/2023    2:20 PM 12/16/2022   11:24 AM 07/16/2022   11:28 AM 03/10/2022    2:11 PM 02/18/2022   10:52 AM 11/23/2021    1:03 PM 04/16/2021   10:10 AM  PHQ 2/9 Scores  PHQ - 2 Score 0  0 0 0 0 1  PHQ- 9 Score      3 7     Information is confidential and restricted. Go to Review Flowsheets to unlock data.    Fall Risk    03/16/2023    2:18 PM 07/16/2022   11:28 AM 03/10/2022    2:15 PM 02/18/2022   10:52 AM 11/23/2021    1:03 PM  Fall Risk   Falls in the past year? 0 0 1 1 1   Number falls in past yr: 0  1 1 1   Injury with Fall? 0  0 0 0  Risk for fall due to : No Fall Risks  History of fall(s);Impaired balance/gait History of fall(s) History of fall(s)  Follow up Falls prevention discussed  Falls prevention discussed Education provided Education provided    MEDICARE RISK AT HOME:  Medicare Risk at Home - 03/16/23 1418     Any stairs in or around the home? Yes    If so, are there any without handrails? No    Home free of loose throw rugs in walkways, pet beds, electrical cords, etc? Yes    Adequate lighting in your home to reduce risk of falls? Yes    Life alert? No    Use of a cane, walker or w/c? No    Grab bars in the bathroom? Yes    Shower chair or bench in shower? Yes    Elevated toilet seat or a handicapped toilet? Yes             TIMED UP AND GO:  Was the test performed?  No    Cognitive Function:        03/16/2023    2:21 PM 03/10/2022    2:19 PM 02/07/2020    2:39 PM 02/06/2019    3:58 PM  6CIT Screen  What Year? 0 points 0 points 0 points 0 points  What month? 0 points 0 points 0 points 0 points  What time? 0 points 0 points 0 points 0 points  Count back from 20 0 points 0 points 0 points 0 points  Months in reverse 0 points 2 points 0 points 2 points  Repeat phrase 0 points 0 points 0 points 0 points  Total Score 0 points 2 points 0  points 2 points    Immunizations Immunization History  Administered Date(s) Administered   Influenza Whole 06/27/2012   Influenza, High Dose Seasonal PF 07/21/2018   Influenza,inj,Quad PF,6+ Mos 07/04/2013, 07/12/2014, 07/31/2015, 08/18/2016   Influenza-Unspecified 07/13/2017   Pneumococcal Conjugate-13 03/07/2018   Pneumococcal Polysaccharide-23 03/28/2019   Tdap 09/27/2005, 06/23/2011   Zoster, Live 05/20/2014    TDAP status: Due, Education has been provided regarding the importance of this vaccine. Advised may receive this vaccine at local pharmacy or Health Dept. Aware to provide a copy of the vaccination record if obtained from local pharmacy or Health Dept. Verbalized acceptance and understanding.  Flu Vaccine  status: Declined, Education has been provided regarding the importance of this vaccine but patient still declined. Advised may receive this vaccine at local pharmacy or Health Dept. Aware to provide a copy of the vaccination record if obtained from local pharmacy or Health Dept. Verbalized acceptance and understanding.  Pneumococcal vaccine status: Up to date  Covid-19 vaccine status: Declined, Education has been provided regarding the importance of this vaccine but patient still declined. Advised may receive this vaccine at local pharmacy or Health Dept.or vaccine clinic. Aware to provide a copy of the vaccination record if obtained from local pharmacy or Health Dept. Verbalized acceptance and understanding.  Qualifies for Shingles Vaccine? Yes   Zostavax completed No   Shingrix Completed?: No.    Education has been provided regarding the importance of this vaccine. Patient has been advised to call insurance company to determine out of pocket expense if they have not yet received this vaccine. Advised may also receive vaccine at local pharmacy or Health Dept. Verbalized acceptance and understanding.  Screening Tests Health Maintenance  Topic Date Due   Zoster Vaccines-  Shingrix (1 of 2) Never done   DTaP/Tdap/Td (3 - Td or Tdap) 06/22/2021   INFLUENZA VACCINE  04/28/2023   Medicare Annual Wellness (AWV)  03/15/2024   Colonoscopy  12/29/2024   Pneumonia Vaccine 38+ Years old  Completed   Hepatitis C Screening  Completed   HPV VACCINES  Aged Out   COVID-19 Vaccine  Discontinued    Health Maintenance  Health Maintenance Due  Topic Date Due   Zoster Vaccines- Shingrix (1 of 2) Never done   DTaP/Tdap/Td (3 - Td or Tdap) 06/22/2021    Colorectal cancer screening: Type of screening: Colonoscopy. Completed 12/30/2014. Repeat every 10 years  Lung Cancer Screening: (Low Dose CT Chest recommended if Age 68-80 years, 20 pack-year currently smoking OR have quit w/in 15years.) does not qualify.   Lung Cancer Screening Referral: n/a  Additional Screening:  Hepatitis C Screening: does not qualify; Completed 03/10/2018  Vision Screening: Recommended annual ophthalmology exams for early detection of glaucoma and other disorders of the eye. Is the patient up to date with their annual eye exam?  Yes  Who is the provider or what is the name of the office in which the patient attends annual eye exams? Dr.Johnson  If pt is not established with a provider, would they like to be referred to a provider to establish care? No .   Dental Screening: Recommended annual dental exams for proper oral hygiene  Diabetic Foot Exam: Diabetic Foot Exam: Overdue, Pt has been advised about the importance in completing this exam. Pt is scheduled for diabetic foot exam on next office visit .  Community Resource Referral / Chronic Care Management: CRR required this visit?  No   CCM required this visit?  No     Plan:     I have personally reviewed and noted the following in the patient's chart:   Medical and social history Use of alcohol, tobacco or illicit drugs  Current medications and supplements including opioid prescriptions. Patient is not currently taking opioid  prescriptions. Functional ability and status Nutritional status Physical activity Advanced directives List of other physicians Hospitalizations, surgeries, and ER visits in previous 12 months Vitals Screenings to include cognitive, depression, and falls Referrals and appointments  In addition, I have reviewed and discussed with patient certain preventive protocols, quality metrics, and best practice recommendations. A written personalized care plan for preventive services as well as general preventive  health recommendations were provided to patient.     Lorrene Reid, LPN   1/61/0960   After Visit Summary: (MyChart) Due to this being a telephonic visit, the after visit summary with patients personalized plan was offered to patient via MyChart   Nurse Notes: Due Tdap Vaccine

## 2023-03-16 NOTE — Patient Instructions (Signed)
[name] , Thank you for taking time to come for your Medicare Wellness Visit. I appreciate your ongoing commitment to your health goals. Please review the following plan we discussed and let me know if I can assist you in the future.   These are the goals we discussed:  Goals      Patient Stated     02/07/2020 AWV Goal: Fall Prevention  Over the next year, patient will decrease their risk for falls by: Using assistive devices, such as a cane or walker, as needed Identifying fall risks within their home and correcting them by: Removing throw rugs Adding handrails to stairs or ramps Removing clutter and keeping a clear pathway throughout the home Increasing light, especially at night Adding shower handles/bars Raising toilet seat Identifying potential personal risk factors for falls: Medication side effects Incontinence/urgency Vestibular dysfunction Hearing loss Musculoskeletal disorders Neurological disorders Orthostatic hypotension       Weight (lb) < 195 lb (88.5 kg)     Increase activity - yoga and walking 150 minutes per week.        This is a list of the screening recommended for you and due dates:  Health Maintenance  Topic Date Due   Zoster (Shingles) Vaccine (1 of 2) Never done   DTaP/Tdap/Td vaccine (3 - Td or Tdap) 06/22/2021   Flu Shot  04/28/2023   Medicare Annual Wellness Visit  03/15/2024   Colon Cancer Screening  12/29/2024   Pneumonia Vaccine  Completed   Hepatitis C Screening  Completed   HPV Vaccine  Aged Out   COVID-19 Vaccine  Discontinued    Advanced directives: Advance directive discussed with you today. I have provided a copy for you to complete at home and have notarized. Once this is complete please bring a copy in to our office so we can scan it into your chart.   Conditions/risks identified: Aim for 30 minutes of exercise or brisk walking, 6-8 glasses of water, and 5 servings of fruits and vegetables each day.   Next appointment:  Follow up in one year for your annual wellness visit.   Preventive Care 26 Years and Older, Male  Preventive care refers to lifestyle choices and visits with your health care provider that can promote health and wellness. What does preventive care include? A yearly physical exam. This is also called an annual well check. Dental exams once or twice a year. Routine eye exams. Ask your health care provider how often you should have your eyes checked. Personal lifestyle choices, including: Daily care of your teeth and gums. Regular physical activity. Eating a healthy diet. Avoiding tobacco and drug use. Limiting alcohol use. Practicing safe sex. Taking low doses of aspirin every day. Taking vitamin and mineral supplements as recommended by your health care provider. What happens during an annual well check? The services and screenings done by your health care provider during your annual well check will depend on your age, overall health, lifestyle risk factors, and family history of disease. Counseling  Your health care provider may ask you questions about your: Alcohol use. Tobacco use. Drug use. Emotional well-being. Home and relationship well-being. Sexual activity. Eating habits. History of falls. Memory and ability to understand (cognition). Work and work Astronomer. Screening  You may have the following tests or measurements: Height, weight, and BMI. Blood pressure. Lipid and cholesterol levels. These may be checked every 5 years, or more frequently if you are over 70 years old. Skin check. Lung cancer screening. You may  have this screening every year starting at age 30 if you have a 30-pack-year history of smoking and currently smoke or have quit within the past 15 years. Fecal occult blood test (FOBT) of the stool. You may have this test every year starting at age 52. Flexible sigmoidoscopy or colonoscopy. You may have a sigmoidoscopy every 5 years or a colonoscopy every  10 years starting at age 31. Prostate cancer screening. Recommendations will vary depending on your family history and other risks. Hepatitis C blood test. Hepatitis B blood test. Sexually transmitted disease (STD) testing. Diabetes screening. This is done by checking your blood sugar (glucose) after you have not eaten for a while (fasting). You may have this done every 1-3 years. Abdominal aortic aneurysm (AAA) screening. You may need this if you are a current or former smoker. Osteoporosis. You may be screened starting at age 86 if you are at high risk. Talk with your health care provider about your test results, treatment options, and if necessary, the need for more tests. Vaccines  Your health care provider may recommend certain vaccines, such as: Influenza vaccine. This is recommended every year. Tetanus, diphtheria, and acellular pertussis (Tdap, Td) vaccine. You may need a Td booster every 10 years. Zoster vaccine. You may need this after age 77. Pneumococcal 13-valent conjugate (PCV13) vaccine. One dose is recommended after age 54. Pneumococcal polysaccharide (PPSV23) vaccine. One dose is recommended after age 17. Talk to your health care provider about which screenings and vaccines you need and how often you need them. This information is not intended to replace advice given to you by your health care provider. Make sure you discuss any questions you have with your health care provider. Document Released: 10/10/2015 Document Revised: 06/02/2016 Document Reviewed: 07/15/2015 Elsevier Interactive Patient Education  2017 [employer].  Fall Prevention in the Home Falls can cause injuries. They can happen to people of all ages. There are many things you can do to make your home safe and to help prevent falls. What can I do on the outside of my home? Regularly fix the edges of walkways and driveways and fix any cracks. Remove anything that might make you trip as you walk through a door,  such as a raised step or threshold. Trim any bushes or trees on the path to your home. Use bright outdoor lighting. Clear any walking paths of anything that might make someone trip, such as rocks or tools. Regularly check to see if handrails are loose or broken. Make sure that both sides of any steps have handrails. Any raised decks and porches should have guardrails on the edges. Have any leaves, snow, or ice cleared regularly. Use sand or salt on walking paths during winter. Clean up any spills in your garage right away. This includes oil or grease spills. What can I do in the bathroom? Use night lights. Install grab bars by the toilet and in the tub and shower. Do not use towel bars as grab bars. Use non-skid mats or decals in the tub or shower. If you need to sit down in the shower, use a plastic, non-slip stool. Keep the floor dry. Clean up any water that spills on the floor as soon as it happens. Remove soap buildup in the tub or shower regularly. Attach bath mats securely with double-sided non-slip rug tape. Do not have throw rugs and other things on the floor that can make you trip. What can I do in the bedroom? Use night lights. Make sure  that you have a light by your bed that is easy to reach. Do not use any sheets or blankets that are too big for your bed. They should not hang down onto the floor. Have a firm chair that has side arms. You can use this for support while you get dressed. Do not have throw rugs and other things on the floor that can make you trip. What can I do in the kitchen? Clean up any spills right away. Avoid walking on wet floors. Keep items that you use a lot in easy-to-reach places. If you need to reach something above you, use a strong step stool that has a grab bar. Keep electrical cords out of the way. Do not use floor polish or wax that makes floors slippery. If you must use wax, use non-skid floor wax. Do not have throw rugs and other things on the  floor that can make you trip. What can I do with my stairs? Do not leave any items on the stairs. Make sure that there are handrails on both sides of the stairs and use them. Fix handrails that are broken or loose. Make sure that handrails are as long as the stairways. Check any carpeting to make sure that it is firmly attached to the stairs. Fix any carpet that is loose or worn. Avoid having throw rugs at the top or bottom of the stairs. If you do have throw rugs, attach them to the floor with carpet tape. Make sure that you have a light switch at the top of the stairs and the bottom of the stairs. If you do not have them, ask someone to add them for you. What else can I do to help prevent falls? Wear shoes that: Do not have high heels. Have rubber bottoms. Are comfortable and fit you well. Are closed at the toe. Do not wear sandals. If you use a stepladder: Make sure that it is fully opened. Do not climb a closed stepladder. Make sure that both sides of the stepladder are locked into place. Ask someone to hold it for you, if possible. Clearly mark and make sure that you can see: Any grab bars or handrails. First and last steps. Where the edge of each step is. Use tools that help you move around (mobility aids) if they are needed. These include: Canes. Walkers. Scooters. Crutches. Turn on the lights when you go into a dark area. Replace any light bulbs as soon as they burn out. Set up your furniture so you have a clear path. Avoid moving your furniture around. If any of your floors are uneven, fix them. If there are any pets around you, be aware of where they are. Review your medicines with your doctor. Some medicines can make you feel dizzy. This can increase your chance of falling. Ask your doctor what other things that you can do to help prevent falls. This information is not intended to replace advice given to you by your health care provider. Make sure you discuss any questions  you have with your health care provider. Document Released: 07/10/2009 Document Revised: 02/19/2016 Document Reviewed: 10/18/2014 Elsevier Interactive Patient Education  2017 [employer].

## 2023-03-16 NOTE — Telephone Encounter (Signed)
[facility]s, please feel free to set up Jacob Kerr with triage nurse if he is due for vaccination.  As I'm sure you are well aware, will have to address any of his wife's concerns separately in her chart as we cannot converse about her medical issues in his chart.  Please instruct him to do so.

## 2023-04-18 ENCOUNTER — Encounter (HOSPITAL_COMMUNITY): Payer: Self-pay | Admitting: Psychiatry

## 2023-04-18 ENCOUNTER — Telehealth (INDEPENDENT_AMBULATORY_CARE_PROVIDER_SITE_OTHER): Payer: Medicare HMO | Admitting: Psychiatry

## 2023-04-18 DIAGNOSIS — G479 Sleep disorder, unspecified: Secondary | ICD-10-CM | POA: Diagnosis not present

## 2023-04-18 DIAGNOSIS — G8929 Other chronic pain: Secondary | ICD-10-CM

## 2023-04-18 DIAGNOSIS — F1290 Cannabis use, unspecified, uncomplicated: Secondary | ICD-10-CM | POA: Diagnosis not present

## 2023-04-18 DIAGNOSIS — F4310 Post-traumatic stress disorder, unspecified: Secondary | ICD-10-CM | POA: Diagnosis not present

## 2023-04-18 NOTE — Patient Instructions (Addendum)
We didn't make any medications changes today. Keep up the good work with staying off of alcohol as much as possible and keep doing your best to cut back on the marijuana.  When you get a chance please coordinate with your PCP for nutrition referral for a weight loss.
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
## 2023-05-11 ENCOUNTER — Telehealth: Payer: Self-pay | Admitting: *Deleted

## 2023-05-11 NOTE — Telephone Encounter (Signed)
I connected with Jacob Kerr on 8/14 at 1029 by telephone and verified that I am speaking with the correct person using two identifiers. According to the patient's chart they are due for follow up  with [facility]. Pt scheduled. There are no transportation issues at this time. Nothing further was needed at the end of our conversation.

## 2023-07-19 ENCOUNTER — Telehealth (HOSPITAL_COMMUNITY): Payer: Medicare HMO | Admitting: Psychiatry

## 2023-07-21 ENCOUNTER — Encounter: Payer: Self-pay | Admitting: Family Medicine

## 2023-07-21 DIAGNOSIS — I10 Essential (primary) hypertension: Secondary | ICD-10-CM

## 2023-07-21 MED ORDER — AMLODIPINE BESYLATE 10 MG PO TABS: 10.0000 mg | ORAL_TABLET | Freq: Every day | ORAL | 0 refills | Status: DC

## 2023-07-25 ENCOUNTER — Other Ambulatory Visit: Payer: Medicare HMO

## 2023-07-25 DIAGNOSIS — E034 Atrophy of thyroid (acquired): Secondary | ICD-10-CM

## 2023-07-25 DIAGNOSIS — I10 Essential (primary) hypertension: Secondary | ICD-10-CM

## 2023-07-25 DIAGNOSIS — N1831 Chronic kidney disease, stage 3a: Secondary | ICD-10-CM

## 2023-07-26 ENCOUNTER — Other Ambulatory Visit: Payer: Self-pay | Admitting: Family Medicine

## 2023-07-26 DIAGNOSIS — R748 Abnormal levels of other serum enzymes: Secondary | ICD-10-CM

## 2023-07-26 DIAGNOSIS — R17 Unspecified jaundice: Secondary | ICD-10-CM

## 2023-07-26 DIAGNOSIS — R718 Other abnormality of red blood cells: Secondary | ICD-10-CM

## 2023-07-26 DIAGNOSIS — D582 Other hemoglobinopathies: Secondary | ICD-10-CM

## 2023-07-26 LAB — CMP14+EGFR
ALT: 21 [IU]/L (ref 0–44)
AST: 21 [IU]/L (ref 0–40)
Albumin: 4.7 g/dL (ref 3.8–4.8)
Alkaline Phosphatase: 134 IU/L — ABNORMAL HIGH (ref 44–121)
BUN/Creatinine Ratio: 10 (ref 10–24)
BUN: 12 mg/dL (ref 8–27)
Bilirubin Total: 1.7 mg/dL — ABNORMAL HIGH (ref 0.0–1.2)
CO2: 26 mmol/L (ref 20–29)
Calcium: 9.9 mg/dL (ref 8.6–10.2)
Chloride: 99 mmol/L (ref 96–106)
Creatinine, Ser: 1.19 mg/dL (ref 0.76–1.27)
Globulin, Total: 2.2 g/dL (ref 1.5–4.5)
Glucose: 123 mg/dL — ABNORMAL HIGH (ref 70–99)
Potassium: 4.2 mmol/L (ref 3.5–5.2)
Sodium: 140 mmol/L (ref 134–144)
Total Protein: 6.9 g/dL (ref 6.0–8.5)
eGFR: 65 mL/min/{1.73_m2} (ref >59)

## 2023-07-26 LAB — LIPID PANEL
Chol/HDL Ratio: 3.1 ratio (ref 0.0–5.0)
Cholesterol, Total: 175 mg/dL (ref 100–199)
HDL: 56 mg/dL (ref >39)
LDL Chol Calc (NIH): 87 mg/dL (ref 0–99)
Triglycerides: 189 mg/dL — ABNORMAL HIGH (ref 0–149)
VLDL Cholesterol Cal: 32 mg/dL (ref 5–40)

## 2023-07-26 LAB — CBC WITH DIFFERENTIAL/PLATELET
Basophils Absolute: 0.1 10*3/uL (ref 0.0–0.2)
Basos: 1 %
EOS (ABSOLUTE): 0.2 10*3/uL (ref 0.0–0.4)
Eos: 4 %
Hematocrit: 53.2 % — ABNORMAL HIGH (ref 37.5–51.0)
Hemoglobin: 18.5 g/dL — ABNORMAL HIGH (ref 13.0–17.7)
Immature Grans (Abs): 0 10*3/uL (ref 0.0–0.1)
Immature Granulocytes: 1 %
Lymphocytes Absolute: 1.7 10*3/uL (ref 0.7–3.1)
Lymphs: 27 %
MCH: 35.3 pg — ABNORMAL HIGH (ref 26.6–33.0)
MCHC: 34.8 g/dL (ref 31.5–35.7)
MCV: 102 fL — ABNORMAL HIGH (ref 79–97)
Monocytes Absolute: 0.7 10*3/uL (ref 0.1–0.9)
Monocytes: 11 %
Neutrophils Absolute: 3.6 10*3/uL (ref 1.4–7.0)
Neutrophils: 56 %
Platelets: 253 10*3/uL (ref 150–450)
RBC: 5.24 x10E6/uL (ref 4.14–5.80)
RDW: 12.8 % (ref 11.6–15.4)
WBC: 6.3 10*3/uL (ref 3.4–10.8)

## 2023-07-26 LAB — T4, FREE: Free T4: 1.26 ng/dL (ref 0.82–1.77)

## 2023-07-26 LAB — PSA: Prostate Specific Ag, Serum: 0.2 ng/mL (ref 0.0–4.0)

## 2023-07-26 LAB — VITAMIN D 25 HYDROXY (VIT D DEFICIENCY, FRACTURES): Vit D, 25-Hydroxy: 33.2 ng/mL (ref 30.0–100.0)

## 2023-07-26 LAB — TSH: TSH: 1.81 u[IU]/mL (ref 0.450–4.500)

## 2023-07-27 ENCOUNTER — Ambulatory Visit: Payer: Medicare HMO | Admitting: Family Medicine

## 2023-07-27 ENCOUNTER — Encounter: Payer: Self-pay | Admitting: Family Medicine

## 2023-07-27 VITALS — BP 140/86 | HR 77 | Temp 98.6°F | Ht 72.0 in

## 2023-07-27 DIAGNOSIS — R718 Other abnormality of red blood cells: Secondary | ICD-10-CM

## 2023-07-27 DIAGNOSIS — I10 Essential (primary) hypertension: Secondary | ICD-10-CM | POA: Diagnosis not present

## 2023-07-27 DIAGNOSIS — D582 Other hemoglobinopathies: Secondary | ICD-10-CM | POA: Diagnosis not present

## 2023-07-27 DIAGNOSIS — E034 Atrophy of thyroid (acquired): Secondary | ICD-10-CM | POA: Diagnosis not present

## 2023-07-27 DIAGNOSIS — R17 Unspecified jaundice: Secondary | ICD-10-CM

## 2023-07-27 DIAGNOSIS — N1831 Chronic kidney disease, stage 3a: Secondary | ICD-10-CM | POA: Diagnosis not present

## 2023-07-27 MED ORDER — LEVOTHYROXINE SODIUM 150 MCG PO TABS: ORAL_TABLET | ORAL | 3 refills | Status: DC

## 2023-07-27 MED ORDER — LOSARTAN POTASSIUM 50 MG PO TABS: 50.0000 mg | ORAL_TABLET | Freq: Every day | ORAL | 3 refills | Status: DC

## 2023-07-27 NOTE — Progress Notes (Signed)
Subjective: CC: Hypothyroidism PCP: Raliegh Ip, DO Jacob Kerr is a 72 y.o. male presenting to clinic today for:  1.  Hypothyroidism Patient is compliant with thyroid replacement.  He is Doing the half doses as directed.  He reports no changes in bowel habits, heart palpitations.  2.  Hypertension Patient is compliant with medications.  He reports no chest pain, shortness of breath or edema.  3.  Elevation in hemoglobin hematocrit Patient denies being a smoker.  He is not taking any medications other than what is prescribed to him.  He did start a supplement for glycogen support.  He is not getting any exogenous testosterone anywhere.   ROS: Per HPI  Allergies  Allergen Reactions   Cymbalta [Duloxetine Hcl] Other (See Comments)    Depression   Lyrica [Pregabalin] Other (See Comments)    Paranoid   Neurontin [Gabapentin] Other (See Comments)    nightmares   Robaxin [Methocarbamol] Other (See Comments)    Seeing "bright lights"   Sudafed [Pseudoephedrine Hcl] Other (See Comments)    Increased heart rate   Epinephrine Palpitations    Decreased blood pressure   Past Medical History:  Diagnosis Date   Acute meniscal tear of left knee    Allergic rhinitis    Anal fissure    Anxiety    BPH (benign prostatic hypertrophy)    Carpal tunnel syndrome of right wrist    Cervical spondylosis without myelopathy    Cervicogenic headache    Chronic headaches    Chronic neck pain    uses TENS unit  prn   Diverticulitis    Gallstones    GERD (gastroesophageal reflux disease)    H/O hiatal hernia    History of diverticulitis    History of kidney stones    History of TIA (transient ischemic attack)    04/ 2011---  no residuals   Hypertension    Hypothyroidism    Peripheral neuropathy    Shingles    Syrinx of spinal cord (HCC)    C4 -- C7    Current Outpatient Medications:    amLODipine (NORVASC) 10 MG tablet, Take 1 tablet (10 mg total) by mouth daily.,  Disp: 30 tablet, Rfl: 0   aspirin 81 MG tablet, Take 81 mg by mouth at bedtime., Disp: , Rfl:    Coenzyme Q10 (CO Q 10) 100 MG CAPS, Take 1 capsule by mouth daily., Disp: , Rfl:    ipratropium (ATROVENT) 0.03 % nasal spray, Place 2 sprays into both nostrils every 12 (twelve) hours., Disp: 30 mL, Rfl: 0   NON FORMULARY, Take 3 capsules by mouth every morning. Nugenix Testosterone Booster / [employer], Disp: , Rfl:    omeprazole (PRILOSEC) 20 MG capsule, TAKE 1 CAPSULE BY MOUTH TWICE  DAILY, Disp: 200 capsule, Rfl: 0   venlafaxine XR (EFFEXOR-XR) 75 MG 24 hr capsule, Take 1 capsule (75 mg total) by mouth daily with breakfast., Disp: 100 capsule, Rfl: 3   Vitamin D, Ergocalciferol, (DRISDOL) 1.25 MG (50000 UNIT) CAPS capsule, TAKE 1 CAPSULE (50,000 UNITS TOTAL) BY MOUTH TWO TIMES A WEEK, Disp: 24 capsule, Rfl: 3   zinc gluconate 50 MG tablet, Take 50 mg by mouth daily., Disp: , Rfl:    levothyroxine (SYNTHROID) 150 MCG tablet, TAKE 1 TABLET BY MOUTH DAILY  BEFORE BREAKFAST EXCEPT ONE-HALF TABLET BY MOUTH ON Saturday and SUNDAY, Disp: 135 tablet, Rfl: 3   losartan (COZAAR) 50 MG tablet, Take 1 tablet (50 mg total) by mouth daily.,  Disp: 100 tablet, Rfl: 3 Social History   Socioeconomic History   Marital status: Married    Spouse name: Jacob Kerr   Number of children: 2   Years of education: 12   Highest education level: 12th grade  Occupational History   Occupation: Retired    Associate Professor: Lexicographer  Tobacco Use   Smoking status: Never   Smokeless tobacco: Never  Vaping Use   Vaping status: Never Used  Substance and Sexual Activity   Alcohol use: Yes    Alcohol/week: 7.0 standard drinks of alcohol    Types: 7 Shots of liquor per week    Comment: Quit half gallon of liquor per week on 12/09/22. Trying to have 1 beer or less nightly as of 04/18/23   Drug use: Yes    Types: Marijuana    Comment: started use after retiring, usually two puffs per day for back pain. Cutting back as of 02/15/23   Sexual  activity: Not on file  Other Topics Concern   Not on file  Social History Narrative   Lives at home with wife.       Patient is right handed.   Patient drinks 1-2 cups of caffeine daily.   Social Determinants of Health   Financial Resource Strain: Low Risk  (03/16/2023)   Overall Financial Resource Strain (CARDIA)    Difficulty of Paying Living Expenses: Not hard at all  Food Insecurity: No Food Insecurity (03/16/2023)   Hunger Vital Sign    Worried About Running Out of Food in the Last Year: Never true    Ran Out of Food in the Last Year: Never true  Transportation Needs: No Transportation Needs (05/11/2023)   PRAPARE - Administrator, Civil Service (Medical): No    Lack of Transportation (Non-Medical): No  Physical Activity: Sufficiently Active (03/16/2023)   Exercise Vital Sign    Days of Exercise per Week: 5 days    Minutes of Exercise per Session: 30 min  Stress: No Stress Concern Present (03/16/2023)   [employer] of Occupational Health - Occupational Stress Questionnaire    Feeling of Stress : Not at all  Social Connections: Moderately Integrated (03/16/2023)   Social Connection and Isolation Panel [NHANES]    Frequency of Communication with Friends and Family: More than three times a week    Frequency of Social Gatherings with Friends and Family: More than three times a week    Attends Religious Services: 1 to 4 times per year    Active Member of [employer] or Organizations: No    Attends Banker Meetings: Never    Marital Status: Married  Catering manager Violence: Not At Risk (03/16/2023)   Humiliation, Afraid, Rape, and Kick questionnaire    Fear of Current or Ex-Partner: No    Emotionally Abused: No    Physically Abused: No    Sexually Abused: No   Family History  Problem Relation Age of Onset   Heart disease Mother    Prostate cancer Father    Heart disease Father    Stroke Father    Nephrolithiasis Sister    Cancer Sister         Intestinal   Hyperlipidemia Sister    Hyperlipidemia Sister    Cancer Sister        Sinus   Colon polyps Sister    Diabetes Brother    Colon polyps Brother        x2   COPD Other    Colon  cancer Neg Hx    Esophageal cancer Neg Hx     Objective: Office vital signs reviewed. BP (!) 140/86   Pulse 77   Temp 98.6 F (37 C)   Ht 6' (1.829 m)   SpO2 98%   BMI 26.45 kg/m   Physical Examination:  General: Awake, alert, well nourished, No acute distress HEENT: sclera white, MMM Cardio: regular rate and rhythm, S1S2 heard, no murmurs appreciated Pulm: clear to auscultation bilaterally, no wheezes, rhonchi or rales; normal work of breathing on room air Neuro: no tremor.  Assessment/ Plan: 72 y.o. male   Hypothyroidism due to acquired atrophy of thyroid - Plan: levothyroxine (SYNTHROID) 150 MCG tablet  Stage 3a chronic kidney disease (HCC) - Plan: losartan (COZAAR) 50 MG tablet  Essential hypertension - Plan: losartan (COZAAR) 50 MG tablet  Elevated hemoglobin (HCC)  Elevated hematocrit  Elevated bilirubin  Thyroid levels are stable.  Continue current regimen.  Medications have been renewed  Blood pressure borderline but technically controlled.  Continue Cozaar at current dose  He had elevation in hemoglobin, hematocrit and bilirubin.  No apparent etiology as he is a non-smoker.  I have placed referral to hematology/oncology for further evaluation.  We discussed potential differentials including hemochromatosis    Heiley Shaikh Hulen Skains, DO [facility] [phone]

## 2023-08-02 ENCOUNTER — Encounter: Payer: Self-pay | Admitting: Family Medicine

## 2023-08-12 ENCOUNTER — Inpatient Hospital Stay: Payer: Medicare HMO

## 2023-08-12 ENCOUNTER — Inpatient Hospital Stay: Payer: Medicare HMO | Attending: Oncology | Admitting: Oncology

## 2023-08-12 ENCOUNTER — Other Ambulatory Visit: Payer: Self-pay

## 2023-08-12 VITALS — BP 146/83 | HR 71 | Temp 98.0°F | Resp 17 | Ht 72.0 in | Wt 183.7 lb

## 2023-08-12 DIAGNOSIS — Z789 Other specified health status: Secondary | ICD-10-CM | POA: Insufficient documentation

## 2023-08-12 DIAGNOSIS — D751 Secondary polycythemia: Secondary | ICD-10-CM | POA: Diagnosis not present

## 2023-08-12 DIAGNOSIS — I10 Essential (primary) hypertension: Secondary | ICD-10-CM

## 2023-08-12 DIAGNOSIS — E039 Hypothyroidism, unspecified: Secondary | ICD-10-CM | POA: Insufficient documentation

## 2023-08-12 DIAGNOSIS — G8929 Other chronic pain: Secondary | ICD-10-CM | POA: Insufficient documentation

## 2023-08-12 DIAGNOSIS — K921 Melena: Secondary | ICD-10-CM | POA: Diagnosis not present

## 2023-08-12 DIAGNOSIS — N1831 Chronic kidney disease, stage 3a: Secondary | ICD-10-CM

## 2023-08-12 DIAGNOSIS — E034 Atrophy of thyroid (acquired): Secondary | ICD-10-CM

## 2023-08-12 LAB — FERRITIN: Ferritin: 38 ng/mL (ref 24–336)

## 2023-08-12 MED ORDER — LEVOTHYROXINE SODIUM 150 MCG PO TABS: ORAL_TABLET | ORAL | 3 refills | Status: DC

## 2023-08-12 MED ORDER — LOSARTAN POTASSIUM 50 MG PO TABS: 50.0000 mg | ORAL_TABLET | Freq: Every day | ORAL | 3 refills | Status: DC

## 2023-08-12 NOTE — Assessment & Plan Note (Signed)
Chronic elevation of hemoglobin since 2011, with a current level of 18.5. No acute changes. No current testosterone or steroid use. Patient has a history of alcohol use and marijuana for pain management.  JAK2 testing negative.  Previously thought to be due to dehydration.  OSA testing negative previously. -Will obtain EPO and ferritin levels -Will consider phlebotomy to decrease the risk of thrombosis. -Return to clinic in 2 weeks to discuss lab results

## 2023-08-12 NOTE — Assessment & Plan Note (Signed)
Chronic pain under the right shoulder blade and ribs, possibly related to a fall in 2015. No clear diagnosis despite multiple evaluations. Patient manages pain with marijuana and occasional alcohol use. -Encourage follow-up with pain management specialists.

## 2023-08-12 NOTE — Assessment & Plan Note (Signed)
History of significant alcohol use, currently reduced. Patient uses alcohol for pain management and relaxation. -Monitor alcohol use and encourage continued reduction.

## 2023-08-12 NOTE — Patient Instructions (Signed)
VISIT SUMMARY:  During today's visit, we discussed your persistently elevated hemoglobin levels and chronic right torso pain. We also reviewed your use of alcohol and marijuana for pain management. We have outlined a plan to address these issues and will follow up in two weeks to review lab results and discuss further steps.  YOUR PLAN:  -ELEVATED HEMOGLOBIN: Elevated hemoglobin means you have higher than normal levels of hemoglobin in your blood, which can be due to various reasons including polycythemia vera, a condition where your body makes too many red blood cells. We will conduct genetic testing for the JAK2 mutation to rule out polycythemia vera, check your hydration status, and repeat your hemoglobin level in 2 weeks. If your hemoglobin remains elevated, we may consider therapeutic phlebotomy, which is a procedure to remove some blood to reduce the hemoglobin level.  -ALCOHOL USE: You have a history of significant alcohol use, which you have currently reduced. Alcohol can be used for pain management and relaxation, but it is important to monitor and continue reducing your intake to avoid potential health risks. We will continue to monitor your alcohol use and encourage you to keep reducing it.  INSTRUCTIONS:  Please follow up in 2 weeks to discuss your lab results and potential therapeutic phlebotomy. Make sure to stay hydrated and monitor your alcohol intake. Continue with your current pain management strategies and consider consulting with pain management specialists for further evaluation.

## 2023-08-12 NOTE — Progress Notes (Signed)
[facility] at [facility] HEMATOLOGY NEW VISIT  Delynn Flavin [place], [place]  REASON FOR REFERRAL: Polycythemia  SUMMARY OF HEMATOLOGIC HISTORY:  Latest Reference Range & Units 01/15/10 14:38 02/17/10 18:24 02/18/10 04:36 02/19/10 05:20 12/11/10 19:33 01/01/13 09:40 07/04/13 09:30 03/13/14 11:56 10/22/14 14:42 11/06/14 11:38 11/29/14 15:09 05/22/15 08:49 12/17/15 12:52 01/29/16 14:39 09/01/16 08:13 03/10/18 09:14 06/09/18 12:56 06/09/18 13:11 03/28/19 15:10 11/08/19 13:34 04/03/20 08:55 12/09/20 08:33 11/24/21 08:34 07/16/22 11:52 11/19/22 09:18 07/25/23 08:55  Hemoglobin 13.0 - 17.7 g/dL 40.3 47.4 (H) 25.9 56.3 15.9 17.0 17.1 18.7 (H) 18.0 (H) 16.9 17.1 (H) 17.5 17.0 16.5 17.4 17.8 (H) 17.6 (H) 17.7 (H) 17.2 19.1 (H) 17.8 (H) 17.7 17.6 17.6 17.2 (H) 18.5 (H)  (H): Data is abnormally high   Latest Reference Range & Units 01/15/10 14:38 02/17/10 18:24 02/18/10 04:36 02/19/10 05:20 12/11/10 19:33 01/01/13 09:40 07/04/13 09:30 03/13/14 11:56 10/22/14 14:42 11/06/14 11:38 11/29/14 15:09 05/22/15 08:49 12/17/15 12:52 01/29/16 14:39 09/01/16 08:13 03/10/18 09:14 06/09/18 12:56 06/09/18 13:11 03/28/19 15:10 11/08/19 13:34 04/03/20 08:55 12/09/20 08:33 11/24/21 08:34 07/16/22 11:52 11/19/22 09:18 07/25/23 08:55  HCT 37.5 - 51.0 % 47.9 49.9 47.3 48.4 45.4 51.1 50.2 55.0 (H) 52.0 (H) 54.5 ! 48.5 51.1 (H) 49.2 48.2 50.2 50.9 50.9 52.0 50.2 53.9 (H) 51.9 52.7 (H) 51.1 (H) 50.9 48.9 53.2 (H)  (H): Data is abnormally high !: Data is abnormal  - 11/21/19 JAK2 : Negative   HISTORY OF PRESENT ILLNESS: Jacob Kerr 72 y.o. male referred for polycythemia.  He has a past medical history of hypertension and hypothyroidism.The elevated hemoglobin levels have been consistent since at least 2011, with a peak of 19.1 in 2021.  Patient was previously evaluated by Dr. Candise Che at [facility] for this and was thought to be due to dehydration and alcohol use.  Patient reports chronic pain in the right  torso, specifically under the shoulder blade and ribs.  This pain has been ongoing since a fall at work in 2015 and is described as a constant, intense pressure, likened to "someone inside trying to punch his way out." The pain is severe enough to affect the patient's mood and daily activities, and it worsens upon cessation of physical activity. The patient uses marijuana for pain management and occasionally alcohol, although he is trying to reduce his alcohol consumption and not use pain medication due to a family history of addiction.  The patient has a history of using testosterone patches approximately 25 years ago but discontinued due to headaches. He also tried an over-the-counter supplement, Neugenics, from [facility] to boost testosterone levels. The patient denies current use of any steroid medications, including prednisone, due to adverse effects.  He currently denies any testosterone use.  He was previously evaluated for sleep apnea who stated he did not have it.  Apart from the right dorsal pain patient has no other complaints today and has been doing very well.  He is still very active around the house.  He is a non-smoker.  Drinks alcohol occasionally but has come down significantly from what he was doing before and reported that he did not have a drink in the last 1 week.  Previously he would drink half a gallon per week.  He does not remember any history of blood disorders in the family.  I have reviewed the past medical history, past surgical history, social history and family history with the patient   ALLERGIES:  is allergic to cymbalta [duloxetine hcl], lyrica [pregabalin],  neurontin [gabapentin], robaxin [methocarbamol], sudafed [pseudoephedrine hcl], and epinephrine.  MEDICATIONS:  Current Outpatient Medications  Medication Sig Dispense Refill   amLODipine (NORVASC) 10 MG tablet Take 1 tablet (10 mg total) by mouth daily. 30 tablet 0   levothyroxine (SYNTHROID) 150 MCG tablet TAKE 1  TABLET BY MOUTH DAILY  BEFORE BREAKFAST EXCEPT ONE-HALF TABLET BY MOUTH ON Saturday and SUNDAY 135 tablet 3   losartan (COZAAR) 50 MG tablet Take 1 tablet (50 mg total) by mouth daily. 100 tablet 3   omeprazole (PRILOSEC) 20 MG capsule TAKE 1 CAPSULE BY MOUTH TWICE  DAILY 200 capsule 0   venlafaxine XR (EFFEXOR-XR) 75 MG 24 hr capsule Take 1 capsule (75 mg total) by mouth daily with breakfast. 100 capsule 3   zinc gluconate 50 MG tablet Take 50 mg by mouth daily.     No current facility-administered medications for this visit.     REVIEW OF SYSTEMS:   Constitutional: Denies fevers, chills or night sweats Eyes: Denies blurriness of vision Ears, nose, mouth, throat, and face: Denies mucositis or sore throat Respiratory: Denies cough, dyspnea or wheezes Cardiovascular: Denies palpitation, chest discomfort or lower extremity swelling Gastrointestinal:  Denies nausea, heartburn or change in bowel habits Skin: Denies abnormal skin rashes Lymphatics: Denies new lymphadenopathy or easy bruising Neurological:Denies numbness, tingling or new weaknesses Behavioral/Psych: Mood is stable, no new changes  All other systems were reviewed with the patient and are negative.  PHYSICAL EXAMINATION:   Vitals:   08/12/23 1011  BP: (!) 146/83  Pulse: 71  Resp: 17  Temp: 98 F (36.7 C)  SpO2: 100%    GENERAL:alert, no distress and comfortable SKIN: skin color, texture, turgor are normal, no rashes or significant lesions LYMPH:  no palpable lymphadenopathy in the cervical, axillary or inguinal LUNGS: clear to auscultation and percussion with normal breathing effort HEART: regular rate & rhythm and no murmurs and no lower extremity edema ABDOMEN:abdomen soft, non-tender and normal bowel sounds Musculoskeletal:no cyanosis of digits and no clubbing  NEURO: alert & oriented x 3 with fluent speech  LABORATORY DATA:  I have reviewed the data as listed and also as above  Lab Results  Component Value  Date   WBC 6.3 07/25/2023   NEUTROABS 3.6 07/25/2023   HGB 18.5 (H) 07/25/2023   HCT 53.2 (H) 07/25/2023   MCV 102 (H) 07/25/2023   PLT 253 07/25/2023      Component Value Date/Time   NA 140 07/25/2023 0855   K 4.2 07/25/2023 0855   CL 99 07/25/2023 0855   CO2 26 07/25/2023 0855   GLUCOSE 123 (H) 07/25/2023 0855   GLUCOSE 110 (H) 11/19/2022 0918   BUN 12 07/25/2023 0855   CREATININE 1.19 07/25/2023 0855   CREATININE 1.27 (H) 04/03/2020 0855   CREATININE 1.30 01/01/2013 0935   CALCIUM 9.9 07/25/2023 0855   CALCIUM 9.6 11/08/2019 1334   PROT 6.9 07/25/2023 0855   ALBUMIN 4.7 07/25/2023 0855   AST 21 07/25/2023 0855   AST 19 04/03/2020 0855   ALT 21 07/25/2023 0855   ALT 27 04/03/2020 0855   ALKPHOS 134 (H) 07/25/2023 0855   BILITOT 1.7 (H) 07/25/2023 0855   BILITOT 1.7 (H) 04/03/2020 0855   GFRNONAA 58 (L) 04/03/2020 0855   GFRAA >60 04/03/2020 0855      Chemistry      Component Value Date/Time   NA 140 07/25/2023 0855   K 4.2 07/25/2023 0855   CL 99 07/25/2023 0855   CO2 26 07/25/2023  0855   BUN 12 07/25/2023 0855   CREATININE 1.19 07/25/2023 0855   CREATININE 1.27 (H) 04/03/2020 0855   CREATININE 1.30 01/01/2013 0935      Component Value Date/Time   CALCIUM 9.9 07/25/2023 0855   CALCIUM 9.6 11/08/2019 1334   ALKPHOS 134 (H) 07/25/2023 0855   AST 21 07/25/2023 0855   AST 19 04/03/2020 0855   ALT 21 07/25/2023 0855   ALT 27 04/03/2020 0855   BILITOT 1.7 (H) 07/25/2023 0855   BILITOT 1.7 (H) 04/03/2020 0855       ASSESSMENT & PLAN:  Patient is a 72 year old male referred for polycythemia   Polycythemia, secondary Chronic elevation of hemoglobin since 2011, with a current level of 18.5. No acute changes. No current testosterone or steroid use. Patient has a history of alcohol use and marijuana for pain management.  JAK2 testing negative.  Previously thought to be due to dehydration.  OSA testing negative previously. -Will obtain EPO and ferritin  levels -Will consider phlebotomy to decrease the risk of thrombosis. -Return to clinic in 2 weeks to discuss lab results  Chronic pain Chronic pain under the right shoulder blade and ribs, possibly related to a fall in 2015. No clear diagnosis despite multiple evaluations. Patient manages pain with marijuana and occasional alcohol use. -Encourage follow-up with pain management specialists.  Alcohol use History of significant alcohol use, currently reduced. Patient uses alcohol for pain management and relaxation. -Monitor alcohol use and encourage continued reduction.   Orders Placed This Encounter  Procedures   Erythropoietin    Standing Status:   Future    Number of Occurrences:   1    Standing Expiration Date:   08/11/2024   Ferritin    Standing Status:   Future    Number of Occurrences:   1    Standing Expiration Date:   08/11/2024   Testosterone    Standing Status:   Future    Number of Occurrences:   1    Standing Expiration Date:   08/11/2024    The total time spent in the appointment was 45 minutes encounter with patients including review of chart and various tests results, discussions about plan of care and coordination of care plan   All questions were answered. The patient knows to call the clinic with any problems, questions or concerns. No barriers to learning was detected.   Cindie Crumbly, MD 11/15/20241:06 PM

## 2023-08-13 LAB — TESTOSTERONE: Testosterone: 510 ng/dL (ref 264–916)

## 2023-08-14 LAB — ERYTHROPOIETIN: Erythropoietin: 5.8 m[IU]/mL (ref 2.6–18.5)

## 2023-08-22 ENCOUNTER — Other Ambulatory Visit: Payer: Self-pay

## 2023-08-22 ENCOUNTER — Other Ambulatory Visit: Payer: Self-pay | Admitting: Family Medicine

## 2023-08-22 ENCOUNTER — Other Ambulatory Visit: Payer: Self-pay | Admitting: Family

## 2023-08-22 ENCOUNTER — Encounter: Payer: Self-pay | Admitting: Family Medicine

## 2023-08-22 DIAGNOSIS — I10 Essential (primary) hypertension: Secondary | ICD-10-CM

## 2023-08-22 MED ORDER — AMLODIPINE BESYLATE 10 MG PO TABS: 10.0000 mg | ORAL_TABLET | Freq: Every day | ORAL | 4 refills | Status: DC

## 2023-08-24 ENCOUNTER — Inpatient Hospital Stay (HOSPITAL_BASED_OUTPATIENT_CLINIC_OR_DEPARTMENT_OTHER): Payer: Medicare HMO | Admitting: Oncology

## 2023-08-24 ENCOUNTER — Inpatient Hospital Stay: Payer: Medicare HMO | Admitting: Oncology

## 2023-08-24 VITALS — BP 162/93 | HR 70 | Temp 97.6°F | Resp 18 | Ht 72.0 in | Wt 183.4 lb

## 2023-08-24 DIAGNOSIS — D751 Secondary polycythemia: Secondary | ICD-10-CM

## 2023-08-24 DIAGNOSIS — E611 Iron deficiency: Secondary | ICD-10-CM | POA: Insufficient documentation

## 2023-08-24 DIAGNOSIS — Z789 Other specified health status: Secondary | ICD-10-CM

## 2023-08-24 NOTE — Assessment & Plan Note (Signed)
Moderate alcohol intake reported, potential impact on hydration and hemoglobin levels discussed. -Encouraged to monitor and limit alcohol intake.

## 2023-08-24 NOTE — Assessment & Plan Note (Addendum)
Chronic elevation of hemoglobin since 2011, with a current level of 18.5. No acute changes. No current testosterone or steroid use. Patient has a history of alcohol use and marijuana for pain management.  JAK2 testing negative.  Previously thought to be due to dehydration.  OSA testing negative previously.  EPO levels are normal. -Will consider phlebotomy to decrease the risk of thrombosis.  Will do 1 unit and reassess after 3 months. -Replenish with IV iron since baseline ferritin is 43. -Return to clinic in 3 months with labs to assess for response

## 2023-08-24 NOTE — Progress Notes (Signed)
[facility] HEMATOLOGY FOLLOW-UP VISIT  Jacob Flavin M, DO  REASON FOR FOLLOW-UP: Polycythemia  ASSESSMENT & PLAN:  Patient is a 72 year old male with past medical history of alcohol use presenting for polycythemia   Polycythemia, secondary Chronic elevation of hemoglobin since 2011, with a current level of 18.5. No acute changes. No current testosterone or steroid use. Patient has a history of alcohol use and marijuana for pain management.  JAK2 testing negative.  Previously thought to be due to dehydration.  OSA testing negative previously.  People levels are normal. -Will consider phlebotomy to decrease the risk of thrombosis.  Will do 1 unit and reassess after 3 months. -Replenish with IV iron since baseline ferritin is 43. -Return to clinic in 3 months with labs to assess for response  Alcohol use Moderate alcohol intake reported, potential impact on hydration and hemoglobin levels discussed. -Encouraged to monitor and limit alcohol intake.   Orders Placed This Encounter  Procedures   Ferritin    Standing Status:   Future    Standing Expiration Date:   08/23/2024   CBC with Differential/Platelet    Standing Status:   Future    Standing Expiration Date:   08/23/2024   Comprehensive metabolic panel    Standing Status:   Future    Standing Expiration Date:   08/23/2024   Iron and TIBC    Standing Status:   Future    Standing Expiration Date:   08/23/2024    The total time spent in the appointment was 20 minutes encounter with patients including review of chart and various tests results, discussions about plan of care and coordination of care plan   All questions were answered. The patient knows to call the clinic with any problems, questions or concerns. No barriers to learning was detected.  Cindie Crumbly, MD 11/27/20244:09 PM   SUMMARY OF HEMATOLOGIC HISTORY: Chronic elevation of hemoglobin since 2011, with a current  level of 18.5.   -No current testosterone or steroid use.  -JAK2 testing negative.   -OSA testing negative previously.   -EPO levels are normal. -Alcohol use and previously related polycythemia to alcohol use and dehydration  INTERVAL HISTORY: Jacob Kerr 71 y.o. male is here for follow-up for polycythemia. He reports persistent bone pain, particularly in the ribs and shoulder blade, which has been ongoing for several years. Despite previous scans showing no evidence of bone cancer, the pain continues to be a concern for the patient. He also reports a sensation of fluid build-up in the back of his head, which he manages by self-massage.  The patient admits to occasional alcohol consumption, with an estimated four shots of whiskey mixed with juice consumed in the past week.  He denies fatigue, loss of appetite, loss of weight, fever, chills, rash.  I have reviewed the past medical history, past surgical history, social history and family history with the patient   ALLERGIES:  is allergic to cymbalta [duloxetine hcl], lyrica [pregabalin], neurontin [gabapentin], robaxin [methocarbamol], sudafed [pseudoephedrine hcl], and epinephrine.  MEDICATIONS:  Current Outpatient Medications  Medication Sig Dispense Refill   amLODipine (NORVASC) 10 MG tablet Take 1 tablet (10 mg total) by mouth daily. 100 tablet 4   levothyroxine (SYNTHROID) 150 MCG tablet TAKE 1 TABLET BY MOUTH DAILY  BEFORE BREAKFAST EXCEPT ONE-HALF TABLET BY MOUTH ON Saturday and SUNDAY 135 tablet 3   losartan (COZAAR) 50 MG tablet Take 1 tablet (50 mg total) by mouth daily. 100 tablet 3  omeprazole (PRILOSEC) 20 MG capsule TAKE 1 CAPSULE BY MOUTH TWICE  DAILY 200 capsule 0   venlafaxine XR (EFFEXOR-XR) 75 MG 24 hr capsule Take 1 capsule (75 mg total) by mouth daily with breakfast. 100 capsule 3   zinc gluconate 50 MG tablet Take 50 mg by mouth daily.     No current facility-administered medications for this visit.     REVIEW  OF SYSTEMS:   Constitutional: Denies fevers, chills or night sweats Eyes: Denies blurriness of vision Ears, nose, mouth, throat, and face: Denies mucositis or sore throat Respiratory: Denies cough, dyspnea or wheezes Cardiovascular: Denies palpitation, chest discomfort or lower extremity swelling Gastrointestinal:  Denies nausea, heartburn or change in bowel habits Skin: Denies abnormal skin rashes Lymphatics: Denies new lymphadenopathy or easy bruising Neurological:Denies numbness, tingling or new weaknesses Behavioral/Psych: Mood is stable, no new changes  All other systems were reviewed with the patient and are negative.  PHYSICAL EXAMINATION:   Vitals:   08/24/23 1356  BP: (!) 162/93  Pulse: 70  Resp: 18  Temp: 97.6 F (36.4 C)  SpO2: 99%    GENERAL:alert, no distress and comfortable SKIN: skin color, texture, turgor are normal, no rashes or significant lesions EYES: normal, Conjunctiva are pink and non-injected, sclera clear OROPHARYNX:no exudate, no erythema and lips, buccal mucosa, and tongue normal  NECK: supple, thyroid normal size, non-tender, without nodularity LYMPH:  no palpable lymphadenopathy in the cervical, axillary or inguinal LUNGS: clear to auscultation and percussion with normal breathing effort HEART: regular rate & rhythm and no murmurs and no lower extremity edema ABDOMEN:abdomen soft, non-tender and normal bowel sounds Musculoskeletal:no cyanosis of digits and no clubbing  NEURO: alert & oriented x 3 with fluent speech, no focal motor/sensory deficits  LABORATORY DATA:  I have reviewed the data as listed  Lab Results  Component Value Date   WBC 6.3 07/25/2023   NEUTROABS 3.6 07/25/2023   HGB 18.5 (H) 07/25/2023   HCT 53.2 (H) 07/25/2023   MCV 102 (H) 07/25/2023   PLT 253 07/25/2023      Component Value Date/Time   NA 140 07/25/2023 0855   K 4.2 07/25/2023 0855   CL 99 07/25/2023 0855   CO2 26 07/25/2023 0855   GLUCOSE 123 (H) 07/25/2023  0855   GLUCOSE 110 (H) 11/19/2022 0918   BUN 12 07/25/2023 0855   CREATININE 1.19 07/25/2023 0855   CREATININE 1.27 (H) 04/03/2020 0855   CREATININE 1.30 01/01/2013 0935   CALCIUM 9.9 07/25/2023 0855   CALCIUM 9.6 11/08/2019 1334   PROT 6.9 07/25/2023 0855   ALBUMIN 4.7 07/25/2023 0855   AST 21 07/25/2023 0855   AST 19 04/03/2020 0855   ALT 21 07/25/2023 0855   ALT 27 04/03/2020 0855   ALKPHOS 134 (H) 07/25/2023 0855   BILITOT 1.7 (H) 07/25/2023 0855   BILITOT 1.7 (H) 04/03/2020 0855   GFRNONAA 58 (L) 04/03/2020 0855   GFRAA >60 04/03/2020 0855       Chemistry      Component Value Date/Time   NA 140 07/25/2023 0855   K 4.2 07/25/2023 0855   CL 99 07/25/2023 0855   CO2 26 07/25/2023 0855   BUN 12 07/25/2023 0855   CREATININE 1.19 07/25/2023 0855   CREATININE 1.27 (H) 04/03/2020 0855   CREATININE 1.30 01/01/2013 0935      Component Value Date/Time   CALCIUM 9.9 07/25/2023 0855   CALCIUM 9.6 11/08/2019 1334   ALKPHOS 134 (H) 07/25/2023 0855   AST 21 07/25/2023  0855   AST 19 04/03/2020 0855   ALT 21 07/25/2023 0855   ALT 27 04/03/2020 0855   BILITOT 1.7 (H) 07/25/2023 0855   BILITOT 1.7 (H) 04/03/2020 0855      Latest Reference Range & Units 08/12/23 10:38  Erythropoietin 2.6 - 18.5 mIU/mL 5.8    Latest Reference Range & Units 01/01/13 09:27 07/04/13 09:07 05/22/15 08:49 03/10/18 09:14 08/12/23 10:38  Testosterone 264 - 916 ng/dL 161 096 045 409 811

## 2023-09-01 ENCOUNTER — Inpatient Hospital Stay: Payer: Medicare HMO

## 2023-09-01 MED FILL — Iron Sucrose Inj 20 MG/ML (Fe Equiv): INTRAVENOUS | Qty: 20 | Status: AC

## 2023-09-02 ENCOUNTER — Inpatient Hospital Stay: Payer: Medicare HMO

## 2023-09-08 ENCOUNTER — Encounter: Payer: Self-pay | Admitting: Oncology

## 2023-09-08 MED FILL — Iron Sucrose Inj 20 MG/ML (Fe Equiv): INTRAVENOUS | Qty: 20 | Status: AC

## 2023-09-09 ENCOUNTER — Inpatient Hospital Stay: Payer: Medicare HMO

## 2023-09-13 ENCOUNTER — Inpatient Hospital Stay: Payer: Medicare HMO | Attending: Oncology

## 2023-09-13 DIAGNOSIS — E611 Iron deficiency: Secondary | ICD-10-CM | POA: Insufficient documentation

## 2023-09-13 DIAGNOSIS — D751 Secondary polycythemia: Secondary | ICD-10-CM | POA: Insufficient documentation

## 2023-09-13 NOTE — Progress Notes (Signed)
Jacob Kerr presents today for theraputic phlebotomy per MD orders. Last hgb 18.5/hct 53.2 was 07/25/23 .VSS prior to procedure. Pt reports eating before arrival. Procedure started at 1532 using patients left AC. 500 mL of blood removed. Procedure ended at 1537 . Gauze and coban applied to [facility], site clean and dry. VSS upon completion of procedure. Pt denies dizziness, lightheadedness, or feeling faint. Observed for 30 minutes post procedure. Discharged in satisfactory condition with follow up instructions.

## 2023-09-16 ENCOUNTER — Inpatient Hospital Stay: Payer: Medicare HMO

## 2023-09-16 VITALS — BP 134/74 | HR 66 | Temp 97.8°F | Resp 18

## 2023-09-16 DIAGNOSIS — E611 Iron deficiency: Secondary | ICD-10-CM

## 2023-09-16 DIAGNOSIS — D751 Secondary polycythemia: Secondary | ICD-10-CM | POA: Diagnosis present

## 2023-09-16 MED ORDER — ACETAMINOPHEN 325 MG PO TABS
650.0000 mg | ORAL_TABLET | Freq: Once | ORAL | Status: AC
Start: 2023-09-16 — End: 2023-09-16
  Administered 2023-09-16: 650 mg via ORAL
  Filled 2023-09-16: qty 2

## 2023-09-16 MED ORDER — SODIUM CHLORIDE 0.9 % IV SOLN: INTRAVENOUS | Status: DC

## 2023-09-16 MED ORDER — SODIUM CHLORIDE 0.9 % IV SOLN
400.0000 mg | Freq: Once | INTRAVENOUS | Status: AC
Start: 2023-09-16 — End: 2023-09-16
  Administered 2023-09-16: 400 mg via INTRAVENOUS
  Filled 2023-09-16: qty 400

## 2023-09-16 MED ORDER — CETIRIZINE HCL 10 MG PO TABS
10.0000 mg | ORAL_TABLET | Freq: Once | ORAL | Status: AC
Start: 2023-09-16 — End: 2023-09-16
  Administered 2023-09-16: 10 mg via ORAL
  Filled 2023-09-16: qty 1

## 2023-09-16 NOTE — Progress Notes (Signed)
Patient tolerated iron infusion with no complaints voiced.  Peripheral IV site clean and dry with good blood return noted before and after infusion.  Band aid applied.  VSS with discharge and left in satisfactory condition with no s/s of distress noted.   

## 2023-09-16 NOTE — Patient Instructions (Signed)
 [facility] [place] - A DEPT OF [facility]  Discharge Instructions: Thank you for choosing [facility] to provide your oncology and hematology care.  If you have a lab appointment with the Cancer Center - please note that after April 8th, 2024, all labs will be drawn in the cancer center.  You do not have to check in or register with the main entrance as you have in the past but will complete your check-in in the cancer center.  Wear comfortable clothing and clothing appropriate for easy access to any Portacath or PICC line.   We strive to give you quality time with your provider. You may need to reschedule your appointment if you arrive late (15 or more minutes).  Arriving late affects you and other patients whose appointments are after yours.  Also, if you miss three or more appointments without notifying the office, you may be dismissed from the clinic at the provider's discretion.      For prescription refill requests, have your pharmacy contact our office and allow 72 hours for refills to be completed.    Today you received the following Venofer, return as scheduled.   To help prevent nausea and vomiting after your treatment, we encourage you to take your nausea medication as directed.  BELOW ARE SYMPTOMS THAT SHOULD BE REPORTED IMMEDIATELY: *FEVER GREATER THAN 100.4 F (38 C) OR HIGHER *CHILLS OR SWEATING *NAUSEA AND VOMITING THAT IS NOT CONTROLLED WITH YOUR NAUSEA MEDICATION *UNUSUAL SHORTNESS OF BREATH *UNUSUAL BRUISING OR BLEEDING *URINARY PROBLEMS (pain or burning when urinating, or frequent urination) *BOWEL PROBLEMS (unusual diarrhea, constipation, pain near the anus) TENDERNESS IN MOUTH AND THROAT WITH OR WITHOUT PRESENCE OF ULCERS (sore throat, sores in mouth, or a toothache) UNUSUAL RASH, SWELLING OR PAIN  UNUSUAL VAGINAL DISCHARGE OR ITCHING   Items with * indicate a potential emergency and should be followed up as soon as possible or  go to the Emergency Department if any problems should occur.  Please show the CHEMOTHERAPY ALERT CARD or IMMUNOTHERAPY ALERT CARD at check-in to the Emergency Department and triage nurse.  Should you have questions after your visit or need to cancel or reschedule your appointment, please contact [facility] [place] - A DEPT OF [facility] [phone]  and follow the prompts.  Office hours are 8:00 a.m. to 4:30 p.m. Monday - Friday. Please note that voicemails left after 4:00 p.m. may not be returned until the following business day.  We are closed weekends and major holidays. You have access to a nurse at all times for urgent questions. Please call the main number to the clinic [phone] and follow the prompts.  For any non-urgent questions, you may also contact your provider using MyChart. We now offer e-Visits for anyone 1 and older to request care online for non-urgent symptoms. For details visit [email or web address].   Also download the MyChart app! Go to the app store, search "MyChart", open the app, select [place], and log in with your MyChart username and password.

## 2023-09-27 ENCOUNTER — Inpatient Hospital Stay: Payer: Medicare HMO

## 2023-09-27 VITALS — BP 143/80 | HR 74 | Temp 97.7°F | Resp 18 | Ht 72.0 in | Wt 178.7 lb

## 2023-09-27 DIAGNOSIS — E611 Iron deficiency: Secondary | ICD-10-CM

## 2023-09-27 MED ORDER — CETIRIZINE HCL 10 MG PO TABS
10.0000 mg | ORAL_TABLET | Freq: Once | ORAL | Status: AC
Start: 2023-09-27 — End: 2023-09-27
  Administered 2023-09-27: 10 mg via ORAL
  Filled 2023-09-27: qty 1

## 2023-09-27 MED ORDER — ACETAMINOPHEN 325 MG PO TABS
650.0000 mg | ORAL_TABLET | Freq: Once | ORAL | Status: AC
  Administered 2023-09-27: 650 mg via ORAL
  Filled 2023-09-27: qty 2

## 2023-09-27 MED ORDER — SODIUM CHLORIDE 0.9 % IV SOLN: INTRAVENOUS | Status: DC

## 2023-09-27 MED ORDER — SODIUM CHLORIDE 0.9 % IV SOLN
400.0000 mg | Freq: Once | INTRAVENOUS | Status: AC
  Administered 2023-09-27: 400 mg via INTRAVENOUS
  Filled 2023-09-27: qty 20

## 2023-09-27 NOTE — Progress Notes (Signed)
 Patient tolerated iron infusion with no complaints voiced.  Peripheral IV site clean and dry with good blood return noted before and after infusion.  Band aid applied. Pt observed for 30 minutes post iron infusion without any complications.  VSS with discharge and left in satisfactory condition with no s/s of distress noted. All follow ups as scheduled.   Jacob Kerr [employer]

## 2023-09-27 NOTE — Patient Instructions (Signed)
 Iron Sucrose Injection What is this medication? IRON SUCROSE (EYE ern SOO krose) treats low levels of iron (iron deficiency anemia) in people with kidney disease. Iron is a mineral that plays an important role in making red blood cells, which carry oxygen from your lungs to the rest of your body. This medicine may be used for other purposes; ask your health care provider or pharmacist if you have questions. COMMON BRAND NAME(S): Venofer What should I tell my care team before I take this medication? They need to know if you have any of these conditions: Anemia not caused by low iron levels Heart disease High levels of iron in the blood Kidney disease Liver disease An unusual or allergic reaction to iron, other medications, foods, dyes, or preservatives Pregnant or trying to get pregnant Breastfeeding How should I use this medication? This medication is for infusion into a vein. It is given in a hospital or clinic setting. Talk to your care team about the use of this medication in children. While this medication may be prescribed for children as young as 2 years for selected conditions, precautions do apply. Overdosage: If you think you have taken too much of this medicine contact a poison control center or emergency room at once. NOTE: This medicine is only for you. Do not share this medicine with others. What if I miss a dose? Keep appointments for follow-up doses. It is important not to miss your dose. Call your care team if you are unable to keep an appointment. What may interact with this medication? Do not take this medication with any of the following: Deferoxamine Dimercaprol Other iron products This medication may also interact with the following: Chloramphenicol Deferasirox This list may not describe all possible interactions. Give your health care provider a list of all the medicines, herbs, non-prescription drugs, or dietary supplements you use. Also tell them if you smoke,  drink alcohol, or use illegal drugs. Some items may interact with your medicine. What should I watch for while using this medication? Visit your care team regularly. Tell your care team if your symptoms do not start to get better or if they get worse. You may need blood work done while you are taking this medication. You may need to follow a special diet. Talk to your care team. Foods that contain iron include: whole grains/cereals, dried fruits, beans, or peas, leafy green vegetables, and organ meats (liver, kidney). What side effects may I notice from receiving this medication? Side effects that you should report to your care team as soon as possible: Allergic reactions--skin rash, itching, hives, swelling of the face, lips, tongue, or throat Low blood pressure--dizziness, feeling faint or lightheaded, blurry vision Shortness of breath Side effects that usually do not require medical attention (report to your care team if they continue or are bothersome): Flushing Headache Joint pain Muscle pain Nausea Pain, redness, or irritation at injection site This list may not describe all possible side effects. Call your doctor for medical advice about side effects. You may report side effects to FDA at 1-800-FDA-1088. Where should I keep my medication? This medication is given in a hospital or clinic. It will not be stored at home. NOTE: This sheet is a summary. It may not cover all possible information. If you have questions about this medicine, talk to your doctor, pharmacist, or health care provider.  2024 Elsevier/Gold Standard (2023-02-18 00:00:00)

## 2023-11-17 ENCOUNTER — Inpatient Hospital Stay: Payer: Medicare HMO

## 2023-11-17 ENCOUNTER — Inpatient Hospital Stay: Payer: Medicare HMO | Attending: Oncology

## 2023-11-17 DIAGNOSIS — D751 Secondary polycythemia: Secondary | ICD-10-CM | POA: Insufficient documentation

## 2023-11-17 DIAGNOSIS — Z79899 Other long term (current) drug therapy: Secondary | ICD-10-CM | POA: Diagnosis not present

## 2023-11-17 DIAGNOSIS — E611 Iron deficiency: Secondary | ICD-10-CM | POA: Diagnosis present

## 2023-11-17 LAB — IRON AND TIBC
Iron: 113 ug/dL (ref 45–182)
Saturation Ratios: 26 % (ref 17.9–39.5)
TIBC: 430 ug/dL (ref 250–450)
UIBC: 317 ug/dL

## 2023-11-17 LAB — COMPREHENSIVE METABOLIC PANEL
ALT: 20 U/L (ref 0–44)
AST: 19 U/L (ref 15–41)
Albumin: 4.2 g/dL (ref 3.5–5.0)
Alkaline Phosphatase: 88 U/L (ref 38–126)
Anion gap: 9 (ref 5–15)
BUN: 13 mg/dL (ref 8–23)
CO2: 27 mmol/L (ref 22–32)
Calcium: 9.2 mg/dL (ref 8.9–10.3)
Chloride: 107 mmol/L (ref 98–111)
Creatinine, Ser: 1.18 mg/dL (ref 0.61–1.24)
GFR, Estimated: >60 mL/min (ref >60)
Glucose, Bld: 82 mg/dL (ref 70–99)
Potassium: 3.7 mmol/L (ref 3.5–5.1)
Sodium: 143 mmol/L (ref 135–145)
Total Bilirubin: 1.9 mg/dL — ABNORMAL HIGH (ref 0.0–1.2)
Total Protein: 6.7 g/dL (ref 6.5–8.1)

## 2023-11-17 LAB — CBC WITH DIFFERENTIAL/PLATELET
Abs Immature Granulocytes: 0.01 10*3/uL (ref 0.00–0.07)
Basophils Absolute: 0.1 10*3/uL (ref 0.0–0.1)
Basophils Relative: 1 %
Eosinophils Absolute: 0.1 10*3/uL (ref 0.0–0.5)
Eosinophils Relative: 2 %
HCT: 49.7 % (ref 39.0–52.0)
Hemoglobin: 17.6 g/dL — ABNORMAL HIGH (ref 13.0–17.0)
Immature Granulocytes: 0 %
Lymphocytes Relative: 26 %
Lymphs Abs: 1.4 10*3/uL (ref 0.7–4.0)
MCH: 34.5 pg — ABNORMAL HIGH (ref 26.0–34.0)
MCHC: 35.4 g/dL (ref 30.0–36.0)
MCV: 97.5 fL (ref 80.0–100.0)
Monocytes Absolute: 0.6 10*3/uL (ref 0.1–1.0)
Monocytes Relative: 11 %
Neutro Abs: 3.1 10*3/uL (ref 1.7–7.7)
Neutrophils Relative %: 60 %
Platelets: 240 10*3/uL (ref 150–400)
RBC: 5.1 MIL/uL (ref 4.22–5.81)
RDW: 12.3 % (ref 11.5–15.5)
WBC: 5.2 10*3/uL (ref 4.0–10.5)
nRBC: 0 % (ref 0.0–0.2)

## 2023-11-17 LAB — FERRITIN: Ferritin: 91 ng/mL (ref 24–336)

## 2023-11-18 ENCOUNTER — Encounter: Payer: Self-pay | Admitting: Family Medicine

## 2023-11-18 DIAGNOSIS — F4310 Post-traumatic stress disorder, unspecified: Secondary | ICD-10-CM

## 2023-11-18 NOTE — Telephone Encounter (Signed)
 Will cc prescribing MD as Lorain Childes. Might consider taper

## 2023-11-21 MED ORDER — VENLAFAXINE HCL ER 37.5 MG PO CP24: 37.5000 mg | ORAL_CAPSULE | Freq: Every day | ORAL | 0 refills | Status: DC

## 2023-11-21 NOTE — Telephone Encounter (Signed)
 He responded to our pools rather than you. So copying you on his message: I haven't stopped taking it yet.  Thought I'd see if I can live with one less Rx. I think I have one for today.  My vision jumped around months ago when I did not rake one for 3 days.

## 2023-11-24 ENCOUNTER — Inpatient Hospital Stay (HOSPITAL_BASED_OUTPATIENT_CLINIC_OR_DEPARTMENT_OTHER): Payer: Medicare HMO | Admitting: Oncology

## 2023-11-24 VITALS — BP 135/79 | HR 70 | Temp 96.7°F | Resp 18 | Wt 180.1 lb

## 2023-11-24 DIAGNOSIS — D751 Secondary polycythemia: Secondary | ICD-10-CM

## 2023-11-24 DIAGNOSIS — E611 Iron deficiency: Secondary | ICD-10-CM

## 2023-11-24 DIAGNOSIS — G8929 Other chronic pain: Secondary | ICD-10-CM | POA: Diagnosis not present

## 2023-11-24 DIAGNOSIS — Z789 Other specified health status: Secondary | ICD-10-CM

## 2023-11-24 NOTE — Patient Instructions (Signed)
 VISIT SUMMARY:  During today's visit, we discussed your ongoing management of hemochromatosis and polycythemia. You reported no significant improvement after the IV iron infusions and shared that you experience many highs and lows. We also reviewed your weight stability and reduced alcohol consumption. Additionally, you expressed concerns about your wife's recent health issues.  YOUR PLAN:  -POLYCYTHEMIA: Polycythemia is a condition where there are too many red blood cells in your blood, which can make it thicker and harder to flow. Your hemoglobin level is 17.5, and your ferritin is not elevated. Since you have no current symptoms of hyperviscosity, we will hold off on phlebotomy for now and plan to monitor your condition with labs in 3 months.  -ALCOHOL USE: You have reported a decrease in alcohol consumption and show no signs of alcohol withdrawal or dependence. We will continue to monitor your alcohol intake to ensure it remains at a safe level.  -GENERAL HEALTH MAINTENANCE: We will review your routine labs and overall health during your next visit. It is important to maintain a healthy lifestyle, including a balanced diet, regular exercise, and avoiding excessive alcohol consumption.  INSTRUCTIONS:  Please return for follow-up and labs in 3 months.

## 2023-11-24 NOTE — Assessment & Plan Note (Signed)
 Patient has low ferritin levels.  Administered IV iron after phlebotomy. -Continue to monitor and replace as needed

## 2023-11-24 NOTE — Assessment & Plan Note (Signed)
 Moderate alcohol intake reported, potential impact on hydration and hemoglobin levels discussed. -Encouraged to monitor and limit alcohol intake.

## 2023-11-24 NOTE — Assessment & Plan Note (Signed)
 Chronic elevation of hemoglobin since 2011, with a current level of 17.6. No acute changes. No current testosterone or steroid use. Patient has a history of alcohol use and marijuana for pain management.  JAK2 testing negative.  Previously thought to be due to dehydration.  OSA testing negative previously.  EPO levels are normal.  S/p 1 phlebotomy. -Continue to monitor CBC at this time -Cautiously do phlebotomy as patient has low ferritin levels and can induce iron deficiency  Return to clinic in 3 months with labs

## 2023-11-24 NOTE — Progress Notes (Signed)
 [facility] at [facility] HEMATOLOGY FOLLOW-UP VISIT  Jacob Flavin M, DO  REASON FOR FOLLOW-UP: Polycythemia  ASSESSMENT & PLAN:  Patient is a 73 year old male with past medical history of alcohol use presenting for polycythemia   Polycythemia, secondary Chronic elevation of hemoglobin since 2011, with a current level of 17.6. No acute changes. No current testosterone or steroid use. Patient has a history of alcohol use and marijuana for pain management.  JAK2 testing negative.  Previously thought to be due to dehydration.  OSA testing negative previously.  EPO levels are normal.  S/p 1 phlebotomy. -Continue to monitor CBC at this time -Cautiously do phlebotomy as patient has low ferritin levels and can induce iron deficiency  Return to clinic in 3 months with labs  Chronic pain Chronic pain under the right shoulder blade and ribs, possibly related to a fall in 2015. No clear diagnosis despite multiple evaluations. Patient manages pain with marijuana and occasional alcohol use. -Encourage follow-up with pain management specialists.  Alcohol use Moderate alcohol intake reported, potential impact on hydration and hemoglobin levels discussed. -Encouraged to monitor and limit alcohol intake.  Iron deficiency Patient has low ferritin levels.  Administered IV iron after phlebotomy. -Continue to monitor and replace as needed    Orders Placed This Encounter  Procedures   Ferritin    Standing Status:   Future    Expected Date:   02/20/2024    Expiration Date:   11/23/2024   CBC with Differential/Platelet    Standing Status:   Future    Expected Date:   02/20/2024    Expiration Date:   11/23/2024   Iron and TIBC    Standing Status:   Future    Expected Date:   02/20/2024    Expiration Date:   11/23/2024    The total time spent in the appointment was 20 minutes encounter with patients including review of chart and various tests results, discussions about  plan of care and coordination of care plan   All questions were answered. The patient knows to call the clinic with any problems, questions or concerns. No barriers to learning was detected.  Cindie Crumbly, MD 2/27/20253:03 PM   SUMMARY OF HEMATOLOGIC HISTORY: Chronic elevation of hemoglobin since 2011, with a current level of 18.5.   -No current testosterone or steroid use.  -JAK2 testing negative.   -OSA testing negative previously.   -EPO levels are normal. -Alcohol use and previously related polycythemia to alcohol use and dehydration -s/p 1 phlebotomy on 09/13/23 -s/p IV venofer 400mg  x 2 doses 09/16/23 and 09/27/23  INTERVAL HISTORY: Jacob Kerr 73 y.o. male is here for follow-up for polycythemia.  He continues to report chronic back pain but is overall doing well.  He has no other complaints today.  He continues to drink alcohol but has cut down significantly.   He denies fatigue, loss of appetite, loss of weight, fever, chills, rash.  I have reviewed the past medical history, past surgical history, social history and family history with the patient   ALLERGIES:  is allergic to cymbalta [duloxetine hcl], lyrica [pregabalin], neurontin [gabapentin], robaxin [methocarbamol], sudafed [pseudoephedrine hcl], and epinephrine.  MEDICATIONS:  Current Outpatient Medications  Medication Sig Dispense Refill   amLODipine (NORVASC) 10 MG tablet Take 1 tablet (10 mg total) by mouth daily. 100 tablet 4   levothyroxine (SYNTHROID) 150 MCG tablet TAKE 1 TABLET BY MOUTH DAILY  BEFORE BREAKFAST EXCEPT ONE-HALF TABLET BY MOUTH ON Saturday and SUNDAY  135 tablet 3   losartan (COZAAR) 50 MG tablet Take 1 tablet (50 mg total) by mouth daily. 100 tablet 3   omeprazole (PRILOSEC) 20 MG capsule TAKE 1 CAPSULE BY MOUTH TWICE  DAILY 200 capsule 0   zinc gluconate 50 MG tablet Take 50 mg by mouth daily.     venlafaxine XR (EFFEXOR-XR) 37.5 MG 24 hr capsule Take 1 capsule (37.5 mg total) by mouth  daily with breakfast. Then discontinue. (Patient not taking: Reported on 11/24/2023) 30 capsule 0   No current facility-administered medications for this visit.     REVIEW OF SYSTEMS:   Constitutional: Denies fevers, chills or night sweats Eyes: Denies blurriness of vision Ears, nose, mouth, throat, and face: Denies mucositis or sore throat Respiratory: Denies cough, dyspnea or wheezes Cardiovascular: Denies palpitation, chest discomfort or lower extremity swelling Gastrointestinal:  Denies nausea, heartburn or change in bowel habits Skin: Denies abnormal skin rashes Lymphatics: Denies new lymphadenopathy or easy bruising Neurological:Denies numbness, tingling or new weaknesses Behavioral/Psych: Mood is stable, no new changes  All other systems were reviewed with the patient and are negative.  PHYSICAL EXAMINATION:   Vitals:   11/24/23 1352  BP: 135/79  Pulse: 70  Resp: 18  Temp: (!) 96.7 F (35.9 C)  SpO2: 99%    GENERAL:alert, no distress and comfortable LUNGS: clear to auscultation and percussion with normal breathing effort HEART: regular rate & rhythm and no murmurs and no lower extremity edema ABDOMEN:abdomen soft, non-tender and normal bowel sounds Musculoskeletal:no cyanosis of digits and no clubbing  NEURO: alert & oriented x 3 with fluent speech   LABORATORY DATA:  I have reviewed the data as listed  Lab Results  Component Value Date   WBC 5.2 11/17/2023   NEUTROABS 3.1 11/17/2023   HGB 17.6 (H) 11/17/2023   HCT 49.7 11/17/2023   MCV 97.5 11/17/2023   PLT 240 11/17/2023       Chemistry      Component Value Date/Time   NA 143 11/17/2023 1152   NA 140 07/25/2023 0855   K 3.7 11/17/2023 1152   CL 107 11/17/2023 1152   CO2 27 11/17/2023 1152   BUN 13 11/17/2023 1152   BUN 12 07/25/2023 0855   CREATININE 1.18 11/17/2023 1152   CREATININE 1.27 (H) 04/03/2020 0855   CREATININE 1.30 01/01/2013 0935      Component Value Date/Time   CALCIUM 9.2  11/17/2023 1152   CALCIUM 9.6 11/08/2019 1334   ALKPHOS 88 11/17/2023 1152   AST 19 11/17/2023 1152   AST 19 04/03/2020 0855   ALT 20 11/17/2023 1152   ALT 27 04/03/2020 0855   BILITOT 1.9 (H) 11/17/2023 1152   BILITOT 1.7 (H) 07/25/2023 0855   BILITOT 1.7 (H) 04/03/2020 0855      Latest Reference Range & Units 08/12/23 10:38  Erythropoietin 2.6 - 18.5 mIU/mL 5.8    Latest Reference Range & Units 01/01/13 09:27 07/04/13 09:07 05/22/15 08:49 03/10/18 09:14 08/12/23 10:38  Testosterone 264 - 916 ng/dL 161 096 045 409 811    Latest Reference Range & Units 11/17/23 11:52  Iron 45 - 182 ug/dL 914  UIBC ug/dL 782  TIBC 956 - 213 ug/dL 086  Saturation Ratios 17.9 - 39.5 % 26  Ferritin 24 - 336 ng/mL 91

## 2023-11-24 NOTE — Assessment & Plan Note (Signed)
 Chronic pain under the right shoulder blade and ribs, possibly related to a fall in 2015. No clear diagnosis despite multiple evaluations. Patient manages pain with marijuana and occasional alcohol use. -Encourage follow-up with pain management specialists.

## 2023-12-30 ENCOUNTER — Encounter: Payer: Self-pay | Admitting: Oncology

## 2023-12-30 ENCOUNTER — Emergency Department (HOSPITAL_BASED_OUTPATIENT_CLINIC_OR_DEPARTMENT_OTHER)

## 2023-12-30 ENCOUNTER — Other Ambulatory Visit: Payer: Self-pay

## 2023-12-30 ENCOUNTER — Emergency Department (HOSPITAL_BASED_OUTPATIENT_CLINIC_OR_DEPARTMENT_OTHER): Admission: EM | Admit: 2023-12-30 | Discharge: 2023-12-30 | Disposition: A | Discharge status: Home / Self Care

## 2023-12-30 ENCOUNTER — Encounter (HOSPITAL_BASED_OUTPATIENT_CLINIC_OR_DEPARTMENT_OTHER): Payer: Self-pay

## 2023-12-30 DIAGNOSIS — R531 Weakness: Secondary | ICD-10-CM | POA: Diagnosis not present

## 2023-12-30 DIAGNOSIS — Z79899 Other long term (current) drug therapy: Secondary | ICD-10-CM | POA: Insufficient documentation

## 2023-12-30 DIAGNOSIS — G8929 Other chronic pain: Secondary | ICD-10-CM | POA: Diagnosis not present

## 2023-12-30 DIAGNOSIS — M545 Low back pain, unspecified: Secondary | ICD-10-CM | POA: Insufficient documentation

## 2023-12-30 DIAGNOSIS — R202 Paresthesia of skin: Secondary | ICD-10-CM | POA: Insufficient documentation

## 2023-12-30 DIAGNOSIS — M549 Dorsalgia, unspecified: Secondary | ICD-10-CM

## 2023-12-30 LAB — BASIC METABOLIC PANEL WITH GFR
Anion gap: 7 (ref 5–15)
BUN: 11 mg/dL (ref 8–23)
CO2: 29 mmol/L (ref 22–32)
Calcium: 9.1 mg/dL (ref 8.9–10.3)
Chloride: 103 mmol/L (ref 98–111)
Creatinine, Ser: 1.06 mg/dL (ref 0.61–1.24)
GFR, Estimated: >60 mL/min (ref >60)
Glucose, Bld: 116 mg/dL — ABNORMAL HIGH (ref 70–99)
Potassium: 3.7 mmol/L (ref 3.5–5.1)
Sodium: 139 mmol/L (ref 135–145)

## 2023-12-30 LAB — URINALYSIS, ROUTINE W REFLEX MICROSCOPIC
Bilirubin Urine: NEGATIVE
Glucose, UA: NEGATIVE mg/dL
Hgb urine dipstick: NEGATIVE
Ketones, ur: NEGATIVE mg/dL
Leukocytes,Ua: NEGATIVE
Nitrite: NEGATIVE
Protein, ur: NEGATIVE mg/dL
Specific Gravity, Urine: 1.015 (ref 1.005–1.030)
pH: 6.5 (ref 5.0–8.0)

## 2023-12-30 LAB — CBC
HCT: 49.2 % (ref 39.0–52.0)
Hemoglobin: 17.3 g/dL — ABNORMAL HIGH (ref 13.0–17.0)
MCH: 33.6 pg (ref 26.0–34.0)
MCHC: 35.2 g/dL (ref 30.0–36.0)
MCV: 95.5 fL (ref 80.0–100.0)
Platelets: 206 10*3/uL (ref 150–400)
RBC: 5.15 MIL/uL (ref 4.22–5.81)
RDW: 12.4 % (ref 11.5–15.5)
WBC: 5.7 10*3/uL (ref 4.0–10.5)
nRBC: 0 % (ref 0.0–0.2)

## 2023-12-30 MED ORDER — CYCLOBENZAPRINE HCL 5 MG PO TABS
5.0000 mg | ORAL_TABLET | Freq: Once | ORAL | Status: AC
  Administered 2023-12-30: 5 mg via ORAL
  Filled 2023-12-30: qty 1

## 2023-12-30 MED ORDER — ACETAMINOPHEN 325 MG PO TABS
650.0000 mg | ORAL_TABLET | Freq: Once | ORAL | Status: AC
  Administered 2023-12-30: 650 mg via ORAL
  Filled 2023-12-30: qty 2

## 2023-12-30 MED ORDER — CYCLOBENZAPRINE HCL 10 MG PO TABS: 10.0000 mg | ORAL_TABLET | Freq: Two times a day (BID) | ORAL | 0 refills | Status: DC | PRN

## 2023-12-30 MED ORDER — METHYLPREDNISOLONE 4 MG PO TBPK: ORAL_TABLET | ORAL | 0 refills | Status: DC

## 2023-12-30 MED ORDER — LIDOCAINE 5 % EX PTCH
1.0000 | MEDICATED_PATCH | Freq: Once | CUTANEOUS | Status: DC
  Administered 2023-12-30: 1 via TRANSDERMAL
  Filled 2023-12-30: qty 1

## 2023-12-30 NOTE — ED Triage Notes (Signed)
 Pt presents to ED from home C/O back pain X 11 years and intermittent incontinence, last episode 10 days ago.

## 2023-12-30 NOTE — ED Notes (Signed)
Pt aware of the need for a urine... Pt unable to currently provide a sample.Marland KitchenMarland Kitchen

## 2023-12-30 NOTE — Discharge Instructions (Addendum)
 Your MRI that was done today showed no significant changes from your old MRIs.  I have sent your prescription for a medicine called Flexeril which you can take up to 2 times per day.  Do not drive if you are taking Flexeril.  I have also sent you a steroid Dosepak.  Please follow-up with your primary care doctor within 1 to 2 weeks to discuss the symptoms.

## 2023-12-30 NOTE — ED Provider Notes (Signed)
  Physical Exam  BP (!) 152/95   Pulse 62   Temp 97.9 F (36.6 C)   Resp 18   Ht 6' (1.829 m)   Wt 81.6 kg   SpO2 97%   BMI 24.41 kg/m   Physical Exam  Procedures  Procedures  ED Course / MDM   Clinical Course as of 12/30/23 1823  Fri Dec 30, 2023  1207 MRI thoracic and lumbar spine in 2019: "IMPRESSION: MR THORACIC SPINE IMPRESSION   1. Slightly increased craniocaudal length prominent central canal versus thoracic spinal cord syrinx. The finding now extends inferiorly to the T9 level, previously the T6 level, but the transverse dimensions are unchanged. 2. Thoracic dextroscoliosis. 3. No thoracic spinal canal or foraminal stenosis.   MR LUMBAR SPINE IMPRESSION   Normal lumbar spine. " [TY]    Clinical Course User Index [TY] Coral Spikes, DO   Medical Decision Making Amount and/or Complexity of Data Reviewed Labs: ordered. Radiology: ordered.  Risk OTC drugs. Prescription drug management.   Jacob Kerr, assumed care for this patient.  In brief 73 year old male here today with back pain, paresthesias.  Patient was signed out pending MRI of lumbar and thoracic spine.  MRI without acute findings, no cord compression.  Patient feeling bit better, would like to go home.  Believe this is reasonable.  Will discharge patient with Flexeril.  He will follow-up with his PCP.       Jacob Simmonds T, DO 12/30/23 Jacob Kerr

## 2023-12-30 NOTE — ED Notes (Signed)
 Discharge paperwork given and verbally understood.

## 2023-12-30 NOTE — ED Provider Notes (Signed)
 [facility] EMERGENCY DEPARTMENT AT [facility] Provider Note   CSN: [id number] Arrival date & time: 12/30/23  1046     History  Chief Complaint  Patient presents with   Back Pain    Jacob Kerr is a 73 y.o. male.  This is a 73 year old male presenting emergency department for acute on chronic low back pain.  Reports that he has had symptoms for the past 11 years.  Notes that he has syrinx in neck and thoracic spine spinal cord.  He has had worsening pain over the past 3 weeks.  Does not take pain medications.  Pain seemingly worsened.  Described as sharp.  Worsens with movement.  Better with rest.  He notes some weakness in his right lower extremity and notes that he has had some intermittent bowel incontinence and some urinary retention over the same duration of symptoms.  He also notes some paresthesia to perineum, but he does have feeling.  Reports able to ambulate.   Back Pain      Home Medications Prior to Admission medications   Medication Sig Start Date End Date Taking? Authorizing Provider  amLODipine (NORVASC) 10 MG tablet Take 1 tablet (10 mg total) by mouth daily. 08/22/23   Raliegh Ip, DO  levothyroxine (SYNTHROID) 150 MCG tablet TAKE 1 TABLET BY MOUTH DAILY  BEFORE BREAKFAST EXCEPT ONE-HALF TABLET BY MOUTH ON Saturday and SUNDAY 08/12/23   Delynn Flavin M, DO  losartan (COZAAR) 50 MG tablet Take 1 tablet (50 mg total) by mouth daily. 08/12/23   Raliegh Ip, DO  omeprazole (PRILOSEC) 20 MG capsule TAKE 1 CAPSULE BY MOUTH TWICE  DAILY 11/11/22   Delynn Flavin M, DO  venlafaxine XR (EFFEXOR-XR) 37.5 MG 24 hr capsule Take 1 capsule (37.5 mg total) by mouth daily with breakfast. Then discontinue. Patient not taking: Reported on 11/24/2023 11/21/23   Elsie Lincoln, MD  zinc gluconate 50 MG tablet Take 50 mg by mouth daily.    [provider]      Allergies    Cymbalta [duloxetine hcl], Lyrica [pregabalin], Neurontin  [gabapentin], Robaxin [methocarbamol], Sudafed [pseudoephedrine hcl], and Epinephrine    Review of Systems   Review of Systems  Musculoskeletal:  Positive for back pain.    Physical Exam Updated Vital Signs BP (!) 161/86 (BP Location: Right Arm)   Pulse 67   Temp 97.9 F (36.6 C)   Resp 18   Ht 6' (1.829 m)   Wt 81.6 kg   SpO2 99%   BMI 24.41 kg/m  Physical Exam Vitals and nursing note reviewed.  Constitutional:      General: He is not in acute distress.    Appearance: He is not toxic-appearing.  HENT:     Head: Normocephalic.     Nose: Nose normal.     Mouth/Throat:     Mouth: Mucous membranes are moist.  Eyes:     Conjunctiva/sclera: Conjunctivae normal.  Cardiovascular:     Rate and Rhythm: Normal rate and regular rhythm.  Pulmonary:     Effort: Pulmonary effort is normal.     Breath sounds: Normal breath sounds.  Abdominal:     General: Abdomen is flat. There is no distension.     Tenderness: There is no abdominal tenderness. There is no guarding or rebound.  Musculoskeletal:     Comments: 5 out of 5 plantarflexion, dorsiflexion, hip extension.  Normal sensation in lower extremities.  Diffuse tenderness across the low back.  Skin:  General: Skin is warm and dry.     Capillary Refill: Capillary refill takes less than 2 seconds.  Neurological:     Mental Status: He is alert and oriented to person, place, and time.  Psychiatric:        Mood and Affect: Mood normal.        Behavior: Behavior normal.     ED Results / Procedures / Treatments   Labs (all labs ordered are listed, but only abnormal results are displayed) Labs Reviewed  CBC - Abnormal; Notable for the following components:      Result Value   Hemoglobin 17.3 (*)    All other components within normal limits  BASIC METABOLIC PANEL WITH GFR - Abnormal; Notable for the following components:   Glucose, Bld 116 (*)    All other components within normal limits  URINALYSIS, ROUTINE W REFLEX  MICROSCOPIC    EKG None  Radiology No results found.  Procedures Procedures    Medications Ordered in ED Medications  lidocaine (LIDODERM) 5 % 1 patch (1 patch Transdermal Patch Applied 12/30/23 1223)  acetaminophen (TYLENOL) tablet 650 mg (650 mg Oral Given 12/30/23 1222)  cyclobenzaprine (FLEXERIL) tablet 5 mg (5 mg Oral Given 12/30/23 1223)    ED Course/ Medical Decision Making/ A&P Clinical Course as of 12/30/23 1541  Fri Dec 30, 2023  1207 MRI thoracic and lumbar spine in 2019: "IMPRESSION: MR THORACIC SPINE IMPRESSION   1. Slightly increased craniocaudal length prominent central canal versus thoracic spinal cord syrinx. The finding now extends inferiorly to the T9 level, previously the T6 level, but the transverse dimensions are unchanged. 2. Thoracic dextroscoliosis. 3. No thoracic spinal canal or foraminal stenosis.   MR LUMBAR SPINE IMPRESSION   Normal lumbar spine. " [TY]    Clinical Course User Index [TY] Coral Spikes, DO                                 Medical Decision Making This is a 73 year old male presenting emergency department with acute on chronic back pain.  He is afebrile nontachycardic, hypertensive.  Does have some red flags on his history with paresthesia to his perineum, reported right lower extremity weakness, bowel incontinence and urinary retention.  However, his physical exam does not demonstrate appreciable neurodeficits in his lower extremities.  Per chart review has had MRIs in the past; was also noted in the ED course.  Basic labs obtained.  No significant metabolic derangements.  Normal kidney function.  UA without obvious signs of infection.  He has no leukocytosis that would suggest infectious process.  He does have polycythemia.  Per chart review this appears to be chronic in nature and of unclear etiology.  Given red flags on history will get MRI of thoracic and lumbar spine to evaluate for spinal cord pathology.  Will also treat with  multimodal pain medications.  See ED course for final MDM/final disposition.  Care signed out to afternoon team; disposition pending MRI results.  Amount and/or Complexity of Data Reviewed Labs: ordered. Radiology: ordered.  Risk OTC drugs. Prescription drug management.         Final Clinical Impression(s) / ED Diagnoses Final diagnoses:  None    Rx / DC Orders ED Discharge Orders     None         Coral Spikes, DO 12/30/23 1541

## 2024-01-02 ENCOUNTER — Encounter: Payer: Self-pay | Admitting: Oncology

## 2024-02-14 ENCOUNTER — Inpatient Hospital Stay: Payer: Medicare HMO | Attending: Oncology

## 2024-02-14 ENCOUNTER — Telehealth: Payer: Self-pay | Admitting: Family Medicine

## 2024-02-14 DIAGNOSIS — E611 Iron deficiency: Secondary | ICD-10-CM | POA: Insufficient documentation

## 2024-02-14 DIAGNOSIS — D751 Secondary polycythemia: Secondary | ICD-10-CM | POA: Insufficient documentation

## 2024-02-14 DIAGNOSIS — Z79899 Other long term (current) drug therapy: Secondary | ICD-10-CM | POA: Insufficient documentation

## 2024-02-14 LAB — CBC WITH DIFFERENTIAL/PLATELET
Abs Immature Granulocytes: 0.02 10*3/uL (ref 0.00–0.07)
Basophils Absolute: 0.1 10*3/uL (ref 0.0–0.1)
Basophils Relative: 1 %
Eosinophils Absolute: 0.1 10*3/uL (ref 0.0–0.5)
Eosinophils Relative: 2 %
HCT: 50.7 % (ref 39.0–52.0)
Hemoglobin: 17.6 g/dL — ABNORMAL HIGH (ref 13.0–17.0)
Immature Granulocytes: 0 %
Lymphocytes Relative: 26 %
Lymphs Abs: 1.3 10*3/uL (ref 0.7–4.0)
MCH: 33.9 pg (ref 26.0–34.0)
MCHC: 34.7 g/dL (ref 30.0–36.0)
MCV: 97.7 fL (ref 80.0–100.0)
Monocytes Absolute: 0.6 10*3/uL (ref 0.1–1.0)
Monocytes Relative: 13 %
Neutro Abs: 3 10*3/uL (ref 1.7–7.7)
Neutrophils Relative %: 58 %
Platelets: 242 10*3/uL (ref 150–400)
RBC: 5.19 MIL/uL (ref 4.22–5.81)
RDW: 12.4 % (ref 11.5–15.5)
WBC: 5.1 10*3/uL (ref 4.0–10.5)
nRBC: 0 % (ref 0.0–0.2)

## 2024-02-14 LAB — IRON AND TIBC
Iron: 150 ug/dL (ref 45–182)
Saturation Ratios: 34 % (ref 17.9–39.5)
TIBC: 443 ug/dL (ref 250–450)
UIBC: 293 ug/dL

## 2024-02-14 LAB — FERRITIN: Ferritin: 73 ng/mL (ref 24–336)

## 2024-02-14 NOTE — Telephone Encounter (Signed)
 PT dropped off Handicap forms to be completed and signed.  Form Fee Paid? (Yes)            If NO, form is placed on front office manager desk to hold until payment received. If YES, then form will be placed in the RX/HH Nurse Coordinators box for completion.  Form will not be processed until payment is received

## 2024-02-15 NOTE — Telephone Encounter (Signed)
 Aware handicap forms ready

## 2024-02-21 ENCOUNTER — Inpatient Hospital Stay: Payer: Medicare HMO | Admitting: Oncology

## 2024-02-24 ENCOUNTER — Inpatient Hospital Stay: Admitting: Oncology

## 2024-02-24 VITALS — BP 128/87 | HR 63 | Temp 97.7°F | Resp 16 | Wt 184.7 lb

## 2024-02-24 DIAGNOSIS — F1090 Alcohol use, unspecified, uncomplicated: Secondary | ICD-10-CM

## 2024-02-24 DIAGNOSIS — E611 Iron deficiency: Secondary | ICD-10-CM

## 2024-02-24 DIAGNOSIS — G8929 Other chronic pain: Secondary | ICD-10-CM | POA: Diagnosis not present

## 2024-02-24 DIAGNOSIS — D751 Secondary polycythemia: Secondary | ICD-10-CM | POA: Diagnosis not present

## 2024-02-24 NOTE — Assessment & Plan Note (Addendum)
 Chronic elevation of hemoglobin since 2011, with a current level of 17.6.  No acute changes. No current testosterone  or steroid use.  Patient has a history of alcohol use and marijuana for pain management.   JAK2 testing negative.  Previously thought to be due to dehydration.  OSA testing negative previously.  EPO levels are normal.  S/p 1 phlebotomy. Patient wishes to follow-up less frequently  -Continue to monitor CBC at this time -Cautiously do phlebotomy as patient has low ferritin levels and can induce iron  deficiency.  Will consider phlebotomy only if he is symptomatic or if hemoglobin >20  Return to clinic in 6 months with labs

## 2024-02-24 NOTE — Assessment & Plan Note (Signed)
 Moderate alcohol intake reported, potential impact on hydration and hemoglobin levels discussed. -Encouraged to monitor and limit alcohol intake.

## 2024-02-24 NOTE — Progress Notes (Signed)
 [facility] at [facility]  HEMATOLOGY FOLLOW-UP VISIT  Vicky Grange M, DO  REASON FOR FOLLOW-UP: Polycythemia  ASSESSMENT & PLAN:  Patient is a 73 year old male with past medical history of alcohol use following for polycythemia   Polycythemia, secondary Chronic elevation of hemoglobin since 2011, with a current level of 17.6.  No acute changes. No current testosterone  or steroid use.  Patient has a history of alcohol use and marijuana for pain management.   JAK2 testing negative.  Previously thought to be due to dehydration.  OSA testing negative previously.  EPO levels are normal.  S/p 1 phlebotomy. Patient wishes to follow-up less frequently  -Continue to monitor CBC at this time -Cautiously do phlebotomy as patient has low ferritin levels and can induce iron  deficiency.  Will consider phlebotomy only if he is symptomatic or if hemoglobin >20  Return to clinic in 6 months with labs  Chronic pain Chronic pain under the right shoulder blade and ribs, possibly related to a fall in 2015. No clear diagnosis despite multiple evaluations.  Patient manages pain with marijuana and occasional alcohol use.  -Encourage follow-up with pain management specialists.  Alcohol use Moderate alcohol intake reported, potential impact on hydration and hemoglobin levels discussed.  -Encouraged to monitor and limit alcohol intake.  Iron  deficiency Patient has low ferritin levels.  Administered IV iron  after phlebotomy. Current levels are stable  -Continue to monitor and replace as needed   Orders Placed This Encounter  Procedures   Ferritin    Standing Status:   Future    Expected Date:   08/20/2024    Expiration Date:   02/23/2025   Folate    Standing Status:   Future    Expected Date:   08/20/2024    Expiration Date:   02/23/2025   CBC with Differential/Platelet    Standing Status:   Future    Expected Date:   08/20/2024    Expiration Date:   02/23/2025    Comprehensive metabolic panel with GFR    Standing Status:   Future    Expected Date:   08/20/2024    Expiration Date:   02/23/2025   Iron  and TIBC    Standing Status:   Future    Expected Date:   08/20/2024    Expiration Date:   02/23/2025    The total time spent in the appointment was 20 minutes encounter with patients including review of chart and various tests results, discussions about plan of care and coordination of care plan   All questions were answered. The patient knows to call the clinic with any problems, questions or concerns. No barriers to learning was detected.  Eduardo Grade, MD 5/30/202511:01 AM   SUMMARY OF HEMATOLOGIC HISTORY: Chronic elevation of hemoglobin since 2011, with a current level of 18.5.   -No current testosterone  or steroid use.  -JAK2 testing negative.   -OSA testing negative previously.   -EPO levels are normal. -Alcohol use and previously related polycythemia to alcohol use and dehydration -s/p 1 phlebotomy on 09/13/23 -s/p IV venofer  400mg  x 2 doses 09/16/23 and 09/27/23  INTERVAL HISTORY: MIKHAIL HALLENBECK 73 y.o. male is here for follow-up for polycythemia.  He continues to report chronic back pain but is overall doing well.  He was recently in the ER for back pain.  He has no other complaints today.  He continues to drink alcohol only when his friends visit.  He denies fatigue, loss of appetite, loss of weight,  fever, chills, rash.  I have reviewed the past medical history, past surgical history, social history and family history with the patient   ALLERGIES:  is allergic to cymbalta  [duloxetine  hcl], lyrica [pregabalin], neurontin [gabapentin], robaxin  [methocarbamol ], sudafed [pseudoephedrine hcl], and epinephrine.  MEDICATIONS:  Current Outpatient Medications  Medication Sig Dispense Refill   amLODipine  (NORVASC ) 10 MG tablet Take 1 tablet (10 mg total) by mouth daily. 100 tablet 4   cyclobenzaprine  (FLEXERIL ) 10 MG tablet Take 1  tablet (10 mg total) by mouth 2 (two) times daily as needed for muscle spasms. 20 tablet 0   levothyroxine  (SYNTHROID ) 150 MCG tablet TAKE 1 TABLET BY MOUTH DAILY  BEFORE BREAKFAST EXCEPT ONE-HALF TABLET BY MOUTH ON Saturday and SUNDAY 135 tablet 3   losartan  (COZAAR ) 50 MG tablet Take 1 tablet (50 mg total) by mouth daily. 100 tablet 3   methylPREDNISolone  (MEDROL  DOSEPAK) 4 MG TBPK tablet Take as instructed by dose packaging 1 each 0   omeprazole  (PRILOSEC) 20 MG capsule TAKE 1 CAPSULE BY MOUTH TWICE  DAILY 200 capsule 0   zinc gluconate 50 MG tablet Take 50 mg by mouth daily.     No current facility-administered medications for this visit.     REVIEW OF SYSTEMS:   Constitutional: Denies fevers, chills or night sweats Eyes: Denies blurriness of vision Ears, nose, mouth, throat, and face: Denies mucositis or sore throat Respiratory: Denies cough, dyspnea or wheezes Cardiovascular: Denies palpitation, chest discomfort or lower extremity swelling Gastrointestinal:  Denies nausea, heartburn or change in bowel habits Skin: Denies abnormal skin rashes Lymphatics: Denies new lymphadenopathy or easy bruising Neurological:Denies numbness, tingling or new weaknesses Behavioral/Psych: Mood is stable, no new changes  All other systems were reviewed with the patient and are negative.  PHYSICAL EXAMINATION:   Vitals:   02/24/24 1033  BP: 128/87  Pulse: 63  Resp: 16  Temp: 97.7 F (36.5 C)  SpO2: 100%    GENERAL:alert, no distress and comfortable LUNGS: clear to auscultation and percussion with normal breathing effort HEART: regular rate & rhythm and no murmurs and no lower extremity edema ABDOMEN:abdomen soft, non-tender and normal bowel sounds Musculoskeletal:no cyanosis of digits and no clubbing  NEURO: alert & oriented x 3 with fluent speech   LABORATORY DATA:  I have reviewed the data as listed  Lab Results  Component Value Date   WBC 5.1 02/14/2024   NEUTROABS 3.0  02/14/2024   HGB 17.6 (H) 02/14/2024   HCT 50.7 02/14/2024   MCV 97.7 02/14/2024   PLT 242 02/14/2024       Chemistry      Component Value Date/Time   NA 139 12/30/2023 1216   NA 140 07/25/2023 0855   K 3.7 12/30/2023 1216   CL 103 12/30/2023 1216   CO2 29 12/30/2023 1216   BUN 11 12/30/2023 1216   BUN 12 07/25/2023 0855   CREATININE 1.06 12/30/2023 1216   CREATININE 1.27 (H) 04/03/2020 0855   CREATININE 1.30 01/01/2013 0935      Component Value Date/Time   CALCIUM  9.1 12/30/2023 1216   CALCIUM  9.6 11/08/2019 1334   ALKPHOS 88 11/17/2023 1152   AST 19 11/17/2023 1152   AST 19 04/03/2020 0855   ALT 20 11/17/2023 1152   ALT 27 04/03/2020 0855   BILITOT 1.9 (H) 11/17/2023 1152   BILITOT 1.7 (H) 07/25/2023 0855   BILITOT 1.7 (H) 04/03/2020 0855      Latest Reference Range & Units 08/12/23 10:38  Erythropoietin  2.6 - 18.5  mIU/mL 5.8    Latest Reference Range & Units 01/01/13 09:27 07/04/13 09:07 05/22/15 08:49 03/10/18 09:14 08/12/23 10:38  Testosterone  264 - 916 ng/dL 409 811 914 782 956    Latest Reference Range & Units 02/14/24 09:44  Iron  45 - 182 ug/dL 213  UIBC ug/dL 086  TIBC 578 - 469 ug/dL 629  Saturation Ratios 17.9 - 39.5 % 34  Ferritin 24 - 336 ng/mL 73

## 2024-02-24 NOTE — Assessment & Plan Note (Signed)
 Patient has low ferritin levels.  Administered IV iron  after phlebotomy. Current levels are stable  -Continue to monitor and replace as needed

## 2024-02-24 NOTE — Assessment & Plan Note (Signed)
 Chronic pain under the right shoulder blade and ribs, possibly related to a fall in 2015. No clear diagnosis despite multiple evaluations. Patient manages pain with marijuana and occasional alcohol use. -Encourage follow-up with pain management specialists.

## 2024-03-16 ENCOUNTER — Ambulatory Visit: Payer: Medicare HMO

## 2024-03-16 VITALS — BP 120/70 | Ht 72.0 in | Wt 187.0 lb

## 2024-03-16 DIAGNOSIS — Z0000 Encounter for general adult medical examination without abnormal findings: Secondary | ICD-10-CM

## 2024-03-16 NOTE — Progress Notes (Signed)
 Subjective:   Jacob Kerr is a 73 y.o. who presents for a Medicare Wellness preventive visit.  As a reminder, Annual Wellness Visits don't include a physical exam, and some assessments may be limited, especially if this visit is performed virtually. We may recommend an in-person follow-up visit with your provider if needed.  Visit Complete: Virtual I connected with  Jacob Kerr on 03/16/24 by a audio enabled telemedicine application and verified that I am speaking with the correct person using two identifiers.  Patient Location: Home  Provider Location: Home Office  I discussed the limitations of evaluation and management by telemedicine. The patient expressed understanding and agreed to proceed.  Vital Signs: Because this visit was a virtual/telehealth visit, some criteria may be missing or patient reported. Any vitals not documented were not able to be obtained and vitals that have been documented are patient reported.  VideoDeclined- This patient declined Librarian, academic. Therefore the visit was completed with audio only.  Persons Participating in Visit: Patient.  AWV Questionnaire: No: Patient Medicare AWV questionnaire was not completed prior to this visit.  Cardiac Risk Factors include: advanced age (>5men, >47 women);hypertension;male gender     Objective:    Today's Vitals   03/16/24 0845 03/16/24 0846  BP: 120/70   Weight: 187 lb (84.8 kg)   Height: 6' (1.829 m)   PainSc:  6    Body mass index is 25.36 kg/m.     03/16/2024    8:47 AM 02/24/2024   10:36 AM 11/24/2023    1:57 PM 09/16/2023   11:09 AM 08/24/2023    2:10 PM 08/12/2023   10:10 AM 03/16/2023    2:21 PM  Advanced Directives  Does Patient Have a Medical Advance Directive? No No No No No No No  Would patient like information on creating a medical advance directive? Yes (MAU/Ambulatory/Procedural Areas - Information given) No - Patient declined No - Patient  declined No - Patient declined No - Patient declined No - Patient declined No - Patient declined    Current Medications (verified) Outpatient Encounter Medications as of 03/16/2024  Medication Sig   amLODipine  (NORVASC ) 10 MG tablet Take 1 tablet (10 mg total) by mouth daily.   levothyroxine  (SYNTHROID ) 150 MCG tablet TAKE 1 TABLET BY MOUTH DAILY  BEFORE BREAKFAST EXCEPT ONE-HALF TABLET BY MOUTH ON Saturday and SUNDAY   losartan  (COZAAR ) 50 MG tablet Take 1 tablet (50 mg total) by mouth daily.   omeprazole  (PRILOSEC) 20 MG capsule TAKE 1 CAPSULE BY MOUTH TWICE  DAILY   zinc gluconate 50 MG tablet Take 50 mg by mouth daily.   cyclobenzaprine  (FLEXERIL ) 10 MG tablet Take 1 tablet (10 mg total) by mouth 2 (two) times daily as needed for muscle spasms. (Patient not taking: Reported on 03/16/2024)   methylPREDNISolone  (MEDROL  DOSEPAK) 4 MG TBPK tablet Take as instructed by dose packaging   No facility-administered encounter medications on file as of 03/16/2024.    Allergies (verified) Cymbalta  [duloxetine  hcl], Lyrica [pregabalin], Neurontin [gabapentin], Robaxin  [methocarbamol ], Sudafed [pseudoephedrine hcl], and Epinephrine   History: Past Medical History:  Diagnosis Date   Acute meniscal tear of left knee    Allergic rhinitis    Anal fissure    Anxiety    BPH (benign prostatic hypertrophy)    Carpal tunnel syndrome of right wrist    Cervical spondylosis without myelopathy    Cervicogenic headache    Chronic headaches    Chronic neck pain  uses TENS unit  prn   Diverticulitis    Gallstones    GERD (gastroesophageal reflux disease)    H/O hiatal hernia    History of diverticulitis    History of kidney stones    History of TIA (transient ischemic attack)    04/ 2011---  no residuals   Hypertension    Hypothyroidism    Peripheral neuropathy    Shingles    Syrinx of spinal cord (HCC)    C4 -- C7   Past Surgical History:  Procedure Laterality Date   CARDIAC CATHETERIZATION   02-18-2010   dr Abel Hoe   normal coronary arteries/  normal lvsf--  ef 65%   COLONOSCOPY     HEMORRHOID SURGERY  1992   HERNIA REPAIR  2018   [facility] [place] [facility]   KNEE ARTHROSCOPY Left 03/13/2014   Procedure: LEFT ARTHROSCOPY KNEE WITH DEBRIDMENT;  Surgeon: Aurther Blue, MD;  Location: [facility];  Service: Orthopedics;  Laterality: Left;   LAPAROSCOPIC CHOLECYSTECTOMY  01/30/2003   NEGATIVE SLEEP STUDY  2013   per pt   RADIOFREQUENCY ABLATION NERVES  05/2016   REMOVAL FORGEIN BODY FROM EAR  AGE 65   RIGHT URETEROSCOPIC LASER LITHOTRIPSY STONE EXTRACTION /  STENT PLACEMENT  05/10/2000   TRANSTHORACIC ECHOCARDIOGRAM  01/16/2010   normal lvf/  ef  60%/  mild lae   Family History  Problem Relation Age of Onset   Heart disease Mother    Prostate cancer Father    Heart disease Father    Stroke Father    Nephrolithiasis Sister    Cancer Sister        Intestinal   Hyperlipidemia Sister    Hyperlipidemia Sister    Cancer Sister        Sinus   Colon polyps Sister    Diabetes Brother    Colon polyps Brother        x2   COPD Other    Colon cancer Neg Hx    Esophageal cancer Neg Hx    Social History   Socioeconomic History   Marital status: Married    Spouse name: Raynelle Callow   Number of children: 2   Years of education: 12   Highest education level: 12th grade  Occupational History   Occupation: Retired    Associate Professor: Lexicographer  Tobacco Use   Smoking status: Never   Smokeless tobacco: Never  Vaping Use   Vaping status: Never Used  Substance and Sexual Activity   Alcohol use: Yes    Alcohol/week: 7.0 standard drinks of alcohol    Types: 7 Shots of liquor per week    Comment: Quit half gallon of liquor per week on 12/09/22. Trying to have 1 beer or less nightly as of 04/18/23   Drug use: Yes    Frequency: 7.0 times per week    Types: Marijuana    Comment: started use after retiring, usually two puffs per day for back pain. Cutting back as of 02/15/23    Sexual activity: Not on file  Other Topics Concern   Not on file  Social History Narrative   Lives at home with wife.       Patient is right handed.   Patient drinks 1-2 cups of caffeine daily.      03/16/2024  does yoga. Very active with family and friends and around home   Social Drivers of Health   Financial Resource Strain: Low Risk  (03/16/2024)   Overall Physicist, medical  Strain (CARDIA)    Difficulty of Paying Living Expenses: Not hard at all  Food Insecurity: No Food Insecurity (03/16/2024)   Hunger Vital Sign    Worried About Running Out of Food in the Last Year: Never true    Ran Out of Food in the Last Year: Never true  Transportation Needs: No Transportation Needs (03/16/2024)   PRAPARE - Administrator, Civil Service (Medical): No    Lack of Transportation (Non-Medical): No  Physical Activity: Sufficiently Active (03/16/2024)   Exercise Vital Sign    Days of Exercise per Week: 7 days    Minutes of Exercise per Session: 30 min  Stress: No Stress Concern Present (03/16/2024)   Jacob Kerr of Occupational Health - Occupational Stress Questionnaire    Feeling of Stress: Not at all  Social Connections: Moderately Isolated (03/16/2024)   Social Connection and Isolation Panel    Frequency of Communication with Friends and Family: More than three times a week    Frequency of Social Gatherings with Friends and Family: More than three times a week    Attends Religious Services: Never    Database administrator or Organizations: No    Attends Engineer, structural: Never    Marital Status: Married    Tobacco Counseling Counseling given: Yes    Clinical Intake:  Pre-visit preparation completed: Yes  Pain : 0-10 Pain Score: 6  Pain Type: Chronic pain Pain Location: Back Pain Orientation: Upper Pain Descriptors / Indicators: Aching, Constant, Throbbing, Sore Pain Onset: More than a month ago Pain Frequency: Constant     BMI - recorded:  25.36 Nutritional Status: BMI 25 -29 Overweight Nutritional Risks: None Diabetes: No  Lab Results  Component Value Date   HGBA1C 5.6 09/01/2016   HGBA1C 5.6 09/02/2012   HGBA1C (H) 01/15/2010    5.8 (NOTE)                                                                       According to the ADA Clinical Practice Recommendations for 2011, when HbA1c is used as a screening test:   >=6.5%   Diagnostic of Diabetes Mellitus           (if abnormal result  is confirmed)  5.7-6.4%   Increased risk of developing Diabetes Mellitus  References:Diagnosis and Classification of Diabetes Mellitus,Diabetes Care,2011,34(Suppl 1):S62-S69 and Standards of Medical Care in         Diabetes - 2011,Diabetes Care,2011,34  (Suppl 1):S11-S61.     How often do you need to have someone help you when you read instructions, pamphlets, or other written materials from your doctor or pharmacy?: 1 - Never  Interpreter Needed?: No  Information entered by :: Jacob Kerr CMA   Activities of Daily Living     03/16/2024    8:59 AM  In your present state of health, do you have any difficulty performing the following activities:  Hearing? 0  Vision? 0  Difficulty concentrating or making decisions? 0  Walking or climbing stairs? 0  Dressing or bathing? 0  Doing errands, shopping? 0  Preparing Food and eating ? N  Using the Toilet? N  In the past six months, have you accidently leaked urine? N  Do you have problems with loss of bowel control? N  Managing your Medications? N  Managing your Finances? N  Housekeeping or managing your Housekeeping? N    Patient Care Team: Jacob Guerin, DO as PCP - General (Family Medicine) Jacob Guerin, DO as Consulting Physician (Family Medicine) Jacob Grates, MD as Consulting Physician (Cardiology)  I have updated your Care Teams any recent Medical Services you may have received from other providers in the past year.     Assessment:   This is a routine wellness  examination for Jacob Kerr.  Hearing/Vision screen Hearing Screening - Comments:: Wears hearing aids. Sees [name] Vision Screening - Comments:: Wears rx glasses - up to date with routine eye exams  Sees [facility]   Goals Addressed             This Visit's Progress    Patient Stated       I want to finish digging out my fish pond        Depression Screen     03/16/2024    8:48 AM 07/27/2023   11:49 AM 03/16/2023    2:20 PM 12/16/2022   11:24 AM 07/16/2022   11:28 AM 03/10/2022    2:11 PM 02/18/2022   10:52 AM  PHQ 2/9 Scores  PHQ - 2 Score 0 0 0  0 0 0  PHQ- 9 Score 3 3          Information is confidential and restricted. Go to Review Flowsheets to unlock data.    Fall Risk     03/16/2024    9:02 AM 07/27/2023   11:48 AM 03/16/2023    2:18 PM 07/16/2022   11:28 AM 03/10/2022    2:15 PM  Fall Risk   Falls in the past year? 0 0 0 0 1  Number falls in past yr: 0 0 0  1  Injury with Fall? 0 0 0  0  Risk for fall due to : No Fall Risks No Fall Risks No Fall Risks  History of fall(s);Impaired balance/gait  Risk for fall due to: Comment may have tripped while walking in the woods while hunting      Follow up Falls evaluation completed;Education provided;Falls prevention discussed Education provided Falls prevention discussed  Falls prevention discussed      Data saved with a previous flowsheet row definition    MEDICARE RISK AT HOME:  Medicare Risk at Home Any stairs in or around the home?: No If so, are there any without handrails?: No Home free of loose throw rugs in walkways, pet beds, electrical cords, etc?: Yes Adequate lighting in your home to reduce risk of falls?: Yes Life alert?: No Use of a cane, walker or w/c?: No Grab bars in the bathroom?: No Shower chair or bench in shower?: No Elevated toilet seat or a handicapped toilet?: No  TIMED UP AND GO:  Was the test performed?  No  Cognitive Function: 6CIT completed         03/16/2024    9:03 AM 03/16/2023    2:21 PM 03/10/2022    2:19 PM 02/07/2020    2:39 PM 02/06/2019    3:58 PM  6CIT Screen  What Year? 0 points 0 points 0 points 0 points 0 points  What month? 0 points 0 points 0 points 0 points 0 points  What time? 0 points 0 points 0 points 0 points 0 points  Count back from 20 0 points  0 points 0 points 0 points 0 points  Months in reverse 0 points 0 points 2 points 0 points 2 points  Repeat phrase 0 points 0 points 0 points 0 points 0 points  Total Score 0 points 0 points 2 points 0 points 2 points    Immunizations Immunization History  Administered Date(s) Administered   Influenza Whole 06/27/2012   Influenza, High Dose Seasonal PF 07/21/2018   Influenza,inj,Quad PF,6+ Mos 07/04/2013, 07/12/2014, 07/31/2015, 08/18/2016   Influenza-Unspecified 07/13/2017   Pneumococcal Conjugate-13 03/07/2018   Pneumococcal Polysaccharide-23 03/28/2019   Tdap 09/27/2005, 06/23/2011   Zoster, Live 05/20/2014    Screening Tests Health Maintenance  Topic Date Due   COVID-19 Vaccine (1) Never done   Zoster Vaccines- Shingrix (1 of 2) 09/13/1970   DTaP/Tdap/Td (3 - Td or Tdap) 07/26/2024 (Originally 06/22/2021)   INFLUENZA VACCINE  04/27/2024   Colonoscopy  12/29/2024   Medicare Annual Wellness (AWV)  03/16/2025   Pneumococcal Vaccine: 50+ Years  Completed   Hepatitis C Screening  Completed   HPV VACCINES  Aged Out   Meningococcal B Vaccine  Aged Out    Health Maintenance  Health Maintenance Due  Topic Date Due   COVID-19 Vaccine (1) Never done   Zoster Vaccines- Shingrix (1 of 2) 09/13/1970   Health Maintenance Items Addressed: Discussed recommended vaccines that are due and where he can have those done at.   Additional Screening:  Vision Screening: Recommended annual ophthalmology exams for early detection of glaucoma and other disorders of the eye. Would you like a referral to an eye doctor? No    Dental Screening: Recommended annual dental  exams for proper oral hygiene  Community Resource Referral / Chronic Care Management: CRR required this visit?  No   CCM required this visit?  No   Plan:    I have personally reviewed and noted the following in the patient's chart:   Medical and social history Use of alcohol, tobacco or illicit drugs  Current medications and supplements including opioid prescriptions. Patient is not currently taking opioid prescriptions. Functional ability and status Nutritional status Physical activity Advanced directives List of other physicians Hospitalizations, surgeries, and ER visits in previous 12 months Vitals Screenings to include cognitive, depression, and falls Referrals and appointments  In addition, I have reviewed and discussed with patient certain preventive protocols, quality metrics, and best practice recommendations. A written personalized care plan for preventive services as well as general preventive health recommendations were provided to patient.   Jacob Kerr, CMA   03/16/2024   After Visit Summary: (MyChart) Due to this being a telephonic visit, the after visit summary with patients personalized plan was offered to patient via MyChart   Notes: Nothing significant to report at this time.

## 2024-03-16 NOTE — Patient Instructions (Signed)
 Jacob Kerr ,  Thank you for taking time out of your busy schedule to complete your Annual Wellness Visit with me. I enjoyed our conversation and look forward to speaking with you again next year. I, as well as your care team,  appreciate your ongoing commitment to your health goals. Please review the following plan we discussed and let me know if I can assist you in the future.  I enjoyed our conversation and look forward to it again next year. Blessing for the upcoming year!!  -Lanay Zinda  Your Game plan/ To Do List    Follow up Visits: 1 Year Follow Up AWV: March 19, 2025 at 9:20 am video visit   Next appointment with PCP: May 02, 2024 at 11:30 in office  Clinician Recommendations:  Aim for 30 minutes of exercise or brisk walking, 6-8 glasses of water, and 5 servings of fruits and vegetables each day.       This is a list of the screening recommended for you and due dates:  Health Maintenance  Topic Date Due   COVID-19 Vaccine (1) Never done   Zoster (Shingles) Vaccine (1 of 2) 09/13/1970   DTaP/Tdap/Td vaccine (3 - Td or Tdap) 07/26/2024*   Flu Shot  04/27/2024   Colon Cancer Screening  12/29/2024   Medicare Annual Wellness Visit  03/16/2025   Pneumococcal Vaccine for age over 65  Completed   Hepatitis C Screening  Completed   HPV Vaccine  Aged Out   Meningitis B Vaccine  Aged Out  *Topic was postponed. The date shown is not the original due date.    Advanced directives: (Provided) Advance directive discussed with you today. I have provided a copy for you to complete at home and have notarized. Once this is complete, please bring a copy in to our office so we can scan it into your chart.  Advance Care Planning is important because it:  [x]  Makes sure you receive the medical care that is consistent with your values, goals, and preferences  [x]  It provides guidance to your family and loved ones and reduces their decisional burden about whether or not they are making the right  decisions based on your wishes.  Follow the link provided in your after visit summary or read over the paperwork we have mailed to you to help you started getting your Advance Directives in place. If you need assistance in completing these, please reach out to us  so that we can help you!  Understanding Your Risk for Falls Millions of people have serious injuries from falls each year. It is important to understand your risk of falling. Talk with your health care provider about your risk and what you can do to lower it. If you do have a serious fall, make sure to tell your provider. Falling once raises your risk of falling again. How can falls affect me? Serious injuries from falls are common. These include: Broken bones, such as hip fractures. Head injuries, such as traumatic brain injuries (TBI) or concussions. A fear of falling can cause you to avoid activities and stay at home. This can make your muscles weaker and raise your risk for a fall. What can increase my risk? There are a number of risk factors that increase your risk for falling. The more risk factors you have, the higher your risk of falling. Serious injuries from a fall happen most often to people who are older than 73 years old. Teenagers and young adults ages 60-29 are also at  higher risk. Common risk factors include: Weakness in the lower body. Being generally weak or confused due to long-term (chronic) illness. Dizziness or balance problems. Poor vision. Medicines that cause dizziness or drowsiness. These may include: Medicines for your blood pressure, heart, anxiety, insomnia, or swelling (edema). Pain medicines. Muscle relaxants. Other risk factors include: Drinking alcohol. Having had a fall in the past. Having foot pain or wearing improper footwear. Working at a dangerous job. Having any of the following in your home: Tripping hazards, such as floor clutter or loose rugs. Poor lighting. Pets. Having dementia or  memory loss. What actions can I take to lower my risk of falling?     Physical activity Stay physically fit. Do strength and balance exercises. Consider taking a regular class to build strength and balance. Yoga and tai chi are good options. Vision Have your eyes checked every year and your prescription for glasses or contacts updated as needed. Shoes and walking aids Wear non-skid shoes. Wear shoes that have rubber soles and low heels. Do not wear high heels. Do not walk around the house in socks or slippers. Use a cane or walker as told by your provider. Home safety Attach secure railings on both sides of your stairs. Install grab bars for your bathtub, shower, and toilet. Use a non-skid mat in your bathtub or shower. Attach bath mats securely with double-sided, non-slip rug tape. Use good lighting in all rooms. Keep a flashlight near your bed. Make sure there is a clear path from your bed to the bathroom. Use night-lights. Do not use throw rugs. Make sure all carpeting is taped or tacked down securely. Remove all clutter from walkways and stairways, including extension cords. Repair uneven or broken steps and floors. Avoid walking on icy or slippery surfaces. Walk on the grass instead of on icy or slick sidewalks. Use ice melter to get rid of ice on walkways in the winter. Use a cordless phone. Questions to ask your health care provider Can you help me check my risk for a fall? Do any of my medicines make me more likely to fall? Should I take a vitamin D  supplement? What exercises can I do to improve my strength and balance? Should I make an appointment to have my vision checked? Do I need a bone density test to check for weak bones (osteoporosis)? Would it help to use a cane or a walker? Where to find more information Centers for Disease Control and Prevention, STEADI: TonerPromos.no Community-Based Fall Prevention Programs: TonerPromos.no [employer] on Aging: BaseRingTones.pl Contact a  health care provider if: You fall at home. You are afraid of falling at home. You feel weak, drowsy, or dizzy. This information is not intended to replace advice given to you by your health care provider. Make sure you discuss any questions you have with your health care provider. Document Revised: 05/17/2022 Document Reviewed: 05/17/2022 Elsevier Patient Education  2024 [employer].

## 2024-05-02 ENCOUNTER — Ambulatory Visit (INDEPENDENT_AMBULATORY_CARE_PROVIDER_SITE_OTHER): Admitting: Family Medicine

## 2024-05-02 ENCOUNTER — Encounter: Payer: Self-pay | Admitting: Family Medicine

## 2024-05-02 VITALS — BP 123/69 | HR 60 | Temp 97.7°F | Ht 72.0 in | Wt 177.8 lb

## 2024-05-02 DIAGNOSIS — I10 Essential (primary) hypertension: Secondary | ICD-10-CM | POA: Diagnosis not present

## 2024-05-02 DIAGNOSIS — E034 Atrophy of thyroid (acquired): Secondary | ICD-10-CM

## 2024-05-02 MED ORDER — LEVOTHYROXINE SODIUM 150 MCG PO TABS: ORAL_TABLET | ORAL | 3 refills | Status: AC

## 2024-05-02 MED ORDER — AMLODIPINE BESYLATE 10 MG PO TABS: 10.0000 mg | ORAL_TABLET | Freq: Every day | ORAL | 4 refills | Status: AC

## 2024-05-02 MED ORDER — LOSARTAN POTASSIUM 50 MG PO TABS: 50.0000 mg | ORAL_TABLET | Freq: Every day | ORAL | 3 refills | Status: AC

## 2024-05-02 NOTE — Progress Notes (Signed)
 Subjective: CC: Hypothyroidism PCP: Jolinda Norene HERO, DO YEP:Jacob Kerr is a 73 y.o. male presenting to clinic today for:  1.  Hypothyroidism Compliant with Synthroid .  No reports of heart palpitations.  Bowel habits have not changed but he does have loose stools occasionally.  Nonbloody.  No problems swallowing or difficulty with voice changes etc.  2.  Hypertension Compliant with Norvasc  and Cozaar .  No reports of chest pain or shortness of breath.  Continues to stay physically active and has been painting a home recently   ROS: Per HPI  Allergies  Allergen Reactions   Cymbalta  [Duloxetine  Hcl] Other (See Comments)    Depression   Lyrica [Pregabalin] Other (See Comments)    Paranoid   Neurontin [Gabapentin] Other (See Comments)    nightmares   Robaxin  [Methocarbamol ] Other (See Comments)    Seeing bright lights   Sudafed [Pseudoephedrine Hcl] Other (See Comments)    Increased heart rate   Epinephrine Palpitations    Decreased blood pressure   Past Medical History:  Diagnosis Date   Acute meniscal tear of left knee    Allergic rhinitis    Anal fissure    Anxiety    BPH (benign prostatic hypertrophy)    Carpal tunnel syndrome of right wrist    Cervical spondylosis without myelopathy    Cervicogenic headache    Chronic headaches    Chronic neck pain    uses TENS unit  prn   Diverticulitis    Gallstones    GERD (gastroesophageal reflux disease)    H/O hiatal hernia    History of diverticulitis    History of kidney stones    History of TIA (transient ischemic attack)    04/ 2011---  no residuals   Hypertension    Hypothyroidism    Peripheral neuropathy    Shingles    Syrinx of spinal cord (HCC)    C4 -- C7    Current Outpatient Medications:    omeprazole  (PRILOSEC) 20 MG capsule, TAKE 1 CAPSULE BY MOUTH TWICE  DAILY, Disp: 200 capsule, Rfl: 0   zinc gluconate 50 MG tablet, Take 50 mg by mouth daily., Disp: , Rfl:    amLODipine  (NORVASC ) 10 MG  tablet, Take 1 tablet (10 mg total) by mouth daily., Disp: 100 tablet, Rfl: 4   levothyroxine  (SYNTHROID ) 150 MCG tablet, TAKE 1 TABLET BY MOUTH DAILY  BEFORE BREAKFAST EXCEPT ONE-HALF TABLET BY MOUTH ON Saturday and SUNDAY, Disp: 135 tablet, Rfl: 3   losartan  (COZAAR ) 50 MG tablet, Take 1 tablet (50 mg total) by mouth daily., Disp: 100 tablet, Rfl: 3 Social History   Socioeconomic History   Marital status: Married    Spouse name: Particia   Number of children: 2   Years of education: 12   Highest education level: 12th grade  Occupational History   Occupation: Retired    Associate Professor: Lexicographer  Tobacco Use   Smoking status: Never   Smokeless tobacco: Never  Vaping Use   Vaping status: Never Used  Substance and Sexual Activity   Alcohol use: Yes    Alcohol/week: 7.0 standard drinks of alcohol    Types: 7 Shots of liquor per week    Comment: Quit half gallon of liquor per week on 12/09/22. Trying to have 1 beer or less nightly as of 04/18/23   Drug use: Yes    Frequency: 7.0 times per week    Types: Marijuana    Comment: started use after retiring, usually two puffs  per day for back pain. Cutting back as of 02/15/23   Sexual activity: Not on file  Other Topics Concern   Not on file  Social History Narrative   Lives at home with wife.       Patient is right handed.   Patient drinks 1-2 cups of caffeine daily.      03/16/2024  does yoga. Very active with family and friends and around home   Social Drivers of Health   Financial Resource Strain: Low Risk  (05/01/2024)   Overall Financial Resource Strain (CARDIA)    Difficulty of Paying Living Expenses: Not very hard  Food Insecurity: No Food Insecurity (05/01/2024)   Hunger Vital Sign    Worried About Running Out of Food in the Last Year: Never true    Ran Out of Food in the Last Year: Never true  Transportation Needs: No Transportation Needs (05/01/2024)   PRAPARE - Administrator, Civil Service (Medical): No    Lack of  Transportation (Non-Medical): No  Physical Activity: Insufficiently Active (05/01/2024)   Exercise Vital Sign    Days of Exercise per Week: 3 days    Minutes of Exercise per Session: 20 min  Stress: No Stress Concern Present (05/01/2024)   [employer] of Occupational Health - Occupational Stress Questionnaire    Feeling of Stress: Only a little  Social Connections: Moderately Isolated (05/01/2024)   Social Connection and Isolation Panel    Frequency of Communication with Friends and Family: More than three times a week    Frequency of Social Gatherings with Friends and Family: More than three times a week    Attends Religious Services: Never    Database administrator or Organizations: No    Attends Engineer, structural: Not on file    Marital Status: Married  Catering manager Violence: Not At Risk (03/16/2024)   Humiliation, Afraid, Rape, and Kick questionnaire    Fear of Current or Ex-Partner: No    Emotionally Abused: No    Physically Abused: No    Sexually Abused: No   Family History  Problem Relation Age of Onset   Heart disease Mother    Prostate cancer Father    Heart disease Father    Stroke Father    Nephrolithiasis Sister    Cancer Sister        Intestinal   Hyperlipidemia Sister    Hyperlipidemia Sister    Cancer Sister        Sinus   Colon polyps Sister    Diabetes Brother    Colon polyps Brother        x2   COPD Other    Colon cancer Neg Hx    Esophageal cancer Neg Hx     Objective: Office vital signs reviewed. BP 123/69   Pulse 60   Temp 97.7 F (36.5 C) (Temporal)   Ht 6' (1.829 m)   Wt 177 lb 12.8 oz (80.6 kg)   SpO2 98%   BMI 24.11 kg/m   Physical Examination:  General: Awake, alert, well nourished, No acute distress HEENT: sclera white, MMM.  Full feeling thyroid  but no discrete nodules or masses Cardio: regular rate and rhythm, S1S2 heard, no murmurs appreciated Pulm: clear to auscultation bilaterally, no wheezes, rhonchi or  rales; normal work of breathing on room air MSK: Stiff.  Station normal. Neuro: Occasional tremor observed  Assessment/ Plan: 73 y.o. male   Hypothyroidism due to acquired atrophy of thyroid  - Plan: TSH +  free T4, levothyroxine  (SYNTHROID ) 150 MCG tablet  Essential hypertension - Plan: amLODipine  (NORVASC ) 10 MG tablet, losartan  (COZAAR ) 50 MG tablet  Clinically euthyroid.  Thyroid  medicine renewed.  Blood pressure is controlled.  Med renewed.  Follow-up in the next 6 to 12 months for annual physical with fasting labs   Paislie Tessler CHRISTELLA Fielding, DO [facility] [phone]

## 2024-05-03 ENCOUNTER — Ambulatory Visit: Payer: Self-pay | Admitting: Family Medicine

## 2024-05-03 LAB — TSH+FREE T4
Free T4: 1.57 ng/dL (ref 0.82–1.77)
TSH: 1.93 u[IU]/mL (ref 0.450–4.500)

## 2024-07-05 DIAGNOSIS — L821 Other seborrheic keratosis: Secondary | ICD-10-CM | POA: Diagnosis not present

## 2024-07-05 DIAGNOSIS — D2372 Other benign neoplasm of skin of left lower limb, including hip: Secondary | ICD-10-CM | POA: Diagnosis not present

## 2024-07-05 DIAGNOSIS — Z85828 Personal history of other malignant neoplasm of skin: Secondary | ICD-10-CM | POA: Diagnosis not present

## 2024-07-05 DIAGNOSIS — D225 Melanocytic nevi of trunk: Secondary | ICD-10-CM | POA: Diagnosis not present

## 2024-07-05 DIAGNOSIS — L579 Skin changes due to chronic exposure to nonionizing radiation, unspecified: Secondary | ICD-10-CM | POA: Diagnosis not present

## 2024-07-05 DIAGNOSIS — L814 Other melanin hyperpigmentation: Secondary | ICD-10-CM | POA: Diagnosis not present

## 2024-08-14 ENCOUNTER — Other Ambulatory Visit (HOSPITAL_COMMUNITY): Payer: Self-pay | Admitting: Chiropractic Medicine

## 2024-08-14 DIAGNOSIS — M546 Pain in thoracic spine: Secondary | ICD-10-CM

## 2024-08-14 DIAGNOSIS — M542 Cervicalgia: Secondary | ICD-10-CM

## 2024-08-14 DIAGNOSIS — M5440 Lumbago with sciatica, unspecified side: Secondary | ICD-10-CM

## 2024-08-15 ENCOUNTER — Encounter: Payer: Self-pay | Admitting: Oncology

## 2024-08-19 ENCOUNTER — Ambulatory Visit (HOSPITAL_COMMUNITY)
Admission: RE | Admit: 2024-08-19 | Discharge: 2024-08-19 | Disposition: A | Discharge status: Home / Self Care | Source: Ambulatory Visit | Attending: Chiropractic Medicine | Admitting: Chiropractic Medicine

## 2024-08-19 ENCOUNTER — Ambulatory Visit (HOSPITAL_COMMUNITY)
Admission: RE | Admit: 2024-08-19 | Discharge: 2024-08-19 | Disposition: A | Source: Ambulatory Visit | Attending: Chiropractic Medicine | Admitting: Chiropractic Medicine

## 2024-08-19 DIAGNOSIS — M546 Pain in thoracic spine: Secondary | ICD-10-CM

## 2024-08-19 DIAGNOSIS — M419 Scoliosis, unspecified: Secondary | ICD-10-CM | POA: Diagnosis not present

## 2024-08-19 DIAGNOSIS — R296 Repeated falls: Secondary | ICD-10-CM

## 2024-08-19 DIAGNOSIS — M542 Cervicalgia: Secondary | ICD-10-CM | POA: Insufficient documentation

## 2024-08-19 DIAGNOSIS — G950 Syringomyelia and syringobulbia: Secondary | ICD-10-CM

## 2024-08-19 DIAGNOSIS — M5440 Lumbago with sciatica, unspecified side: Secondary | ICD-10-CM | POA: Insufficient documentation

## 2024-08-27 ENCOUNTER — Inpatient Hospital Stay: Attending: Physician Assistant

## 2024-08-27 DIAGNOSIS — N400 Enlarged prostate without lower urinary tract symptoms: Secondary | ICD-10-CM | POA: Diagnosis not present

## 2024-08-27 DIAGNOSIS — E039 Hypothyroidism, unspecified: Secondary | ICD-10-CM | POA: Diagnosis not present

## 2024-08-27 DIAGNOSIS — M898X9 Other specified disorders of bone, unspecified site: Secondary | ICD-10-CM | POA: Insufficient documentation

## 2024-08-27 DIAGNOSIS — Z8042 Family history of malignant neoplasm of prostate: Secondary | ICD-10-CM | POA: Diagnosis not present

## 2024-08-27 DIAGNOSIS — D751 Secondary polycythemia: Secondary | ICD-10-CM | POA: Insufficient documentation

## 2024-08-27 DIAGNOSIS — Z87442 Personal history of urinary calculi: Secondary | ICD-10-CM | POA: Diagnosis not present

## 2024-08-27 DIAGNOSIS — Z888 Allergy status to other drugs, medicaments and biological substances status: Secondary | ICD-10-CM | POA: Diagnosis not present

## 2024-08-27 DIAGNOSIS — Z79890 Hormone replacement therapy (postmenopausal): Secondary | ICD-10-CM | POA: Insufficient documentation

## 2024-08-27 DIAGNOSIS — Z7982 Long term (current) use of aspirin: Secondary | ICD-10-CM | POA: Insufficient documentation

## 2024-08-27 DIAGNOSIS — Z79899 Other long term (current) drug therapy: Secondary | ICD-10-CM | POA: Diagnosis not present

## 2024-08-27 DIAGNOSIS — Z8673 Personal history of transient ischemic attack (TIA), and cerebral infarction without residual deficits: Secondary | ICD-10-CM | POA: Insufficient documentation

## 2024-08-27 DIAGNOSIS — E611 Iron deficiency: Secondary | ICD-10-CM | POA: Diagnosis not present

## 2024-08-27 DIAGNOSIS — I10 Essential (primary) hypertension: Secondary | ICD-10-CM | POA: Diagnosis not present

## 2024-08-27 DIAGNOSIS — Z9049 Acquired absence of other specified parts of digestive tract: Secondary | ICD-10-CM | POA: Diagnosis not present

## 2024-08-27 LAB — COMPREHENSIVE METABOLIC PANEL WITH GFR
ALT: 31 U/L (ref 0–44)
AST: 21 U/L (ref 15–41)
Albumin: 4.5 g/dL (ref 3.5–5.0)
Alkaline Phosphatase: 114 U/L (ref 38–126)
Anion gap: 10 (ref 5–15)
BUN: 12 mg/dL (ref 8–23)
CO2: 28 mmol/L (ref 22–32)
Calcium: 9.2 mg/dL (ref 8.9–10.3)
Chloride: 101 mmol/L (ref 98–111)
Creatinine, Ser: 1.23 mg/dL (ref 0.61–1.24)
GFR, Estimated: >60 mL/min (ref >60)
Glucose, Bld: 87 mg/dL (ref 70–99)
Potassium: 3.6 mmol/L (ref 3.5–5.1)
Sodium: 139 mmol/L (ref 135–145)
Total Bilirubin: 2 mg/dL — ABNORMAL HIGH (ref 0.0–1.2)
Total Protein: 7.1 g/dL (ref 6.5–8.1)

## 2024-08-27 LAB — CBC WITH DIFFERENTIAL/PLATELET
Abs Immature Granulocytes: 0.1 K/uL — ABNORMAL HIGH (ref 0.00–0.07)
Basophils Absolute: 0.1 K/uL (ref 0.0–0.1)
Basophils Relative: 1 %
Eosinophils Absolute: 0.1 K/uL (ref 0.0–0.5)
Eosinophils Relative: 1 %
HCT: 55.4 % — ABNORMAL HIGH (ref 39.0–52.0)
Hemoglobin: 19.5 g/dL — ABNORMAL HIGH (ref 13.0–17.0)
Immature Granulocytes: 1 %
Lymphocytes Relative: 22 %
Lymphs Abs: 1.7 K/uL (ref 0.7–4.0)
MCH: 33.6 pg (ref 26.0–34.0)
MCHC: 35.2 g/dL (ref 30.0–36.0)
MCV: 95.4 fL (ref 80.0–100.0)
Monocytes Absolute: 0.8 K/uL (ref 0.1–1.0)
Monocytes Relative: 10 %
Neutro Abs: 5.2 K/uL (ref 1.7–7.7)
Neutrophils Relative %: 65 %
Platelets: 246 K/uL (ref 150–400)
RBC: 5.81 MIL/uL (ref 4.22–5.81)
RDW: 12.8 % (ref 11.5–15.5)
WBC: 8 K/uL (ref 4.0–10.5)
nRBC: 0 % (ref 0.0–0.2)

## 2024-08-27 LAB — IRON AND TIBC
Iron: 171 ug/dL (ref 45–182)
Saturation Ratios: 40 % — ABNORMAL HIGH (ref 17.9–39.5)
TIBC: 430 ug/dL (ref 250–450)
UIBC: 259 ug/dL

## 2024-08-27 LAB — FOLATE: Folate: 11.5 ng/mL (ref >5.9)

## 2024-08-27 LAB — FERRITIN: Ferritin: 128 ng/mL (ref 24–336)

## 2024-09-03 ENCOUNTER — Inpatient Hospital Stay: Admitting: Physician Assistant

## 2024-09-03 NOTE — Progress Notes (Unsigned)
 [facility] [address], [place] [address]   CLINIC:  Medical Oncology/Hematology  PCP:  Jolinda Norene HERO, DO [address] [place] [address] [phone]   REASON FOR VISIT:  Follow-up for erythrocytosis  CURRENT THERAPY: Intermittent phlebotomy  INTERVAL HISTORY:   [name] 73 y.o. male returns for routine follow-up of erythrocytosis. He was last seen by Dr. Davonna on 02/24/2024.  He  denies any recent surgeries, hospitalizations, or changes in baseline health status.   At today's visit, he reports feeling fair.  He  reports 40% energy and 75% appetite.   He  is maintaining stable weight at this time.  ERYTHROCYTOSIS: He reports that his brother had bone marrow issues from agent orange exposure. He does not smoke any cigarettes, but does smoke marijuana about 3 times daily for pain management purposes. Reportedly had negative sleep study in 2013. He does not take any diuretic or steroid medication. Medical history includes hypertension, but no other cardiopulmonary disease. He does not take any testosterone  supplements. He has a history of heavy alcohol use (about half a gallon per week), but currently drinks approximately 2 ounces of liquor each evening. He reports drinking plenty of water in the summer, but only drinks about 16 ounces of water daily in the winter, otherwise drinks tea and Sin Drop. He denies any obvious carbon monoxide exposure.  He lives in a log house that is heated with hot water pumped through the home, which is powered by wood stove outside.  No gas appliances.  He denies any history of DVT or PE, but had TIA in 2012. He denies aquagenic pruritus, Raynaud's, and erythromelalgia (itching/burning pain). He has chronic dizziness and tinnitus, which are fairly constant. He denies any blurry vision. He has numbness and tingling in his right arm/hand. No B symptoms.  ASSESSMENT & PLAN:  1.  Erythrocytosis  (secondary polycythemia) - Referred to hematology in November 2024.  Intermittently elevated hemoglobin levels since at least 2011, with peak Hgb 19.5/hematocrit 55.4 in 2025.  Previously evaluated by Dr. Onesimo at [facility], and erythrocytosis was thought to be due to dehydration and alcohol use. - Testing for JAK2, CALR, and MPL was NEGATIVE in 2021.  Available NGS testing at that time was also unremarkable. - EPO levels normal (erythropoietin  5.8 on 08/12/2023) - Patient's brother had bone marrow issues from agent orange exposure.  Patient reports personal exposure to Roundup. - He does not smoke any cigarettes, but smokes marijuana 3 times daily for pain management purposes. - Reportedly had negative sleep study in 2013. - He does not take any testosterone  supplements, diuretics, or steroid medication. - Medical history includes hypertension, but no other cardiopulmonary disease. - History of heavy alcohol use (about half a gallon per week), but currently drinks 2 ounces of liquor daily. - Admits to drinking less than optimal water throughout the day - Denies any obvious carbon monoxide exposure.  He lives in a log house that is heated with hot water pumped through the home, which is powered by wood stove outside.  No gas appliances. - He denies any history of DVT, PE, or MI.  Reports having TIA in 2012. - Received phlebotomy x 1 on 09/13/2023.  Per Dr. Davonna cautiously do phlebotomy if he is symptomatic or if hemoglobin >20.  Caution with phlebotomy is recommended as he also has some iron  deficiency (received Venofer  400 mg x 2 in December 2024, following phlebotomy). - Reports constant dizziness and tinnitus, as  well as numbness/tingling in right arm/hand.  No aquagenic pruritus, erythromelalgia, or B symptoms. - Most recent labs (08/27/2024): Hgb 19.5/hematocrit 55.4. - PLAN: As workup has otherwise been unremarkable, erythrocytosis has been thought to be due to dehydration.  Question  that he may also have some elevated Hgb/HCT due to marijuana smoking.   - As Hgb/HCT continues to increase, we will check additional workup with carbon monoxide and carboxyhemoglobin testing. - Recommend increased water intake of at least 80 ounces daily.  Recheck Hgb/HCT in 1 week, and proceed with phlebotomy x 1 if HCT remains >54.0. - If he continues to have progressive elevations, consider checking BMBx.  (Low-normal EPO does not entirely rule out MPN) - Recommend aspirin  81 mg daily to decrease risk of thrombosis and patient with erythrocytosis and other cardiac risk factors (hypertension) - Patient wishes to follow-up less frequently.  RTC in 6 months with labs.    2.  Iron  deficiency - Patient had low ferritin levels, therefore Dr. Davonna administered IV iron  after phlebotomy - Most recent labs (08/27/2024) with ferritin 128, iron  saturation 40%.  He has normal folate. - PLAN: Monitor and replace as needed, balancing carefully with the fact that iron  repletion may further fuel his erythrocytosis.       3.  Bone pain - Evaluated by Dr. Onesimo in February 2021 for bone pain, which was ultimately determined to likely be related to his scoliosis and prior physical trauma (ongoing pain since fall at work in 2015) - Bone survey from 11/15/2019 did show punctate lucencies throughout the skull, which could represent small vascular lakes although metastatic disease or myeloma cannot be excluded.  No other clear-cut lytic lesions identified. - Normal immunofixation, no M spike noted on 11/08/2019 - As pain was felt to be nonmalignant, he was recommended to follow-up with PCP and pain management specialist - PLAN: Follow-up with PCP and pain management specialist.  4.  Alcohol use - History of significant alcohol use, currently reduced.  Patient uses alcohol for pain management and relaxation. - Patient has been hesitant to take pain medication due to family history of addiction. - PLAN: Monitor  alcohol use and encourage continued reduction.  PLAN SUMMARY: >> Labs next week = CBC, carbon monoxide, carboxyhemoglobin, erythropoietin  >>**POSSIBLE phlebotomy** next week, same day as labs.  (HOLD if HCT <54.0) >> Labs in 6 months = CBC/D, ferritin, iron /TIBC >> OFFICE visit in 6 months (1 week after labs)     REVIEW OF SYSTEMS:   Review of Systems  Constitutional:  Positive for fatigue. Negative for appetite change, chills, diaphoresis, fever and unexpected weight change.  HENT:   Negative for lump/mass and nosebleeds.   Eyes:  Negative for eye problems.  Respiratory:  Negative for cough, hemoptysis and shortness of breath.   Cardiovascular:  Negative for chest pain, leg swelling and palpitations.  Gastrointestinal:  Positive for constipation and diarrhea. Negative for abdominal pain, blood in stool, nausea and vomiting.  Genitourinary:  Negative for hematuria.   Musculoskeletal:  Positive for arthralgias and myalgias.  Skin: Negative.   Neurological:  Negative for dizziness, headaches and light-headedness.  Hematological:  Does not bruise/bleed easily.  Psychiatric/Behavioral:  Positive for sleep disturbance.      PHYSICAL EXAM:  ECOG PERFORMANCE STATUS: 1 - Symptomatic but completely ambulatory  Vitals:   09/04/24 1255 09/04/24 1300  BP: (!) 152/89 (!) 159/91  Pulse: 60   Resp: 18   Temp: 97.9 F (36.6 C)   SpO2: 98%    Filed  Weights   09/04/24 1255  Weight: 181 lb 14.1 oz (82.5 kg)   Physical Exam Constitutional:      Appearance: Normal appearance. He is normal weight.  Cardiovascular:     Heart sounds: Normal heart sounds.  Pulmonary:     Breath sounds: Normal breath sounds.  Neurological:     General: No focal deficit present.     Mental Status: Mental status is at baseline.  Psychiatric:        Behavior: Behavior normal. Behavior is cooperative.     PAST MEDICAL/SURGICAL HISTORY:  Past Medical History:  Diagnosis Date   Acute meniscal tear of left  knee    Allergic rhinitis    Anal fissure    Anxiety    BPH (benign prostatic hypertrophy)    Carpal tunnel syndrome of right wrist    Cervical spondylosis without myelopathy    Cervicogenic headache    Chronic headaches    Chronic neck pain    uses TENS unit  prn   Diverticulitis    Gallstones    GERD (gastroesophageal reflux disease)    H/O hiatal hernia    History of diverticulitis    History of kidney stones    History of TIA (transient ischemic attack)    04/ 2011---  no residuals   Hypertension    Hypothyroidism    Peripheral neuropathy    Shingles    Syrinx of spinal cord (HCC)    C4 -- C7   Past Surgical History:  Procedure Laterality Date   CARDIAC CATHETERIZATION  02-18-2010   dr verlin   normal coronary arteries/  normal lvsf--  ef 65%   COLONOSCOPY     HEMORRHOID SURGERY  1992   HERNIA REPAIR  2018   [facility] [place] [facility]   KNEE ARTHROSCOPY Left 03/13/2014   Procedure: LEFT ARTHROSCOPY KNEE WITH DEBRIDMENT;  Surgeon: Dempsey Melodi GAILS, MD;  Location: [facility];  Service: Orthopedics;  Laterality: Left;   LAPAROSCOPIC CHOLECYSTECTOMY  01/30/2003   NEGATIVE SLEEP STUDY  2013   per pt   RADIOFREQUENCY ABLATION NERVES  05/2016   REMOVAL FORGEIN BODY FROM EAR  AGE 57   RIGHT URETEROSCOPIC LASER LITHOTRIPSY STONE EXTRACTION /  STENT PLACEMENT  05/10/2000   TRANSTHORACIC ECHOCARDIOGRAM  01/16/2010   normal lvf/  ef  60%/  mild lae    SOCIAL HISTORY:  Social History   Socioeconomic History   Marital status: Married    Spouse name: Particia   Number of children: 2   Years of education: 12   Highest education level: 12th grade  Occupational History   Occupation: Retired    Associate Professor: [employer]  Tobacco Use   Smoking status: Never   Smokeless tobacco: Never  Vaping Use   Vaping status: Never Used  Substance and Sexual Activity   Alcohol use: Yes    Alcohol/week: 7.0 standard drinks of alcohol    Types: 7 Shots of liquor per week     Comment: Quit half gallon of liquor per week on 12/09/22. Trying to have 1 beer or less nightly as of 04/18/23   Drug use: Yes    Frequency: 7.0 times per week    Types: Marijuana    Comment: started use after retiring, usually two puffs per day for back pain. Cutting back as of 02/15/23   Sexual activity: Not on file  Other Topics Concern   Not on file  Social History Narrative   Lives at home with wife.  Patient is right handed.   Patient drinks 1-2 cups of caffeine daily.      03/16/2024  does yoga. Very active with family and friends and around home   Social Drivers of Health   Financial Resource Strain: Low Risk  (05/01/2024)   Overall Financial Resource Strain (CARDIA)    Difficulty of Paying Living Expenses: Not very hard  Food Insecurity: No Food Insecurity (05/01/2024)   Hunger Vital Sign    Worried About Running Out of Food in the Last Year: Never true    Ran Out of Food in the Last Year: Never true  Transportation Needs: No Transportation Needs (05/01/2024)   PRAPARE - Administrator, Civil Service (Medical): No    Lack of Transportation (Non-Medical): No  Physical Activity: Insufficiently Active (05/01/2024)   Exercise Vital Sign    Days of Exercise per Week: 3 days    Minutes of Exercise per Session: 20 min  Stress: No Stress Concern Present (05/01/2024)   [employer] of Occupational Health - Occupational Stress Questionnaire    Feeling of Stress: Only a little  Social Connections: Moderately Isolated (05/01/2024)   Social Connection and Isolation Panel    Frequency of Communication with Friends and Family: More than three times a week    Frequency of Social Gatherings with Friends and Family: More than three times a week    Attends Religious Services: Never    Database Administrator or Organizations: No    Attends Engineer, Structural: Not on file    Marital Status: Married  Catering Manager Violence: Not At Risk (03/16/2024)   Humiliation,  Afraid, Rape, and Kick questionnaire    Fear of Current or Ex-Partner: No    Emotionally Abused: No    Physically Abused: No    Sexually Abused: No    FAMILY HISTORY:  Family History  Problem Relation Age of Onset   Heart disease Mother    Prostate cancer Father    Heart disease Father    Stroke Father    Nephrolithiasis Sister    Cancer Sister        Intestinal   Hyperlipidemia Sister    Hyperlipidemia Sister    Cancer Sister        Sinus   Colon polyps Sister    Diabetes Brother    Colon polyps Brother        x2   COPD Other    Colon cancer Neg Hx    Esophageal cancer Neg Hx     CURRENT MEDICATIONS:  Outpatient Encounter Medications as of 09/04/2024  Medication Sig   amLODipine  (NORVASC ) 10 MG tablet Take 1 tablet (10 mg total) by mouth daily.   aspirin  EC 81 MG tablet Take 1 tablet (81 mg total) by mouth daily. Swallow whole.   hydrocortisone  (ANUSOL -HC) 2.5 % rectal cream Place rectally.   levothyroxine  (SYNTHROID ) 150 MCG tablet TAKE 1 TABLET BY MOUTH DAILY  BEFORE BREAKFAST EXCEPT ONE-HALF TABLET BY MOUTH ON Saturday and SUNDAY   losartan  (COZAAR ) 50 MG tablet Take 1 tablet (50 mg total) by mouth daily.   omeprazole  (PRILOSEC) 20 MG capsule TAKE 1 CAPSULE BY MOUTH TWICE  DAILY   psyllium (METAMUCIL) 58.6 % packet Take 1 packet by mouth daily.   zinc gluconate 50 MG tablet Take 50 mg by mouth daily.   No facility-administered encounter medications on file as of 09/04/2024.    ALLERGIES:  Allergies  Allergen Reactions   Cymbalta  [Duloxetine  Hcl] Other (  See Comments)    Depression   Lyrica [Pregabalin] Other (See Comments)    Paranoid   Neurontin [Gabapentin] Other (See Comments)    nightmares   Robaxin  [Methocarbamol ] Other (See Comments)    Seeing bright lights   Sudafed [Pseudoephedrine Hcl] Other (See Comments)    Increased heart rate   Epinephrine Palpitations    Decreased blood pressure    LABORATORY DATA:  I have reviewed the labs as listed.   CBC    Component Value Date/Time   WBC 8.0 08/27/2024 1039   RBC 5.81 08/27/2024 1039   HGB 19.5 (H) 08/27/2024 1039   HGB 18.5 (H) 07/25/2023 0855   HCT 55.4 (H) 08/27/2024 1039   HCT 53.2 (H) 07/25/2023 0855   PLT 246 08/27/2024 1039   PLT 253 07/25/2023 0855   MCV 95.4 08/27/2024 1039   MCV 102 (H) 07/25/2023 0855   MCH 33.6 08/27/2024 1039   MCHC 35.2 08/27/2024 1039   RDW 12.8 08/27/2024 1039   RDW 12.8 07/25/2023 0855   LYMPHSABS 1.7 08/27/2024 1039   LYMPHSABS 1.7 07/25/2023 0855   MONOABS 0.8 08/27/2024 1039   EOSABS 0.1 08/27/2024 1039   EOSABS 0.2 07/25/2023 0855   BASOSABS 0.1 08/27/2024 1039   BASOSABS 0.1 07/25/2023 0855      Latest Ref Rng & Units 08/27/2024   10:39 AM 12/30/2023   12:16 PM 11/17/2023   11:52 AM  CMP  Glucose 70 - 99 mg/dL 87  883  82   BUN 8 - 23 mg/dL 12  11  13    Creatinine 0.61 - 1.24 mg/dL 8.76  8.93  8.81   Sodium 135 - 145 mmol/L 139  139  143   Potassium 3.5 - 5.1 mmol/L 3.6  3.7  3.7   Chloride 98 - 111 mmol/L 101  103  107   CO2 22 - 32 mmol/L 28  29  27    Calcium  8.9 - 10.3 mg/dL 9.2  9.1  9.2   Total Protein 6.5 - 8.1 g/dL 7.1   6.7   Total Bilirubin 0.0 - 1.2 mg/dL 2.0   1.9   Alkaline Phos 38 - 126 U/L 114   88   AST 15 - 41 U/L 21   19   ALT 0 - 44 U/L 31   20     DIAGNOSTIC IMAGING:  I have independently reviewed the relevant imaging and discussed with the patient.   WRAP UP:  All questions were answered. The patient knows to call the clinic with any problems, questions or concerns.  Medical decision making: Moderate  Time spent on visit: I spent 25 minutes counseling the patient face to face. The total time spent in the appointment was 40 minutes and more than 50% was on counseling.  Pleasant CHRISTELLA Barefoot, PA-C  09/04/2024 10:42 PM

## 2024-09-04 ENCOUNTER — Inpatient Hospital Stay: Admitting: Physician Assistant

## 2024-09-04 VITALS — BP 159/91 | HR 60 | Temp 97.9°F | Resp 18 | Wt 181.9 lb

## 2024-09-04 DIAGNOSIS — D751 Secondary polycythemia: Secondary | ICD-10-CM | POA: Diagnosis not present

## 2024-09-04 MED ORDER — ASPIRIN 81 MG PO TBEC: 81.0000 mg | DELAYED_RELEASE_TABLET | Freq: Every day | ORAL | 12 refills | Status: AC

## 2024-09-04 NOTE — Patient Instructions (Addendum)
 [facility] at [facility] **VISIT SUMMARY & IMPORTANT INSTRUCTIONS **   You were seen today by Pleasant Barefoot PA-C for your elevated blood count.    ELEVATED RED BLOOD CELLS High red blood cells can be caused by multiple factors. You do NOT have evidence of these causes of high red blood cells: Sleep apnea Tobacco/cigarette use Medications such as testosterone , diuretics, or steroids Severe heart and lung disease such as COPD or emphysema Possible causes of your high red blood cells includes: Smoking marijuana I recommend that you stop smoking marijuana or any other substances. Dehydration Drink at least 80 ounces of water daily Limit the amount of tea or soda that you drink ?  Possible carbon monoxide exposure ? We will check carbon monoxide levels with your next labs. ? Possible bone marrow disorder ? Previous genetic testing did not show any DNA mutation that would cause high red blood cells. However, in a small subset of patients, primary bone marrow disorders (I.e. bone marrow/blood cancers) can occur without any associated DNA mutation. If your blood counts continue to remain elevated without obvious cause, we may consider bone marrow biopsy in the future. The biggest danger of high red blood cells is that it makes your blood too thick.  This increases your risk of blood clot, heart attack, and stroke.  Seek IMMEDIATE MEDICAL ATTENTION if you notice any of the following: New onset swelling in one leg more than the other, which is usually painful. Sudden onset chest pain, difficulty breathing, or coughing up blood. Sudden onset weakness on one side of your body, occultly speaking, or altered mental status  NEXT STEPS Stop smoking and increase your water intake over the next week. Will recheck your blood levels next week.  We will also check carbon monoxide levels. If your hematocrit (HCT) is still >54.0%, we will schedule you for phlebotomy x 1 START  taking aspirin  81 mg daily (baby aspirin ) to decrease risk of blood clot, heart attack, and stroke. We will check your blood count and see you back for follow-up again in 6 months.  FOLLOW-UP APPOINTMENT: 6 months  ** Thank you for trusting me with your healthcare!  I strive to provide all of my patients with quality care at each visit.  If you receive a survey for this visit, I would be so grateful to you for taking the time to provide feedback.  Thank you in advance!  ~ Zhania Shaheen                                        Dr. Mickiel Davonna Pleasant Barefoot, PA-C          Delon Hope, NP   - - - - - - - - - - - - - - - - - -    Thank you for choosing  [facility] to provide your oncology and hematology care.  To afford each patient quality time with our provider, please arrive at least 15 minutes before your scheduled appointment time.   If you have a lab appointment with the Cancer Center please come in thru the Main Entrance and check in at the main information desk.  You need to re-schedule your appointment should you arrive 10 or more minutes late.  We strive to give you quality time with our  providers, and arriving late affects you and other patients whose appointments are after yours.  Also, if you no show three or more times for appointments you may be dismissed from the clinic at the providers discretion.     Again, thank you for choosing [facility].  Our hope is that these requests will decrease the amount of time that you wait before being seen by our physicians.       _____________________________________________________________  Should you have questions after your visit to [facility], please contact our office at [phone] and follow the prompts.  Our office hours are 8:00 a.m. and 4:30 p.m. Monday - Friday.  Please note that voicemails left after 4:00 p.m. may not be returned until the following business  day.  We are closed weekends and major holidays.  You do have access to a nurse 24-7, just call the main number to the clinic [phone] and do not press any options, hold on the line and a nurse will answer the phone.    For prescription refill requests, have your pharmacy contact our office and allow 72 hours.

## 2024-09-13 ENCOUNTER — Inpatient Hospital Stay

## 2024-09-13 ENCOUNTER — Ambulatory Visit: Payer: Self-pay | Admitting: Physician Assistant

## 2024-09-13 DIAGNOSIS — D751 Secondary polycythemia: Secondary | ICD-10-CM

## 2024-09-13 LAB — CBC
HCT: 46.7 % (ref 39.0–52.0)
Hemoglobin: 16.1 g/dL (ref 13.0–17.0)
MCH: 33.8 pg (ref 26.0–34.0)
MCHC: 34.5 g/dL (ref 30.0–36.0)
MCV: 98.1 fL (ref 80.0–100.0)
Platelets: 198 K/uL (ref 150–400)
RBC: 4.76 MIL/uL (ref 4.22–5.81)
RDW: 12.6 % (ref 11.5–15.5)
WBC: 5.1 K/uL (ref 4.0–10.5)
nRBC: 0 % (ref 0.0–0.2)

## 2024-09-13 LAB — CARBOXYHEMOGLOBIN - COOX: Carboxyhemoglobin: 0.9 % (ref 0.5–1.5)

## 2024-09-13 NOTE — Progress Notes (Signed)
 Patient seen for evaluation of erythrocytosis on 09/04/2024.  At that time, elevated blood count was felt to be possibly related to dehydration and/or smoking of marijuana.  Patient was advised to increase fluid intake and stop smoking marijuana. Repeat CBC/D today (09/13/2024) significantly improved, with resolution of previous abnormalities.  NURSES: Please call patient to let him know that his blood count looks great today!  He should continue to drink 64 to 80 ounces of water daily, and avoid smoking marijuana.  I think that was the root cause of his elevated blood count.  We will recheck labs as scheduled in 6 months to make sure he is still stable.  Pleasant CHRISTELLA Barefoot, PA-C 09/13/2024 2:06 PM

## 2024-09-13 NOTE — Progress Notes (Signed)
 No phlebotomy needed today. Hematocrit was 46.7

## 2024-09-13 NOTE — Progress Notes (Signed)
 Patient notified and verbalized understanding.

## 2024-09-14 LAB — CARBON MONOXIDE, BLOOD (PERFORMED AT REF LAB): Carbon Monoxide, Blood: 2.9 % (ref 0.0–3.6)

## 2024-09-15 LAB — ERYTHROPOIETIN: Erythropoietin: 12 m[IU]/mL (ref 2.6–18.5)

## 2024-09-24 ENCOUNTER — Encounter: Payer: Self-pay | Admitting: *Deleted

## 2025-02-26 ENCOUNTER — Inpatient Hospital Stay

## 2025-03-05 ENCOUNTER — Inpatient Hospital Stay: Admitting: Physician Assistant

## 2025-03-19 ENCOUNTER — Ambulatory Visit: Payer: Self-pay
